# Patient Record
Sex: Female | Born: 1975 | Race: White | Hispanic: No | Marital: Married | Smoking: Current some day smoker
Health system: Southern US, Community
[De-identification: ages and names within clinical notes are randomized; demographics above are authoritative.]

## PROBLEM LIST (undated history)

## (undated) DIAGNOSIS — E119 Type 2 diabetes mellitus without complications: Secondary | ICD-10-CM

## (undated) DIAGNOSIS — Z87898 Personal history of other specified conditions: Secondary | ICD-10-CM

## (undated) DIAGNOSIS — I10 Essential (primary) hypertension: Secondary | ICD-10-CM

## (undated) DIAGNOSIS — B20 Human immunodeficiency virus [HIV] disease: Secondary | ICD-10-CM

## (undated) DIAGNOSIS — R7689 Other specified abnormal immunological findings in serum: Secondary | ICD-10-CM

## (undated) DIAGNOSIS — R519 Headache, unspecified: Secondary | ICD-10-CM

## (undated) DIAGNOSIS — Z21 Asymptomatic human immunodeficiency virus [HIV] infection status: Secondary | ICD-10-CM

## (undated) DIAGNOSIS — F32A Depression, unspecified: Secondary | ICD-10-CM

## (undated) DIAGNOSIS — R768 Other specified abnormal immunological findings in serum: Secondary | ICD-10-CM

## (undated) DIAGNOSIS — F329 Major depressive disorder, single episode, unspecified: Secondary | ICD-10-CM

## (undated) DIAGNOSIS — J3081 Allergic rhinitis due to animal (cat) (dog) hair and dander: Secondary | ICD-10-CM

## (undated) DIAGNOSIS — F419 Anxiety disorder, unspecified: Secondary | ICD-10-CM

## (undated) DIAGNOSIS — Z8632 Personal history of gestational diabetes: Secondary | ICD-10-CM

## (undated) DIAGNOSIS — T8859XA Other complications of anesthesia, initial encounter: Secondary | ICD-10-CM

## (undated) DIAGNOSIS — K589 Irritable bowel syndrome without diarrhea: Secondary | ICD-10-CM

## (undated) DIAGNOSIS — Z87442 Personal history of urinary calculi: Secondary | ICD-10-CM

## (undated) DIAGNOSIS — T7840XA Allergy, unspecified, initial encounter: Secondary | ICD-10-CM

## (undated) DIAGNOSIS — R51 Headache: Secondary | ICD-10-CM

## (undated) DIAGNOSIS — Z720 Tobacco use: Secondary | ICD-10-CM

## (undated) DIAGNOSIS — K056 Periodontal disease, unspecified: Secondary | ICD-10-CM

## (undated) DIAGNOSIS — K219 Gastro-esophageal reflux disease without esophagitis: Secondary | ICD-10-CM

## (undated) DIAGNOSIS — N200 Calculus of kidney: Secondary | ICD-10-CM

## (undated) DIAGNOSIS — K5900 Constipation, unspecified: Secondary | ICD-10-CM

## (undated) DIAGNOSIS — M1990 Unspecified osteoarthritis, unspecified site: Secondary | ICD-10-CM

## (undated) DIAGNOSIS — K8020 Calculus of gallbladder without cholecystitis without obstruction: Secondary | ICD-10-CM

## (undated) HISTORY — DX: Asymptomatic human immunodeficiency virus (hiv) infection status: Z21

## (undated) HISTORY — DX: Calculus of kidney: N20.0

## (undated) HISTORY — DX: Headache: R51

## (undated) HISTORY — DX: Other specified abnormal immunological findings in serum: R76.89

## (undated) HISTORY — DX: Allergic rhinitis due to animal (cat) (dog) hair and dander: J30.81

## (undated) HISTORY — PX: WISDOM TOOTH EXTRACTION: SHX21

## (undated) HISTORY — DX: Type 2 diabetes mellitus without complications: E11.9

## (undated) HISTORY — PX: TUBAL LIGATION: SHX77

## (undated) HISTORY — DX: Human immunodeficiency virus (HIV) disease: B20

## (undated) HISTORY — DX: Allergy, unspecified, initial encounter: T78.40XA

## (undated) HISTORY — DX: Headache, unspecified: R51.9

## (undated) HISTORY — DX: Personal history of other specified conditions: Z87.898

## (undated) HISTORY — DX: Personal history of gestational diabetes: Z86.32

## (undated) HISTORY — DX: Morbid (severe) obesity due to excess calories: E66.01

## (undated) HISTORY — DX: Constipation, unspecified: K59.00

## (undated) HISTORY — PX: INTRAUTERINE DEVICE INSERTION: SHX323

## (undated) HISTORY — DX: Other specified abnormal immunological findings in serum: R76.8

## (undated) HISTORY — PX: ABDOMINAL HYSTERECTOMY: SHX81

## (undated) HISTORY — DX: Tobacco use: Z72.0

## (undated) HISTORY — DX: Periodontal disease, unspecified: K05.6

## (undated) HISTORY — DX: Calculus of gallbladder without cholecystitis without obstruction: K80.20

---

## 2009-04-23 ENCOUNTER — Emergency Department (HOSPITAL_COMMUNITY): Admission: EM | Admit: 2009-04-23 | Discharge: 2009-04-23 | Payer: Self-pay | Admitting: Family Medicine

## 2011-07-22 ENCOUNTER — Encounter: Payer: Self-pay | Admitting: Obstetrics & Gynecology

## 2011-07-22 ENCOUNTER — Ambulatory Visit (INDEPENDENT_AMBULATORY_CARE_PROVIDER_SITE_OTHER): Payer: 59 | Admitting: Obstetrics & Gynecology

## 2011-07-22 VITALS — BP 117/93 | HR 96 | Ht 68.0 in | Wt 309.0 lb

## 2011-07-22 DIAGNOSIS — Z01419 Encounter for gynecological examination (general) (routine) without abnormal findings: Secondary | ICD-10-CM

## 2011-07-22 DIAGNOSIS — E118 Type 2 diabetes mellitus with unspecified complications: Secondary | ICD-10-CM | POA: Insufficient documentation

## 2011-07-22 DIAGNOSIS — Z124 Encounter for screening for malignant neoplasm of cervix: Secondary | ICD-10-CM

## 2011-07-22 DIAGNOSIS — Z113 Encounter for screening for infections with a predominantly sexual mode of transmission: Secondary | ICD-10-CM

## 2011-07-22 DIAGNOSIS — E1169 Type 2 diabetes mellitus with other specified complication: Secondary | ICD-10-CM | POA: Insufficient documentation

## 2011-07-22 DIAGNOSIS — E1165 Type 2 diabetes mellitus with hyperglycemia: Secondary | ICD-10-CM | POA: Insufficient documentation

## 2011-07-22 DIAGNOSIS — Z0000 Encounter for general adult medical examination without abnormal findings: Secondary | ICD-10-CM

## 2011-07-22 NOTE — Progress Notes (Signed)
Subjective:    Cassandra [name] is a 36 y.o. female who presents for an annual exam. The patient has no complaints today.  A recent sexual partner (while she was separated) has told her he has herpes. She would like tested for all STIs. The patient is sexually active. GYN screening history: last pap: was normal. The patient wears seatbelts: yes. The patient participates in regular exercise: no. Has the patient ever been transfused or tattooed?: not asked. The patient reports that there is not domestic violence in her life.   Menstrual History: OB History    Grav Para Term Preterm Abortions TAB SAB Ect Mult Living   2 2        2       Menarche age: 14 Patient's last menstrual period was 06/25/2011.    The following portions of the patient's history were reviewed and updated as appropriate: allergies, current medications, past family history, past medical history, past social history, past surgical history and problem list.  Review of Systems A comprehensive review of systems was negative. She works at [employer]. She reports monthly periods.   Objective:    BP 117/93  Pulse 96  Ht 5\' 8"  (1.727 m)  Wt 309 lb (140.161 kg)  BMI 46.98 kg/m2  LMP 06/25/2011  General Appearance:    Alert, cooperative, no distress, appears stated age  Head:    Normocephalic, without obvious abnormality, atraumatic  Eyes:    PERRL, conjunctiva/corneas clear, EOM's intact, fundi    benign, both eyes  Ears:    Normal TM's and external ear canals, both ears  Nose:   Nares normal, septum midline, mucosa normal, no drainage    or sinus tenderness  Throat:   Lips, mucosa, and tongue normal; teeth and gums normal  Neck:   Supple, symmetrical, trachea midline, no adenopathy;    thyroid:  no enlargement/tenderness/nodules; no carotid   bruit or JVD  Back:     Symmetric, no curvature, ROM normal, no CVA tenderness  Lungs:     Clear to auscultation bilaterally, respirations unlabored  Chest Wall:    No tenderness or  deformity   Heart:    Regular rate and rhythm, S1 and S2 normal, no murmur, rub   or gallop  Breast Exam:    No tenderness, masses, or nipple abnormality  Abdomen:     Soft, non-tender, bowel sounds active all four quadrants,    no masses, no organomegaly  Genitalia:    Normal female without lesion, discharge or tenderness, NSSA, NT, no adnexal masses     Extremities:   Extremities normal, atraumatic, no cyanosis or edema  Pulses:   2+ and symmetric all extremities  Skin:   Skin color, texture, turgor normal, no rashes or lesions  Lymph nodes:   Cervical, supraclavicular, and axillary nodes normal  Neurologic:   CNII-XII intact, normal strength, sensation and reflexes    throughout  .    Assessment:    Healthy female exam.    Plan:     Pap smear.  She has agreed to lose 10 # by next annual visit and cut her cigarettes down to 3 cig per day by then. I will get fasting labs tomorrow plus her STI labs.

## 2011-07-23 ENCOUNTER — Other Ambulatory Visit (INDEPENDENT_AMBULATORY_CARE_PROVIDER_SITE_OTHER): Payer: 59 | Admitting: *Deleted

## 2011-07-23 DIAGNOSIS — Z13220 Encounter for screening for lipoid disorders: Secondary | ICD-10-CM

## 2011-07-23 DIAGNOSIS — Z113 Encounter for screening for infections with a predominantly sexual mode of transmission: Secondary | ICD-10-CM

## 2011-07-23 DIAGNOSIS — Z01419 Encounter for gynecological examination (general) (routine) without abnormal findings: Secondary | ICD-10-CM

## 2011-07-23 DIAGNOSIS — E669 Obesity, unspecified: Secondary | ICD-10-CM

## 2011-07-24 LAB — HEPATITIS C ANTIBODY: HCV Ab: NEGATIVE

## 2011-07-24 LAB — CBC WITH DIFFERENTIAL/PLATELET
Basophils Absolute: 0 10*3/uL (ref 0.0–0.1)
Basophils Relative: 0 % (ref 0–1)
Eosinophils Absolute: 0.2 10*3/uL (ref 0.0–0.7)
Eosinophils Relative: 2 % (ref 0–5)
HCT: 40.3 % (ref 36.0–46.0)
Hemoglobin: 12.7 g/dL (ref 12.0–15.0)
Lymphocytes Relative: 33 % (ref 12–46)
Lymphs Abs: 2.2 10*3/uL (ref 0.7–4.0)
MCH: 30 pg (ref 26.0–34.0)
MCHC: 31.5 g/dL (ref 30.0–36.0)
MCV: 95 fL (ref 78.0–100.0)
Monocytes Absolute: 0.5 10*3/uL (ref 0.1–1.0)
Monocytes Relative: 8 % (ref 3–12)
Neutro Abs: 3.7 10*3/uL (ref 1.7–7.7)
Neutrophils Relative %: 56 % (ref 43–77)
Platelets: 310 10*3/uL (ref 150–400)
RBC: 4.24 MIL/uL (ref 3.87–5.11)
RDW: 12.4 % (ref 11.5–15.5)
WBC: 6.6 10*3/uL (ref 4.0–10.5)

## 2011-07-24 LAB — HEPATITIS B SURFACE ANTIGEN: Hepatitis B Surface Ag: NEGATIVE

## 2011-07-24 LAB — LIPID PANEL
Cholesterol: 168 mg/dL (ref 0–200)
HDL: 43 mg/dL (ref >39)
LDL Cholesterol: 103 mg/dL — ABNORMAL HIGH (ref 0–99)
Total CHOL/HDL Ratio: 3.9 Ratio
Triglycerides: 109 mg/dL (ref <150)
VLDL: 22 mg/dL (ref 0–40)

## 2011-07-24 LAB — COMPREHENSIVE METABOLIC PANEL
ALT: 14 U/L (ref 0–35)
AST: 19 U/L (ref 0–37)
Albumin: 4 g/dL (ref 3.5–5.2)
Alkaline Phosphatase: 62 U/L (ref 39–117)
BUN: 13 mg/dL (ref 6–23)
CO2: 26 mEq/L (ref 19–32)
Calcium: 8.9 mg/dL (ref 8.4–10.5)
Chloride: 105 mEq/L (ref 96–112)
Creat: 0.79 mg/dL (ref 0.50–1.10)
Glucose, Bld: 107 mg/dL — ABNORMAL HIGH (ref 70–99)
Potassium: 3.9 mEq/L (ref 3.5–5.3)
Sodium: 140 mEq/L (ref 135–145)
Total Bilirubin: 0.8 mg/dL (ref 0.3–1.2)
Total Protein: 6.7 g/dL (ref 6.0–8.3)

## 2011-07-24 LAB — HIV ANTIBODY (ROUTINE TESTING W REFLEX): HIV: NONREACTIVE

## 2011-07-24 LAB — RPR

## 2011-07-24 LAB — TSH: TSH: 1.449 u[IU]/mL (ref 0.350–4.500)

## 2011-07-24 LAB — HSV 2 ANTIBODY, IGG: HSV 2 Glycoprotein G Ab, IgG: 2.77 IV — ABNORMAL HIGH

## 2011-07-27 ENCOUNTER — Telehealth: Payer: Self-pay | Admitting: *Deleted

## 2011-07-27 NOTE — Telephone Encounter (Signed)
Patient is notified of her results.

## 2011-08-04 ENCOUNTER — Telehealth: Payer: Self-pay | Admitting: *Deleted

## 2011-08-04 MED ORDER — METRONIDAZOLE 500 MG PO TABS: 500.0000 mg | ORAL_TABLET | Freq: Three times a day (TID) | ORAL | Status: AC

## 2011-08-04 NOTE — Telephone Encounter (Signed)
Message copied by Barbara Cower on Tue Aug 04, 2011  2:41 PM ------      Message from: Nicholaus Bloom C      Created: Wed Jul 29, 2011  2:57 PM       She and partner need treatment for trich with flagyl 500 mg po TID for a week.

## 2011-08-07 DIAGNOSIS — K056 Periodontal disease, unspecified: Secondary | ICD-10-CM | POA: Insufficient documentation

## 2011-08-07 HISTORY — DX: Periodontal disease, unspecified: K05.6

## 2011-09-02 ENCOUNTER — Ambulatory Visit (INDEPENDENT_AMBULATORY_CARE_PROVIDER_SITE_OTHER): Payer: 59 | Admitting: Family Medicine

## 2011-09-02 ENCOUNTER — Encounter: Payer: Self-pay | Admitting: Family Medicine

## 2011-09-02 VITALS — BP 144/98 | HR 88 | Temp 98.6°F | Ht 69.0 in | Wt 314.5 lb

## 2011-09-02 DIAGNOSIS — F39 Unspecified mood [affective] disorder: Secondary | ICD-10-CM

## 2011-09-02 DIAGNOSIS — F331 Major depressive disorder, recurrent, moderate: Secondary | ICD-10-CM | POA: Insufficient documentation

## 2011-09-02 DIAGNOSIS — K219 Gastro-esophageal reflux disease without esophagitis: Secondary | ICD-10-CM

## 2011-09-02 DIAGNOSIS — Z87891 Personal history of nicotine dependence: Secondary | ICD-10-CM | POA: Insufficient documentation

## 2011-09-02 DIAGNOSIS — R7309 Other abnormal glucose: Secondary | ICD-10-CM

## 2011-09-02 DIAGNOSIS — J3081 Allergic rhinitis due to animal (cat) (dog) hair and dander: Secondary | ICD-10-CM

## 2011-09-02 DIAGNOSIS — F17200 Nicotine dependence, unspecified, uncomplicated: Secondary | ICD-10-CM

## 2011-09-02 DIAGNOSIS — J3089 Other allergic rhinitis: Secondary | ICD-10-CM

## 2011-09-02 DIAGNOSIS — R739 Hyperglycemia, unspecified: Secondary | ICD-10-CM

## 2011-09-02 DIAGNOSIS — Z6841 Body Mass Index (BMI) 40.0 and over, adult: Secondary | ICD-10-CM | POA: Insufficient documentation

## 2011-09-02 DIAGNOSIS — K069 Disorder of gingiva and edentulous alveolar ridge, unspecified: Secondary | ICD-10-CM

## 2011-09-02 DIAGNOSIS — B360 Pityriasis versicolor: Secondary | ICD-10-CM | POA: Insufficient documentation

## 2011-09-02 DIAGNOSIS — Z720 Tobacco use: Secondary | ICD-10-CM

## 2011-09-02 DIAGNOSIS — K056 Periodontal disease, unspecified: Secondary | ICD-10-CM

## 2011-09-02 DIAGNOSIS — R21 Rash and other nonspecific skin eruption: Secondary | ICD-10-CM

## 2011-09-02 LAB — HEMOGLOBIN A1C: Hgb A1c MFr Bld: 5.5 % (ref 4.6–6.5)

## 2011-09-02 MED ORDER — ALBUTEROL SULFATE HFA 108 (90 BASE) MCG/ACT IN AERS: 2.0000 | INHALATION_SPRAY | Freq: Four times a day (QID) | RESPIRATORY_TRACT | Status: DC | PRN

## 2011-09-02 MED ORDER — FLUCONAZOLE 150 MG PO TABS: 300.0000 mg | ORAL_TABLET | ORAL | Status: AC

## 2011-09-02 NOTE — Patient Instructions (Addendum)
For heartburn, try zantac or pepcid over the counter.  If not better, return to see me. Pass by Marion's office to schedule appointment with Dr. Laymond Purser (our psychologist). A1c today. I sent albuterol to your pharmacy - use as needed. For skin rash - take dfilucan 300mg  weekly for 2 weeks.  If not better after a few months let us know. Return as needed. Good to see you today, call us with questions.

## 2011-09-02 NOTE — Assessment & Plan Note (Signed)
Continue to encourage cessation.   Down to 5cig/day. Contemplative.

## 2011-09-02 NOTE — Assessment & Plan Note (Signed)
rec try H2 blockers.  If not improved, return for further evaluation.  Consider H pylori check and trial of PPI.

## 2011-09-02 NOTE — Assessment & Plan Note (Addendum)
No anhedonia.  major depression vs dysthymia. Hold on pharmacotherapy currently. PHQ9 = 18/27, very difficult to function in day to day activities. rec referral for CBT to work on healthy coping strategies for stress as well as to further discuss stressors.  Pt agrees. Will refer to Dr. Laymond Purser for this.

## 2011-09-02 NOTE — Assessment & Plan Note (Signed)
Body mass index is 46.44 kg/(m^2). Encouraged continued activity to achieve weight loss.

## 2011-09-02 NOTE — Progress Notes (Signed)
Subjective:    Patient ID: Cassandra [name], female    DOB: Apr 03, 1976, 36 y.o.   MRN: [id number]  HPI CC: new pt establish  Rash - skin rash over last year - not itchy or painful.  Worse in summer (more pronounced).  On AV fossa bilateral arms, chest, below breasts.  Notes that when uses lotion seems to improve temporarily.  Heartburn - takes tums for this.  Occasional vomiting with this, sometimes feels bloated in abd.  Tried prilosec in past which helped some.  Mood issues - has been going on for years.  has never seeked counseling.  Doesn't have healthy ways of coping with stress.  Some sleep disturbances.  Denies anhedonia.  Energy level down.  Concentration ok.  Some guilt.  Appetite up and down.  Denies SI/HI.  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  Poor eating habits.  Tries to stay active at work some.  Smoking - 5 cig/day.  Preventative: Well woman with Dr. Marice Potter - 07/2011.  Normal pap smear.  Treated for trich. Flu - did not receive Tetanus - unsure - thinks ~2005.  Medications and allergies reviewed and updated in chart.  Past histories reviewed and updated if relevant as below. Patient Active Problem List  Diagnoses  . Diabetes in pregnancy  . Mood disorder  . Skin rash   Past Medical History  Diagnosis Date  . Generalized headaches     frequent  . Gestational diabetes   . History of abnormal Pap smear     remote  . Morbid obesity   . Herpes simplex type 2 infection   . Trichomonas     treated  . Cat allergies     and dogs  . Tobacco use   . Periodontal disease 08/2011    currently getting dental work   Past Surgical History  Procedure Date  . Cesarean section [id number]    x2  . Wisdom tooth extraction     x 4   History  Substance Use Topics  . Smoking status: Current Everyday Smoker -- 0.3 packs/day    Types: Cigarettes  . Smokeless tobacco: Never Used   Comment: ~ 5 cigarettes daily  . Alcohol Use: Yes     rare   Family History  Problem  Relation Age of Onset  . Hypertension Mother   . Hypertension Father   . Diabetes Maternal Grandmother     s/p amputation  . Hyperlipidemia Maternal Grandmother   . Hypertension Maternal Grandmother   . Stroke Maternal Grandmother   . Cancer Maternal Grandfather     skin cancer  . Hyperlipidemia Maternal Grandfather   . Hypertension Maternal Grandfather   . Seizures Paternal Grandmother 34    deceased  . Coronary artery disease Paternal Grandfather 35   No Known Allergies Current Outpatient Prescriptions on File Prior to Visit  Medication Sig Dispense Refill  . Fexofenadine HCl (ALLEGRA PO) Take by mouth.      . Loratadine (CLARITIN PO) Take by mouth.         Review of Systems  Constitutional: Negative for fever, chills, activity change, appetite change, fatigue and unexpected weight change.  HENT: Negative for hearing loss and neck pain.   Eyes: Negative for visual disturbance.  Respiratory: Negative for cough, chest tightness, shortness of breath and wheezing.   Cardiovascular: Negative for chest pain, palpitations and leg swelling.  Gastrointestinal: Positive for constipation. Negative for nausea, vomiting, abdominal pain, diarrhea and blood in stool.  Genitourinary: Negative for hematuria  and difficulty urinating.  Musculoskeletal: Negative for myalgias and arthralgias.  Skin: Negative for rash.  Neurological: Positive for headaches. Negative for dizziness, seizures and syncope.  Hematological: Does not bruise/bleed easily.  Psychiatric/Behavioral: Negative for dysphoric mood. The patient is not nervous/anxious.        Objective:   Physical Exam  Nursing note and vitals reviewed. Constitutional: She is oriented to person, place, and time. She appears well-developed and well-nourished. No distress.       obese  HENT:  Head: Normocephalic and atraumatic.  Right Ear: External ear normal.  Left Ear: External ear normal.  Nose: Nose normal.  Mouth/Throat: Oropharynx is  clear and moist.  Eyes: Conjunctivae and EOM are normal. Pupils are equal, round, and reactive to light.  Neck: Normal range of motion. Neck supple. No thyromegaly present.  Cardiovascular: Normal rate, regular rhythm, normal heart sounds and intact distal pulses.   No murmur heard. Pulses:      Radial pulses are 2+ on the right side, and 2+ on the left side.  Pulmonary/Chest: Effort normal and breath sounds normal. No respiratory distress. She has no wheezes. She has no rales.  Abdominal: Soft. Bowel sounds are normal. She exhibits no distension and no mass. There is no tenderness. There is no rebound and no guarding.  Musculoskeletal: Normal range of motion.  Lymphadenopathy:    She has no cervical adenopathy.  Neurological: She is alert and oriented to person, place, and time.       CN grossly intact, station and gait intact  Skin: Skin is warm and dry. Rash noted.          Hyperpigmented macules on AC fossa and below breasts bilaterally Erythematous pigmented macules anterior chest  Psychiatric: Her behavior is normal. Judgment and thought content normal.       Easily tearful with discussion of stressors.       Assessment & Plan:

## 2011-09-02 NOTE — Assessment & Plan Note (Addendum)
Anticipate 2 separate issues: 1. Tinea versicolor - treat with diflucan 300mg  weekly x2 wks. 2. Acanthosis nigricans - check A1c today.  hyperglycemia last fasting check 07/2011.

## 2011-09-23 ENCOUNTER — Ambulatory Visit (INDEPENDENT_AMBULATORY_CARE_PROVIDER_SITE_OTHER): Payer: PRIVATE HEALTH INSURANCE | Admitting: Psychology

## 2011-09-23 DIAGNOSIS — F331 Major depressive disorder, recurrent, moderate: Secondary | ICD-10-CM

## 2011-10-14 ENCOUNTER — Ambulatory Visit (INDEPENDENT_AMBULATORY_CARE_PROVIDER_SITE_OTHER): Payer: PRIVATE HEALTH INSURANCE | Admitting: Psychology

## 2011-10-14 DIAGNOSIS — F331 Major depressive disorder, recurrent, moderate: Secondary | ICD-10-CM

## 2011-10-20 ENCOUNTER — Ambulatory Visit: Payer: PRIVATE HEALTH INSURANCE | Admitting: Psychology

## 2011-10-21 ENCOUNTER — Ambulatory Visit (INDEPENDENT_AMBULATORY_CARE_PROVIDER_SITE_OTHER): Payer: PRIVATE HEALTH INSURANCE | Admitting: Psychology

## 2011-10-21 DIAGNOSIS — F331 Major depressive disorder, recurrent, moderate: Secondary | ICD-10-CM

## 2011-11-11 ENCOUNTER — Ambulatory Visit (INDEPENDENT_AMBULATORY_CARE_PROVIDER_SITE_OTHER): Payer: PRIVATE HEALTH INSURANCE | Admitting: Psychology

## 2011-11-11 DIAGNOSIS — F331 Major depressive disorder, recurrent, moderate: Secondary | ICD-10-CM

## 2011-12-02 ENCOUNTER — Ambulatory Visit (INDEPENDENT_AMBULATORY_CARE_PROVIDER_SITE_OTHER): Payer: PRIVATE HEALTH INSURANCE | Admitting: Psychology

## 2011-12-02 DIAGNOSIS — F331 Major depressive disorder, recurrent, moderate: Secondary | ICD-10-CM

## 2011-12-16 ENCOUNTER — Ambulatory Visit (INDEPENDENT_AMBULATORY_CARE_PROVIDER_SITE_OTHER): Payer: PRIVATE HEALTH INSURANCE | Admitting: Psychology

## 2011-12-16 DIAGNOSIS — F331 Major depressive disorder, recurrent, moderate: Secondary | ICD-10-CM

## 2011-12-30 ENCOUNTER — Ambulatory Visit (INDEPENDENT_AMBULATORY_CARE_PROVIDER_SITE_OTHER): Payer: PRIVATE HEALTH INSURANCE | Admitting: Psychology

## 2011-12-30 DIAGNOSIS — F331 Major depressive disorder, recurrent, moderate: Secondary | ICD-10-CM

## 2012-01-01 ENCOUNTER — Ambulatory Visit (INDEPENDENT_AMBULATORY_CARE_PROVIDER_SITE_OTHER): Payer: PRIVATE HEALTH INSURANCE | Admitting: Family Medicine

## 2012-01-01 ENCOUNTER — Encounter: Payer: Self-pay | Admitting: Family Medicine

## 2012-01-01 VITALS — BP 120/80 | HR 87 | Temp 98.0°F | Wt 318.0 lb

## 2012-01-01 DIAGNOSIS — Z20828 Contact with and (suspected) exposure to other viral communicable diseases: Secondary | ICD-10-CM

## 2012-01-01 DIAGNOSIS — Z206 Contact with and (suspected) exposure to human immunodeficiency virus [HIV]: Secondary | ICD-10-CM

## 2012-01-01 DIAGNOSIS — I872 Venous insufficiency (chronic) (peripheral): Secondary | ICD-10-CM | POA: Insufficient documentation

## 2012-01-01 DIAGNOSIS — I8390 Asymptomatic varicose veins of unspecified lower extremity: Secondary | ICD-10-CM

## 2012-01-01 NOTE — Progress Notes (Signed)
Subjective:    Patient ID: Cassandra [name], female    DOB: 01-Sep-1975, 36 y.o.   MRN: [id number]  HPI CC: HIV testing  Smoking - still about 5 cig/day.  Varicose veins - would like compression stockings.  Present since last child.  Last seen 08/2011.  After seen here in March, husband tested positive for HIV.  He is seeing Dr. Leavy Cella.  Taking atripla, nondetectable level of viral load.  CD4 from 100s to 300s.  Currently not sexually active with husband.  Last unprotected sex end of February.  Pt tested for STDs in February at [facility], negative HIV test.  HSV2 positive.  Married for 14 yrs.  Husband in bad wreck 1995, lots of transfusions.  Thinks got it here.  Denies fevers/chills, new rashes, vag discharge or bleeding, urethral discharge.    Medications and allergies reviewed and updated in chart.  Past histories reviewed and updated if relevant as below. Patient Active Problem List  Diagnosis  . Diabetes in pregnancy  . Mood disorder  . Skin rash  . Morbid obesity  . Tobacco use  . Periodontal disease  . Cat allergies  . Reflux   Past Medical History  Diagnosis Date  . Generalized headaches     frequent  . History of gestational diabetes     first 2 pregnancies  . History of abnormal Pap smear     remote  . Morbid obesity   . HSV-2 seropositive   . Cat allergies     and dogs  . Tobacco use   . Periodontal disease 08/2011    currently getting dental work   Past Surgical History  Procedure Date  . Cesarean section [phone]    x2  . Wisdom tooth extraction     x 4   History  Substance Use Topics  . Smoking status: Current Everyday Smoker -- 0.3 packs/day    Types: Cigarettes  . Smokeless tobacco: Never Used   Comment: ~ 5 cigarettes daily  . Alcohol Use: Yes     rare   Family History  Problem Relation Age of Onset  . Hypertension Mother   . Hypertension Father   . Diabetes Maternal Grandmother     s/p amputation  . Hyperlipidemia Maternal  Grandmother   . Hypertension Maternal Grandmother   . Stroke Maternal Grandmother   . Cancer Maternal Grandfather     skin cancer  . Hyperlipidemia Maternal Grandfather   . Hypertension Maternal Grandfather   . Seizures Paternal Grandmother 34    deceased  . Coronary artery disease Paternal Grandfather 35   No Known Allergies Current Outpatient Prescriptions on File Prior to Visit  Medication Sig Dispense Refill  . albuterol (VENTOLIN HFA) 108 (90 BASE) MCG/ACT inhaler Inhale 2 puffs into the lungs every 6 (six) hours as needed for wheezing.  1 Inhaler  3  . Fexofenadine HCl (ALLEGRA PO) Take by mouth.      . Loratadine (CLARITIN PO) Take by mouth.      . Multiple Vitamins-Minerals (MULTIVITAMIN PO) Take 1 tablet by mouth daily.      Marland Kitchen omeprazole (PRILOSEC OTC) 20 MG tablet Take 20 mg by mouth 2 (two) times daily.         Review of Systems Per HPI    Objective:   Physical Exam  Nursing note and vitals reviewed. Constitutional: She appears well-developed and well-nourished. No distress.       obese  Skin: Skin is warm and dry. No rash noted.  Varicose veins present with small spider veins surrounding bilateral upper legs      Assessment & Plan:

## 2012-01-01 NOTE — Assessment & Plan Note (Signed)
husband dx this year with HIV.  Pt wants testing.  Discussed usually best time to test is 6 mo after last known exposure. Consider retesting at [facility]. Ok to leave message on her phone (in pt instructions.)

## 2012-01-01 NOTE — Patient Instructions (Signed)
We will call you at [phone] and may leave voicemail with results. Try compression stockings for varicose veins.  Let us know if not helping, may refer to vein doctor.  Varicose Veins Varicose veins are veins that have become enlarged and twisted. CAUSES This condition is the result of valves in the veins not working properly. Valves in the veins help return blood from the leg to the heart. If these valves are damaged, blood flows backwards and backs up into the veins in the leg near the skin. This causes the veins to become larger. People who are on their feet a lot, who are pregnant, or who are overweight are more likely to develop varicose veins. SYMPTOMS   Bulging, twisted-appearing, bluish veins, most commonly found on the legs.   Leg pain or a feeling of heaviness. These symptoms may be worse at the end of the day.   Leg swelling.   Skin color changes.  DIAGNOSIS  Varicose veins can usually be diagnosed with an exam of your legs by your caregiver. He or she may recommend an ultrasound of your leg veins. TREATMENT  Most varicose veins can be treated at home.However, other treatments are available for people who have persistent symptoms or who want to treat the cosmetic appearance of the varicose veins. These include:  Laser treatment of very small varicose veins.   Medicine that is shot (injected) into the vein. This medicine hardens the walls of the vein and closes off the vein. This treatment is called sclerotherapy. Afterwards, you may need to wear clothing or bandages that apply pressure.   Surgery.  HOME CARE INSTRUCTIONS   Do not stand or sit in one position for long periods of time. Do not sit with your legs crossed. Rest with your legs raised during the day.   Wear elastic stockings or support hose. Do not wear other tight, encircling garments around the legs, pelvis, or waist.   Walk as much as possible to increase blood flow.   Raise the foot of your bed at night  with 2-inch blocks.   If you get a cut in the skin over the vein and the vein bleeds, lie down with your leg raised and press on it with a clean cloth until the bleeding stops. Then place a bandage (dressing) on the cut. See your caregiver if it continues to bleed or needs stitches.  SEEK MEDICAL CARE IF:   The skin around your ankle starts to break down.   You have pain, redness, tenderness, or hard swelling developing in your leg over a vein.   You are uncomfortable due to leg pain.  Document Released: 03/04/2005 Document Revised: 05/14/2011 Document Reviewed: 07/21/2010 [facility] Patient Information 2012 [place], [place].

## 2012-01-01 NOTE — Assessment & Plan Note (Signed)
Provided with script for 15-62mmHg compression stockings. To update if worsening, consider [facility] referral.

## 2012-01-02 LAB — HIV ANTIBODY (ROUTINE TESTING W REFLEX): HIV: REACTIVE

## 2012-01-04 ENCOUNTER — Other Ambulatory Visit: Payer: Self-pay | Admitting: Family Medicine

## 2012-01-04 DIAGNOSIS — B20 Human immunodeficiency virus [HIV] disease: Secondary | ICD-10-CM | POA: Insufficient documentation

## 2012-01-05 ENCOUNTER — Telehealth: Payer: Self-pay | Admitting: *Deleted

## 2012-01-05 ENCOUNTER — Telehealth: Payer: Self-pay

## 2012-01-05 ENCOUNTER — Other Ambulatory Visit: Payer: Self-pay | Admitting: Licensed Clinical Social Worker

## 2012-01-05 NOTE — Telephone Encounter (Signed)
Pt informed primary care physician she would like to see Dr Blocker for her HIV care.    Laurell Josephs, RN

## 2012-01-05 NOTE — Telephone Encounter (Signed)
Spoke with Tammy from ID. She said the labs that you ordered are not necessary prior to being seen by ID. She said some of them are what they will draw, but some of them are not and that it would be better to cancel them and let them handle the lab orders once they get her established there. They are also waiting on the [place] blot before scheduling appt with Shirlee Limerick and patient. Do you want me to go ahead and cancel her lab appt tomorrow or keep her on the schedule?

## 2012-01-05 NOTE — Telephone Encounter (Signed)
May cancel appointment.

## 2012-01-05 NOTE — Telephone Encounter (Signed)
I spoke with Dr. Nicanor Alcon nurse. We must await Western Blot prior to making referral to ID official.  Dr Sharen Hones has entered future labs for patient, I have asked for those labs to be cancelled since we will draw our specialty labs at the intake visit. This will avoid duplication of services and additional cost to patient.   Once the Western Blot returns as positive, I will call the patient and set up new 042 intake and office visit with ID physician.    Shirlee Limerick has entered the referral in the work queue which I will accept upon scheduling the patient for visit.    Gadiel John K Johnston Maddocks,RN

## 2012-01-05 NOTE — Telephone Encounter (Signed)
Appt cancelled. Patient requested [facility] appt with Dr. Leavy Cella in [place] for convenience purposes. Her husband goes there and it is closer to home. Marion notified and will cancel [facility] appt.

## 2012-01-06 ENCOUNTER — Other Ambulatory Visit: Payer: PRIVATE HEALTH INSURANCE

## 2012-01-07 LAB — HIV 1/2 CONFIRMATION
HIV-1 antibody: POSITIVE — AB
HIV-2 Ab: NEGATIVE

## 2012-01-13 ENCOUNTER — Ambulatory Visit (INDEPENDENT_AMBULATORY_CARE_PROVIDER_SITE_OTHER): Payer: PRIVATE HEALTH INSURANCE | Admitting: Psychology

## 2012-01-13 ENCOUNTER — Telehealth: Payer: Self-pay | Admitting: *Deleted

## 2012-01-13 DIAGNOSIS — F331 Major depressive disorder, recurrent, moderate: Secondary | ICD-10-CM

## 2012-01-13 NOTE — Telephone Encounter (Signed)
Noted. Thanks.  I did review Dr. Sharrell Ku note as well.

## 2012-01-13 NOTE — Telephone Encounter (Signed)
Patient called to let you know she saw Dr. Leavy Cella 2 days ago and was started on Atripla.

## 2012-01-27 ENCOUNTER — Ambulatory Visit (INDEPENDENT_AMBULATORY_CARE_PROVIDER_SITE_OTHER): Payer: PRIVATE HEALTH INSURANCE | Admitting: Psychology

## 2012-01-27 DIAGNOSIS — F331 Major depressive disorder, recurrent, moderate: Secondary | ICD-10-CM

## 2012-02-10 ENCOUNTER — Ambulatory Visit (INDEPENDENT_AMBULATORY_CARE_PROVIDER_SITE_OTHER): Payer: PRIVATE HEALTH INSURANCE | Admitting: Psychology

## 2012-02-10 DIAGNOSIS — F331 Major depressive disorder, recurrent, moderate: Secondary | ICD-10-CM

## 2012-03-02 ENCOUNTER — Ambulatory Visit: Payer: Self-pay | Admitting: Psychology

## 2012-04-06 ENCOUNTER — Encounter: Payer: Self-pay | Admitting: Family Medicine

## 2012-04-06 ENCOUNTER — Ambulatory Visit (INDEPENDENT_AMBULATORY_CARE_PROVIDER_SITE_OTHER): Payer: 59 | Admitting: Family Medicine

## 2012-04-06 ENCOUNTER — Ambulatory Visit (INDEPENDENT_AMBULATORY_CARE_PROVIDER_SITE_OTHER): Payer: PRIVATE HEALTH INSURANCE | Admitting: Psychology

## 2012-04-06 VITALS — BP 142/98 | HR 84 | Temp 97.9°F | Wt 313.8 lb

## 2012-04-06 DIAGNOSIS — F17200 Nicotine dependence, unspecified, uncomplicated: Secondary | ICD-10-CM

## 2012-04-06 DIAGNOSIS — J019 Acute sinusitis, unspecified: Secondary | ICD-10-CM

## 2012-04-06 DIAGNOSIS — Z720 Tobacco use: Secondary | ICD-10-CM

## 2012-04-06 DIAGNOSIS — B360 Pityriasis versicolor: Secondary | ICD-10-CM

## 2012-04-06 DIAGNOSIS — F331 Major depressive disorder, recurrent, moderate: Secondary | ICD-10-CM

## 2012-04-06 DIAGNOSIS — B20 Human immunodeficiency virus [HIV] disease: Secondary | ICD-10-CM

## 2012-04-06 DIAGNOSIS — Z21 Asymptomatic human immunodeficiency virus [HIV] infection status: Secondary | ICD-10-CM

## 2012-04-06 MED ORDER — AZITHROMYCIN 250 MG PO TABS: ORAL_TABLET | ORAL | Status: DC

## 2012-04-06 MED ORDER — FLUCONAZOLE 150 MG PO TABS: 300.0000 mg | ORAL_TABLET | ORAL | Status: DC

## 2012-04-06 MED ORDER — ALBUTEROL SULFATE HFA 108 (90 BASE) MCG/ACT IN AERS: 2.0000 | INHALATION_SPRAY | Freq: Four times a day (QID) | RESPIRATORY_TRACT | Status: DC | PRN

## 2012-04-06 NOTE — Assessment & Plan Note (Signed)
Treat with diflucan 300mg  weekly for 2 weeks.  Discussed use of antifungal shampoo as body wash once every few weeks to prevent recurrence.

## 2012-04-06 NOTE — Assessment & Plan Note (Signed)
Great numbers as of last check this month.

## 2012-04-06 NOTE — Patient Instructions (Addendum)
Let's keep an eye on blood pressure - keep track of it at local store.  If blood pressure staying >140/90, please let me know For tinea versicolor - treat with diflucan.  Then start antifungal shampoo as body wash once every 2-4 weeks to help prevent recurrence For upper respiratory infection - this may still be viral or possibly a developing sinusitis.  Treat with plenty of fluid, rest, continue tussin.  If not improving as expected or persistent past end of week, or worsening productive cough, fill antibiotic. Let us know if not improving as expected.  Tinea Versicolor Tinea versicolor is a common yeast infection of the skin. This condition becomes known when the yeast on our skin starts to overgrow (yeast is a normal inhabitant on our skin). This condition is noticed as white or light brown patches on brown skin, and is more evident in the summer on tanned skin. These areas are slightly scaly if scratched. The light patches from the yeast become evident when the yeast creates "holes in your suntan". This is most often noticed in the summer. The patches are usually located on the chest, back, pubis, neck and body folds. However, it may occur on any area of body. Mild itching and inflammation (redness or soreness) may be present. DIAGNOSIS  The diagnosisof this is made clinically (by looking). Cultures from samples are usually not needed. Examination under the microscope may help. However, yeast is normally found on skin. The diagnosis still remains clinical. Examination under Wood's Ultraviolet Light can determine the extent of the infection. TREATMENT  This common infection is usually only of cosmetic (only a concern to your appearance). It is easily treated with dandruff shampoo used during showers or bathing. Vigorous scrubbing will eliminate the yeast over several days time. The light areas in your skin may remain for weeks or months after the infection is cured unless your skin is exposed to  sunlight. The lighter or darker spots caused by the fungus that remain after complete treatment are not a sign of treatment failure; it will take a long time to resolve. Your caregiver may recommend a number of commercial preparations or medication by mouth if home care is not working. Recurrence is common and preventative medication may be necessary. This skin condition is not highly contagious. Special care is not needed to protect close friends and family members. Normal hygiene is usually enough. Follow up is required only if you develop complications (such as a secondary infection from scratching), if recommended by your caregiver, or if no relief is obtained from the preparations used. Document Released: 05/22/2000 Document Revised: 08/17/2011 Document Reviewed: 07/04/2008 [facility] Patient Information 2013 [place], [place].

## 2012-04-06 NOTE — Progress Notes (Signed)
  Subjective:    Patient ID: Cassandra [name], female    DOB: Aug 27, 1975, 36 y.o.   MRN: [id number]  HPI CC: congestion  HIV + pt with 1 wk h/o sinus congestion, coughing productive of green phlegm, HA today.  Started with ST.  More congested in head than chest.  Feels cough is coming from significant PNDrainage.  No fevers/chills, night sweats, ear or tooth pain, abd pain, n/v.  Tried allergy medicine which didn't really help.  Taking OTC tussin as well.  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 Smoking 5 cig/day. H/o mild seasonal allergic rhinitis.  No formal dx asthma but occasionally uses albuterol inhaler.  Skin rash returning for last 2 wks - h/o tinea versicolor treated successfully in past with diflucan.  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  Sees Dr. Leavy Cella.  Latest CD4 count 1004.  Viral load undetectable.  Taking atripla.  Last blood work 03/2012.  Past Medical History  Diagnosis Date  . Generalized headaches     frequent  . History of gestational diabetes     first 2 pregnancies  . History of abnormal Pap smear     remote  . Morbid obesity   . HSV-2 seropositive   . Cat allergies     and dogs  . Tobacco use   . Periodontal disease 08/2011    currently getting dental work   Review of Systems Per HPI    Objective:   Physical Exam  Nursing note and vitals reviewed. Constitutional: She appears well-developed and well-nourished. No distress.  HENT:  Head: Normocephalic and atraumatic.  Right Ear: Hearing, tympanic membrane, external ear and ear canal normal.  Left Ear: Hearing, tympanic membrane, external ear and ear canal normal.  Nose: No mucosal edema or rhinorrhea. Right sinus exhibits frontal sinus tenderness. Right sinus exhibits no maxillary sinus tenderness. Left sinus exhibits frontal sinus tenderness. Left sinus exhibits no maxillary sinus tenderness.  Mouth/Throat: Uvula is midline, oropharynx is clear and  moist and mucous membranes are normal. No oropharyngeal exudate, posterior oropharyngeal edema, posterior oropharyngeal erythema or tonsillar abscesses.       Pale turbinates  Eyes: Conjunctivae normal and EOM are normal. Pupils are equal, round, and reactive to light. No scleral icterus.  Neck: Normal range of motion. Neck supple.  Cardiovascular: Normal rate, regular rhythm, normal heart sounds and intact distal pulses.   No murmur heard. Pulmonary/Chest: Effort normal and breath sounds normal. No respiratory distress. She has no wheezes. She has no rales.       Lungs clear  Lymphadenopathy:    She has no cervical adenopathy.  Skin: Skin is warm and dry. No rash noted.          Hyperpigmented macular, some erythematous, rash upper trunk and arms, some scaly.       Assessment & Plan:

## 2012-04-06 NOTE — Assessment & Plan Note (Signed)
Continue to encourage cessation.  Down to 3-5 cig/day.  Contemplative.

## 2012-04-06 NOTE — Assessment & Plan Note (Signed)
Anticipate acute viral sinusitis given short duration. Provided with [facility] script for zpack to cover sinusitis/bronchitis in this HIV pt and advised her to fill of any worsening or if not improving each day starting today (day 7 of illness). Continue supportive care as per instructions.

## 2012-04-27 ENCOUNTER — Ambulatory Visit: Payer: Self-pay | Admitting: Psychology

## 2012-08-24 ENCOUNTER — Ambulatory Visit: Payer: Self-pay | Admitting: Obstetrics and Gynecology

## 2012-09-07 ENCOUNTER — Other Ambulatory Visit: Payer: Self-pay | Admitting: Family Medicine

## 2012-09-07 ENCOUNTER — Ambulatory Visit (INDEPENDENT_AMBULATORY_CARE_PROVIDER_SITE_OTHER): Payer: 59 | Admitting: Obstetrics and Gynecology

## 2012-09-07 ENCOUNTER — Encounter: Payer: Self-pay | Admitting: Obstetrics and Gynecology

## 2012-09-07 VITALS — BP 147/105 | HR 69 | Resp 18 | Ht 69.0 in | Wt 297.0 lb

## 2012-09-07 DIAGNOSIS — Z124 Encounter for screening for malignant neoplasm of cervix: Secondary | ICD-10-CM

## 2012-09-07 DIAGNOSIS — Z01419 Encounter for gynecological examination (general) (routine) without abnormal findings: Secondary | ICD-10-CM

## 2012-09-07 DIAGNOSIS — Z1151 Encounter for screening for human papillomavirus (HPV): Secondary | ICD-10-CM

## 2012-09-07 DIAGNOSIS — B20 Human immunodeficiency virus [HIV] disease: Secondary | ICD-10-CM

## 2012-09-07 NOTE — Patient Instructions (Signed)
Preventive Care for Adults, Female A healthy lifestyle and preventive care can promote health and wellness. Preventive health guidelines for women include the following key practices.  A routine yearly physical is a good way to check with your caregiver about your health and preventive screening. It is a chance to share any concerns and updates on your health, and to receive a thorough exam.  Visit your dentist for a routine exam and preventive care every 6 months. Brush your teeth twice a day and floss once a day. Good oral hygiene prevents tooth decay and gum disease.  The frequency of eye exams is based on your age, health, family medical history, use of contact lenses, and other factors. Follow your caregiver's recommendations for frequency of eye exams.  Eat a healthy diet. Foods like vegetables, fruits, whole grains, low-fat dairy products, and lean protein foods contain the nutrients you need without too many calories. Decrease your intake of foods high in solid fats, added sugars, and salt. Eat the right amount of calories for you.Get information about a proper diet from your caregiver, if necessary.  Regular physical exercise is one of the most important things you can do for your health. Most adults should get at least 150 minutes of moderate-intensity exercise (any activity that increases your heart rate and causes you to sweat) each week. In addition, most adults need muscle-strengthening exercises on 2 or more days a week.  Maintain a healthy weight. The body mass index (BMI) is a screening tool to identify possible weight problems. It provides an estimate of body fat based on height and weight. Your caregiver can help determine your BMI, and can help you achieve or maintain a healthy weight.For adults 20 years and older:  A BMI below 18.5 is considered underweight.  A BMI of 18.5 to 24.9 is normal.  A BMI of 25 to 29.9 is considered overweight.  A BMI of 30 and above is  considered obese.  Maintain normal blood lipids and cholesterol levels by exercising and minimizing your intake of saturated fat. Eat a balanced diet with plenty of fruit and vegetables. Blood tests for lipids and cholesterol should begin at age 20 and be repeated every 5 years. If your lipid or cholesterol levels are high, you are over 50, or you are at high risk for heart disease, you may need your cholesterol levels checked more frequently.Ongoing high lipid and cholesterol levels should be treated with medicines if diet and exercise are not effective.  If you smoke, find out from your caregiver how to quit. If you do not use tobacco, do not start.  If you are pregnant, do not drink alcohol. If you are breastfeeding, be very cautious about drinking alcohol. If you are not pregnant and choose to drink alcohol, do not exceed 1 drink per day. One drink is considered to be 12 ounces (355 mL) of beer, 5 ounces (148 mL) of wine, or 1.5 ounces (44 mL) of liquor.  Avoid use of street drugs. Do not share needles with anyone. Ask for help if you need support or instructions about stopping the use of drugs.  High blood pressure causes heart disease and increases the risk of stroke. Your blood pressure should be checked at least every 1 to 2 years. Ongoing high blood pressure should be treated with medicines if weight loss and exercise are not effective.  If you are 55 to 35 years old, ask your caregiver if you should take aspirin to prevent strokes.  Diabetes   screening involves taking a blood sample to check your fasting blood sugar level. This should be done once every 3 years, after age 45, if you are within normal weight and without risk factors for diabetes. Testing should be considered at a younger age or be carried out more frequently if you are overweight and have at least 1 risk factor for diabetes.  Breast cancer screening is essential preventive care for women. You should practice "breast  self-awareness." This means understanding the normal appearance and feel of your breasts and may include breast self-examination. Any changes detected, no matter how small, should be reported to a caregiver. Women in their 20s and 30s should have a clinical breast exam (CBE) by a caregiver as part of a regular health exam every 1 to 3 years. After age 40, women should have a CBE every year. Starting at age 40, women should consider having a mammography (breast X-ray test) every year. Women who have a family history of breast cancer should talk to their caregiver about genetic screening. Women at a high risk of breast cancer should talk to their caregivers about having magnetic resonance imaging (MRI) and a mammography every year.  The Pap test is a screening test for cervical cancer. A Pap test can show cell changes on the cervix that might become cervical cancer if left untreated. A Pap test is a procedure in which cells are obtained and examined from the lower end of the uterus (cervix).  Women should have a Pap test starting at age 21.  Between ages 21 and 29, Pap tests should be repeated every 2 years.  Beginning at age 30, you should have a Pap test every 3 years as long as the past 3 Pap tests have been normal.  Some women have medical problems that increase the chance of getting cervical cancer. Talk to your caregiver about these problems. It is especially important to talk to your caregiver if a new problem develops soon after your last Pap test. In these cases, your caregiver may recommend more frequent screening and Pap tests.  The above recommendations are the same for women who have or have not gotten the vaccine for human papillomavirus (HPV).  If you had a hysterectomy for a problem that was not cancer or a condition that could lead to cancer, then you no longer need Pap tests. Even if you no longer need a Pap test, a regular exam is a good idea to make sure no other problems are  starting.  If you are between ages 65 and 70, and you have had normal Pap tests going back 10 years, you no longer need Pap tests. Even if you no longer need a Pap test, a regular exam is a good idea to make sure no other problems are starting.  If you have had past treatment for cervical cancer or a condition that could lead to cancer, you need Pap tests and screening for cancer for at least 20 years after your treatment.  If Pap tests have been discontinued, risk factors (such as a new sexual partner) need to be reassessed to determine if screening should be resumed.  The HPV test is an additional test that may be used for cervical cancer screening. The HPV test looks for the virus that can cause the cell changes on the cervix. The cells collected during the Pap test can be tested for HPV. The HPV test could be used to screen women aged 30 years and older, and should   be used in women of any age who have unclear Pap test results. After the age of 30, women should have HPV testing at the same frequency as a Pap test.  Colorectal cancer can be detected and often prevented. Most routine colorectal cancer screening begins at the age of 50 and continues through age 75. However, your caregiver may recommend screening at an earlier age if you have risk factors for colon cancer. On a yearly basis, your caregiver may provide home test kits to check for hidden blood in the stool. Use of a small camera at the end of a tube, to directly examine the colon (sigmoidoscopy or colonoscopy), can detect the earliest forms of colorectal cancer. Talk to your caregiver about this at age 50, when routine screening begins. Direct examination of the colon should be repeated every 5 to 10 years through age 75, unless early forms of pre-cancerous polyps or small growths are found.  Hepatitis C blood testing is recommended for all people born from 1945 through 1965 and any individual with known risks for hepatitis C.  Practice  safe sex. Use condoms and avoid high-risk sexual practices to reduce the spread of sexually transmitted infections (STIs). STIs include gonorrhea, chlamydia, syphilis, trichomonas, herpes, HPV, and human immunodeficiency virus (HIV). Herpes, HIV, and HPV are viral illnesses that have no cure. They can result in disability, cancer, and death. Sexually active women aged 25 and younger should be checked for chlamydia. Older women with new or multiple partners should also be tested for chlamydia. Testing for other STIs is recommended if you are sexually active and at increased risk.  Osteoporosis is a disease in which the bones lose minerals and strength with aging. This can result in serious bone fractures. The risk of osteoporosis can be identified using a bone density scan. Women ages 65 and over and women at risk for fractures or osteoporosis should discuss screening with their caregivers. Ask your caregiver whether you should take a calcium supplement or vitamin D to reduce the rate of osteoporosis.  Menopause can be associated with physical symptoms and risks. Hormone replacement therapy is available to decrease symptoms and risks. You should talk to your caregiver about whether hormone replacement therapy is right for you.  Use sunscreen with sun protection factor (SPF) of 30 or more. Apply sunscreen liberally and repeatedly throughout the day. You should seek shade when your shadow is shorter than you. Protect yourself by wearing long sleeves, pants, a wide-brimmed hat, and sunglasses year round, whenever you are outdoors.  Once a month, do a whole body skin exam, using a mirror to look at the skin on your back. Notify your caregiver of new moles, moles that have irregular borders, moles that are larger than a pencil eraser, or moles that have changed in shape or color.  Stay current with required immunizations.  Influenza. You need a dose every fall (or winter). The composition of the flu vaccine  changes each year, so being vaccinated once is not enough.  Pneumococcal polysaccharide. You need 1 to 2 doses if you smoke cigarettes or if you have certain chronic medical conditions. You need 1 dose at age 65 (or older) if you have never been vaccinated.  Tetanus, diphtheria, pertussis (Tdap, Td). Get 1 dose of Tdap vaccine if you are younger than age 65, are over 65 and have contact with an infant, are a healthcare worker, are pregnant, or simply want to be protected from whooping cough. After that, you need a Td   booster dose every 10 years. Consult your caregiver if you have not had at least 3 tetanus and diphtheria-containing shots sometime in your life or have a deep or dirty wound.  HPV. You need this vaccine if you are a woman age 26 or younger. The vaccine is given in 3 doses over 6 months.  Measles, mumps, rubella (MMR). You need at least 1 dose of MMR if you were born in 1957 or later. You may also need a second dose.  Meningococcal. If you are age 19 to 21 and a first-year college student living in a residence hall, or have one of several medical conditions, you need to get vaccinated against meningococcal disease. You may also need additional booster doses.  Zoster (shingles). If you are age 60 or older, you should get this vaccine.  Varicella (chickenpox). If you have never had chickenpox or you were vaccinated but received only 1 dose, talk to your caregiver to find out if you need this vaccine.  Hepatitis A. You need this vaccine if you have a specific risk factor for hepatitis A virus infection or you simply wish to be protected from this disease. The vaccine is usually given as 2 doses, 6 to 18 months apart.  Hepatitis B. You need this vaccine if you have a specific risk factor for hepatitis B virus infection or you simply wish to be protected from this disease. The vaccine is given in 3 doses, usually over 6 months. Preventive Services / Frequency Ages 19 to 39  Blood  pressure check.** / Every 1 to 2 years.  Lipid and cholesterol check.** / Every 5 years beginning at age 20.  Clinical breast exam.** / Every 3 years for women in their 20s and 30s.  Pap test.** / Every 2 years from ages 21 through 29. Every 3 years starting at age 30 through age 65 or 70 with a history of 3 consecutive normal Pap tests.  HPV screening.** / Every 3 years from ages 30 through ages 65 to 70 with a history of 3 consecutive normal Pap tests.  Hepatitis C blood test.** / For any individual with known risks for hepatitis C.  Skin self-exam. / Monthly.  Influenza immunization.** / Every year.  Pneumococcal polysaccharide immunization.** / 1 to 2 doses if you smoke cigarettes or if you have certain chronic medical conditions.  Tetanus, diphtheria, pertussis (Tdap, Td) immunization. / A one-time dose of Tdap vaccine. After that, you need a Td booster dose every 10 years.  HPV immunization. / 3 doses over 6 months, if you are 26 and younger.  Measles, mumps, rubella (MMR) immunization. / You need at least 1 dose of MMR if you were born in 1957 or later. You may also need a second dose.  Meningococcal immunization. / 1 dose if you are age 19 to 21 and a first-year college student living in a residence hall, or have one of several medical conditions, you need to get vaccinated against meningococcal disease. You may also need additional booster doses.  Varicella immunization.** / Consult your caregiver.  Hepatitis A immunization.** / Consult your caregiver. 2 doses, 6 to 18 months apart.  Hepatitis B immunization.** / Consult your caregiver. 3 doses usually over 6 months. ** Family history and personal history of risk and conditions may change your caregiver's recommendations. Document Released: 07/21/2001 Document Revised: 08/17/2011 Document Reviewed: 10/20/2010 [employer] Patient Information 2013 [employer].  

## 2012-09-07 NOTE — Progress Notes (Signed)
  Subjective:     Shaela Boer is a 37 y.o. female  G2P2 with LMP 08/24/2012 and BMI 46 who is here for a comprehensive physical exam. The patient reports some dysmenorrhea every other months. Patient states that the first 2 days of her cycles are the worst but tylenol seems to help a little. Patient is not currently sexually active and has had a tubal ligation. 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 She is being followed by ID. She is coping fairly well with this new diagnosis and has good days and some bad days. Her husband is also positive and is under the care of ID as well.  History   Social History  . Marital Status: Married    Spouse Name: N/A    Number of Children: N/A  . Years of Education: N/A   Occupational History  . Not on file.   Social History Main Topics  . Smoking status: Current Every Day Smoker -- 0.30 packs/day    Types: Cigarettes  . Smokeless tobacco: Never Used     Comment: ~ 5 cigarettes daily  . Alcohol Use: No     Comment: rare  . Drug Use: No  . Sexually Active: Yes -- Female partner(s)    Birth Control/ Protection: Surgical     Comment: tubalization   Other Topics Concern  . Not on file   Social History Narrative   Lives with husband and 2 children, no pets   Occupation: call center rep   Edu: some college   Activity: no regular exercise.  Tries to walk around building.   Health Maintenance  Topic Date Due  . Tetanus/tdap  07/14/1996  . Influenza Vaccine  02/06/2013  . Pap Smear  07/21/2014       Review of Systems A comprehensive review of systems was negative.   Objective:      GENERAL: Well-developed, well-nourished female in no acute distress.  HEENT: Normocephalic, atraumatic. Sclerae anicteric.  NECK: Supple. Normal thyroid.  LUNGS: Clear to auscultation bilaterally.  HEART: Regular rate and rhythm. BREASTS: Symmetric in size. No palpable masses or lymphadenopathy, skin changes, or nipple drainage. ABDOMEN: Soft,  nontender, nondistended. No organomegaly. Obese PELVIC: Normal external female genitalia. Vagina is pink and rugated.  Normal discharge. Normal appearing cervix. Bimanual exam limited secondary to body habitus.No adnexal mass or tenderness. EXTREMITIES: No cyanosis, clubbing, or edema, 2+ distal pulses.    Assessment:    Healthy female exam.      Plan:    Pap smear performed Patient with elevated BP today. Discussed following up with Dr. Sharen Hones for further evaluation/management Discussed weight loss to improve BP and overall health Emotional support provided as patient became tearful while discussing her HIV status Patient will be contacted with any abnormal results See After Visit Summary for Counseling Recommendations

## 2012-09-08 ENCOUNTER — Other Ambulatory Visit (INDEPENDENT_AMBULATORY_CARE_PROVIDER_SITE_OTHER): Payer: 59

## 2012-09-08 DIAGNOSIS — Z21 Asymptomatic human immunodeficiency virus [HIV] infection status: Secondary | ICD-10-CM

## 2012-09-08 DIAGNOSIS — B20 Human immunodeficiency virus [HIV] disease: Secondary | ICD-10-CM

## 2012-09-08 LAB — CBC WITH DIFFERENTIAL/PLATELET
Basophils Absolute: 0 10*3/uL (ref 0.0–0.1)
Basophils Relative: 0.5 % (ref 0.0–3.0)
Eosinophils Absolute: 0.1 10*3/uL (ref 0.0–0.7)
Eosinophils Relative: 1.7 % (ref 0.0–5.0)
HCT: 37.4 % (ref 36.0–46.0)
Hemoglobin: 12.8 g/dL (ref 12.0–15.0)
Lymphocytes Relative: 35.8 % (ref 12.0–46.0)
Lymphs Abs: 2 10*3/uL (ref 0.7–4.0)
MCHC: 34.1 g/dL (ref 30.0–36.0)
MCV: 96.5 fl (ref 78.0–100.0)
Monocytes Absolute: 0.5 10*3/uL (ref 0.1–1.0)
Monocytes Relative: 8.2 % (ref 3.0–12.0)
Neutro Abs: 3 10*3/uL (ref 1.4–7.7)
Neutrophils Relative %: 53.8 % (ref 43.0–77.0)
Platelets: 254 10*3/uL (ref 150.0–400.0)
RBC: 3.88 Mil/uL (ref 3.87–5.11)
RDW: 12.2 % (ref 11.5–14.6)
WBC: 5.5 10*3/uL (ref 4.5–10.5)

## 2012-09-08 LAB — COMPREHENSIVE METABOLIC PANEL
ALT: 33 U/L (ref 0–35)
AST: 30 U/L (ref 0–37)
Albumin: 3.7 g/dL (ref 3.5–5.2)
Alkaline Phosphatase: 79 U/L (ref 39–117)
BUN: 14 mg/dL (ref 6–23)
CO2: 28 mEq/L (ref 19–32)
Calcium: 8.5 mg/dL (ref 8.4–10.5)
Chloride: 102 mEq/L (ref 96–112)
Creatinine, Ser: 0.8 mg/dL (ref 0.4–1.2)
GFR: 86.68 mL/min (ref >60.00)
Glucose, Bld: 111 mg/dL — ABNORMAL HIGH (ref 70–99)
Potassium: 4.4 mEq/L (ref 3.5–5.1)
Sodium: 134 mEq/L — ABNORMAL LOW (ref 135–145)
Total Bilirubin: 0.3 mg/dL (ref 0.3–1.2)
Total Protein: 7.3 g/dL (ref 6.0–8.3)

## 2012-09-08 LAB — LIPID PANEL
Cholesterol: 174 mg/dL (ref 0–200)
HDL: 54.3 mg/dL (ref >39.00)
LDL Cholesterol: 103 mg/dL — ABNORMAL HIGH (ref 0–99)
Total CHOL/HDL Ratio: 3
Triglycerides: 85 mg/dL (ref 0.0–149.0)
VLDL: 17 mg/dL (ref 0.0–40.0)

## 2012-09-14 ENCOUNTER — Ambulatory Visit (INDEPENDENT_AMBULATORY_CARE_PROVIDER_SITE_OTHER): Payer: 59 | Admitting: Family Medicine

## 2012-09-14 ENCOUNTER — Encounter: Payer: Self-pay | Admitting: Family Medicine

## 2012-09-14 VITALS — BP 150/108 | HR 88 | Temp 97.7°F | Ht 69.0 in | Wt 299.5 lb

## 2012-09-14 DIAGNOSIS — Z21 Asymptomatic human immunodeficiency virus [HIV] infection status: Secondary | ICD-10-CM

## 2012-09-14 DIAGNOSIS — Z0000 Encounter for general adult medical examination without abnormal findings: Secondary | ICD-10-CM

## 2012-09-14 DIAGNOSIS — Z720 Tobacco use: Secondary | ICD-10-CM

## 2012-09-14 DIAGNOSIS — F17200 Nicotine dependence, unspecified, uncomplicated: Secondary | ICD-10-CM

## 2012-09-14 DIAGNOSIS — R7309 Other abnormal glucose: Secondary | ICD-10-CM

## 2012-09-14 DIAGNOSIS — I10 Essential (primary) hypertension: Secondary | ICD-10-CM

## 2012-09-14 DIAGNOSIS — B20 Human immunodeficiency virus [HIV] disease: Secondary | ICD-10-CM

## 2012-09-14 DIAGNOSIS — R739 Hyperglycemia, unspecified: Secondary | ICD-10-CM

## 2012-09-14 DIAGNOSIS — M722 Plantar fascial fibromatosis: Secondary | ICD-10-CM | POA: Insufficient documentation

## 2012-09-14 MED ORDER — AMLODIPINE BESYLATE 5 MG PO TABS: 5.0000 mg | ORAL_TABLET | Freq: Every day | ORAL | Status: DC

## 2012-09-14 NOTE — Assessment & Plan Note (Signed)
Preventative protocols reviewed and updated unless pt declined. Discussed healthy diet and lifestyle.  

## 2012-09-14 NOTE — Assessment & Plan Note (Signed)
Continue to encourage smoking cessation. Action phase. Discussed different cessation assistance methoids including NRT, chantix and wellbutrin. Pt interested in trial of nicotine gum as well as nicotine inhaler - sample of nicotrol provided.

## 2012-09-14 NOTE — Assessment & Plan Note (Signed)
Discussed avoiding added sugars and importance of weight loss.

## 2012-09-14 NOTE — Assessment & Plan Note (Signed)
Right sided. Discussed dx - see pt instructions for plan. Provided with stretching exercises from [facility] pt advisor.

## 2012-09-14 NOTE — Patient Instructions (Addendum)
Try nicorette gum to help you quit smoking (over the counter).  Park and chew method. May also try inhaler sample. Start amlodipine for blood pressure control.  Return in 3 months for follow up For heel - sounds like plantar fasciitis - treat with anti inflammatory over the counter like aleve or advil and stretching exercises provided as well as ice water bath or frozen water bottle rub.  Buy heel cushion for right foot if bothersome with shoes. Congratulations with weight - try to start walking with family 3-4 times daily for 20 min at a time to increase activity. Good to see you today, call us with questions.
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
## 2012-09-14 NOTE — Assessment & Plan Note (Signed)
Stable.  atripla seems to be working well.

## 2012-09-14 NOTE — Assessment & Plan Note (Signed)
Discussed increased regular activity. Provided with portion sizes handout from family doctor. Congratulated on weight loss.

## 2012-09-14 NOTE — Assessment & Plan Note (Signed)
Consistently elevated over last several months as evidenced by recent office visits here, OBGYN and ID. Discussed reasons to treat HTN. Start amlodipine - monitor for ankle edema. rtc 3 mo for f/u. Consider EKG next visit.

## 2012-10-12 ENCOUNTER — Telehealth: Payer: Self-pay | Admitting: *Deleted

## 2012-10-12 NOTE — Telephone Encounter (Signed)
Pt called adv has been tracking cycles over the last few months and they have been coming closer and closer together. Last cycle was only 19 days - I adv pt to come in for office visit to see if there are any options to help regulate cycle - made appt for pt next Wednesday

## 2012-10-19 ENCOUNTER — Encounter: Payer: Self-pay | Admitting: Family Medicine

## 2012-10-19 ENCOUNTER — Other Ambulatory Visit: Payer: Self-pay | Admitting: Family Medicine

## 2012-10-19 ENCOUNTER — Ambulatory Visit (INDEPENDENT_AMBULATORY_CARE_PROVIDER_SITE_OTHER): Payer: 59 | Admitting: Family Medicine

## 2012-10-19 VITALS — BP 121/86 | HR 76 | Ht 69.0 in | Wt 293.0 lb

## 2012-10-19 DIAGNOSIS — N920 Excessive and frequent menstruation with regular cycle: Secondary | ICD-10-CM

## 2012-10-19 LAB — TSH: TSH: 1.164 u[IU]/mL (ref 0.350–4.500)

## 2012-10-19 NOTE — Assessment & Plan Note (Signed)
Heavier cycles and enlarged uterus with pain.  Will check u/s, emb and TSH, FSH.  Will discuss further treatment options once results are back.

## 2012-10-19 NOTE — Progress Notes (Signed)
  Subjective:    Patient ID: Cassandra [name], female    DOB: 11-08-1975, 37 y.o.   MRN: [id number]  Vaginal Bleeding The patient's primary symptoms include pelvic pain. The patient's pertinent negatives include no vaginal discharge. This is a chronic problem. Episode onset: 5-6 months ago. The problem has been gradually worsening. Pertinent negatives include no constipation, diarrhea, dysuria, nausea or vomiting. Nothing aggravates the symptoms. She has tried heating pads and NSAIDs for the symptoms. She is sexually active. She uses tubal ligation for contraception. Her menstrual history has been irregular (Coming closer together, and lasts longer 7 days now and heavier.).   Mom went through menopause at age 47.   Review of Systems  Respiratory: Negative for chest tightness.   Cardiovascular: Negative for chest pain.  Gastrointestinal: Negative for nausea, vomiting, diarrhea and constipation.  Genitourinary: Positive for vaginal bleeding and pelvic pain. Negative for dysuria, vaginal discharge and vaginal pain.       Objective:   Physical Exam  Vitals reviewed. Constitutional: She appears well-developed and well-nourished. No distress.  HENT:  Head: Normocephalic and atraumatic.  Neck: Neck supple.  Cardiovascular: Normal rate and regular rhythm.   Pulmonary/Chest: Effort normal.  Abdominal: Soft.  Genitourinary:  NEFG, BUS is WNL, vagina is pink and ruggated, cervix is nulliparous in appearance and without lesion.  Uterus is 14-16 wk size, firm.   Procedure: Patient given informed consent, signed copy in the chart, time out was performed. Appropriate time out taken. . The patient was placed in the lithotomy position and the cervix brought into view with sterile speculum.  Portio of cervix cleansed x 2 with betadine swabs.  A tenaculum was placed in the anterior lip of the cervix.  The uterus was sounded for depth of 14 cm. A pipelle was introduced to into the uterus, suction  created,  and an endometrial sample was obtained. All equipment was removed and accounted for.  The patient tolerated the procedure well.    Patient given post procedure instructions.         Assessment & Plan:

## 2012-10-19 NOTE — Patient Instructions (Signed)
Pelvic Pain Pelvic pain is pain below the belly button and located between your hips. Acute pain may last a few hours or days. Chronic pelvic pain may last weeks and months. The cause may be different for different types of pain. The pain may be dull or sharp, mild or severe and can interfere with your daily activities. Write down and tell your caregiver:   Exactly where the pain is located.  If it comes and goes or is there all the time.  When it happens (with sex, urination, bowel movement, etc.)  If the pain is related to your menstrual period or stress. Your caregiver will take a full history and do a complete physical exam and Pap test. CAUSES   Painful menstrual periods (dysmenorrhea).  Normal ovulation (Mittelschmertz) that occurs in the middle of the menstrual cycle every month.  The pelvic organs get engorged with blood just before the menstrual period (pelvic congestive syndrome).  Scar tissue from an infection or past surgery (pelvic adhesions).  Cancer of the female pelvic organs. When there is pain with cancer, it has been there for a long time.  The lining of the uterus (endometrium) abnormally grows in places like the pelvis and on the pelvic organs (endometriosis).  A form of endometriosis with the lining of the uterus present inside of the muscle tissue of the uterus (adenomyosis).  Fibroid tumor (noncancerous) in the uterus.  Bladder problems such as infection, bladder spasms of the muscle tissue of the bladder.  Intestinal problems (irritable bowel syndrome, colitis, an ulcer or gastrointestinal infection).  Polyps of the cervix or uterus.  Pregnancy in the tube (ectopic pregnancy).  The opening of the cervix is too small for the menstrual blood to flow through it (cervical stenosis).  Physical or sexual abuse (past or present).  Musculo-skeletal problems from poor posture, problems with the vertebrae of the lower back or the uterine pelvic muscles falling  (prolapse).  Psychological problems such as depression or stress.  IUD (intrauterine device) in the uterus. DIAGNOSIS  Tests to make a diagnosis depends on the type, location, severity and what causes the pain to occur. Tests that may be needed include:  Blood tests.  Urine tests  Ultrasound.  X-rays.  CT Scan.  MRI.  Laparoscopy.  Major surgery. TREATMENT  Treatment will depend on the cause of the pain, which includes:  Prescription or over-the-counter pain medication.  Antibiotics.  Birth control pills.  Hormone treatment.  Nerve blocking injections.  Physical therapy.  Antidepressants.  Counseling with a psychiatrist or psychologist.  Minor or major surgery. HOME CARE INSTRUCTIONS   Only take over-the-counter or prescription medicines for pain, discomfort or fever as directed by your caregiver.  Follow your caregiver's advice to treat your pain.  Rest.  Avoid sexual intercourse if it causes the pain.  Apply warm or cold compresses (which ever works best) to the pain area.  Do relaxation exercises such as yoga or meditation.  Try acupuncture.  Avoid stressful situations.  Try group therapy.  If the pain is because of a stomach/intestinal upset, drink clear liquids, eat a bland light food diet until the symptoms go away. SEEK MEDICAL CARE IF:   You need stronger prescription pain medication.  You develop pain with sexual intercourse.  You have pain with urination.  You develop a temperature of 102 F (38.9 C) with the pain.  You are still in pain after 4 hours of taking prescription medication for the pain.  You need depression medication.    Your IUD is causing pain and you want it removed. SEEK IMMEDIATE MEDICAL CARE IF:  You develop very severe pain or tenderness.  You faint, have chills, severe weakness or dehydration.  You develop heavy vaginal bleeding or passing solid tissue.  You develop a temperature of 102 F (38.9 C)  with the pain.  You have blood in the urine.  You are being physically or sexually abused.  You have uncontrolled vomiting and diarrhea.  You are depressed and afraid of harming yourself or someone else. Document Released: 07/02/2004 Document Revised: 08/17/2011 Document Reviewed: 03/29/2008 [facility] Patient Information 2013 [place], [place]. Menorrhagia Dysfunctional uterine bleeding is different from a normal menstrual period. When periods are heavy or there is more bleeding than is usual for you, it is called menorrhagia. It may be caused by hormonal imbalance, or physical, metabolic, or other problems. Examination is necessary in order that your caregiver may treat treatable causes. If this is a continuing problem, a D&C may be needed. That means that the cervix (the opening of the uterus or womb) is dilated (stretched larger) and the lining of the uterus is scraped out. The tissue scraped out is then examined under a microscope by a specialist (pathologist) to make sure there is nothing of concern that needs further or more extensive treatment. HOME CARE INSTRUCTIONS   If medications were prescribed, take exactly as directed. Do not change or switch medications without consulting your caregiver.  Long term heavy bleeding may result in iron deficiency. Your caregiver may have prescribed iron pills. They help replace the iron your body lost from heavy bleeding. Take exactly as directed. Iron may cause constipation. If this becomes a problem, increase the bran, fruits, and roughage in your diet.  Do not take aspirin or medicines that contain aspirin one week before or during your menstrual period. Aspirin may make the bleeding worse.  If you need to change your sanitary pad or tampon more than once every 2 hours, stay in bed and rest as much as possible until the bleeding stops.  Eat well-balanced meals. Eat foods high in iron. Examples are leafy green vegetables, meat, liver, eggs, and whole  grain breads and cereals. Do not try to lose weight until the abnormal bleeding has stopped and your blood iron level is back to normal. SEEK MEDICAL CARE IF:   You need to change your sanitary pad or tampon more than once an hour.  You develop nausea (feeling sick to your stomach) and vomiting, dizziness, or diarrhea while you are taking your medicine.  You have any problems that may be related to the medicine you are taking. SEEK IMMEDIATE MEDICAL CARE IF:   You have a fever.  You develop chills.  You develop severe bleeding or start to pass blood clots.  You feel dizzy or faint. MAKE SURE YOU:   Understand these instructions.  Will watch your condition.  Will get help right away if you are not doing well or get worse. Document Released: 05/25/2005 Document Revised: 08/17/2011 Document Reviewed: 01/13/2008 [facility] Patient Information 2013 [place], [place].

## 2012-10-20 LAB — FOLLICLE STIMULATING HORMONE: FSH: 8.3 m[IU]/mL

## 2012-10-21 NOTE — Addendum Note (Signed)
Addended by: Barbara Cower on: 10/21/2012 10:10 AM   Modules accepted: Orders

## 2012-11-02 ENCOUNTER — Ambulatory Visit (HOSPITAL_COMMUNITY)
Admission: RE | Admit: 2012-11-02 | Discharge: 2012-11-02 | Disposition: A | Discharge status: Home / Self Care | Payer: 59 | Source: Ambulatory Visit | Attending: Family Medicine | Admitting: Family Medicine

## 2012-11-02 ENCOUNTER — Inpatient Hospital Stay (HOSPITAL_COMMUNITY): Admission: AD | Admit: 2012-11-02 | Payer: Self-pay | Source: Ambulatory Visit | Admitting: Family Medicine

## 2012-11-02 DIAGNOSIS — N926 Irregular menstruation, unspecified: Secondary | ICD-10-CM | POA: Insufficient documentation

## 2012-11-02 DIAGNOSIS — N949 Unspecified condition associated with female genital organs and menstrual cycle: Secondary | ICD-10-CM | POA: Insufficient documentation

## 2012-11-02 DIAGNOSIS — E669 Obesity, unspecified: Secondary | ICD-10-CM | POA: Insufficient documentation

## 2012-11-02 DIAGNOSIS — N920 Excessive and frequent menstruation with regular cycle: Secondary | ICD-10-CM | POA: Insufficient documentation

## 2012-11-09 ENCOUNTER — Ambulatory Visit (INDEPENDENT_AMBULATORY_CARE_PROVIDER_SITE_OTHER): Payer: 59 | Admitting: Family Medicine

## 2012-11-09 ENCOUNTER — Encounter: Payer: Self-pay | Admitting: Family Medicine

## 2012-11-09 VITALS — BP 118/93 | HR 86 | Resp 16 | Ht 69.0 in | Wt 293.0 lb

## 2012-11-09 DIAGNOSIS — N920 Excessive and frequent menstruation with regular cycle: Secondary | ICD-10-CM

## 2012-11-09 MED ORDER — MEDROXYPROGESTERONE ACETATE 5 MG PO TABS: 5.0000 mg | ORAL_TABLET | Freq: Every day | ORAL | Status: DC

## 2012-11-09 NOTE — Assessment & Plan Note (Signed)
Options discussed at length with pt.  She is not a candidate for OC's given age, smoking status, HTN, other co-morbidities.  At present she is not interested in surgical options.  We discussed endometrial ablation, touched on hysterectomy, discussed IUD. For now she is most interested in pursuing oral progesterone to control cycles.  Will begin and have her return in 3 months to see if this is a viable option for symptom control.

## 2012-11-09 NOTE — Patient Instructions (Signed)

## 2012-11-09 NOTE — Progress Notes (Signed)
  Subjective:    Patient ID: Cassandra [name], female    DOB: 1975-09-02, 37 y.o.   MRN: [id number]  HPI  Here for f/u.  Previously seen for menorrhagia and work up included TSH, FSH, EMB pelvic sono.  All were WNL.  Back for discussion of treatment options.  Review of Systems  Respiratory: Negative for shortness of breath.   Cardiovascular: Negative for chest pain.  Gastrointestinal: Negative for abdominal pain.  Genitourinary: Positive for menstrual problem and pelvic pain.       Objective:   Physical Exam  Vitals reviewed. Constitutional: She appears well-developed and well-nourished. No distress.  Eyes: No scleral icterus.  Neck: Neck supple.  Cardiovascular: Normal rate.   Pulmonary/Chest: Effort normal.  Abdominal: Soft.          Assessment & Plan:

## 2012-12-14 ENCOUNTER — Ambulatory Visit (INDEPENDENT_AMBULATORY_CARE_PROVIDER_SITE_OTHER): Payer: 59 | Admitting: Family Medicine

## 2012-12-14 ENCOUNTER — Encounter: Payer: Self-pay | Admitting: Family Medicine

## 2012-12-14 VITALS — BP 118/74 | HR 76 | Temp 97.7°F | Wt 289.2 lb

## 2012-12-14 DIAGNOSIS — I10 Essential (primary) hypertension: Secondary | ICD-10-CM

## 2012-12-14 DIAGNOSIS — Z23 Encounter for immunization: Secondary | ICD-10-CM

## 2012-12-14 DIAGNOSIS — Z21 Asymptomatic human immunodeficiency virus [HIV] infection status: Secondary | ICD-10-CM

## 2012-12-14 DIAGNOSIS — B20 Human immunodeficiency virus [HIV] disease: Secondary | ICD-10-CM

## 2012-12-14 MED ORDER — AMLODIPINE BESYLATE 2.5 MG PO TABS: 2.5000 mg | ORAL_TABLET | Freq: Every day | ORAL | Status: DC

## 2012-12-14 NOTE — Assessment & Plan Note (Signed)
Continued weight loss noted. Congratulated and continued to encourage weight loss through healthy diet/lifestyle.

## 2012-12-14 NOTE — Assessment & Plan Note (Signed)
Stable on atripla. Has appt with ID today.

## 2012-12-14 NOTE — Addendum Note (Signed)
Addended by: Sydell Axon C on: 12/14/2012 11:31 AM   Modules accepted: Orders

## 2012-12-14 NOTE — Patient Instructions (Addendum)
Let's decrease amlodipine to 1/2 pill daily.  Keep track of blood pressures at home. Congratulation on weight - I think this is significantly helping with hypertension. Return in 6 months for follow up. Tdap today.  Pneumovax probably next visit.

## 2012-12-14 NOTE — Assessment & Plan Note (Signed)
Actually significantly improved likely attributable to weight loss and amlodipine 5mg  daily. Will decrease to 2.5mg  daily and monitor blood pressures at home. Return in 6 months for follow up.

## 2012-12-14 NOTE — Progress Notes (Signed)
  Subjective:    Patient ID: Cassandra [name], female    DOB: 1975/08/14, 37 y.o.   MRN: [id number]  HPI CC: 3 mo f/u HTN  Recently started on oral depo provera for menorrhagia by Dr. Shawnie Pons.    HTN - consistently elevated blood pressure readings last few visits so last office visit we discussed starting amlodipine.  Pt taking 5mg  daily compliant with med and tolerating well.  No HA, vision changes, CP/tightness, SOB, leg swelling.  No recent EKG.  No ankle swelling.  Occasional headaches around period.  H/o HIV - followed by Dr. Leavy Cella.  On atripla. Weight continues to drop. Talking more - eating healthy.  Tdap today.  ?pneumovax.  Wt Readings from Last 3 Encounters:  12/14/12 289 lb 4 oz (131.203 kg)  11/09/12 293 lb (132.904 kg)  10/19/12 293 lb (132.904 kg)   Past Medical History  Diagnosis Date  . Generalized headaches     frequent  . History of gestational diabetes     first 2 pregnancies  . History of abnormal Pap smear     remote  . Morbid obesity   . HSV-2 seropositive   . Cat allergies     and dogs  . Tobacco use   . Periodontal disease 08/2011    currently getting dental work  . HIV infection     Review of Systems Per HPI    Objective:   Physical Exam  Nursing note and vitals reviewed. Constitutional: She appears well-developed and well-nourished. No distress.  obese  HENT:  Head: Normocephalic.  Mouth/Throat: Oropharynx is clear and moist. No oropharyngeal exudate.  Eyes: Conjunctivae and EOM are normal. Pupils are equal, round, and reactive to light. No scleral icterus.  Neck: Normal range of motion. Neck supple.  Cardiovascular: Normal rate, regular rhythm, normal heart sounds and intact distal pulses.   No murmur heard. Pulmonary/Chest: Effort normal and breath sounds normal. No respiratory distress. She has no wheezes. She has no rales.  Musculoskeletal: She exhibits no edema.  Lymphadenopathy:    She has no cervical adenopathy.        Assessment & Plan:

## 2013-03-20 ENCOUNTER — Other Ambulatory Visit: Payer: Self-pay | Admitting: *Deleted

## 2013-03-20 DIAGNOSIS — N920 Excessive and frequent menstruation with regular cycle: Secondary | ICD-10-CM

## 2013-03-20 MED ORDER — AMLODIPINE BESYLATE 2.5 MG PO TABS: 2.5000 mg | ORAL_TABLET | Freq: Every day | ORAL | Status: DC

## 2013-03-20 MED ORDER — MEDROXYPROGESTERONE ACETATE 5 MG PO TABS: 5.0000 mg | ORAL_TABLET | Freq: Every day | ORAL | Status: DC

## 2013-03-20 NOTE — Telephone Encounter (Signed)
Patient made appointment for next week so I gave her one refill of Provera to last until her appointment so she doesn't run out of her medication.  She states that it is working well.

## 2013-03-28 ENCOUNTER — Encounter: Payer: Self-pay | Admitting: Family Medicine

## 2013-03-28 ENCOUNTER — Ambulatory Visit (INDEPENDENT_AMBULATORY_CARE_PROVIDER_SITE_OTHER): Payer: 59 | Admitting: Nurse Practitioner

## 2013-03-28 VITALS — BP 131/105 | HR 84 | Ht 69.0 in | Wt 292.0 lb

## 2013-03-28 DIAGNOSIS — N920 Excessive and frequent menstruation with regular cycle: Secondary | ICD-10-CM

## 2013-03-28 DIAGNOSIS — Z01812 Encounter for preprocedural laboratory examination: Secondary | ICD-10-CM

## 2013-03-28 DIAGNOSIS — Z30430 Encounter for insertion of intrauterine contraceptive device: Secondary | ICD-10-CM

## 2013-03-28 LAB — POCT URINE PREGNANCY: Preg Test, Ur: NEGATIVE

## 2013-03-28 NOTE — Progress Notes (Signed)
IUD Procedure Note Patient identified, informed consent performed, signed copy in chart, time out was performed.  Urine pregnancy test negative.  Speculum placed in the vagina.  Cervix visualized.  Cleaned with Betadine x 2.  Grasped anteriorly with a single tooth tenaculum.  Uterus sounded to 7 cm.  Mirena IUD placed per manufacturer's recommendations by Dr Shawnie Pons due to difficulty with sounding. Strings trimmed to 4 cm. Tenaculum was removed, good hemostasis noted.  Patient tolerated procedure well.   Patient given post procedure instructions  Patient is asked to check IUD strings periodically and follow up in 4-6 weeks for IUD check. Advised to take Motrin as needed for cramping/ bleeding

## 2013-03-28 NOTE — Patient Instructions (Signed)
Levonorgestrel intrauterine device (IUD) What is this medicine? LEVONORGESTREL IUD (LEE voe nor jes trel) is a contraceptive (birth control) device. The device is placed inside the uterus by a healthcare professional. It is used to prevent pregnancy and can also be used to treat heavy bleeding that occurs during your period. Depending on the device, it can be used for 3 to 5 years. This medicine may be used for other purposes; ask your health care provider or pharmacist if you have questions. What should I tell my health care provider before I take this medicine? They need to know if you have any of these conditions: -abnormal Pap smear -cancer of the breast, uterus, or cervix -diabetes -endometritis -genital or pelvic infection now or in the past -have more than one sexual partner or your partner has more than one partner -heart disease -history of an ectopic or tubal pregnancy -immune system problems -IUD in place -liver disease or tumor -problems with blood clots or take blood-thinners -use intravenous drugs -uterus of unusual shape -vaginal bleeding that has not been explained -an unusual or allergic reaction to levonorgestrel, other hormones, silicone, or polyethylene, medicines, foods, dyes, or preservatives -pregnant or trying to get pregnant -breast-feeding How should I use this medicine? This device is placed inside the uterus by a health care professional. Talk to your pediatrician regarding the use of this medicine in children. Special care may be needed. Overdosage: If you think you have taken too much of this medicine contact a poison control center or emergency room at once. NOTE: This medicine is only for you. Do not share this medicine with others. What if I miss a dose? This does not apply. What may interact with this medicine? Do not take this medicine with any of the following medications: -amprenavir -bosentan -fosamprenavir This medicine may also interact with  the following medications: -aprepitant -barbiturate medicines for inducing sleep or treating seizures -bexarotene -griseofulvin -medicines to treat seizures like carbamazepine, ethotoin, felbamate, oxcarbazepine, phenytoin, topiramate -modafinil -pioglitazone -rifabutin -rifampin -rifapentine -some medicines to treat HIV infection like atazanavir, indinavir, lopinavir, nelfinavir, tipranavir, ritonavir -St. John's wort -warfarin This list may not describe all possible interactions. Give your health care provider a list of all the medicines, herbs, non-prescription drugs, or dietary supplements you use. Also tell them if you smoke, drink alcohol, or use illegal drugs. Some items may interact with your medicine. What should I watch for while using this medicine? Visit your doctor or health care professional for regular check ups. See your doctor if you or your partner has sexual contact with others, becomes HIV positive, or gets a sexual transmitted disease. This product does not protect you against 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AIDS) or other sexually transmitted diseases. You can check the placement of the IUD yourself by reaching up to the top of your vagina with clean fingers to feel the threads. Do not pull on the threads. It is a good habit to check placement after each menstrual period. Call your doctor right away if you feel more of the IUD than just the threads or if you cannot feel the threads at all. The IUD may come out by itself. You may become pregnant if the device comes out. If you notice that the IUD has come out use a backup birth control method like condoms and call your health care provider. Using tampons will not change the position of the IUD and are okay to use during your period. What side effects may I notice from receiving this medicine?   Side effects that you should report to your doctor or health care professional as soon as possible: -allergic reactions like skin rash, itching or  hives, swelling of the face, lips, or tongue -fever, flu-like symptoms -genital sores -high blood pressure -no menstrual period for 6 weeks during use -pain, swelling, warmth in the leg -pelvic pain or tenderness -severe or sudden headache -signs of pregnancy -stomach cramping -sudden shortness of breath -trouble with balance, talking, or walking -unusual vaginal bleeding, discharge -yellowing of the eyes or skin Side effects that usually do not require medical attention (report to your doctor or health care professional if they continue or are bothersome): -acne -breast pain -change in sex drive or performance -changes in weight -cramping, dizziness, or faintness while the device is being inserted -headache -irregular menstrual bleeding within first 3 to 6 months of use -nausea This list may not describe all possible side effects. Call your doctor for medical advice about side effects. You may report side effects to FDA at 1-800-FDA-1088. Where should I keep my medicine? This does not apply. NOTE: This sheet is a summary. It may not cover all possible information. If you have questions about this medicine, talk to your doctor, pharmacist, or health care provider.  2013, [employer]. (06/25/2011 1:54:04 PM)  

## 2013-04-13 ENCOUNTER — Other Ambulatory Visit: Payer: Self-pay

## 2013-04-21 ENCOUNTER — Encounter: Payer: Self-pay | Admitting: Obstetrics & Gynecology

## 2013-04-21 ENCOUNTER — Ambulatory Visit (INDEPENDENT_AMBULATORY_CARE_PROVIDER_SITE_OTHER): Payer: 59 | Admitting: Obstetrics & Gynecology

## 2013-04-21 VITALS — BP 125/99 | HR 67 | Ht 69.0 in | Wt 289.0 lb

## 2013-04-21 DIAGNOSIS — Z30431 Encounter for routine checking of intrauterine contraceptive device: Secondary | ICD-10-CM

## 2013-04-21 NOTE — Progress Notes (Signed)
  Subjective:    Patient ID: Cassandra [name], female    DOB: [birth year], 37 y.o.   MRN: [id number]  HPI  Ms. Augusto Garbe is a 37 yo P2 who is here for her IUD string check. She has had one period in the last 3 weeks since insertion. She denies any problems and has no questions. She quit taking the po provera when the Mirena was placed.  Review of Systems She has had a flu vaccine this season.    Objective:   Physical Exam  IUD strings seen      Assessment & Plan:  Contraception- Mirena RTC 1 year for annual

## 2013-06-21 ENCOUNTER — Ambulatory Visit: Payer: Self-pay | Admitting: Family Medicine

## 2013-06-28 ENCOUNTER — Ambulatory Visit (INDEPENDENT_AMBULATORY_CARE_PROVIDER_SITE_OTHER): Payer: 59 | Admitting: Family Medicine

## 2013-06-28 ENCOUNTER — Encounter: Payer: Self-pay | Admitting: Family Medicine

## 2013-06-28 ENCOUNTER — Other Ambulatory Visit: Payer: Self-pay | Admitting: *Deleted

## 2013-06-28 VITALS — BP 138/88 | HR 80 | Temp 97.9°F | Wt 291.0 lb

## 2013-06-28 DIAGNOSIS — F17200 Nicotine dependence, unspecified, uncomplicated: Secondary | ICD-10-CM

## 2013-06-28 DIAGNOSIS — I10 Essential (primary) hypertension: Secondary | ICD-10-CM

## 2013-06-28 DIAGNOSIS — G5620 Lesion of ulnar nerve, unspecified upper limb: Secondary | ICD-10-CM

## 2013-06-28 DIAGNOSIS — Z720 Tobacco use: Secondary | ICD-10-CM

## 2013-06-28 MED ORDER — AMLODIPINE BESYLATE 5 MG PO TABS: 5.0000 mg | ORAL_TABLET | Freq: Every day | ORAL | Status: DC

## 2013-06-28 NOTE — Assessment & Plan Note (Signed)
Down to some day smoker.

## 2013-06-28 NOTE — Patient Instructions (Addendum)
Let's increase amlodipine to 5mg  daily.  I think this will help keep your blood pressures well controlled. I think you may have right sided ulnar neuropathy - possibly from compression while you were sleeping.  See below for some examples of exercises that can help. Let me know if not improving with below treatment, or if any new numbness at other areas of body.   Ulnar Nerve Contusion with Rehab The ulnar nerve lies near the surface of the skin as it passes by the elbow. This location causes it to be susceptible to injury. An ulnar nerve contusion is a bruise of the nerve. It is the result of direct trauma to the elbow. Ulnar nerve contusions are characterized by pain, weakness, and loss of feeling in the hand. SYMPTOMS   Signs of nerve damage include: tingling, numbness, weakness, and/or loss of feeling in the hand, specifically the little finger and ring finger.  Sharp pains that may shoot from the elbow to the wrist and hand.  Decreased hand function.  Tenderness and/ or inflammation in the elbow.  Muscle wasting (atrophy) in the hand. CAUSES  Ulnar nerve contusions are caused by direct trauma to the elbow that results in bleeding which enters the nerve. RISK INCREASES WITH:  Contact sports (football, soccer, or rugby).  Bleeding disorders.  Taking blood thinning medicine (warfarin [Coumadin], aspirin, or nonsteroidal anti-inflammatory medications).  Diabetes mellitus.  Underactive thyroid gland (hypothyroidism). PREVENTION  Wear properly fitted and padded protective equipment.  Only take blood thinning medication when necessary. PROGNOSIS  Ulnar nerve contusions usually heal within 6 weeks. Healing often occurs spontaneously, but treatment helps reduce symptoms.  RELATED COMPLICATIONS   Permanent nerve damage, including pain, numbness, tingling, or weakness in the hand (rare).  Weak grip.  Prolonged healing time, if improperly treated or re-injured. TREATMENT    Treatment initially involves resting from any activities that aggravate the symptoms, and the use of ice and medications to help reduce pain and inflammation. The use of strengthening and stretching exercises may help reduce pain with activity. These exercises may be performed at home or with referral to a therapist. Your caregiver may recommend that you splint the elbow at night to help healing of the nerve. If symptoms persist despite conservative (non-surgical) treatment, then surgery may be recommended to free the nerve. MEDICATION   If pain medication is necessary, then nonsteroidal anti-inflammatory medications, such as aspirin and ibuprofen, or other minor pain relievers, such as acetaminophen, are often recommended.  Do not take pain medication within 7 days before surgery.  Prescription pain relievers may be given if deemed necessary by your caregiver. Use only as directed and only as much as you need. COLD THERAPY  Cold treatment (icing) relieves pain and reduces inflammation. Cold treatment should be applied for 10 to 15 minutes every 2 to 3 hours for inflammation and pain and immediately after any activity that aggravates your symptoms. Use ice packs or massage the area with a piece of ice (ice massage). SEEK MEDICAL CARE IF:   Treatment seems to offer no benefit, or the condition worsens.  Any medications produce adverse side effects. EXERCISES RANGE OF MOTION (ROM) AND STRETCHING EXERCISES - Ulnar Nerve Contusion These exercises may help you when beginning to rehabilitate your injury. Do not begin these exercises until your physician, physical therapist or athletic trainer advises you to do so. Discontinue any exercise that worsens your symptoms. Contact your physician with instructions on how to continue. Your symptoms may resolve with or without  further involvement from your physician, physical therapist or athletic trainer. While completing these exercises, remember:  Restoring  tissue flexibility helps normal motion to return to the joints. This allows healthier, less painful movement and activity.  An effective stretch should be held for at least 30 seconds.  A stretch should never be painful. You should only feel a gentle lengthening or release in the stretched tissue. RANGE OF MOTION  Extension  Hold your right / left arm at your side and straighten your elbow as far as you can using your right / left arm muscles.  Straighten the right / left elbow farther by gently pushing down on your forearm until you feel a gentle stretch on the inside of your elbow. Hold this position for _______ seconds.  Slowly return to the starting position. Repeat __________ times. Complete this exercise __________ times per day.  RANGE OF MOTION  Flexion  Hold your right / left arm at your side and bend your elbow as far as you can using your right / left arm muscles.  Bend the right / left elbow farther by gently pushing up on your forearm until you feel a gentle stretch on the outside of your elbow. Hold this position for __________ seconds.  Slowly return to the starting position. Repeat __________ times. Complete this exercise __________ times per day.  RANGE OF MOTION  Wrist Flexion, Active-Assisted  Extend your right / left elbow with your fingers pointing down.*  Gently pull the back of your hand towards you until you feel a gentle stretch on the top of your forearm.  Hold this position for __________ seconds. Repeat __________ times. Complete this exercise __________ times per day.  *If directed by your physician, physical therapist or athletic trainer, complete this stretch with your elbow bent rather than extended. RANGE OF MOTION  Wrist Extension, Active-Assisted  Extend your right / left elbow and turn your palm upwards.*  Gently pull your palm/fingertips back so your wrist extends and your fingers point more toward the ground.  You should feel a gentle stretch  on the inside of your forearm.  Hold this position for __________ seconds. Repeat __________ times. Complete this exercise __________ times per day. *If directed by your physician, physical therapist or athletic trainer, complete this stretch with your elbow bent, rather than extended. RANGE OF MOTION  Supination, Active  Stand or sit with your elbows at your side. Bend your right / left elbow to 90 degrees.  Turn your palm upward until you feel a gentle stretch on the inside of your forearm.  Hold this position for __________ seconds. Slowly release and return to the starting position. Repeat __________ times. Complete this stretch __________ times per day.  RANGE OF MOTION  Pronation, Active  Stand or sit with your elbows at your side. Bend your right / left elbow to 90 degrees.  Turn your palm downward until you feel a gentle stretch on the top of your forearm.  Hold this position for __________ seconds. Slowly release and return to the starting position. Repeat __________ times. Complete this stretch __________ times per day.  STRETCH - Wrist Flexion   Place the back of your right / left hand on a tabletop leaving your elbow slightly bent. Your fingers should point away from your body.  Gently press the back of your hand down onto the table by straightening your elbow. You should feel a stretch on the top of your forearm.  Hold this position for __________ seconds.  Repeat __________ times. Complete this stretch __________ times per day.  STRETCH  Wrist Extension   Place your right / left fingertips on a tabletop leaving your elbow slightly bent. Your fingers should point backwards.  Gently press your fingers and palm down onto the table by straightening your elbow. You should feel a stretch on the inside of your forearm.  Hold this position for __________ seconds. Repeat __________ times. Complete this stretch __________ times per day.  STRENGTHENING EXERCISES - Ulnar Nerve  Contusion These exercises may help you when beginning to rehabilitate your injury. Do not begin these exercises until your physician, physical therapist or athletic trainer advises you to do so. Discontinue any exercise that worsens your symptoms. Contact your physician for instructions on how to continue. They may resolve your symptoms with or without further involvement from your physician, physical therapist or athletic trainer. While completing these exercises, remember:   Muscles can gain both the endurance and the strength needed for everyday activities through controlled exercises.  Complete these exercises as instructed by your physician, physical therapist or athletic trainer. Progress with the resistance and repetition exercises only as your caregiver advises. STRENGTH Wrist Flexors  Sit with your right / left forearm palm-up and fully supported. Your elbow should be resting below the height of your shoulder. Allow your wrist to extend over the edge of the surface.  Loosely holding a __________ weight or a piece of rubber exercise band/tubing, slowly curl your hand up toward your forearm.  Hold this position for __________ seconds. Slowly lower the wrist back to the starting position in a controlled manner. Repeat __________ times. Complete this exercise __________ times per day.  STRENGTH  Wrist Extensors  Sit with your right / left forearm palm-down and fully supported. Your elbow should be resting below the height of your shoulder. Allow your wrist to extend over the edge of the surface.  Loosely holding a __________ weight or a piece of rubber exercise band/tubing, slowly curl your hand up toward your forearm.  Hold this position for __________ seconds. Slowly lower the wrist back to the starting position in a controlled manner. Repeat __________ times. Complete this exercise __________ times per day.  STRENGTH - Ulnar Deviators  Stand with a ____________________ weight in your  right / left hand or sit holding on to the rubber exercise band/tubing with your opposite arm supported.  Move your wrist so that your pinkie travels toward your forearm and your thumb moves away from your forearm.  Hold this position for __________ seconds and then slowly lower the wrist back to the starting position. Repeat __________ times. Complete this exercise __________ times per day STRENGTH - Radial Deviators  Stand with a ____________________ weight in your right / left hand or sit holding on to the rubber exercise band/tubing with your arm supported.  Raise your hand upward in front of you or pull up on the rubber tubing.  Hold this position for __________ seconds and then slowly lower the wrist back to the starting position. Repeat __________ times. Complete this exercise __________ times per day. STRENGTH - Grip  Grasp a tennis ball, a dense sponge, or a large, rolled sock in your hand.  Squeeze as hard as you can without increasing any pain.  Hold this position for __________ seconds. Release your grip slowly. Repeat __________ times. Complete this exercise __________ times per day.  Document Released: 05/25/2005 Document Revised: 08/17/2011 Document Reviewed: 09/06/2008 [facility] Patient Information 2014 [place], [place].

## 2013-06-28 NOTE — Assessment & Plan Note (Addendum)
Anticipate isolated compressive R ulnar neuropathy Discussed pathophysiology. Recommended exercises as below and use of cushions if prolonged pressure on medial elbow. Update if sxs persist or new paresthesias develop - would consider further eval in h/o HIV. Pt agrees with plan.

## 2013-06-28 NOTE — Progress Notes (Signed)
Pre-visit discussion using our clinic review tool. No additional management support is needed unless otherwise documented below in the visit note.  

## 2013-06-28 NOTE — Progress Notes (Signed)
   Subjective:    Patient ID: Cassandra [name], female    DOB: 08-15-1975, 38 y.o.   MRN: [id number]  HPI CC: 6 mo f/u HTN  HTN - Compliant with current antihypertensive regimen of amlodipine 2.5mg  daily.  Does not regularly check blood pressures at home.  Denies low blood pressure readings or symptoms of dizziness/syncope.  Denies vision changes, CP/tightness, SOB, leg swelling.  BP Readings from Last 3 Encounters:  06/28/13 138/88  04/21/13 125/99  03/28/13 131/105   Wt Readings from Last 3 Encounters:  06/28/13 291 lb (131.997 kg)  04/21/13 289 lb (131.09 kg)  03/28/13 292 lb (132.45 kg)   Finger numbness - over last few days noticing some R handed numbness/paresthesias at right 5th and medial 4th fingers.  No swelling noted or pain.  Noticed some difficulty with typing and with holding cup.  Denies any history of ulnar nerve compression.  Denies injury/trauma, neck pain or radicular pain.  Smoking - continues cutting down.  Some day smoker.  Contraception - vaginal ring placed in 03/2013  Past Medical History  Diagnosis Date  . Generalized headaches     frequent  . History of gestational diabetes     first 2 pregnancies  . History of abnormal Pap smear     remote  . Morbid obesity   . HSV-2 seropositive   . Cat allergies     and dogs  . Tobacco use   . Periodontal disease 08/2011    currently getting dental work  . HIV infection     Review of Systems Per HPI    Objective:   Physical Exam  Nursing note and vitals reviewed. Constitutional: She appears well-developed and well-nourished. No distress.  obese  HENT:  Mouth/Throat: Oropharynx is clear and moist. No oropharyngeal exudate.  Cardiovascular: Normal rate, regular rhythm, normal heart sounds and intact distal pulses.   No murmur heard. Pulmonary/Chest: Effort normal and breath sounds normal. No respiratory distress. She has no wheezes. She has no rales.  Musculoskeletal: She exhibits no edema.    Neurological:  temperature sensation and light touch mildly decreased at R 5th digit. Strength intact  Skin: Skin is warm and dry. No rash noted.  Psychiatric: She has a normal mood and affect.       Assessment & Plan:

## 2013-06-28 NOTE — Assessment & Plan Note (Signed)
Deteriorated over last several checks Will increase amlodipine to 5mg  daily.

## 2013-06-29 ENCOUNTER — Telehealth: Payer: Self-pay | Admitting: Family Medicine

## 2013-06-29 ENCOUNTER — Other Ambulatory Visit: Payer: Self-pay | Admitting: *Deleted

## 2013-06-29 MED ORDER — AMLODIPINE BESYLATE 5 MG PO TABS: 5.0000 mg | ORAL_TABLET | Freq: Every day | ORAL | Status: DC

## 2013-06-29 NOTE — Telephone Encounter (Signed)
Relevant patient education assigned to patient using Emmi. ° °

## 2013-06-30 ENCOUNTER — Ambulatory Visit: Payer: Self-pay | Admitting: Family Medicine

## 2013-07-11 ENCOUNTER — Telehealth: Payer: Self-pay | Admitting: Family Medicine

## 2013-07-11 NOTE — Telephone Encounter (Signed)
Relevant patient education assigned to patient using Emmi. ° °

## 2013-07-17 ENCOUNTER — Other Ambulatory Visit: Payer: Self-pay | Admitting: Family Medicine

## 2013-08-19 ENCOUNTER — Other Ambulatory Visit: Payer: Self-pay | Admitting: Family Medicine

## 2013-09-11 MED ORDER — AMLODIPINE BESYLATE 5 MG PO TABS: ORAL_TABLET | ORAL | Status: DC

## 2013-09-11 NOTE — Telephone Encounter (Signed)
Received fax saying pt requires a #90 day supply

## 2013-09-11 NOTE — Addendum Note (Signed)
Addended by: Tammi Sou on: 09/11/2013 02:42 PM   Modules accepted: Orders

## 2013-11-10 ENCOUNTER — Ambulatory Visit (INDEPENDENT_AMBULATORY_CARE_PROVIDER_SITE_OTHER): Payer: 59 | Admitting: Family Medicine

## 2013-11-10 ENCOUNTER — Encounter: Payer: Self-pay | Admitting: Family Medicine

## 2013-11-10 VITALS — BP 135/86 | HR 76 | Ht 69.0 in | Wt 297.0 lb

## 2013-11-10 DIAGNOSIS — N946 Dysmenorrhea, unspecified: Secondary | ICD-10-CM

## 2013-11-10 DIAGNOSIS — N920 Excessive and frequent menstruation with regular cycle: Secondary | ICD-10-CM

## 2013-11-10 DIAGNOSIS — Z3009 Encounter for other general counseling and advice on contraception: Secondary | ICD-10-CM

## 2013-11-10 MED ORDER — DICLOFENAC SODIUM 75 MG PO TBEC: 75.0000 mg | DELAYED_RELEASE_TABLET | Freq: Two times a day (BID) | ORAL | Status: DC

## 2013-11-10 MED ORDER — MEGESTROL ACETATE 40 MG PO TABS: 40.0000 mg | ORAL_TABLET | Freq: Two times a day (BID) | ORAL | Status: DC

## 2013-11-10 NOTE — Patient Instructions (Signed)
Dysmenorrhea Menstrual cramps (dysmenorrhea) are caused by the muscles of the uterus tightening (contracting) during a menstrual period. For some women, this discomfort is merely bothersome. For others, dysmenorrhea can be severe enough to interfere with everyday activities for a few days each month. Primary dysmenorrhea is menstrual cramps that last a couple of days when you start having menstrual periods or soon after. This often begins after a teenager starts having her period. As a woman gets older or has a baby, the cramps will usually lessen or disappear. Secondary dysmenorrhea begins later in life, lasts longer, and the pain may be stronger than primary dysmenorrhea. The pain may start before the period and last a few days after the period.  CAUSES  Dysmenorrhea is usually caused by an underlying problem, such as:  The tissue lining the uterus grows outside of the uterus in other areas of the body (endometriosis).  The endometrial tissue, which normally lines the uterus, is found in or grows into the muscular walls of the uterus (adenomyosis).  The pelvic blood vessels are engorged with blood just before the menstrual period (pelvic congestive syndrome).  Overgrowth of cells (polyps) in the lining of the uterus or cervix.  Falling down of the uterus (prolapse) because of loose or stretched ligaments.  Depression.  Bladder problems, infection, or inflammation.  Problems with the intestine, a tumor, or irritable bowel syndrome.  Cancer of the female organs or bladder.  A severely tipped uterus.  A very tight opening or closed cervix.  Noncancerous tumors of the uterus (fibroids).  Pelvic inflammatory disease (PID).  Pelvic scarring (adhesions) from a previous surgery.  Ovarian cyst.  An intrauterine device (IUD) used for birth control. RISK FACTORS You may be at greater risk of dysmenorrhea if:  You are younger than age 62.  You started puberty early.  You have  irregular or heavy bleeding.  You have never given birth.  You have a family history of this problem.  You are a smoker. SIGNS AND SYMPTOMS   Cramping or throbbing pain in your lower abdomen.  Headaches.  Lower back pain.  Nausea or vomiting.  Diarrhea.  Sweating or dizziness.  Loose stools. DIAGNOSIS  A diagnosis is based on your history, symptoms, physical exam, diagnostic tests, or procedures. Diagnostic tests or procedures may include:  Blood tests.  Ultrasonography.  An examination of the lining of the uterus (dilation and curettage, D&C).  An examination inside your abdomen or pelvis with a scope (laparoscopy).  X-rays.  CT scan.  MRI.  An examination inside the bladder with a scope (cystoscopy).  An examination inside the intestine or stomach with a scope (colonoscopy, gastroscopy). TREATMENT  Treatment depends on the cause of the dysmenorrhea. Treatment may include:  Pain medicine prescribed by your health care provider.  Birth control pills or an IUD with progesterone hormone in it.  Hormone replacement therapy.  Nonsteroidal anti-inflammatory drugs (NSAIDs). These may help stop the production of prostaglandins.  Surgery to remove adhesions, endometriosis, ovarian cyst, or fibroids.  Removal of the uterus (hysterectomy).  Progesterone shots to stop the menstrual period.  Cutting the nerves on the sacrum that go to the female organs (presacral neurectomy).  Electric current to the sacral nerves (sacral nerve stimulation).  Antidepressant medicine.  Psychiatric therapy, counseling, or group therapy.  Exercise and physical therapy.  Meditation and yoga therapy.  Acupuncture. HOME CARE INSTRUCTIONS   Only take over-the-counter or prescription medicines as directed by your health care provider.  Place a heating pad  or hot water bottle on your lower back or abdomen. Do not sleep with the heating pad.  Use aerobic exercises, walking,  swimming, biking, and other exercises to help lessen the cramping.  Massage to the lower back or abdomen may help.  Stop smoking.  Avoid alcohol and caffeine. SEEK MEDICAL CARE IF:   Your pain does not get better with medicine.  You have pain with sexual intercourse.  Your pain increases and is not controlled with medicines.  You have abnormal vaginal bleeding with your period.  You develop nausea or vomiting with your period that is not controlled with medicine. SEEK IMMEDIATE MEDICAL CARE IF:  You pass out.  Document Released: 05/25/2005 Document Revised: 01/25/2013 Document Reviewed: 11/10/2012 [facility] Patient Information 2014 [place]. Menorrhagia Menorrhagia is the medical term for when your menstrual periods are heavy or last longer than usual. With menorrhagia, every period you have may cause enough blood loss and cramping that you are unable to maintain your usual activities. CAUSES  In some cases, the cause of heavy periods is unknown, but a number of conditions may cause menorrhagia. Common causes include:  A problem with the hormone-producing thyroid gland (hypothyroid).  Noncancerous growths in the uterus (polyps or fibroids).  An imbalance of the estrogen and progesterone hormones.  One of your ovaries not releasing an egg during one or more months.  Side effects of having an intrauterine device (IUD).  Side effects of some medicines, such as anti-inflammatory medicines or blood thinners.  A bleeding disorder that stops your blood from clotting normally. SIGNS AND SYMPTOMS  During a normal period, bleeding lasts between 4 and 8 days. Signs that your periods are too heavy include:  You routinely have to change your pad or tampon every 1 or 2 hours because it is completely soaked.  You pass blood clots larger than 1 inch (2.5 cm) in size.  You have bleeding for more than 7 days.  You need to use pads and tampons at the same time because of heavy  bleeding.  You need to wake up to change your pads or tampons during the night.  You have symptoms of anemia, such as tiredness, fatigue, or shortness of breath. DIAGNOSIS  Your health care provider will perform a physical exam and ask you questions about your symptoms and menstrual history. Other tests may be ordered based on what the health care provider finds during the exam. These tests can include:  Blood tests To check if you are pregnant or have hormonal changes, a bleeding or thyroid disorder, low iron levels (anemia), or other problems.  Endometrial biopsy Your health care provider takes a sample of tissue from the inside of your uterus to be examined under a microscope.  Pelvic ultrasound This test uses sound waves to make a picture of your uterus, ovaries, and vagina. The pictures can show if you have fibroids or other growths.  Hysteroscopy For this test, your health care provider will use a small telescope to look inside your uterus. Based on the results of your initial tests, your health care provider may recommend further testing. TREATMENT  Treatment may not be needed. If it is needed, your health care provider may recommend treatment with one or more medicines first. If these do not reduce bleeding enough, a surgical treatment might be an option. The best treatment for you will depend on:   Whether you need to prevent pregnancy.  Your desire to have children in the future.  The cause and  severity of your bleeding.  Your opinion and personal preference.  Medicines for menorrhagia may include:  Birth control methods that use hormones These include the pill, skin patch, vaginal ring, shots that you get every 3 months, hormonal IUD, and implant. These treatments reduce bleeding during your menstrual period.  Medicines that thicken blood and slow bleeding.  Medicines that reduce swelling, such as ibuprofen.  Medicines that contain a synthetic hormone called  progestin.   Medicines that make the ovaries stop working for a short time.  You may need surgical treatment for menorrhagia if the medicines are unsuccessful. Treatment options include:  Dilation and curettage (D&C) In this procedure, your health care provider opens (dilates) your cervix and then scrapes or suctions tissue from the lining of your uterus to reduce menstrual bleeding.  Operative hysteroscopy This procedure uses a tiny tube with a light (hysteroscope) to view your uterine cavity and can help in the surgical removal of a polyp that may be causing heavy periods.  Endometrial ablation Through various techniques, your health care provider permanently destroys the entire lining of your uterus (endometrium). After endometrial ablation, most women have little or no menstrual flow. Endometrial ablation reduces your ability to become pregnant.  Endometrial resection This surgical procedure uses an electrosurgical wire loop to remove the lining of the uterus. This procedure also reduces your ability to become pregnant.  Hysterectomy Surgical removal of the uterus and cervix is a permanent procedure that stops menstrual periods. Pregnancy is not possible after a hysterectomy. This procedure requires anesthesia and hospitalization. HOME CARE INSTRUCTIONS   Only take over-the-counter or prescription medicines as directed by your health care provider. Take prescribed medicines exactly as directed. Do not change or switch medicines without consulting your health care provider.  Take any prescribed iron pills exactly as directed by your health care provider. Long-term heavy bleeding may result in low iron levels. Iron pills help replace the iron your body lost from heavy bleeding. Iron may cause constipation. If this becomes a problem, increase the bran, fruits, and roughage in your diet.  Do not take aspirin or medicines that contain aspirin 1 week before or during your menstrual period.  Aspirin may make the bleeding worse.  If you need to change your sanitary pad or tampon more than once every 2 hours, stay in bed and rest as much as possible until the bleeding stops.  Eat well-balanced meals. Eat foods high in iron. Examples are leafy green vegetables, meat, liver, eggs, and whole grain breads and cereals. Do not try to lose weight until the abnormal bleeding has stopped and your blood iron level is back to normal. SEEK MEDICAL CARE IF:   You soak through a pad or tampon every 1 or 2 hours, and this happens every time you have a period.  You need to use pads and tampons at the same time because you are bleeding so much.  You need to change your pad or tampon during the night.  You have a period that lasts for more than 8 days.  You pass clots bigger than 1 inch wide.  You have irregular periods that happen more or less often than once a month.  You feel dizzy or faint.  You feel very weak or tired.  You feel short of breath or feel your heart is beating too fast when you exercise.  You have nausea and vomiting or diarrhea while you are taking your medicine.  You have any problems that may be related  to the medicine you are taking. SEEK IMMEDIATE MEDICAL CARE IF:   You soak through 4 or more pads or tampons in 2 hours.  You have any bleeding while you are pregnant. MAKE SURE YOU:   Understand these instructions.  Will watch your condition.  Will get help right away if you are not doing well or get worse. Document Released: 05/25/2005 Document Revised: 03/15/2013 Document Reviewed: 11/13/2012 [facility] Patient Information 2014 [place].

## 2013-11-10 NOTE — Progress Notes (Signed)
To discuss contraception options.  She currently has Mirena and her periods have now started coming once a month like they are supposed to be but they are still very heavy and painful.

## 2013-11-10 NOTE — Progress Notes (Signed)
    Subjective:    Patient ID: Cassandra [name] is a 38 y.o. female presenting with Contraception  on 11/10/2013  HPI: Reports with Mirena she is having regular cycles, q 28 days. Still having heavy bleeding and lots of cramping. Bleeds for 5 days. Reports passage of clots and pain in right hip with radiation into legs. Pt. With Nml EMB and pelvic sono.  Nml TSH. Has tried and failed Megace.  Also having headaches and mood swings. Pt's age and smoking status make her not n ideal candidate for OC's.  Also, to consider, BP and weight, if she quits smoking.  She is very much uninterested in hysterectomy.    Review of Systems  Constitutional: Negative for fever and chills.  Respiratory: Negative for shortness of breath.   Cardiovascular: Negative for chest pain.  Gastrointestinal: Negative for nausea, vomiting and abdominal pain.  Genitourinary: Negative for dysuria.  Skin: Negative for rash.      Objective:    BP 135/86  Pulse 76  Ht 5\' 9"  (1.753 m)  Wt 297 lb (134.718 kg)  BMI 43.84 kg/m2  LMP 11/02/2013 Physical Exam  Constitutional: She appears well-developed and well-nourished.  obese  HENT:  Head: Normocephalic and atraumatic.  Eyes: No scleral icterus.  Neck: Neck supple.  Cardiovascular: Normal rate.   Pulmonary/Chest: Effort normal.  Abdominal: Soft.        Assessment & Plan:  Heavy menstrual bleeding Offered second IUD or endometrial ablation. Pt. Is s/p BTL.  Advised of increased risk of failure and needing further treatment, given young age and inability to collect specimens following procedure.  Pt. Desires continued use of Mirena, + addition of Diclofenac.     Return for a follow-up.

## 2013-11-13 NOTE — Assessment & Plan Note (Signed)
Offered second IUD or endometrial ablation. Pt. Is s/p BTL.  Advised of increased risk of failure and needing further treatment, given young age and inability to collect specimens following procedure.  Pt. Desires continued use of Mirena, + addition of Diclofenac.

## 2013-11-27 ENCOUNTER — Ambulatory Visit (INDEPENDENT_AMBULATORY_CARE_PROVIDER_SITE_OTHER): Payer: 59 | Admitting: Family Medicine

## 2013-11-27 ENCOUNTER — Encounter: Payer: Self-pay | Admitting: Family Medicine

## 2013-11-27 VITALS — BP 130/80 | HR 84 | Temp 98.1°F | Wt 293.5 lb

## 2013-11-27 DIAGNOSIS — R51 Headache: Secondary | ICD-10-CM

## 2013-11-27 DIAGNOSIS — I10 Essential (primary) hypertension: Secondary | ICD-10-CM

## 2013-11-27 DIAGNOSIS — R519 Headache, unspecified: Secondary | ICD-10-CM | POA: Insufficient documentation

## 2013-11-27 MED ORDER — CYCLOBENZAPRINE HCL 10 MG PO TABS: 5.0000 mg | ORAL_TABLET | Freq: Two times a day (BID) | ORAL | Status: DC | PRN

## 2013-11-27 NOTE — Assessment & Plan Note (Signed)
Chronic, stable. Continue amlodipine 5mg daily. 

## 2013-11-27 NOTE — Progress Notes (Signed)
Pre visit review using our clinic review tool, if applicable. No additional management support is needed unless otherwise documented below in the visit note. 

## 2013-11-27 NOTE — Assessment & Plan Note (Signed)
Sound like migraines. Discussed different treatment options (abortive and ppx). Will try and taper off NSAID analgesic, use flexeril abortively. Discussed sedation precautions with flexeril. Update if not improving with this. Pt agrees with plan.

## 2013-11-27 NOTE — Progress Notes (Signed)
BP 130/80  Pulse 84  Temp(Src) 98.1 F (36.7 C) (Oral)  Wt 293 lb 8 oz (133.131 kg)  LMP 11/02/2013   CC: 5 mo f/u HTN  Subjective:    Patient ID: Cassandra [name], female    DOB: Dec 24, 1975, 38 y.o.   MRN: [id number]  HPI: Cassandra [name] is a 38 y.o. female presenting on 11/27/2013 for Follow-up   Persistent headaches - has had intermittently for years. Throbbing in nature. ++photophobia. Mild phonophobia, no nausea. Lays down in calm quiet dark place to help. At times activity limiting. Improves with ibuprofen. H/o migraines in the past, never seeked treatment. HA progresses as day progresses - worse at 3-4 pm. Describes as pressure with throbbing behind L eye. Pretty constant over last 2 weeks. Prior to this, was getting HA every other month. At times takes 800mg  ibuprofen several times a day to help control headaches. No fevers/chills, double vision.  HTN - Compliant with current antihypertensive regimen of amlodipine 2.5mg  daily. Does not regularly check blood pressures at home. Denies low blood pressure readings or symptoms of dizziness/syncope. Denies vision changes, CP/tightness, SOB, leg swelling.  BP Readings from Last 3 Encounters:  11/27/13 130/80  11/10/13 135/86  06/28/13 138/88    Wt Readings from Last 3 Encounters:  11/27/13 293 lb 8 oz (133.131 kg)  11/10/13 297 lb (134.718 kg)  06/28/13 291 lb (131.997 kg)    Dysmenorrhea - takes voltaren around cycle - heavy bleeding with cramping. LMP 11/02/2013  Relevant past medical, surgical, family and social history reviewed and updated as indicated.  Allergies and medications reviewed and updated. Current Outpatient Prescriptions on File Prior to Visit  Medication Sig  . amLODipine (NORVASC) 5 MG tablet TAKE 1 TABLET (5 MG TOTAL) BY MOUTH DAILY.  Marland Kitchen efavirenz-emtricitabine-tenofovir (ATRIPLA) 600-200-300 MG per tablet Take 1 tablet by mouth at bedtime.  Marland Kitchen Fexofenadine HCl (ALLEGRA PO) Take by mouth daily as needed.    Marland Kitchen levonorgestrel (MIRENA) 20 MCG/24HR IUD 1 each by Intrauterine route once.  . Loratadine (CLARITIN PO) Take by mouth daily as needed.   . Multiple Vitamins-Minerals (MULTIVITAMIN PO) Take 1 tablet by mouth daily.  Marland Kitchen omeprazole (PRILOSEC OTC) 20 MG tablet Take 20 mg by mouth 2 (two) times daily.  Marland Kitchen albuterol (VENTOLIN HFA) 108 (90 BASE) MCG/ACT inhaler Inhale 2 puffs into the lungs every 6 (six) hours as needed for wheezing.  . diclofenac (VOLTAREN) 75 MG EC tablet Take 1 tablet (75 mg total) by mouth 2 (two) times daily with a meal.   No current facility-administered medications on file prior to visit.    Review of Systems Per HPI unless specifically indicated above    Objective:    BP 130/80  Pulse 84  Temp(Src) 98.1 F (36.7 C) (Oral)  Wt 293 lb 8 oz (133.131 kg)  LMP 11/02/2013  Physical Exam  Nursing note and vitals reviewed. Constitutional: She is oriented to person, place, and time. She appears well-developed and well-nourished. No distress.  HENT:  Head: Normocephalic and atraumatic.  Mouth/Throat: Oropharynx is clear and moist. No oropharyngeal exudate.  Cardiovascular: Normal rate, regular rhythm, normal heart sounds and intact distal pulses.   No murmur heard. Pulmonary/Chest: Effort normal and breath sounds normal. No respiratory distress. She has no wheezes. She has no rales.  Musculoskeletal: She exhibits no edema.  Neurological: She is alert and oriented to person, place, and time. She has normal strength. No sensory deficit. She displays a negative Romberg sign.  CN 2-12  intact FTN intact  Skin: Skin is warm and dry. No rash noted.       Assessment & Plan:   Problem List Items Addressed This Visit   Hypertension - Primary     Chronic, stable. Continue amlodipine 5mg  daily.    Headache(784.0)     Sound like migraines. Discussed different treatment options (abortive and ppx). Will try and taper off NSAID analgesic, use flexeril abortively. Discussed  sedation precautions with flexeril. Update if not improving with this. Pt agrees with plan.    Relevant Medications      cyclobenzaprine (FLEXERIL) tablet       Follow up plan: Return in about 6 months (around 05/29/2014), or as needed, for annual exam, prior fasting for blood work.

## 2013-11-27 NOTE — Patient Instructions (Addendum)
Headaches sound like migraines. Let's try and back off ibuprofen or tylenol, may use flexeril muscle relaxant instead to help stop headache - caution this can make you sleepy (try 1/2 tab first). Let me know if headaches not improving or any other concerns with med. Good to see you today, call us with quesitons. Return in 6 months for fasting blood work and afterwards for physical.  Migraine Headache A migraine headache is an intense, throbbing pain on one or both sides of your head. A migraine can last for 30 minutes to several hours. CAUSES  The exact cause of a migraine headache is not always known. However, a migraine may be caused when nerves in the brain become irritated and release chemicals that cause inflammation. This causes pain. Certain things may also trigger migraines, such as:  Alcohol.  Smoking.  Stress.  Menstruation.  Aged cheeses.  Foods or drinks that contain nitrates, glutamate, aspartame, or tyramine.  Lack of sleep.  Chocolate.  Caffeine.  Hunger.  Physical exertion.  Fatigue.  Medicines used to treat chest pain (nitroglycerine), birth control pills, estrogen, and some blood pressure medicines. SIGNS AND SYMPTOMS  Pain on one or both sides of your head.  Pulsating or throbbing pain.  Severe pain that prevents daily activities.  Pain that is aggravated by any physical activity.  Nausea, vomiting, or both.  Dizziness.  Pain with exposure to bright lights, loud noises, or activity.  General sensitivity to bright lights, loud noises, or smells. Before you get a migraine, you may get warning signs that a migraine is coming (aura). An aura may include:  Seeing flashing lights.  Seeing bright spots, halos, or zig-zag lines.  Having tunnel vision or blurred vision.  Having feelings of numbness or tingling.  Having trouble talking.  Having muscle weakness. DIAGNOSIS  A migraine headache is often diagnosed based  on:  Symptoms.  Physical exam.  A CT scan or MRI of your head. These imaging tests cannot diagnose migraines, but they can help rule out other causes of headaches. TREATMENT Medicines may be given for pain and nausea. Medicines can also be given to help prevent recurrent migraines.  HOME CARE INSTRUCTIONS  Only take over-the-counter or prescription medicines for pain or discomfort as directed by your health care provider. The use of long-term narcotics is not recommended.  Lie down in a dark, quiet room when you have a migraine.  Keep a journal to find out what may trigger your migraine headaches. For example, write down:  What you eat and drink.  How much sleep you get.  Any change to your diet or medicines.  Limit alcohol consumption.  Quit smoking if you smoke.  Get 7-9 hours of sleep, or as recommended by your health care provider.  Limit stress.  Keep lights dim if bright lights bother you and make your migraines worse. SEEK IMMEDIATE MEDICAL CARE IF:   Your migraine becomes severe.  You have a fever.  You have a stiff neck.  You have vision loss.  You have muscular weakness or loss of muscle control.  You start losing your balance or have trouble walking.  You feel faint or pass out.  You have severe symptoms that are different from your first symptoms. MAKE SURE YOU:   Understand these instructions.  Will watch your condition.  Will get help right away if you are not doing well or get worse. Document Released: 05/25/2005 Document Revised: 03/15/2013 Document Reviewed: 01/30/2013 [facility] Patient Information 2015 [place], [place]. This  information is not intended to replace advice given to you by your health care provider. Make sure you discuss any questions you have with your health care provider.  

## 2014-01-21 ENCOUNTER — Other Ambulatory Visit: Payer: Self-pay | Admitting: Family Medicine

## 2014-01-22 NOTE — Telephone Encounter (Signed)
Ok to refill 

## 2014-02-27 ENCOUNTER — Ambulatory Visit (INDEPENDENT_AMBULATORY_CARE_PROVIDER_SITE_OTHER): Payer: 59 | Admitting: Family Medicine

## 2014-02-27 ENCOUNTER — Encounter: Payer: Self-pay | Admitting: Family Medicine

## 2014-02-27 VITALS — BP 117/106 | HR 101 | Temp 97.2°F | Ht 69.0 in | Wt 287.0 lb

## 2014-02-27 DIAGNOSIS — Z23 Encounter for immunization: Secondary | ICD-10-CM

## 2014-02-27 DIAGNOSIS — N949 Unspecified condition associated with female genital organs and menstrual cycle: Secondary | ICD-10-CM

## 2014-02-27 DIAGNOSIS — O24919 Unspecified diabetes mellitus in pregnancy, unspecified trimester: Secondary | ICD-10-CM | POA: Insufficient documentation

## 2014-02-27 DIAGNOSIS — N925 Other specified irregular menstruation: Secondary | ICD-10-CM

## 2014-02-27 NOTE — Progress Notes (Signed)
    Subjective:    Patient ID: Cassandra [name] is a 38 y.o. female presenting with Follow-up  on 02/27/2014  HPI: Is taking Megace for irregular bleeding.  Started it 6/22-mid July with light bleeding the whole time. Taking Megace 2x/day for 7 days. Last 2 cycles were more tolerable and were regular.  Review of Systems  Constitutional: Negative for fever and chills.  HENT: Positive for rhinorrhea and sore throat.   Respiratory: Positive for cough. Negative for shortness of breath.   Cardiovascular: Negative for chest pain.  Gastrointestinal: Negative for nausea, vomiting and abdominal pain.  Genitourinary: Negative for dysuria.  Skin: Negative for rash.      Objective:    BP 117/106  Pulse 101  Temp(Src) 97.2 F (36.2 C)  Ht 5\' 9"  (1.753 m)  Wt 287 lb (130.182 kg)  BMI 42.36 kg/m2  LMP 02/17/2014 Physical Exam  Constitutional: She is oriented to person, place, and time. She appears well-developed and well-nourished. No distress.  HENT:  Head: Normocephalic and atraumatic.  Eyes: No scleral icterus.  Neck: Neck supple.  Cardiovascular: Normal rate.   Pulmonary/Chest: Effort normal.  Abdominal: Soft.  Neurological: She is alert and oriented to person, place, and time.  Skin: Skin is warm and dry.  Psychiatric: She has a normal mood and affect.        Assessment & Plan:     Heavy menstrual bleeding Improved for now--will try for a few more months and see.   Return in about 6 months (around 08/28/2014).

## 2014-02-27 NOTE — Progress Notes (Signed)
Patient here for follow-up for irregular bleeding.  September is the first month that has been a regular cycle.  Has productive cough and sinus issues.

## 2014-02-27 NOTE — Assessment & Plan Note (Signed)
Improved for now--will try for a few more months and see.

## 2014-02-27 NOTE — Patient Instructions (Signed)

## 2014-04-09 ENCOUNTER — Encounter: Payer: Self-pay | Admitting: Family Medicine

## 2014-05-13 ENCOUNTER — Other Ambulatory Visit: Payer: Self-pay | Admitting: Family Medicine

## 2014-05-13 DIAGNOSIS — I10 Essential (primary) hypertension: Secondary | ICD-10-CM

## 2014-05-13 DIAGNOSIS — B20 Human immunodeficiency virus [HIV] disease: Secondary | ICD-10-CM

## 2014-05-17 ENCOUNTER — Other Ambulatory Visit (INDEPENDENT_AMBULATORY_CARE_PROVIDER_SITE_OTHER): Payer: 59

## 2014-05-17 DIAGNOSIS — B20 Human immunodeficiency virus [HIV] disease: Secondary | ICD-10-CM

## 2014-05-17 DIAGNOSIS — Z21 Asymptomatic human immunodeficiency virus [HIV] infection status: Secondary | ICD-10-CM

## 2014-05-17 DIAGNOSIS — I10 Essential (primary) hypertension: Secondary | ICD-10-CM

## 2014-05-17 LAB — COMPREHENSIVE METABOLIC PANEL
ALT: 34 U/L (ref 0–35)
AST: 31 U/L (ref 0–37)
Albumin: 3.8 g/dL (ref 3.5–5.2)
Alkaline Phosphatase: 75 U/L (ref 39–117)
BUN: 8 mg/dL (ref 6–23)
CO2: 27 mEq/L (ref 19–32)
Calcium: 8.7 mg/dL (ref 8.4–10.5)
Chloride: 105 mEq/L (ref 96–112)
Creatinine, Ser: 0.8 mg/dL (ref 0.4–1.2)
GFR: 89.73 mL/min (ref >60.00)
Glucose, Bld: 117 mg/dL — ABNORMAL HIGH (ref 70–99)
Potassium: 4 mEq/L (ref 3.5–5.1)
Sodium: 138 mEq/L (ref 135–145)
Total Bilirubin: 0.5 mg/dL (ref 0.2–1.2)
Total Protein: 7 g/dL (ref 6.0–8.3)

## 2014-05-17 LAB — CBC WITH DIFFERENTIAL/PLATELET
Basophils Absolute: 0 10*3/uL (ref 0.0–0.1)
Basophils Relative: 0.4 % (ref 0.0–3.0)
Eosinophils Absolute: 0.2 10*3/uL (ref 0.0–0.7)
Eosinophils Relative: 2.7 % (ref 0.0–5.0)
HCT: 39.4 % (ref 36.0–46.0)
Hemoglobin: 13 g/dL (ref 12.0–15.0)
Lymphocytes Relative: 30.3 % (ref 12.0–46.0)
Lymphs Abs: 2 10*3/uL (ref 0.7–4.0)
MCHC: 33 g/dL (ref 30.0–36.0)
MCV: 98.9 fl (ref 78.0–100.0)
Monocytes Absolute: 0.4 10*3/uL (ref 0.1–1.0)
Monocytes Relative: 6.8 % (ref 3.0–12.0)
Neutro Abs: 4 10*3/uL (ref 1.4–7.7)
Neutrophils Relative %: 59.8 % (ref 43.0–77.0)
Platelets: 266 10*3/uL (ref 150.0–400.0)
RBC: 3.98 Mil/uL (ref 3.87–5.11)
RDW: 12.1 % (ref 11.5–15.5)
WBC: 6.6 10*3/uL (ref 4.0–10.5)

## 2014-05-17 LAB — LIPID PANEL
Cholesterol: 178 mg/dL (ref 0–200)
HDL: 57.8 mg/dL (ref >39.00)
LDL Cholesterol: 106 mg/dL — ABNORMAL HIGH (ref 0–99)
NonHDL: 120.2
Total CHOL/HDL Ratio: 3
Triglycerides: 71 mg/dL (ref 0.0–149.0)
VLDL: 14.2 mg/dL (ref 0.0–40.0)

## 2014-05-18 ENCOUNTER — Other Ambulatory Visit: Payer: Self-pay

## 2014-05-25 ENCOUNTER — Ambulatory Visit (INDEPENDENT_AMBULATORY_CARE_PROVIDER_SITE_OTHER): Payer: 59 | Admitting: Family Medicine

## 2014-05-25 ENCOUNTER — Encounter: Payer: Self-pay | Admitting: Family Medicine

## 2014-05-25 VITALS — BP 122/88 | HR 82 | Temp 97.7°F | Ht 68.0 in | Wt 297.0 lb

## 2014-05-25 DIAGNOSIS — R739 Hyperglycemia, unspecified: Secondary | ICD-10-CM

## 2014-05-25 DIAGNOSIS — Z0001 Encounter for general adult medical examination with abnormal findings: Secondary | ICD-10-CM | POA: Insufficient documentation

## 2014-05-25 DIAGNOSIS — Z0000 Encounter for general adult medical examination without abnormal findings: Secondary | ICD-10-CM | POA: Insufficient documentation

## 2014-05-25 DIAGNOSIS — F39 Unspecified mood [affective] disorder: Secondary | ICD-10-CM

## 2014-05-25 DIAGNOSIS — I10 Essential (primary) hypertension: Secondary | ICD-10-CM

## 2014-05-25 DIAGNOSIS — B20 Human immunodeficiency virus [HIV] disease: Secondary | ICD-10-CM

## 2014-05-25 NOTE — Progress Notes (Signed)
BP 122/88 mmHg  Pulse 82  Temp(Src) 97.7 F (36.5 C) (Tympanic)  Ht 5\' 8"  (1.727 m)  Wt 297 lb (134.718 kg)  BMI 45.17 kg/m2  SpO2 98%   CC: CPE  Subjective:    Patient ID: Cassandra [name], female    DOB: 1975/10/15, 38 y.o.   MRN: [id number]  HPI: Cassandra [name] is a 38 y.o. female presenting on 05/25/2014 for Annual Exam   On megace for irregular bleeding per OBGYN Dr Kennon Rounds. Started 11/2013.   GERD - omeprazole 20mg  bid prn.  Morbid obesity - 10lb weight gain since September. Works 12-9pm, lunch 4-5pm. No regular exercise. Drinks mt dew.   Mood disorder - intermittent depression. Prior saw Pervis Hocking. Trouble with finding motivation to get things done. Declines medication for this. May call Dr Rexene Edison to re establish. Tearful with discussion of stressors. would like to restart swimming.  Preventative: Well woman with Dr. Elly Modena - 09/07/2012. Normal pap smear, high risk HPV not detected. All normal in past. Next likely due 09/2015 Flu 02/2014 Tdap 12/2012 LMP 05/11/2014 Seat belt use discussed  Sunscreen use discussed, no changing moles on skin.   Lives with husband and 2 children, no pets Occupation: call center rep Edu: some college Activity: no regular exercise. Tries to walk around building. Diet: drinking mt dew in am, fruits/vegetables daily   Relevant past medical, surgical, family and social history reviewed and updated as indicated. Interim medical history since our last visit reviewed. Allergies and medications reviewed and updated. Current Outpatient Prescriptions on File Prior to Visit  Medication Sig  . amLODipine (NORVASC) 5 MG tablet TAKE 1 TABLET (5 MG TOTAL) BY MOUTH DAILY.  . cyclobenzaprine (FLEXERIL) 10 MG tablet TAKE 0.5-1 TABLETS (5-10 MG TOTAL) BY MOUTH 2 (TWO) TIMES DAILY AS NEEDED (HEADACHE).  Marland Kitchen diclofenac (VOLTAREN) 75 MG EC tablet Take 1 tablet (75 mg total) by mouth 2 (two) times daily with a meal.  . efavirenz-emtricitabine-tenofovir  (ATRIPLA) 600-200-300 MG per tablet Take 1 tablet by mouth at bedtime.  Marland Kitchen Fexofenadine HCl (ALLEGRA PO) Take by mouth daily as needed.   . Loratadine (CLARITIN PO) Take by mouth daily as needed.   . megestrol (MEGACE) 40 MG tablet   . Multiple Vitamins-Minerals (MULTIVITAMIN PO) Take 1 tablet by mouth daily.  Marland Kitchen omeprazole (PRILOSEC OTC) 20 MG tablet Take 20 mg by mouth daily as needed.    No current facility-administered medications on file prior to visit.    Review of Systems  Constitutional: Negative for fever, chills, activity change, appetite change, fatigue and unexpected weight change (weight gain noted).  HENT: Negative for hearing loss.   Eyes: Negative for visual disturbance.  Respiratory: Negative for cough, chest tightness, shortness of breath and wheezing.   Cardiovascular: Positive for leg swelling (ankle swelling noted). Negative for chest pain and palpitations.  Gastrointestinal: Positive for constipation. Negative for nausea, vomiting, abdominal pain, diarrhea, blood in stool and abdominal distention.  Genitourinary: Negative for hematuria and difficulty urinating.  Musculoskeletal: Negative for myalgias, arthralgias and neck pain.  Skin: Negative for rash.  Neurological: Positive for headaches. Negative for dizziness, seizures and syncope.  Hematological: Negative for adenopathy. Does not bruise/bleed easily.  Psychiatric/Behavioral: Negative for dysphoric mood. The patient is not nervous/anxious.    Per HPI unless specifically indicated above     Objective:    BP 122/88 mmHg  Pulse 82  Temp(Src) 97.7 F (36.5 C) (Tympanic)  Ht 5\' 8"  (1.727 m)  Wt 297 lb (134.718  kg)  BMI 45.17 kg/m2  SpO2 98%  Wt Readings from Last 3 Encounters:  05/25/14 297 lb (134.718 kg)  02/27/14 287 lb (130.182 kg)  11/27/13 293 lb 8 oz (133.131 kg)    Physical Exam  Constitutional: She is oriented to person, place, and time. She appears well-developed and well-nourished. No  distress.  Morbidly obese  HENT:  Head: Normocephalic and atraumatic.  Right Ear: Hearing, tympanic membrane, external ear and ear canal normal.  Left Ear: Hearing, tympanic membrane, external ear and ear canal normal.  Nose: Nose normal.  Mouth/Throat: Uvula is midline, oropharynx is clear and moist and mucous membranes are normal. No oropharyngeal exudate, posterior oropharyngeal edema or posterior oropharyngeal erythema.  Eyes: Conjunctivae and EOM are normal. Pupils are equal, round, and reactive to light. No scleral icterus.  Neck: Normal range of motion. Neck supple. No thyromegaly present.  Cardiovascular: Normal rate, regular rhythm, normal heart sounds and intact distal pulses.   No murmur heard. Pulses:      Radial pulses are 2+ on the right side, and 2+ on the left side.  Pulmonary/Chest: Effort normal and breath sounds normal. No respiratory distress. She has no wheezes. She has no rales.  Abdominal: Soft. Bowel sounds are normal. She exhibits no distension and no mass. There is no tenderness. There is no rebound and no guarding.  Musculoskeletal: Normal range of motion. She exhibits no edema.  Lymphadenopathy:    She has no cervical adenopathy.  Neurological: She is alert and oriented to person, place, and time.  CN grossly intact, station and gait intact  Skin: Skin is warm and dry. No rash noted.  Psychiatric: She has a normal mood and affect. Her behavior is normal. Judgment and thought content normal.  Tearful with stressors  Nursing note and vitals reviewed.  Results for orders placed or performed in visit on 05/17/14  Lipid panel  Result Value Ref Range   Cholesterol 178 0 - 200 mg/dL   Triglycerides 71.0 0.0 - 149.0 mg/dL   HDL 57.80 >39.00 mg/dL   VLDL 14.2 0.0 - 40.0 mg/dL   LDL Cholesterol 106 (H) 0 - 99 mg/dL   Total CHOL/HDL Ratio 3    NonHDL 120.20   Comprehensive metabolic panel  Result Value Ref Range   Sodium 138 135 - 145 mEq/L   Potassium 4.0 3.5  - 5.1 mEq/L   Chloride 105 96 - 112 mEq/L   CO2 27 19 - 32 mEq/L   Glucose, Bld 117 (H) 70 - 99 mg/dL   BUN 8 6 - 23 mg/dL   Creatinine, Ser 0.8 0.4 - 1.2 mg/dL   Total Bilirubin 0.5 0.2 - 1.2 mg/dL   Alkaline Phosphatase 75 39 - 117 U/L   AST 31 0 - 37 U/L   ALT 34 0 - 35 U/L   Total Protein 7.0 6.0 - 8.3 g/dL   Albumin 3.8 3.5 - 5.2 g/dL   Calcium 8.7 8.4 - 10.5 mg/dL   GFR 89.73 >60.00 mL/min  CBC with Differential  Result Value Ref Range   WBC 6.6 4.0 - 10.5 K/uL   RBC 3.98 3.87 - 5.11 Mil/uL   Hemoglobin 13.0 12.0 - 15.0 g/dL   HCT 39.4 36.0 - 46.0 %   MCV 98.9 78.0 - 100.0 fl   MCHC 33.0 30.0 - 36.0 g/dL   RDW 12.1 11.5 - 15.5 %   Platelets 266.0 150.0 - 400.0 K/uL   Neutrophils Relative % 59.8 43.0 - 77.0 %  Lymphocytes Relative 30.3 12.0 - 46.0 %   Monocytes Relative 6.8 3.0 - 12.0 %   Eosinophils Relative 2.7 0.0 - 5.0 %   Basophils Relative 0.4 0.0 - 3.0 %   Neutro Abs 4.0 1.4 - 7.7 K/uL   Lymphs Abs 2.0 0.7 - 4.0 K/uL   Monocytes Absolute 0.4 0.1 - 1.0 K/uL   Eosinophils Absolute 0.2 0.0 - 0.7 K/uL   Basophils Absolute 0.0 0.0 - 0.1 K/uL      Assessment & Plan:   Problem List Items Addressed This Visit    Morbid obesity    Reviewed diet and lifestyle changes to help with weight loss.    Mood disorder    Discussed with patient - suggested she re establish with Dr Rexene Edison as counseling helped previously. Also suggested she find time for swimming which has been therapeutic for her in past, ideal as healthy form of stress relief, and activity.    Hypertension    Chronic, stable. Continue amloidpine 5mg  daily.    Hyperglycemia    again reviewed #s. Discussed importance of glycemic control and decreasing added sugars in diet.    HIV (human immunodeficiency virus infection)    Stable on atripla. Continue     Health maintenance examination - Primary    Preventative protocols reviewed and updated unless pt declined. Discussed healthy diet and lifestyle.           Follow up plan: Return in about 1 year (around 05/26/2015), or as needed, for follow up visit.

## 2014-05-25 NOTE — Assessment & Plan Note (Signed)
Stable on atripla. Continue

## 2014-05-25 NOTE — Progress Notes (Signed)
Pre visit review using our clinic review tool, if applicable. No additional management support is needed unless otherwise documented below in the visit note. 

## 2014-05-25 NOTE — Assessment & Plan Note (Signed)
Preventative protocols reviewed and updated unless pt declined. Discussed healthy diet and lifestyle.  

## 2014-05-25 NOTE — Patient Instructions (Addendum)
Think about incorporating swimming into routine. Think about checking in with Dr Rexene Edison. Keep working towards decreased added sugars in your diet as your sugar level was too high for a fasting reading. Good to see you today, call us with questions. Return as needed or in 1 year for next physical.

## 2014-05-25 NOTE — Assessment & Plan Note (Signed)
Chronic, stable. Continue amloidpine 5mg  daily.

## 2014-05-25 NOTE — Assessment & Plan Note (Signed)
again reviewed #s. Discussed importance of glycemic control and decreasing added sugars in diet.

## 2014-05-25 NOTE — Assessment & Plan Note (Signed)
Reviewed diet and lifestyle changes to help with weight loss.

## 2014-05-25 NOTE — Assessment & Plan Note (Signed)
Discussed with patient - suggested she re establish with Dr Rexene Edison as counseling helped previously. Also suggested she find time for swimming which has been therapeutic for her in past, ideal as healthy form of stress relief, and activity.

## 2014-06-12 ENCOUNTER — Other Ambulatory Visit: Payer: Self-pay | Admitting: Family Medicine

## 2014-09-17 ENCOUNTER — Emergency Department: Payer: Self-pay | Admitting: Emergency Medicine

## 2014-11-26 ENCOUNTER — Other Ambulatory Visit: Payer: Self-pay

## 2014-11-26 DIAGNOSIS — R739 Hyperglycemia, unspecified: Secondary | ICD-10-CM

## 2014-11-29 ENCOUNTER — Other Ambulatory Visit (INDEPENDENT_AMBULATORY_CARE_PROVIDER_SITE_OTHER): Payer: PRIVATE HEALTH INSURANCE

## 2014-11-29 DIAGNOSIS — R739 Hyperglycemia, unspecified: Secondary | ICD-10-CM

## 2014-11-29 LAB — HEMOGLOBIN A1C: Hgb A1c MFr Bld: 5.3 % (ref 4.6–6.5)

## 2014-11-30 ENCOUNTER — Other Ambulatory Visit: Payer: Self-pay | Admitting: Family Medicine

## 2015-04-11 ENCOUNTER — Telehealth: Payer: Self-pay | Admitting: *Deleted

## 2015-04-11 DIAGNOSIS — N946 Dysmenorrhea, unspecified: Secondary | ICD-10-CM

## 2015-04-11 MED ORDER — DICLOFENAC SODIUM 75 MG PO TBEC: 75.0000 mg | DELAYED_RELEASE_TABLET | Freq: Two times a day (BID) | ORAL | Status: DC

## 2015-04-11 MED ORDER — MEGESTROL ACETATE 40 MG PO TABS
40.0000 mg | ORAL_TABLET | Freq: Two times a day (BID) | ORAL | Status: DC
Start: 2015-04-11 — End: 2015-04-28

## 2015-04-11 NOTE — Telephone Encounter (Signed)
-----   Message from Francia Greaves sent at 04/11/2015  9:15 AM EDT ----- Regarding: Rx Refill Contact: [phone] Patient called and needs a refill on her diclofenac and megestrol uses [facility] in [place]

## 2015-04-11 NOTE — Telephone Encounter (Signed)
I have sent in a refill on Diclofenac and Megace.  Pt scheduled appt for 11/15 with Kennon Rounds.

## 2015-04-23 ENCOUNTER — Encounter: Payer: Self-pay | Admitting: Family Medicine

## 2015-04-23 ENCOUNTER — Ambulatory Visit (INDEPENDENT_AMBULATORY_CARE_PROVIDER_SITE_OTHER): Payer: 59 | Admitting: Family Medicine

## 2015-04-23 VITALS — BP 149/96 | HR 73 | Ht 69.0 in | Wt 304.0 lb

## 2015-04-23 DIAGNOSIS — N920 Excessive and frequent menstruation with regular cycle: Secondary | ICD-10-CM | POA: Diagnosis not present

## 2015-04-23 DIAGNOSIS — Z720 Tobacco use: Secondary | ICD-10-CM

## 2015-04-23 DIAGNOSIS — Z30431 Encounter for routine checking of intrauterine contraceptive device: Secondary | ICD-10-CM

## 2015-04-23 DIAGNOSIS — Z1151 Encounter for screening for human papillomavirus (HPV): Secondary | ICD-10-CM | POA: Diagnosis not present

## 2015-04-23 DIAGNOSIS — Z01419 Encounter for gynecological examination (general) (routine) without abnormal findings: Secondary | ICD-10-CM | POA: Diagnosis not present

## 2015-04-23 DIAGNOSIS — Z23 Encounter for immunization: Secondary | ICD-10-CM | POA: Diagnosis not present

## 2015-04-23 DIAGNOSIS — I10 Essential (primary) hypertension: Secondary | ICD-10-CM

## 2015-04-23 DIAGNOSIS — Z124 Encounter for screening for malignant neoplasm of cervix: Secondary | ICD-10-CM

## 2015-04-23 NOTE — Assessment & Plan Note (Signed)
Still with IUD and using megace with first 2 days of cycle to control duration and amount of bleeding.  This is working well for her and will continue.

## 2015-04-23 NOTE — Patient Instructions (Signed)
Smoking Cessation, Tips for Success If you are ready to quit smoking, congratulations! You have chosen to help yourself be healthier. Cigarettes bring nicotine, tar, carbon monoxide, and other irritants into your body. Your lungs, heart, and blood vessels will be able to work better without these poisons. There are many different ways to quit smoking. Nicotine gum, nicotine patches, a nicotine inhaler, or nicotine nasal spray can help with physical craving. Hypnosis, support groups, and medicines help break the habit of smoking. WHAT THINGS CAN I DO TO MAKE QUITTING EASIER?  Here are some tips to help you quit for good:  Pick a date when you will quit smoking completely. Tell all of your friends and family about your plan to quit on that date.  Do not try to slowly cut down on the number of cigarettes you are smoking. Pick a quit date and quit smoking completely starting on that day.  Throw away all cigarettes.   Clean and remove all ashtrays from your home, work, and car.  On a card, write down your reasons for quitting. Carry the card with you and read it when you get the urge to smoke.  Cleanse your body of nicotine. Drink enough water and fluids to keep your urine clear or pale yellow. Do this after quitting to flush the nicotine from your body.  Learn to predict your moods. Do not let a bad situation be your excuse to have a cigarette. Some situations in your life might tempt you into wanting a cigarette.  Never have "just one" cigarette. It leads to wanting another and another. Remind yourself of your decision to quit.  Change habits associated with smoking. If you smoked while driving or when feeling stressed, try other activities to replace smoking. Stand up when drinking your coffee. Brush your teeth after eating. Sit in a different chair when you read the paper. Avoid alcohol while trying to quit, and try to drink fewer caffeinated beverages. Alcohol and caffeine may urge you to  smoke.  Avoid foods and drinks that can trigger a desire to smoke, such as sugary or spicy foods and alcohol.  Ask people who smoke not to smoke around you.  Have something planned to do right after eating or having a cup of coffee. For example, plan to take a walk or exercise.  Try a relaxation exercise to calm you down and decrease your stress. Remember, you may be tense and nervous for the first 2 weeks after you quit, but this will pass.  Find new activities to keep your hands busy. Play with a pen, coin, or rubber band. Doodle or draw things on paper.  Brush your teeth right after eating. This will help cut down on the craving for the taste of tobacco after meals. You can also try mouthwash.   Use oral substitutes in place of cigarettes. Try using lemon drops, carrots, cinnamon sticks, or chewing gum. Keep them handy so they are available when you have the urge to smoke.  When you have the urge to smoke, try deep breathing.  Designate your home as a nonsmoking area.  If you are a heavy smoker, ask your health care provider about a prescription for nicotine chewing gum. It can ease your withdrawal from nicotine.  Reward yourself. Set aside the cigarette money you save and buy yourself something nice.  Look for support from others. Join a support group or smoking cessation program. Ask someone at home or at work to help you with your plan   to quit smoking.  Always ask yourself, "Do I need this cigarette or is this just a reflex?" Tell yourself, "Today, I choose not to smoke," or "I do not want to smoke." You are reminding yourself of your decision to quit.  Do not replace cigarette smoking with electronic cigarettes (commonly called e-cigarettes). The safety of e-cigarettes is unknown, and some may contain harmful chemicals.  If you relapse, do not give up! Plan ahead and think about what you will do the next time you get the urge to smoke. HOW WILL I FEEL WHEN I QUIT SMOKING? You  may have symptoms of withdrawal because your body is used to nicotine (the addictive substance in cigarettes). You may crave cigarettes, be irritable, feel very hungry, cough often, get headaches, or have difficulty concentrating. The withdrawal symptoms are only temporary. They are strongest when you first quit but will go away within 10-14 days. When withdrawal symptoms occur, stay in control. Think about your reasons for quitting. Remind yourself that these are signs that your body is healing and getting used to being without cigarettes. Remember that withdrawal symptoms are easier to treat than the major diseases that smoking can cause.  Even after the withdrawal is over, expect periodic urges to smoke. However, these cravings are generally short lived and will go away whether you smoke or not. Do not smoke! WHAT RESOURCES ARE AVAILABLE TO HELP ME QUIT SMOKING? Your health care provider can direct you to community resources or hospitals for support, which may include:  Group support.  Education.  Hypnosis.  Therapy.   This information is not intended to replace advice given to you by your health care provider. Make sure you discuss any questions you have with your health care provider.   Document Released: 02/21/2004 Document Revised: 06/15/2014 Document Reviewed: 11/10/2012 Elsevier Interactive Patient Education 2016 [employer] for Adults, Female A healthy lifestyle and preventive care can promote health and wellness. Preventive health guidelines for women include the following key practices.  A routine yearly physical is a good way to check with your health care provider about your health and preventive screening. It is a chance to share any concerns and updates on your health and to receive a thorough exam.  Visit your dentist for a routine exam and preventive care every 6 months. Brush your teeth twice a day and floss once a day. Good oral hygiene prevents tooth  decay and gum disease.  The frequency of eye exams is based on your age, health, family medical history, use of contact lenses, and other factors. Follow your health care provider's recommendations for frequency of eye exams.  Eat a healthy diet. Foods like vegetables, fruits, whole grains, low-fat dairy products, and lean protein foods contain the nutrients you need without too many calories. Decrease your intake of foods high in solid fats, added sugars, and salt. Eat the right amount of calories for you.Get information about a proper diet from your health care provider, if necessary.  Regular physical exercise is one of the most important things you can do for your health. Most adults should get at least 150 minutes of moderate-intensity exercise (any activity that increases your heart rate and causes you to sweat) each week. In addition, most adults need muscle-strengthening exercises on 2 or more days a week.  Maintain a healthy weight. The body mass index (BMI) is a screening tool to identify possible weight problems. It provides an estimate of body fat based on height and weight.  Your health care provider can find your BMI and can help you achieve or maintain a healthy weight.For adults 20 years and older:  A BMI below 18.5 is considered underweight.  A BMI of 18.5 to 24.9 is normal.  A BMI of 25 to 29.9 is considered overweight.  A BMI of 30 and above is considered obese.  Maintain normal blood lipids and cholesterol levels by exercising and minimizing your intake of saturated fat. Eat a balanced diet with plenty of fruit and vegetables. Blood tests for lipids and cholesterol should begin at age 34 and be repeated every 5 years. If your lipid or cholesterol levels are high, you are over 50, or you are at high risk for heart disease, you may need your cholesterol levels checked more frequently.Ongoing high lipid and cholesterol levels should be treated with medicines if diet and exercise  are not working.  If you smoke, find out from your health care provider how to quit. If you do not use tobacco, do not start.  Lung cancer screening is recommended for adults aged 54-80 years who are at high risk for developing lung cancer because of a history of smoking. A yearly low-dose CT scan of the lungs is recommended for people who have at least a 30-pack-year history of smoking and are a current smoker or have quit within the past 15 years. A pack year of smoking is smoking an average of 1 pack of cigarettes a day for 1 year (for example: 1 pack a day for 30 years or 2 packs a day for 15 years). Yearly screening should continue until the smoker has stopped smoking for at least 15 years. Yearly screening should be stopped for people who develop a health problem that would prevent them from having lung cancer treatment.  If you are pregnant, do not drink alcohol. If you are breastfeeding, be very cautious about drinking alcohol. If you are not pregnant and choose to drink alcohol, do not have more than 1 drink per day. One drink is considered to be 12 ounces (355 mL) of beer, 5 ounces (148 mL) of wine, or 1.5 ounces (44 mL) of liquor.  Avoid use of street drugs. Do not share needles with anyone. Ask for help if you need support or instructions about stopping the use of drugs.  High blood pressure causes heart disease and increases the risk of stroke. Your blood pressure should be checked at least every 1 to 2 years. Ongoing high blood pressure should be treated with medicines if weight loss and exercise do not work.  If you are 9-21 years old, ask your health care provider if you should take aspirin to prevent strokes.  Diabetes screening is done by taking a blood sample to check your blood glucose level after you have not eaten for a certain period of time (fasting). If you are not overweight and you do not have risk factors for diabetes, you should be screened once every 3 years starting at  age 84. If you are overweight or obese and you are 76-75 years of age, you should be screened for diabetes every year as part of your cardiovascular risk assessment.  Breast cancer screening is essential preventive care for women. You should practice "breast self-awareness." This means understanding the normal appearance and feel of your breasts and may include breast self-examination. Any changes detected, no matter how small, should be reported to a health care provider. Women in their 63s and 30s should have a clinical breast  exam (CBE) by a health care provider as part of a regular health exam every 1 to 3 years. After age 74, women should have a CBE every year. Starting at age 3, women should consider having a mammogram (breast X-ray test) every year. Women who have a family history of breast cancer should talk to their health care provider about genetic screening. Women at a high risk of breast cancer should talk to their health care providers about having an MRI and a mammogram every year.  Breast cancer gene (BRCA)-related cancer risk assessment is recommended for women who have family members with BRCA-related cancers. BRCA-related cancers include breast, ovarian, tubal, and peritoneal cancers. Having family members with these cancers may be associated with an increased risk for harmful changes (mutations) in the breast cancer genes BRCA1 and BRCA2. Results of the assessment will determine the need for genetic counseling and BRCA1 and BRCA2 testing.  Your health care provider may recommend that you be screened regularly for cancer of the pelvic organs (ovaries, uterus, and vagina). This screening involves a pelvic examination, including checking for microscopic changes to the surface of your cervix (Pap test). You may be encouraged to have this screening done every 3 years, beginning at age 21.  For women ages 3-65, health care providers may recommend pelvic exams and Pap testing every 3 years, or  they may recommend the Pap and pelvic exam, combined with testing for human papilloma virus (HPV), every 5 years. Some types of HPV increase your risk of cervical cancer. Testing for HPV may also be done on women of any age with unclear Pap test results.  Other health care providers may not recommend any screening for nonpregnant women who are considered low risk for pelvic cancer and who do not have symptoms. Ask your health care provider if a screening pelvic exam is right for you.  If you have had past treatment for cervical cancer or a condition that could lead to cancer, you need Pap tests and screening for cancer for at least 20 years after your treatment. If Pap tests have been discontinued, your risk factors (such as having a new sexual partner) need to be reassessed to determine if screening should resume. Some women have medical problems that increase the chance of getting cervical cancer. In these cases, your health care provider may recommend more frequent screening and Pap tests.  Colorectal cancer can be detected and often prevented. Most routine colorectal cancer screening begins at the age of 84 years and continues through age 67 years. However, your health care provider may recommend screening at an earlier age if you have risk factors for colon cancer. On a yearly basis, your health care provider may provide home test kits to check for hidden blood in the stool. Use of a small camera at the end of a tube, to directly examine the colon (sigmoidoscopy or colonoscopy), can detect the earliest forms of colorectal cancer. Talk to your health care provider about this at age 59, when routine screening begins. Direct exam of the colon should be repeated every 5-10 years through age 37 years, unless early forms of precancerous polyps or small growths are found.  People who are at an increased risk for hepatitis B should be screened for this virus. You are considered at high risk for hepatitis B  if:  You were born in a country where hepatitis B occurs often. Talk with your health care provider about which countries are considered high risk.  Your parents  were born in a high-risk country and you have not received a shot to protect against hepatitis B (hepatitis B vaccine).  You have HIV or AIDS.  You use needles to inject street drugs.  You live with, or have sex with, someone who has hepatitis B.  You get hemodialysis treatment.  You take certain medicines for conditions like cancer, organ transplantation, and autoimmune conditions.  Hepatitis C blood testing is recommended for all people born from [birth year] through 1963 and any individual with known risks for hepatitis C.  Practice safe sex. Use condoms and avoid high-risk sexual practices to reduce the spread of sexually transmitted infections (STIs). STIs include gonorrhea, chlamydia, syphilis, trichomonas, herpes, HPV, and human immunodeficiency virus (HIV). Herpes, HIV, and HPV are viral illnesses that have no cure. They can result in disability, cancer, and death.  You should be screened for sexually transmitted illnesses (STIs) including gonorrhea and chlamydia if:  You are sexually active and are younger than 24 years.  You are older than 24 years and your health care provider tells you that you are at risk for this type of infection.  Your sexual activity has changed since you were last screened and you are at an increased risk for chlamydia or gonorrhea. Ask your health care provider if you are at risk.  If you are at risk of being infected with HIV, it is recommended that you take a prescription medicine daily to prevent HIV infection. This is called preexposure prophylaxis (PrEP). You are considered at risk if:  You are sexually active and do not regularly use condoms or know the HIV status of your partner(s).  You take drugs by injection.  You are sexually active with a partner who has HIV.  Talk with your health  care provider about whether you are at high risk of being infected with HIV. If you choose to begin PrEP, you should first be tested for HIV. You should then be tested every 3 months for as long as you are taking PrEP.  Osteoporosis is a disease in which the bones lose minerals and strength with aging. This can result in serious bone fractures or breaks. The risk of osteoporosis can be identified using a bone density scan. Women ages 71 years and over and women at risk for fractures or osteoporosis should discuss screening with their health care providers. Ask your health care provider whether you should take a calcium supplement or vitamin D to reduce the rate of osteoporosis.  Menopause can be associated with physical symptoms and risks. Hormone replacement therapy is available to decrease symptoms and risks. You should talk to your health care provider about whether hormone replacement therapy is right for you.  Use sunscreen. Apply sunscreen liberally and repeatedly throughout the day. You should seek shade when your shadow is shorter than you. Protect yourself by wearing long sleeves, pants, a wide-brimmed hat, and sunglasses year round, whenever you are outdoors.  Once a month, do a whole body skin exam, using a mirror to look at the skin on your back. Tell your health care provider of new moles, moles that have irregular borders, moles that are larger than a pencil eraser, or moles that have changed in shape or color.  Stay current with required vaccines (immunizations).  Influenza vaccine. All adults should be immunized every year.  Tetanus, diphtheria, and acellular pertussis (Td, Tdap) vaccine. Pregnant women should receive 1 dose of Tdap vaccine during each pregnancy. The dose should be obtained regardless of the  length of time since the last dose. Immunization is preferred during the 27th-36th week of gestation. An adult who has not previously received Tdap or who does not know her vaccine  status should receive 1 dose of Tdap. This initial dose should be followed by tetanus and diphtheria toxoids (Td) booster doses every 10 years. Adults with an unknown or incomplete history of completing a 3-dose immunization series with Td-containing vaccines should begin or complete a primary immunization series including a Tdap dose. Adults should receive a Td booster every 10 years.  Varicella vaccine. An adult without evidence of immunity to varicella should receive 2 doses or a second dose if she has previously received 1 dose. Pregnant females who do not have evidence of immunity should receive the first dose after pregnancy. This first dose should be obtained before leaving the health care facility. The second dose should be obtained 4-8 weeks after the first dose.  Human papillomavirus (HPV) vaccine. Females aged 13-26 years who have not received the vaccine previously should obtain the 3-dose series. The vaccine is not recommended for use in pregnant females. However, pregnancy testing is not needed before receiving a dose. If a female is found to be pregnant after receiving a dose, no treatment is needed. In that case, the remaining doses should be delayed until after the pregnancy. Immunization is recommended for any person with an immunocompromised condition through the age of 31 years if she did not get any or all doses earlier. During the 3-dose series, the second dose should be obtained 4-8 weeks after the first dose. The third dose should be obtained 24 weeks after the first dose and 16 weeks after the second dose.  Zoster vaccine. One dose is recommended for adults aged 50 years or older unless certain conditions are present.  Measles, mumps, and rubella (MMR) vaccine. Adults born before [birth year] generally are considered immune to measles and mumps. Adults born in [birth year] or later should have 1 or more doses of MMR vaccine unless there is a contraindication to the vaccine or there is laboratory  evidence of immunity to each of the three diseases. A routine second dose of MMR vaccine should be obtained at least 28 days after the first dose for students attending postsecondary schools, health care workers, or international travelers. People who received inactivated measles vaccine or an unknown type of measles vaccine during 1963-1967 should receive 2 doses of MMR vaccine. People who received inactivated mumps vaccine or an unknown type of mumps vaccine before 1979 and are at high risk for mumps infection should consider immunization with 2 doses of MMR vaccine. For females of childbearing age, rubella immunity should be determined. If there is no evidence of immunity, females who are not pregnant should be vaccinated. If there is no evidence of immunity, females who are pregnant should delay immunization until after pregnancy. Unvaccinated health care workers born before [birth year] who lack laboratory evidence of measles, mumps, or rubella immunity or laboratory confirmation of disease should consider measles and mumps immunization with 2 doses of MMR vaccine or rubella immunization with 1 dose of MMR vaccine.  Pneumococcal 13-valent conjugate (PCV13) vaccine. When indicated, a person who is uncertain of his immunization history and has no record of immunization should receive the PCV13 vaccine. All adults 21 years of age and older should receive this vaccine. An adult aged 23 years or older who has certain medical conditions and has not been previously immunized should receive 1 dose of PCV13 vaccine. This PCV13  should be followed with a dose of pneumococcal polysaccharide (PPSV23) vaccine. Adults who are at high risk for pneumococcal disease should obtain the PPSV23 vaccine at least 8 weeks after the dose of PCV13 vaccine. Adults older than 37 years of age who have normal immune system function should obtain the PPSV23 vaccine dose at least 1 year after the dose of PCV13 vaccine.  Pneumococcal polysaccharide  (PPSV23) vaccine. When PCV13 is also indicated, PCV13 should be obtained first. All adults aged 40 years and older should be immunized. An adult younger than age 65 years who has certain medical conditions should be immunized. Any person who resides in a nursing home or long-term care facility should be immunized. An adult smoker should be immunized. People with an immunocompromised condition and certain other conditions should receive both PCV13 and PPSV23 vaccines. People with human immunodeficiency virus (HIV) infection should be immunized as soon as possible after diagnosis. Immunization during chemotherapy or radiation therapy should be avoided. Routine use of PPSV23 vaccine is not recommended for American Indians, [place] Natives, or people younger than 65 years unless there are medical conditions that require PPSV23 vaccine. When indicated, people who have unknown immunization and have no record of immunization should receive PPSV23 vaccine. One-time revaccination 5 years after the first dose of PPSV23 is recommended for people aged 19-64 years who have chronic kidney failure, nephrotic syndrome, asplenia, or immunocompromised conditions. People who received 1-2 doses of PPSV23 before age 24 years should receive another dose of PPSV23 vaccine at age 8 years or later if at least 5 years have passed since the previous dose. Doses of PPSV23 are not needed for people immunized with PPSV23 at or after age 43 years.  Meningococcal vaccine. Adults with asplenia or persistent complement component deficiencies should receive 2 doses of quadrivalent meningococcal conjugate (MenACWY-D) vaccine. The doses should be obtained at least 2 months apart. Microbiologists working with certain meningococcal bacteria, [employer] recruits, people at risk during an outbreak, and people who travel to or live in countries with a high rate of meningitis should be immunized. A first-year college student up through age 71 years who is  living in a residence hall should receive a dose if she did not receive a dose on or after her 16th birthday. Adults who have certain high-risk conditions should receive one or more doses of vaccine.  Hepatitis A vaccine. Adults who wish to be protected from this disease, have certain high-risk conditions, work with hepatitis A-infected animals, work in hepatitis A research labs, or travel to or work in countries with a high rate of hepatitis A should be immunized. Adults who were previously unvaccinated and who anticipate close contact with an international adoptee during the first 60 days after arrival in the [place] from a country with a high rate of hepatitis A should be immunized.  Hepatitis B vaccine. Adults who wish to be protected from this disease, have certain high-risk conditions, may be exposed to blood or other infectious body fluids, are household contacts or sex partners of hepatitis B positive people, are clients or workers in certain care facilities, or travel to or work in countries with a high rate of hepatitis B should be immunized.  Haemophilus influenzae type b (Hib) vaccine. A previously unvaccinated person with asplenia or sickle cell disease or having a scheduled splenectomy should receive 1 dose of Hib vaccine. Regardless of previous immunization, a recipient of a hematopoietic stem cell transplant should receive a 3-dose series 6-12 months after her successful  transplant. Hib vaccine is not recommended for adults with HIV infection. Preventive Services / Frequency Ages 55 to 40 years  Blood pressure check.** / Every 3-5 years.  Lipid and cholesterol check.** / Every 5 years beginning at age 69.  Clinical breast exam.** / Every 3 years for women in their 84s and 53s.  BRCA-related cancer risk assessment.** / For women who have family members with a BRCA-related cancer (breast, ovarian, tubal, or peritoneal cancers).  Pap test.** / Every 2 years from ages 67 through  27. Every 3 years starting at age 35 through age 81 or 40 with a history of 3 consecutive normal Pap tests.  HPV screening.** / Every 3 years from ages 7 through ages 72 to 71 with a history of 3 consecutive normal Pap tests.  Hepatitis C blood test.** / For any individual with known risks for hepatitis C.  Skin self-exam. / Monthly.  Influenza vaccine. / Every year.  Tetanus, diphtheria, and acellular pertussis (Tdap, Td) vaccine.** / Consult your health care provider. Pregnant women should receive 1 dose of Tdap vaccine during each pregnancy. 1 dose of Td every 10 years.  Varicella vaccine.** / Consult your health care provider. Pregnant females who do not have evidence of immunity should receive the first dose after pregnancy.  HPV vaccine. / 3 doses over 6 months, if 27 and younger. The vaccine is not recommended for use in pregnant females. However, pregnancy testing is not needed before receiving a dose.  Measles, mumps, rubella (MMR) vaccine.** / You need at least 1 dose of MMR if you were born in 1957 or later. You may also need a 2nd dose. For females of childbearing age, rubella immunity should be determined. If there is no evidence of immunity, females who are not pregnant should be vaccinated. If there is no evidence of immunity, females who are pregnant should delay immunization until after pregnancy.  Pneumococcal 13-valent conjugate (PCV13) vaccine.** / Consult your health care provider.  Pneumococcal polysaccharide (PPSV23) vaccine.** / 1 to 2 doses if you smoke cigarettes or if you have certain conditions.  Meningococcal vaccine.** / 1 dose if you are age 14 to 31 years and a Market researcher living in a residence hall, or have one of several medical conditions, you need to get vaccinated against meningococcal disease. You may also need additional booster doses.  Hepatitis A vaccine.** / Consult your health care provider.  Hepatitis B vaccine.** / Consult your  health care provider.  Haemophilus influenzae type b (Hib) vaccine.** / Consult your health care provider. Ages 56 to 41 years  Blood pressure check.** / Every year.  Lipid and cholesterol check.** / Every 5 years beginning at age 57 years.  Lung cancer screening. / Every year if you are aged 50-80 years and have a 30-pack-year history of smoking and currently smoke or have quit within the past 15 years. Yearly screening is stopped once you have quit smoking for at least 15 years or develop a health problem that would prevent you from having lung cancer treatment.  Clinical breast exam.** / Every year after age 69 years.  BRCA-related cancer risk assessment.** / For women who have family members with a BRCA-related cancer (breast, ovarian, tubal, or peritoneal cancers).  Mammogram.** / Every year beginning at age 61 years and continuing for as long as you are in good health. Consult with your health care provider.  Pap test.** / Every 3 years starting at age 34 years through age 36 or 61  years with a history of 3 consecutive normal Pap tests.  HPV screening.** / Every 3 years from ages 48 years through ages 16 to 70 years with a history of 3 consecutive normal Pap tests.  Fecal occult blood test (FOBT) of stool. / Every year beginning at age 5 years and continuing until age 63 years. You may not need to do this test if you get a colonoscopy every 10 years.  Flexible sigmoidoscopy or colonoscopy.** / Every 5 years for a flexible sigmoidoscopy or every 10 years for a colonoscopy beginning at age 8 years and continuing until age 31 years.  Hepatitis C blood test.** / For all people born from [birth year] through 1963 and any individual with known risks for hepatitis C.  Skin self-exam. / Monthly.  Influenza vaccine. / Every year.  Tetanus, diphtheria, and acellular pertussis (Tdap/Td) vaccine.** / Consult your health care provider. Pregnant women should receive 1 dose of Tdap vaccine during  each pregnancy. 1 dose of Td every 10 years.  Varicella vaccine.** / Consult your health care provider. Pregnant females who do not have evidence of immunity should receive the first dose after pregnancy.  Zoster vaccine.** / 1 dose for adults aged 10 years or older.  Measles, mumps, rubella (MMR) vaccine.** / You need at least 1 dose of MMR if you were born in 1957 or later. You may also need a second dose. For females of childbearing age, rubella immunity should be determined. If there is no evidence of immunity, females who are not pregnant should be vaccinated. If there is no evidence of immunity, females who are pregnant should delay immunization until after pregnancy.  Pneumococcal 13-valent conjugate (PCV13) vaccine.** / Consult your health care provider.  Pneumococcal polysaccharide (PPSV23) vaccine.** / 1 to 2 doses if you smoke cigarettes or if you have certain conditions.  Meningococcal vaccine.** / Consult your health care provider.  Hepatitis A vaccine.** / Consult your health care provider.  Hepatitis B vaccine.** / Consult your health care provider.  Haemophilus influenzae type b (Hib) vaccine.** / Consult your health care provider. Ages 52 years and over  Blood pressure check.** / Every year.  Lipid and cholesterol check.** / Every 5 years beginning at age 63 years.  Lung cancer screening. / Every year if you are aged 30-80 years and have a 30-pack-year history of smoking and currently smoke or have quit within the past 15 years. Yearly screening is stopped once you have quit smoking for at least 15 years or develop a health problem that would prevent you from having lung cancer treatment.  Clinical breast exam.** / Every year after age 50 years.  BRCA-related cancer risk assessment.** / For women who have family members with a BRCA-related cancer (breast, ovarian, tubal, or peritoneal cancers).  Mammogram.** / Every year beginning at age 13 years and continuing for as  long as you are in good health. Consult with your health care provider.  Pap test.** / Every 3 years starting at age 64 years through age 73 or 79 years with 3 consecutive normal Pap tests. Testing can be stopped between 65 and 70 years with 3 consecutive normal Pap tests and no abnormal Pap or HPV tests in the past 10 years.  HPV screening.** / Every 3 years from ages 17 years through ages 62 or 20 years with a history of 3 consecutive normal Pap tests. Testing can be stopped between 65 and 70 years with 3 consecutive normal Pap tests and no abnormal Pap or  HPV tests in the past 10 years.  Fecal occult blood test (FOBT) of stool. / Every year beginning at age 36 years and continuing until age 63 years. You may not need to do this test if you get a colonoscopy every 10 years.  Flexible sigmoidoscopy or colonoscopy.** / Every 5 years for a flexible sigmoidoscopy or every 10 years for a colonoscopy beginning at age 46 years and continuing until age 68 years.  Hepatitis C blood test.** / For all people born from [birth year] through 1963 and any individual with known risks for hepatitis C.  Osteoporosis screening.** / A one-time screening for women ages 40 years and over and women at risk for fractures or osteoporosis.  Skin self-exam. / Monthly.  Influenza vaccine. / Every year.  Tetanus, diphtheria, and acellular pertussis (Tdap/Td) vaccine.** / 1 dose of Td every 10 years.  Varicella vaccine.** / Consult your health care provider.  Zoster vaccine.** / 1 dose for adults aged 56 years or older.  Pneumococcal 13-valent conjugate (PCV13) vaccine.** / Consult your health care provider.  Pneumococcal polysaccharide (PPSV23) vaccine.** / 1 dose for all adults aged 66 years and older.  Meningococcal vaccine.** / Consult your health care provider.  Hepatitis A vaccine.** / Consult your health care provider.  Hepatitis B vaccine.** / Consult your health care provider.  Haemophilus influenzae type b  (Hib) vaccine.** / Consult your health care provider. ** Family history and personal history of risk and conditions may change your health care provider's recommendations.   This information is not intended to replace advice given to you by your health care provider. Make sure you discuss any questions you have with your health care provider.   Document Released: 07/21/2001 Document Revised: 06/15/2014 Document Reviewed: 10/20/2010 Elsevier Interactive Patient Education [employer].

## 2015-04-23 NOTE — Progress Notes (Signed)
Subjective:     Cassandra [name] is a 39 y.o. female and is here for a comprehensive physical exam. The patient reports no problems. Working on quitting smoking through work. She is using Nicorette lozenges.  No cigarettes x 8 days. Weight loss previously down to 288, now back up to 304 with smoking cessation.  Social History   Social History  . Marital Status: Married    Spouse Name: N/A  . Number of Children: N/A  . Years of Education: N/A   Occupational History  . Not on file.   Social History Main Topics  . Smoking status: Current Every Day Smoker    Types: Cigarettes  . Smokeless tobacco: Never Used     Comment: ~  2-3  cigarettes daily  . Alcohol Use: Yes     Comment: rare  . Drug Use: No  . Sexual Activity:    Partners: Male    Birth Control/ Protection: Surgical     Comment: tubalization   Other Topics Concern  . Not on file   Social History Narrative   Lives with husband and 2 children, no pets   Occupation: call center rep   Edu: some college   Activity: no regular exercise.  Tries to walk around building.   Health Maintenance  Topic Date Due  . PNEUMOCOCCAL POLYSACCHARIDE VACCINE (1) 07/15/1979  . FOOT EXAM  07/15/1987  . OPHTHALMOLOGY EXAM  07/15/1987  . URINE MICROALBUMIN  07/15/1987  . INFLUENZA VACCINE  01/07/2015  . HEMOGLOBIN A1C  05/31/2015  . PAP SMEAR  09/08/2015  . DTaP/Tdap/Td (2 - Td) 12/15/2022  . TETANUS/TDAP  12/15/2022  . HIV Screening  Completed    The following portions of the patient's history were reviewed and updated as appropriate: allergies, current medications, past family history, past medical history, past social history, past surgical history and problem list.  Review of Systems Pertinent items noted in HPI and remainder of comprehensive ROS otherwise negative.   Objective:    BP 149/96 mmHg  Pulse 73  Ht 5\' 9"  (1.753 m)  Wt 304 lb (137.893 kg)  BMI 44.87 kg/m2  LMP 04/12/2015 General appearance: alert,  cooperative, appears stated age and morbidly obese Head: Normocephalic, without obvious abnormality, atraumatic Neck: no adenopathy, supple, symmetrical, trachea midline and thyroid not enlarged, symmetric, no tenderness/mass/nodules Lungs: clear to auscultation bilaterally Breasts: normal appearance, no masses or tenderness Heart: regular rate and rhythm, S1, S2 normal, no murmur, click, rub or gallop Abdomen: soft, non-tender; bowel sounds normal; no masses,  no organomegaly Pelvic: cervix normal in appearance, external genitalia normal, no adnexal masses or tenderness, no cervical motion tenderness, uterus normal size, shape, and consistency, vagina normal without discharge and IUD strings seen Extremities: extremities normal, atraumatic, no cyanosis or edema Pulses: 2+ and symmetric Skin: Skin color, texture, turgor normal. No rashes or lesions Lymph nodes: Cervical, supraclavicular, and axillary nodes normal. Neurologic: Grossly normal    Assessment:    GYN female exam.      Plan:   Problem List Items Addressed This Visit      Unprioritized   Tobacco use    Working on cessation with nicotine replacement--encouraged to keep up the good work      Hypertension    BP up today--see PCP for further titration of meds if needed.      Heavy menstrual bleeding    Still with IUD and using megace with first 2 days of cycle to control duration and amount of bleeding.  This is working well for her and will continue.       Other Visit Diagnoses    Screening for malignant neoplasm of cervix    -  Primary    Encounter for routine gynecological examination        Relevant Orders    Cytology - PAP    Flu Vaccine QUAD 36+ mos IM (Fluarix, Quad PF) (Completed)         See After Visit Summary for Counseling Recommendations

## 2015-04-23 NOTE — Assessment & Plan Note (Signed)
Working on cessation with nicotine replacement--encouraged to keep up the good work

## 2015-04-23 NOTE — Assessment & Plan Note (Signed)
BP up today--see PCP for further titration of meds if needed.

## 2015-04-25 LAB — CYTOLOGY - PAP

## 2015-04-28 ENCOUNTER — Other Ambulatory Visit: Payer: Self-pay | Admitting: Family Medicine

## 2015-05-13 ENCOUNTER — Other Ambulatory Visit: Payer: Self-pay | Admitting: Family Medicine

## 2015-05-31 ENCOUNTER — Other Ambulatory Visit: Payer: Self-pay | Admitting: Family Medicine

## 2015-05-31 NOTE — Telephone Encounter (Signed)
Ok to refill? Patient was last seen 05/25/14.

## 2015-08-29 ENCOUNTER — Other Ambulatory Visit: Payer: Self-pay | Admitting: Family Medicine

## 2015-09-01 ENCOUNTER — Other Ambulatory Visit: Payer: Self-pay | Admitting: Family Medicine

## 2015-09-02 NOTE — Telephone Encounter (Signed)
Ok to refill 

## 2015-09-03 MED ORDER — CYCLOBENZAPRINE HCL 10 MG PO TABS: 10.0000 mg | ORAL_TABLET | Freq: Two times a day (BID) | ORAL | Status: DC | PRN

## 2015-09-24 ENCOUNTER — Other Ambulatory Visit: Payer: Self-pay | Admitting: Family Medicine

## 2015-09-24 NOTE — Telephone Encounter (Signed)
Ok to refill 

## 2015-09-28 ENCOUNTER — Other Ambulatory Visit: Payer: Self-pay | Admitting: Family Medicine

## 2015-10-30 ENCOUNTER — Encounter: Payer: Self-pay | Admitting: Family Medicine

## 2015-11-01 ENCOUNTER — Other Ambulatory Visit: Payer: Self-pay | Admitting: Family Medicine

## 2015-11-01 ENCOUNTER — Other Ambulatory Visit (INDEPENDENT_AMBULATORY_CARE_PROVIDER_SITE_OTHER): Payer: 59

## 2015-11-01 DIAGNOSIS — R739 Hyperglycemia, unspecified: Secondary | ICD-10-CM | POA: Diagnosis not present

## 2015-11-01 DIAGNOSIS — I10 Essential (primary) hypertension: Secondary | ICD-10-CM | POA: Diagnosis not present

## 2015-11-01 LAB — COMPREHENSIVE METABOLIC PANEL
ALT: 22 U/L (ref 0–35)
AST: 23 U/L (ref 0–37)
Albumin: 3.8 g/dL (ref 3.5–5.2)
Alkaline Phosphatase: 74 U/L (ref 39–117)
BUN: 9 mg/dL (ref 6–23)
CO2: 29 mEq/L (ref 19–32)
Calcium: 8.4 mg/dL (ref 8.4–10.5)
Chloride: 107 mEq/L (ref 96–112)
Creatinine, Ser: 0.77 mg/dL (ref 0.40–1.20)
GFR: 89.03 mL/min (ref >60.00)
Glucose, Bld: 104 mg/dL — ABNORMAL HIGH (ref 70–99)
Potassium: 3.9 mEq/L (ref 3.5–5.1)
Sodium: 141 mEq/L (ref 135–145)
Total Bilirubin: 0.3 mg/dL (ref 0.2–1.2)
Total Protein: 6.6 g/dL (ref 6.0–8.3)

## 2015-11-01 LAB — LIPID PANEL
Cholesterol: 182 mg/dL (ref 0–200)
HDL: 43.6 mg/dL (ref >39.00)
LDL Cholesterol: 117 mg/dL — ABNORMAL HIGH (ref 0–99)
NonHDL: 138.51
Total CHOL/HDL Ratio: 4
Triglycerides: 106 mg/dL (ref 0.0–149.0)
VLDL: 21.2 mg/dL (ref 0.0–40.0)

## 2015-11-01 LAB — HEMOGLOBIN A1C: Hgb A1c MFr Bld: 5.7 % (ref 4.6–6.5)

## 2015-11-08 ENCOUNTER — Ambulatory Visit (INDEPENDENT_AMBULATORY_CARE_PROVIDER_SITE_OTHER): Payer: 59 | Admitting: Family Medicine

## 2015-11-08 ENCOUNTER — Encounter: Payer: Self-pay | Admitting: Family Medicine

## 2015-11-08 VITALS — BP 144/100 | HR 64 | Temp 98.3°F | Ht 68.0 in | Wt 318.5 lb

## 2015-11-08 DIAGNOSIS — I10 Essential (primary) hypertension: Secondary | ICD-10-CM

## 2015-11-08 DIAGNOSIS — F39 Unspecified mood [affective] disorder: Secondary | ICD-10-CM | POA: Diagnosis not present

## 2015-11-08 DIAGNOSIS — Z87891 Personal history of nicotine dependence: Secondary | ICD-10-CM | POA: Diagnosis not present

## 2015-11-08 DIAGNOSIS — Z21 Asymptomatic human immunodeficiency virus [HIV] infection status: Secondary | ICD-10-CM

## 2015-11-08 DIAGNOSIS — B20 Human immunodeficiency virus [HIV] disease: Secondary | ICD-10-CM

## 2015-11-08 DIAGNOSIS — Z0000 Encounter for general adult medical examination without abnormal findings: Secondary | ICD-10-CM | POA: Diagnosis not present

## 2015-11-08 DIAGNOSIS — Z6841 Body Mass Index (BMI) 40.0 and over, adult: Secondary | ICD-10-CM

## 2015-11-08 MED ORDER — HYDROCHLOROTHIAZIDE 25 MG PO TABS: 25.0000 mg | ORAL_TABLET | Freq: Every day | ORAL | Status: DC

## 2015-11-08 MED ORDER — AMLODIPINE BESYLATE 5 MG PO TABS: 5.0000 mg | ORAL_TABLET | Freq: Every day | ORAL | Status: DC

## 2015-11-08 NOTE — Assessment & Plan Note (Signed)
Congratulated on quitting smoking.   

## 2015-11-08 NOTE — Assessment & Plan Note (Addendum)
Anxiety + depression - stable on zoloft 50mg  daily by Dr Ola Spurr and doing well.

## 2015-11-08 NOTE — Progress Notes (Signed)
Pre visit review using our clinic review tool, if applicable. No additional management support is needed unless otherwise documented below in the visit note. 

## 2015-11-08 NOTE — Patient Instructions (Addendum)
Blood pressure staying elevated. Look at handout below. Continue amlodipine 5mg  daily. Add hydrochlorothiazide 25mg  daily. Make sure to eat potassium rich foods (fruits/vegetables).  Return 1 month f/u HTN.   DASH Eating Plan DASH stands for "Dietary Approaches to Stop Hypertension." The DASH eating plan is a healthy eating plan that has been shown to reduce high blood pressure (hypertension). Additional health benefits may include reducing the risk of type 2 diabetes mellitus, heart disease, and stroke. The DASH eating plan may also help with weight loss. WHAT DO I NEED TO KNOW ABOUT THE DASH EATING PLAN? For the DASH eating plan, you will follow these general guidelines:  Choose foods with a percent daily value for sodium of less than 5% (as listed on the food label).  Use salt-free seasonings or herbs instead of table salt or sea salt.  Check with your health care provider or pharmacist before using salt substitutes.  Eat lower-sodium products, often labeled as "lower sodium" or "no salt added."  Eat fresh foods.  Eat more vegetables, fruits, and low-fat dairy products.  Choose whole grains. Look for the word "whole" as the first word in the ingredient list.  Choose fish and skinless chicken or [place] more often than red meat. Limit fish, poultry, and meat to 6 oz (170 g) each day.  Limit sweets, desserts, sugars, and sugary drinks.  Choose heart-healthy fats.  Limit cheese to 1 oz (28 g) per day.  Eat more home-cooked food and less restaurant, buffet, and fast food.  Limit fried foods.  Cook foods using methods other than frying.  Limit canned vegetables. If you do use them, rinse them well to decrease the sodium.  When eating at a restaurant, ask that your food be prepared with less salt, or no salt if possible. WHAT FOODS CAN I EAT? Seek help from a dietitian for individual calorie needs. Grains Whole grain or whole wheat bread. Brown rice. Whole grain or whole wheat  pasta. Quinoa, bulgur, and whole grain cereals. Low-sodium cereals. Corn or whole wheat flour tortillas. Whole grain cornbread. Whole grain crackers. Low-sodium crackers. Vegetables Fresh or frozen vegetables (raw, steamed, roasted, or grilled). Low-sodium or reduced-sodium tomato and vegetable juices. Low-sodium or reduced-sodium tomato sauce and paste. Low-sodium or reduced-sodium canned vegetables.  Fruits All fresh, canned (in natural juice), or frozen fruits. Meat and Other Protein Products Ground beef (85% or leaner), grass-fed beef, or beef trimmed of fat. Skinless chicken or [place]. Ground chicken or [place]. Pork trimmed of fat. All fish and seafood. Eggs. Dried beans, peas, or lentils. Unsalted nuts and seeds. Unsalted canned beans. Dairy Low-fat dairy products, such as skim or 1% milk, 2% or reduced-fat cheeses, low-fat ricotta or cottage cheese, or plain low-fat yogurt. Low-sodium or reduced-sodium cheeses. Fats and Oils Tub margarines without trans fats. Light or reduced-fat mayonnaise and salad dressings (reduced sodium). Avocado. Safflower, olive, or canola oils. Natural peanut or almond butter. Other Unsalted popcorn and pretzels. The items listed above may not be a complete list of recommended foods or beverages. Contact your dietitian for more options. WHAT FOODS ARE NOT RECOMMENDED? Grains White bread. White pasta. White rice. Refined cornbread. Bagels and croissants. Crackers that contain trans fat. Vegetables Creamed or fried vegetables. Vegetables in a cheese sauce. Regular canned vegetables. Regular canned tomato sauce and paste. Regular tomato and vegetable juices. Fruits Dried fruits. Canned fruit in light or heavy syrup. Fruit juice. Meat and Other Protein Products Fatty cuts of meat. Ribs, chicken wings, bacon, sausage, bologna, salami,  chitterlings, fatback, hot dogs, bratwurst, and packaged luncheon meats. Salted nuts and seeds. Canned beans with salt. Dairy Whole  or 2% milk, cream, half-and-half, and cream cheese. Whole-fat or sweetened yogurt. Full-fat cheeses or blue cheese. Nondairy creamers and whipped toppings. Processed cheese, cheese spreads, or cheese curds. Condiments Onion and garlic salt, seasoned salt, table salt, and sea salt. Canned and packaged gravies. Worcestershire sauce. Tartar sauce. Barbecue sauce. Teriyaki sauce. Soy sauce, including reduced sodium. Steak sauce. Fish sauce. Oyster sauce. Cocktail sauce. Horseradish. Ketchup and mustard. Meat flavorings and tenderizers. Bouillon cubes. Hot sauce. Tabasco sauce. Marinades. Taco seasonings. Relishes. Fats and Oils Butter, stick margarine, lard, shortening, ghee, and bacon fat. Coconut, palm kernel, or palm oils. Regular salad dressings. Other Pickles and olives. Salted popcorn and pretzels. The items listed above may not be a complete list of foods and beverages to avoid. Contact your dietitian for more information. WHERE CAN I FIND MORE INFORMATION? National Heart, Lung, and Blood Institute: travelstabloid.com   This information is not intended to replace advice given to you by your health care provider. Make sure you discuss any questions you have with your health care provider.   Document Released: 05/14/2011 Document Revised: 06/15/2014 Document Reviewed: 03/29/2013 Elsevier Interactive Patient Education [employer].

## 2015-11-08 NOTE — Progress Notes (Signed)
BP 144/100 mmHg  Pulse 64  Temp(Src) 98.3 F (36.8 C) (Oral)  Ht 5\' 8"  (1.727 m)  Wt 318 lb 8 oz (144.471 kg)  BMI 48.44 kg/m2  LMP 10/28/2015   CC: CPE  Subjective:    Patient ID: Cassandra [name], female    DOB: [birth year], 40 y.o.   MRN: [id number]  HPI: Cassandra [name] is a 40 y.o. female presenting on 11/08/2015 for Annual Exam   Bought house in May. Excited to get back in pool.   Quit smoking 04/2015!  HTN - bp at home 125-130/80s. Complaint with amlodipine 5mg  nightly. No vision changes, CP/tightness, SOB, leg swelling. + headaches.   Depression - zoloft working well. Prescribed by Dr Ola Spurr.  IUD + megace first 2 days of cycle for heavy bleeding per GYN.   Preventative: Well woman with Dr. Kennon Rounds - 04/2015. Normal pap smear, rpt 3 yrs Flu yearly  Tdap 12/2012 Seat belt use discussed  Sunscreen use discussed, no changing moles on skin.   Lives with husband and 2 children, no pets Occupation: call center rep Edu: some college Activity: no regular exercise. Tries to walk around building.  Diet: 1 mt dew in am, rest water, fruits/vegetables daily, bringing food from home   Relevant past medical, surgical, family and social history reviewed and updated as indicated. Interim medical history since our last visit reviewed. Allergies and medications reviewed and updated. Current Outpatient Prescriptions on File Prior to Visit  Medication Sig  . cyclobenzaprine (FLEXERIL) 10 MG tablet TAKE 1 TABLET (10 MG TOTAL) BY MOUTH 2 (TWO) TIMES DAILY AS NEEDED FOR MUSCLE SPASMS.  Marland Kitchen diclofenac (VOLTAREN) 75 MG EC tablet TAKE 1 TABLET (75 MG TOTAL) BY MOUTH 2 (TWO) TIMES DAILY WITH A MEAL.  Marland Kitchen efavirenz-emtricitabine-tenofovir (ATRIPLA) 600-200-300 MG per tablet Take 1 tablet by mouth at bedtime.  Marland Kitchen Fexofenadine HCl (ALLEGRA PO) Take by mouth daily as needed.   . Loratadine (CLARITIN PO) Take by mouth daily as needed.   . megestrol (MEGACE) 40 MG tablet TAKE 1 TABLET BY MOUTH  TWICE A DAY. CAN TAKE 2 TABLETS TWICE A DAY FOR HEAVY BLEEDING  . Multiple Vitamins-Minerals (MULTIVITAMIN PO) Take 1 tablet by mouth daily.  Marland Kitchen omeprazole (PRILOSEC OTC) 20 MG tablet Take 20 mg by mouth daily as needed.    No current facility-administered medications on file prior to visit.    Review of Systems  Constitutional: Negative for fever, chills, activity change, appetite change, fatigue and unexpected weight change.  HENT: Negative for hearing loss.   Eyes: Positive for visual disturbance (using progressive lenses).  Respiratory: Negative for cough, chest tightness, shortness of breath and wheezing.   Cardiovascular: Negative for chest pain, palpitations and leg swelling.  Gastrointestinal: Positive for diarrhea. Negative for nausea, vomiting, abdominal pain, constipation, blood in stool and abdominal distention.  Genitourinary: Negative for hematuria and difficulty urinating.  Musculoskeletal: Negative for myalgias, arthralgias and neck pain.  Skin: Negative for rash.  Neurological: Positive for headaches. Negative for dizziness, seizures and syncope.  Hematological: Negative for adenopathy. Does not bruise/bleed easily.  Psychiatric/Behavioral: Negative for dysphoric mood. The patient is not nervous/anxious.    Per HPI unless specifically indicated in ROS section     Objective:    BP 144/100 mmHg  Pulse 64  Temp(Src) 98.3 F (36.8 C) (Oral)  Ht 5\' 8"  (1.727 m)  Wt 318 lb 8 oz (144.471 kg)  BMI 48.44 kg/m2  LMP 10/28/2015  Wt Readings from Last 3 Encounters:  11/08/15 318 lb 8 oz (144.471 kg)  04/23/15 304 lb (137.893 kg)  05/25/14 297 lb (134.718 kg)    Physical Exam  Constitutional: She is oriented to person, place, and time. She appears well-developed and well-nourished. No distress.  HENT:  Head: Normocephalic and atraumatic.  Right Ear: Hearing, tympanic membrane, external ear and ear canal normal.  Left Ear: Hearing, tympanic membrane, external ear and ear  canal normal.  Nose: Nose normal.  Mouth/Throat: Uvula is midline, oropharynx is clear and moist and mucous membranes are normal. No oropharyngeal exudate, posterior oropharyngeal edema or posterior oropharyngeal erythema.  Eyes: Conjunctivae and EOM are normal. Pupils are equal, round, and reactive to light. No scleral icterus.  Neck: Normal range of motion. Neck supple. No thyromegaly present.  Cardiovascular: Normal rate, regular rhythm, normal heart sounds and intact distal pulses.   No murmur heard. Pulses:      Radial pulses are 2+ on the right side, and 2+ on the left side.  Pulmonary/Chest: Effort normal and breath sounds normal. No respiratory distress. She has no wheezes. She has no rales.  Abdominal: Soft. Bowel sounds are normal. She exhibits no distension and no mass. There is no tenderness. There is no rebound and no guarding.  Musculoskeletal: Normal range of motion. She exhibits no edema.  Lymphadenopathy:    She has no cervical adenopathy.  Neurological: She is alert and oriented to person, place, and time.  CN grossly intact, station and gait intact  Skin: Skin is warm and dry. No rash noted.  Psychiatric: She has a normal mood and affect. Her behavior is normal. Judgment and thought content normal.  Nursing note and vitals reviewed.  Results for orders placed or performed in visit on 11/01/15  Comprehensive metabolic panel  Result Value Ref Range   Sodium 141 135 - 145 mEq/L   Potassium 3.9 3.5 - 5.1 mEq/L   Chloride 107 96 - 112 mEq/L   CO2 29 19 - 32 mEq/L   Glucose, Bld 104 (H) 70 - 99 mg/dL   BUN 9 6 - 23 mg/dL   Creatinine, Ser 0.77 0.40 - 1.20 mg/dL   Total Bilirubin 0.3 0.2 - 1.2 mg/dL   Alkaline Phosphatase 74 39 - 117 U/L   AST 23 0 - 37 U/L   ALT 22 0 - 35 U/L   Total Protein 6.6 6.0 - 8.3 g/dL   Albumin 3.8 3.5 - 5.2 g/dL   Calcium 8.4 8.4 - 10.5 mg/dL   GFR 89.03 >60.00 mL/min  Lipid panel  Result Value Ref Range   Cholesterol 182 0 - 200 mg/dL     Triglycerides 106.0 0.0 - 149.0 mg/dL   HDL 43.60 >39.00 mg/dL   VLDL 21.2 0.0 - 40.0 mg/dL   LDL Cholesterol 117 (H) 0 - 99 mg/dL   Total CHOL/HDL Ratio 4    NonHDL 138.51   Hemoglobin A1c  Result Value Ref Range   Hgb A1c MFr Bld 5.7 4.6 - 6.5 %      Assessment & Plan:   Problem List Items Addressed This Visit    Mood disorder ([place])    Anxiety + depression - stable on zoloft 50mg  daily by Dr Ola Spurr and doing well.       Morbid obesity with BMI of 45.0-49.9, adult ([place])    Tearful with discussion of weight gain.  Discussed healthy diet and lifestyle changes to affect sustainable weight loss.  Pt motivated to increase activity for goal weight loss.  Ex-smoker    Congratulated on quitting smoking.      HIV (human immunodeficiency virus infection) ([place])    Appreciate ID care. Continue atripla. Very stable readings recenlty.      Hypertension    Chronic ,deteriorated anticipate due to weight gain noted. DASH diet handout provided Continue amlodipine 5mg , add hctz 25mg  daily. Return 1 mo f/u visit and recheck labs Cr/K.       Relevant Medications   hydrochlorothiazide (HYDRODIURIL) 25 MG tablet   amLODipine (NORVASC) 5 MG tablet   Health maintenance examination - Primary    Preventative protocols reviewed and updated unless pt declined. Discussed healthy diet and lifestyle.           Follow up plan: Return in about 1 month (around 12/08/2015), or as needed, for follow up visit.  Ria Bush, MD

## 2015-11-08 NOTE — Assessment & Plan Note (Addendum)
Tearful with discussion of weight gain.  Discussed healthy diet and lifestyle changes to affect sustainable weight loss.  Pt motivated to increase activity for goal weight loss.

## 2015-11-08 NOTE — Assessment & Plan Note (Signed)
Chronic ,deteriorated anticipate due to weight gain noted. DASH diet handout provided Continue amlodipine 5mg , add hctz 25mg  daily. Return 1 mo f/u visit and recheck labs Cr/K.

## 2015-11-08 NOTE — Assessment & Plan Note (Signed)
Preventative protocols reviewed and updated unless pt declined. Discussed healthy diet and lifestyle.  

## 2015-11-08 NOTE — Assessment & Plan Note (Signed)
Appreciate ID care. Continue atripla. Very stable readings recenlty.

## 2015-12-20 ENCOUNTER — Encounter: Payer: Self-pay | Admitting: Family Medicine

## 2015-12-20 ENCOUNTER — Ambulatory Visit (INDEPENDENT_AMBULATORY_CARE_PROVIDER_SITE_OTHER): Payer: 59 | Admitting: Family Medicine

## 2015-12-20 VITALS — BP 110/80 | HR 83 | Ht 68.0 in | Wt 314.8 lb

## 2015-12-20 DIAGNOSIS — I10 Essential (primary) hypertension: Secondary | ICD-10-CM | POA: Diagnosis not present

## 2015-12-20 DIAGNOSIS — M79604 Pain in right leg: Secondary | ICD-10-CM | POA: Insufficient documentation

## 2015-12-20 DIAGNOSIS — M545 Low back pain: Secondary | ICD-10-CM | POA: Insufficient documentation

## 2015-12-20 DIAGNOSIS — M5440 Lumbago with sciatica, unspecified side: Secondary | ICD-10-CM | POA: Diagnosis not present

## 2015-12-20 LAB — BASIC METABOLIC PANEL
BUN: 10 mg/dL (ref 6–23)
CO2: 31 mEq/L (ref 19–32)
Calcium: 8.8 mg/dL (ref 8.4–10.5)
Chloride: 102 mEq/L (ref 96–112)
Creatinine, Ser: 0.81 mg/dL (ref 0.40–1.20)
GFR: 83.91 mL/min (ref >60.00)
Glucose, Bld: 124 mg/dL — ABNORMAL HIGH (ref 70–99)
Potassium: 3.5 mEq/L (ref 3.5–5.1)
Sodium: 141 mEq/L (ref 135–145)

## 2015-12-20 MED ORDER — HYDROCHLOROTHIAZIDE 25 MG PO TABS: 25.0000 mg | ORAL_TABLET | Freq: Every day | ORAL | Status: DC

## 2015-12-20 NOTE — Progress Notes (Signed)
BP 110/80 mmHg  Pulse 83  Ht 5\' 8"  (1.727 m)  Wt 314 lb 12.8 oz (142.792 kg)  BMI 47.88 kg/m2  SpO2 93%  LMP 12/20/2015   CC: 1 mo f/u HTN  Subjective:    Patient ID: Cassandra [name], female    DOB: February 19, 1976, 40 y.o.   MRN: [id number]  HPI: Cassandra TIEGS is a 40 y.o. female presenting on 12/20/2015 for Follow-up   See prior note for details. Briefly, seen here last month with elevated BP readings despite normal readings at home. Last visit we added hctz 25mg  to her daily amlodipine 5mg  regimen. bp much better controlled today. No HA, vision changes, CP/tightness, SOB, leg swelling.   Endorses several day history of lower back pain with some radiation down legs. She has been using flexeril which helps relax. Has not been using heating pad or NSAID. No fever.   Relevant past medical, surgical, family and social history reviewed and updated as indicated. Interim medical history since our last visit reviewed. Allergies and medications reviewed and updated. Current Outpatient Prescriptions on File Prior to Visit  Medication Sig  . amLODipine (NORVASC) 5 MG tablet Take 1 tablet (5 mg total) by mouth daily.  . cyclobenzaprine (FLEXERIL) 10 MG tablet TAKE 1 TABLET (10 MG TOTAL) BY MOUTH 2 (TWO) TIMES DAILY AS NEEDED FOR MUSCLE SPASMS.  Marland Kitchen diclofenac (VOLTAREN) 75 MG EC tablet TAKE 1 TABLET (75 MG TOTAL) BY MOUTH 2 (TWO) TIMES DAILY WITH A MEAL.  Marland Kitchen efavirenz-emtricitabine-tenofovir (ATRIPLA) 600-200-300 MG per tablet Take 1 tablet by mouth at bedtime.  Marland Kitchen Fexofenadine HCl (ALLEGRA PO) Take by mouth daily as needed.   . Loratadine (CLARITIN PO) Take by mouth daily as needed.   . megestrol (MEGACE) 40 MG tablet TAKE 1 TABLET BY MOUTH TWICE A DAY. CAN TAKE 2 TABLETS TWICE A DAY FOR HEAVY BLEEDING  . Multiple Vitamins-Minerals (MULTIVITAMIN PO) Take 1 tablet by mouth daily.  Marland Kitchen omeprazole (PRILOSEC OTC) 20 MG tablet Take 20 mg by mouth daily as needed.   . sertraline (ZOLOFT) 50 MG tablet  Take 50 mg by mouth daily.   No current facility-administered medications on file prior to visit.    Review of Systems Per HPI unless specifically indicated in ROS section     Objective:    BP 110/80 mmHg  Pulse 83  Ht 5\' 8"  (1.727 m)  Wt 314 lb 12.8 oz (142.792 kg)  BMI 47.88 kg/m2  SpO2 93%  LMP 12/20/2015  Wt Readings from Last 3 Encounters:  12/20/15 314 lb 12.8 oz (142.792 kg)  11/08/15 318 lb 8 oz (144.471 kg)  04/23/15 304 lb (137.893 kg)    Physical Exam  Constitutional: She is oriented to person, place, and time. She appears well-developed and well-nourished. No distress.  HENT:  Mouth/Throat: Oropharynx is clear and moist. No oropharyngeal exudate.  Cardiovascular: Normal rate, regular rhythm, normal heart sounds and intact distal pulses.   No murmur heard. Pulmonary/Chest: Effort normal and breath sounds normal. No respiratory distress. She has no wheezes. She has no rales.  Musculoskeletal: She exhibits no edema.  Tender to palpation midline lumbar spine No significant paraspinous mm tenderness Neg seated SLR bilaterally.  Neurological: She is alert and oriented to person, place, and time.  Skin: Skin is warm and dry. No rash noted.  Psychiatric: She has a normal mood and affect.  Nursing note and vitals reviewed.  Results for orders placed or performed in visit on 11/01/15  Comprehensive metabolic  panel  Result Value Ref Range   Sodium 141 135 - 145 mEq/L   Potassium 3.9 3.5 - 5.1 mEq/L   Chloride 107 96 - 112 mEq/L   CO2 29 19 - 32 mEq/L   Glucose, Bld 104 (H) 70 - 99 mg/dL   BUN 9 6 - 23 mg/dL   Creatinine, Ser 0.77 0.40 - 1.20 mg/dL   Total Bilirubin 0.3 0.2 - 1.2 mg/dL   Alkaline Phosphatase 74 39 - 117 U/L   AST 23 0 - 37 U/L   ALT 22 0 - 35 U/L   Total Protein 6.6 6.0 - 8.3 g/dL   Albumin 3.8 3.5 - 5.2 g/dL   Calcium 8.4 8.4 - 10.5 mg/dL   GFR 89.03 >60.00 mL/min  Lipid panel  Result Value Ref Range   Cholesterol 182 0 - 200 mg/dL    Triglycerides 106.0 0.0 - 149.0 mg/dL   HDL 43.60 >39.00 mg/dL   VLDL 21.2 0.0 - 40.0 mg/dL   LDL Cholesterol 117 (H) 0 - 99 mg/dL   Total CHOL/HDL Ratio 4    NonHDL 138.51   Hemoglobin A1c  Result Value Ref Range   Hgb A1c MFr Bld 5.7 4.6 - 6.5 %      Assessment & Plan:   Problem List Items Addressed This Visit    Hypertension - Primary    Chronic, stable. Continue current regimen.  Check BMP today.       Relevant Medications   hydrochlorothiazide (HYDRODIURIL) 25 MG tablet   Other Relevant Orders   Basic metabolic panel   Low back pain    Anticipate benign lumbar strain. Discussed supportive care, pt has flexeril and voltaren tablets at home. Suggested add heating pad and provided with lower back exercises.  Update if not improving with treatment.          Follow up plan: Return if symptoms worsen or fail to improve.  Ria Bush, MD

## 2015-12-20 NOTE — Progress Notes (Signed)
Pre visit review using our clinic review tool, if applicable. No additional management support is needed unless otherwise documented below in the visit note. 

## 2015-12-20 NOTE — Assessment & Plan Note (Signed)
Anticipate benign lumbar strain. Discussed supportive care, pt has flexeril and voltaren tablets at home. Suggested add heating pad and provided with lower back exercises.  Update if not improving with treatment.

## 2015-12-20 NOTE — Assessment & Plan Note (Addendum)
Chronic, stable. Continue current regimen.  Check BMP today.

## 2015-12-20 NOTE — Patient Instructions (Signed)
Blood pressure is looking better. continue current regimen. Labs today.  For lower back - treat with flexeril, voltaren tablets, heat to back. Try exercises today.  Should improve with time. If not better over next few weeks, let us know.

## 2016-01-16 ENCOUNTER — Other Ambulatory Visit: Payer: Self-pay | Admitting: *Deleted

## 2016-01-16 MED ORDER — HYDROCHLOROTHIAZIDE 25 MG PO TABS: 25.0000 mg | ORAL_TABLET | Freq: Every day | ORAL | 3 refills | Status: DC

## 2016-03-02 ENCOUNTER — Other Ambulatory Visit: Payer: Self-pay | Admitting: Family Medicine

## 2016-03-29 ENCOUNTER — Other Ambulatory Visit: Payer: Self-pay | Admitting: Family Medicine

## 2016-04-23 ENCOUNTER — Ambulatory Visit: Payer: Self-pay | Admitting: Family Medicine

## 2016-04-25 ENCOUNTER — Other Ambulatory Visit: Payer: Self-pay | Admitting: Family Medicine

## 2016-06-08 DIAGNOSIS — K8020 Calculus of gallbladder without cholecystitis without obstruction: Secondary | ICD-10-CM

## 2016-06-08 HISTORY — DX: Calculus of gallbladder without cholecystitis without obstruction: K80.20

## 2016-06-08 HISTORY — PX: CHOLECYSTECTOMY: SHX55

## 2016-06-09 ENCOUNTER — Ambulatory Visit (INDEPENDENT_AMBULATORY_CARE_PROVIDER_SITE_OTHER): Payer: 59 | Admitting: Family Medicine

## 2016-06-09 ENCOUNTER — Encounter: Payer: Self-pay | Admitting: Family Medicine

## 2016-06-09 VITALS — BP 126/84 | HR 93 | Ht 69.0 in | Wt 315.0 lb

## 2016-06-09 DIAGNOSIS — Z0000 Encounter for general adult medical examination without abnormal findings: Secondary | ICD-10-CM | POA: Diagnosis not present

## 2016-06-09 DIAGNOSIS — Z124 Encounter for screening for malignant neoplasm of cervix: Secondary | ICD-10-CM | POA: Diagnosis not present

## 2016-06-09 DIAGNOSIS — N920 Excessive and frequent menstruation with regular cycle: Secondary | ICD-10-CM | POA: Diagnosis not present

## 2016-06-09 DIAGNOSIS — Z23 Encounter for immunization: Secondary | ICD-10-CM

## 2016-06-09 DIAGNOSIS — Z1151 Encounter for screening for human papillomavirus (HPV): Secondary | ICD-10-CM

## 2016-06-09 DIAGNOSIS — Z01419 Encounter for gynecological examination (general) (routine) without abnormal findings: Secondary | ICD-10-CM | POA: Diagnosis not present

## 2016-06-09 DIAGNOSIS — N946 Dysmenorrhea, unspecified: Secondary | ICD-10-CM | POA: Diagnosis not present

## 2016-06-09 MED ORDER — MEGESTROL ACETATE 40 MG PO TABS: 40.0000 mg | ORAL_TABLET | Freq: Two times a day (BID) | ORAL | 2 refills | Status: DC | PRN

## 2016-06-09 MED ORDER — DICLOFENAC SODIUM 75 MG PO TBEC: 75.0000 mg | DELAYED_RELEASE_TABLET | Freq: Two times a day (BID) | ORAL | 2 refills | Status: DC | PRN

## 2016-06-09 NOTE — Progress Notes (Signed)
Pt c/o urinary incontinence when coughing.  Normal pap 04/2015

## 2016-06-09 NOTE — Patient Instructions (Signed)
Preventive Care 18-39 Years, Female Preventive care refers to lifestyle choices and visits with your health care provider that can promote health and wellness. What does preventive care include?  A yearly physical exam. This is also called an annual well check.  Dental exams once or twice a year.  Routine eye exams. Ask your health care provider how often you should have your eyes checked.  Personal lifestyle choices, including:  Daily care of your teeth and gums.  Regular physical activity.  Eating a healthy diet.  Avoiding tobacco and drug use.  Limiting alcohol use.  Practicing safe sex.  Taking vitamin and mineral supplements as recommended by your health care provider. What happens during an annual well check? The services and screenings done by your health care provider during your annual well check will depend on your age, overall health, lifestyle risk factors, and family history of disease. Counseling  Your health care provider may ask you questions about your:  Alcohol use.  Tobacco use.  Drug use.  Emotional well-being.  Home and relationship well-being.  Sexual activity.  Eating habits.  Work and work Statistician.  Method of birth control.  Menstrual cycle.  Pregnancy history. Screening  You may have the following tests or measurements:  Height, weight, and BMI.  Diabetes screening. This is done by checking your blood sugar (glucose) after you have not eaten for a while (fasting).  Blood pressure.  Lipid and cholesterol levels. These may be checked every 5 years starting at age 67.  Skin check.  Hepatitis C blood test.  Hepatitis B blood test.  Sexually transmitted disease (STD) testing.  BRCA-related cancer screening. This may be done if you have a family history of breast, ovarian, tubal, or peritoneal cancers.  Pelvic exam and Pap test. This may be done every 3 years starting at age 26. Starting at age 36, this may be done every 5  years if you have a Pap test in combination with an HPV test. Discuss your test results, treatment options, and if necessary, the need for more tests with your health care provider. Vaccines  Your health care provider may recommend certain vaccines, such as:  Influenza vaccine. This is recommended every year.  Tetanus, diphtheria, and acellular pertussis (Tdap, Td) vaccine. You may need a Td booster every 10 years.  Varicella vaccine. You may need this if you have not been vaccinated.  HPV vaccine. If you are 59 or younger, you may need three doses over 6 months.  Measles, mumps, and rubella (MMR) vaccine. You may need at least one dose of MMR. You may also need a second dose.  Pneumococcal 13-valent conjugate (PCV13) vaccine. You may need this if you have certain conditions and were not previously vaccinated.  Pneumococcal polysaccharide (PPSV23) vaccine. You may need one or two doses if you smoke cigarettes or if you have certain conditions.  Meningococcal vaccine. One dose is recommended if you are age 44-21 years and a first-year college student living in a residence hall, or if you have one of several medical conditions. You may also need additional booster doses.  Hepatitis A vaccine. You may need this if you have certain conditions or if you travel or work in places where you may be exposed to hepatitis A.  Hepatitis B vaccine. You may need this if you have certain conditions or if you travel or work in places where you may be exposed to hepatitis B.  Haemophilus influenzae type b (Hib) vaccine. You may need this  if you have certain risk factors. Talk to your health care provider about which screenings and vaccines you need and how often you need them. This information is not intended to replace advice given to you by your health care provider. Make sure you discuss any questions you have with your health care provider. Document Released: 07/21/2001 Document Revised: 02/12/2016  Document Reviewed: 03/26/2015 Elsevier Interactive Patient Education  2017 [employer].

## 2016-06-09 NOTE — Progress Notes (Signed)
Subjective:     Cassandra [name] is a 41 y.o. female and is here for a comprehensive physical exam. The patient reports no problems. Working on ancestry and connecting with her family. No new health concerns. She is still using her IUD and Megace (first 2 days) to control her cycle and diclofenac for pain.  Social History   Social History  . Marital status: Married    Spouse name: N/A  . Number of children: N/A  . Years of education: N/A   Occupational History  . Not on file.   Social History Main Topics  . Smoking status: Former Smoker    Types: Cigarettes    Quit date: 04/09/2015  . Smokeless tobacco: Never Used     Comment: ~2-3  cigarettes daily  . Alcohol use 0.0 oz/week     Comment: rare  . Drug use: No  . Sexual activity: Yes    Partners: Male    Birth control/ protection: Surgical     Comment: tubalization   Other Topics Concern  . Not on file   Social History Narrative   Lives with husband and 2 children, no pets   Occupation: call center rep   Edu: some college   Activity: no regular exercise.  Tries to walk around building.   Diet: 1 mt dew in am, rest water, fruits/vegetables daily    Health Maintenance  Topic Date Due  . PNEUMOCOCCAL POLYSACCHARIDE VACCINE (1) 07/15/1979  . FOOT EXAM  07/15/1987  . OPHTHALMOLOGY EXAM  07/15/1987  . URINE MICROALBUMIN  07/15/1987  . INFLUENZA VACCINE  01/07/2016  . HEMOGLOBIN A1C  05/03/2016  . PAP SMEAR  04/22/2018  . DTaP/Tdap/Td (2 - Td) 12/15/2022  . TETANUS/TDAP  12/15/2022  . HIV Screening  Completed    The following portions of the patient's history were reviewed and updated as appropriate: allergies, current medications, past family history, past medical history, past social history, past surgical history and problem list.  Review of Systems Pertinent items noted in HPI and remainder of comprehensive ROS otherwise negative.   Objective:    BP 126/84   Pulse 93   Ht 5\' 9"  (1.753 m)   Wt (!) 315 lb  (142.9 kg)   LMP 05/17/2016   BMI 46.52 kg/m  General appearance: alert, cooperative, appears stated age and morbidly obese Head: Normocephalic, without obvious abnormality, atraumatic Neck: no adenopathy, supple, symmetrical, trachea midline and thyroid not enlarged, symmetric, no tenderness/mass/nodules Lungs: clear to auscultation bilaterally Breasts: normal appearance, no masses or tenderness Heart: regular rate and rhythm, S1, S2 normal, no murmur, click, rub or gallop Abdomen: soft, non-tender; bowel sounds normal; no masses,  no organomegaly Pelvic: cervix normal in appearance, external genitalia normal, no adnexal masses or tenderness, no cervical motion tenderness, uterus normal size, shape, and consistency, vagina normal without discharge and IUD strings noted Extremities: extremities normal, atraumatic, no cyanosis or edema Pulses: 2+ and symmetric Skin: Skin color, texture, turgor normal. No rashes or lesions Lymph nodes: Cervical, supraclavicular, and axillary nodes normal. Neurologic: Grossly normal    Assessment:    Healthy female exam.      Plan:      Problem List Items Addressed This Visit      Unprioritized   Heavy menstrual bleeding   Relevant Medications   megestrol (MEGACE) 40 MG tablet   Dysmenorrhea   Relevant Medications   diclofenac (VOLTAREN) 75 MG EC tablet    Other Visit Diagnoses    Routine medical exam    -  Primary   Relevant Medications   Potassium 99 MG TABS   Other Relevant Orders   Flu Vaccine QUAD 36+ mos IM (Fluarix, Quad PF) (Completed)   Encounter for gynecological examination without abnormal finding       Screening for cervical cancer       Relevant Orders   Cytology - PAP      See After Visit Summary for Counseling Recommendations

## 2016-06-11 LAB — CYTOLOGY - PAP
Diagnosis: NEGATIVE
HPV: NOT DETECTED

## 2016-07-03 ENCOUNTER — Ambulatory Visit
Admission: RE | Admit: 2016-07-03 | Discharge: 2016-07-03 | Disposition: A | Discharge status: Home / Self Care | Payer: 59 | Source: Ambulatory Visit | Attending: Family Medicine | Admitting: Family Medicine

## 2016-07-03 ENCOUNTER — Ambulatory Visit (INDEPENDENT_AMBULATORY_CARE_PROVIDER_SITE_OTHER): Payer: 59 | Admitting: Family Medicine

## 2016-07-03 ENCOUNTER — Encounter: Payer: Self-pay | Admitting: Family Medicine

## 2016-07-03 VITALS — BP 128/80 | HR 84 | Temp 97.7°F | Wt 314.2 lb

## 2016-07-03 DIAGNOSIS — M545 Low back pain: Secondary | ICD-10-CM | POA: Diagnosis not present

## 2016-07-03 DIAGNOSIS — M79605 Pain in left leg: Secondary | ICD-10-CM

## 2016-07-03 DIAGNOSIS — Z6841 Body Mass Index (BMI) 40.0 and over, adult: Secondary | ICD-10-CM | POA: Diagnosis not present

## 2016-07-03 MED ORDER — PREDNISONE 20 MG PO TABS: ORAL_TABLET | ORAL | 0 refills | Status: DC

## 2016-07-03 NOTE — Progress Notes (Signed)
BP 128/80   Pulse 84   Temp 97.7 F (36.5 C) (Oral)   Wt (!) 314 lb 4 oz (142.5 kg)   BMI 46.41 kg/m    CC: back pain Subjective:    Patient ID: Cassandra [name], female    DOB: Apr 03, 1976, 41 y.o.   MRN: [id number]  HPI: Cassandra [name] is a 41 y.o. female presenting on 07/03/2016 for Back Pain (LBP radiating into buttocks and into legs)   Several month h/o worsening lower back pain now with radiation down bilateral lateral legs to knees. Worse with prolonged sitting or standing. Acutely worse this past week with some numbness of R pinky toe - has restarted flexeril, voltaren, heating pad. Endorses stiffness of lower back. + muscle spasms down legs. Denies inciting trauma/injury, saddle anesthesia, fevers, bowel/bladder incontinence.   H/o LBP when last seen 12/2015, thought lumbar strain. Treated with flexeril, voltaren tablets, heating pad and lower back exercises. This did improve.  Relevant past medical, surgical, family and social history reviewed and updated as indicated. Interim medical history since our last visit reviewed. Allergies and medications reviewed and updated. Current Outpatient Prescriptions on File Prior to Visit  Medication Sig  . amLODipine (NORVASC) 5 MG tablet TAKE 1 TABLET BY MOUTH EVERY DAY  . cyclobenzaprine (FLEXERIL) 10 MG tablet TAKE 1 TABLET (10 MG TOTAL) BY MOUTH 2 (TWO) TIMES DAILY AS NEEDED FOR MUSCLE SPASMS.  Marland Kitchen diclofenac (VOLTAREN) 75 MG EC tablet Take 1 tablet (75 mg total) by mouth 2 (two) times daily as needed.  Marland Kitchen efavirenz-emtricitabine-tenofovir (ATRIPLA) 600-200-300 MG per tablet Take 1 tablet by mouth at bedtime.  Marland Kitchen Fexofenadine HCl (ALLEGRA PO) Take by mouth daily as needed.   . hydrochlorothiazide (HYDRODIURIL) 25 MG tablet Take 1 tablet (25 mg total) by mouth daily.  . Loratadine (CLARITIN PO) Take by mouth daily as needed.   . megestrol (MEGACE) 40 MG tablet Take 1 tablet (40 mg total) by mouth 2 (two) times daily as needed.  .  Multiple Vitamins-Minerals (MULTIVITAMIN PO) Take 1 tablet by mouth daily.  Marland Kitchen omeprazole (PRILOSEC OTC) 20 MG tablet Take 20 mg by mouth daily as needed.   . Potassium 99 MG TABS Take by mouth.  . sertraline (ZOLOFT) 50 MG tablet Take 50 mg by mouth daily.   No current facility-administered medications on file prior to visit.     Review of Systems Per HPI unless specifically indicated in ROS section     Objective:    BP 128/80   Pulse 84   Temp 97.7 F (36.5 C) (Oral)   Wt (!) 314 lb 4 oz (142.5 kg)   BMI 46.41 kg/m   Wt Readings from Last 3 Encounters:  07/03/16 (!) 314 lb 4 oz (142.5 kg)  06/09/16 (!) 315 lb (142.9 kg)  12/20/15 (!) 314 lb 12.8 oz (142.8 kg)    Physical Exam  Constitutional: She is oriented to person, place, and time. She appears well-developed and well-nourished. No distress.  Musculoskeletal: Normal range of motion. She exhibits no edema.  ++ pain midline lumbar spine ++ lumbar paraspinous mm tenderness Neg SLR bilaterally. No pain with int/ext rotation at hip.  Neurological: She is alert and oriented to person, place, and time.  Reflex Scores:      Patellar reflexes are 2+ on the right side and 2+ on the left side. Sensation intact 5/5 strength BLE  Skin: Skin is warm and dry. No rash noted.  Nursing note and vitals reviewed.  Assessment & Plan:   Problem List Items Addressed This Visit    Lumbar pain with radiation down both legs - Primary    No red flags. Concern for bulging disc with radiculopathy. Treat with continued flexeril, start prednisone course, discussed importance of weight loss for control of lumbar back pain.  Given chronicity, check xray at [facility] today.      Relevant Medications   predniSONE (DELTASONE) 20 MG tablet   Other Relevant Orders   DG Lumbar Spine Complete   Morbid obesity with BMI of 45.0-49.9, adult ([place])       Follow up plan: Return if symptoms worsen or fail to improve.  Ria Bush, MD

## 2016-07-03 NOTE — Patient Instructions (Signed)
I am suspicious for bulging disc causing nerve root pinching - xrays ordered. Treat with prednisone course, continue flexeril and heating pad, and home exercises.  Update me with effect. If any loss of function of leg - let me know.

## 2016-07-03 NOTE — Assessment & Plan Note (Addendum)
No red flags. Concern for bulging disc with radiculopathy. Treat with continued flexeril, start prednisone course, discussed importance of weight loss for control of lumbar back pain.  Given chronicity, check xray at [facility] today.

## 2016-07-03 NOTE — Progress Notes (Signed)
Pre visit review using our clinic review tool, if applicable. No additional management support is needed unless otherwise documented below in the visit note. 

## 2016-07-05 ENCOUNTER — Encounter: Payer: Self-pay | Admitting: Family Medicine

## 2016-07-05 ENCOUNTER — Other Ambulatory Visit: Payer: Self-pay | Admitting: Family Medicine

## 2016-07-05 DIAGNOSIS — S32010A Wedge compression fracture of first lumbar vertebra, initial encounter for closed fracture: Secondary | ICD-10-CM | POA: Insufficient documentation

## 2016-07-05 MED ORDER — CALCIUM-VITAMIN D 600-400 MG-UNIT PO TABS: 1.0000 | ORAL_TABLET | Freq: Every day | ORAL | Status: DC

## 2016-07-15 ENCOUNTER — Encounter: Payer: Self-pay | Admitting: Family Medicine

## 2016-07-28 ENCOUNTER — Other Ambulatory Visit: Payer: Self-pay | Admitting: Family Medicine

## 2016-09-10 ENCOUNTER — Encounter: Payer: Self-pay | Admitting: Family Medicine

## 2016-09-10 ENCOUNTER — Other Ambulatory Visit: Payer: Self-pay | Admitting: Family Medicine

## 2016-09-10 ENCOUNTER — Ambulatory Visit
Admission: RE | Admit: 2016-09-10 | Discharge: 2016-09-10 | Disposition: A | Discharge status: Home / Self Care | Payer: 59 | Source: Ambulatory Visit | Attending: Family Medicine | Admitting: Family Medicine

## 2016-09-10 ENCOUNTER — Ambulatory Visit (INDEPENDENT_AMBULATORY_CARE_PROVIDER_SITE_OTHER): Payer: 59 | Admitting: Family Medicine

## 2016-09-10 VITALS — BP 146/100 | HR 113 | Temp 98.2°F | Wt 313.2 lb

## 2016-09-10 DIAGNOSIS — R112 Nausea with vomiting, unspecified: Secondary | ICD-10-CM | POA: Diagnosis not present

## 2016-09-10 DIAGNOSIS — R1011 Right upper quadrant pain: Secondary | ICD-10-CM | POA: Diagnosis present

## 2016-09-10 DIAGNOSIS — K8020 Calculus of gallbladder without cholecystitis without obstruction: Secondary | ICD-10-CM

## 2016-09-10 LAB — LIPASE: Lipase: 15 U/L (ref 11.0–59.0)

## 2016-09-10 LAB — COMPREHENSIVE METABOLIC PANEL
ALT: 67 U/L — ABNORMAL HIGH (ref 0–35)
AST: 60 U/L — ABNORMAL HIGH (ref 0–37)
Albumin: 4.2 g/dL (ref 3.5–5.2)
Alkaline Phosphatase: 115 U/L (ref 39–117)
BUN: 9 mg/dL (ref 6–23)
CO2: 31 mEq/L (ref 19–32)
Calcium: 9.5 mg/dL (ref 8.4–10.5)
Chloride: 102 mEq/L (ref 96–112)
Creatinine, Ser: 0.84 mg/dL (ref 0.40–1.20)
GFR: 80.16 mL/min (ref >60.00)
Glucose, Bld: 115 mg/dL — ABNORMAL HIGH (ref 70–99)
Potassium: 3.4 mEq/L — ABNORMAL LOW (ref 3.5–5.1)
Sodium: 138 mEq/L (ref 135–145)
Total Bilirubin: 0.5 mg/dL (ref 0.2–1.2)
Total Protein: 7.7 g/dL (ref 6.0–8.3)

## 2016-09-10 LAB — CBC
HCT: 40.4 % (ref 36.0–46.0)
Hemoglobin: 13.8 g/dL (ref 12.0–15.0)
MCHC: 34.1 g/dL (ref 30.0–36.0)
MCV: 97 fl (ref 78.0–100.0)
Platelets: 337 10*3/uL (ref 150.0–400.0)
RBC: 4.17 Mil/uL (ref 3.87–5.11)
RDW: 12.2 % (ref 11.5–15.5)
WBC: 8 10*3/uL (ref 4.0–10.5)

## 2016-09-10 MED ORDER — ONDANSETRON 4 MG PO TBDP: 4.0000 mg | ORAL_TABLET | Freq: Three times a day (TID) | ORAL | 0 refills | Status: DC | PRN

## 2016-09-10 MED ORDER — OMEPRAZOLE 40 MG PO CPDR: 40.0000 mg | DELAYED_RELEASE_CAPSULE | Freq: Every day | ORAL | 3 refills | Status: DC

## 2016-09-10 NOTE — Progress Notes (Signed)
Pre visit review using our clinic review tool, if applicable. No additional management support is needed unless otherwise documented below in the visit note. 

## 2016-09-10 NOTE — Progress Notes (Signed)
Subjective:  Patient ID: Cassandra [name], female    DOB: [birth year]  Age: 41 y.o. MRN: [id number]  CC: Nausea, vomiting, Abdominal pain  HPI:  41 year old female with HIV, hypertension, morbid obesity, GERD presents with the above complaints.  Patient states that last night she developed sudden onset nausea and vomiting. She reports that she's had some heartburn.  She's also had epigastric or right upper quadrant pain. Pain is severe. She alludes to the fact that she's had a history of gallstones although I cannot find any documentation of this. No reported fever. She does report "sweats" and chills. She states that she is quite uncomfortable and can't seem to get comfortable in regards to her abdominal pain. She states that all this started after a meal. No known relieving factors. No known exacerbating factors. No other associative symptoms. No complaints at this time.  Social Hx   Social History   Social History  . Marital status: Married    Spouse name: N/A  . Number of children: N/A  . Years of education: N/A   Social History Main Topics  . Smoking status: Former Smoker    Types: Cigarettes    Quit date: 04/09/2015  . Smokeless tobacco: Never Used     Comment: ~2-3  cigarettes daily  . Alcohol use 0.0 oz/week     Comment: rare  . Drug use: No  . Sexual activity: Yes    Partners: Male    Birth control/ protection: Surgical     Comment: tubalization   Other Topics Concern  . None   Social History Narrative   Lives with husband and 2 children, no pets   Occupation: call center rep   Edu: some college   Activity: no regular exercise.  Tries to walk around building.   Diet: 1 mt dew in am, rest water, fruits/vegetables daily     Review of Systems  Constitutional: Positive for chills.  Gastrointestinal: Positive for abdominal pain, nausea and vomiting.   Objective:  BP (!) 146/100   Pulse (!) 113   Temp 98.2 F (36.8 C) (Oral)   Wt (!) 313 lb 4 oz (142.1 kg)    SpO2 97%   BMI 46.26 kg/m   BP/Weight 09/10/2016 09/14/8117 06/12/7827  Systolic BP 562 130 865  Diastolic BP 784 80 84  Wt. (Lbs) 313.25 314.25 315  BMI 46.26 46.41 46.52   Physical Exam  Constitutional: She is oriented to person, place, and time. She appears well-developed. No distress.  Cardiovascular: Regular rhythm.   Tachycardic.  Pulmonary/Chest: Effort normal and breath sounds normal.  Abdominal:  Soft. Very tender to palpation in the epigastric region as well as the right upper quadrant. Positive Murphy sign.  Neurological: She is alert and oriented to person, place, and time.  Psychiatric: She has a normal mood and affect.  Vitals reviewed.   Lab Results  Component Value Date   WBC 6.6 05/17/2014   HGB 13.0 05/17/2014   HCT 39.4 05/17/2014   PLT 266.0 05/17/2014   GLUCOSE 124 (H) 12/20/2015   CHOL 182 11/01/2015   TRIG 106.0 11/01/2015   HDL 43.60 11/01/2015   LDLCALC 117 (H) 11/01/2015   ALT 22 11/01/2015   AST 23 11/01/2015   NA 141 12/20/2015   K 3.5 12/20/2015   CL 102 12/20/2015   CREATININE 0.81 12/20/2015   BUN 10 12/20/2015   CO2 31 12/20/2015   TSH 1.164 10/19/2012   HGBA1C 5.7 11/01/2015    Assessment & Plan:  Problem List Items Addressed This Visit    RUQ pain - Primary    New problem. Patient with epigastric and right upper quadrant pain. Concern for gallbladder pathology. Arranging ultrasound. Advised omeprazole 40 mg daily as well (given potential gastritis, GERD). Labs today.      Relevant Orders   CBC   Comp Met (CMET)   Lipase   US Abdomen Limited RUQ   Nausea and vomiting    Treating with Zofran.      Relevant Orders   CBC   Comp Met (CMET)   Lipase   US Abdomen Limited RUQ      Meds ordered this encounter  Medications  . ondansetron (ZOFRAN ODT) 4 MG disintegrating tablet    Sig: Take 1 tablet (4 mg total) by mouth every 8 (eight) hours as needed for nausea or vomiting.    Dispense:  20 tablet    Refill:  0  .  omeprazole (PRILOSEC) 40 MG capsule    Sig: Take 1 capsule (40 mg total) by mouth daily.    Dispense:  30 capsule    Refill:  3     Follow-up: PRN  [place]

## 2016-09-10 NOTE — Assessment & Plan Note (Signed)
Treating with Zofran.

## 2016-09-10 NOTE — Assessment & Plan Note (Signed)
New problem. Patient with epigastric and right upper quadrant pain. Concern for gallbladder pathology. Arranging ultrasound. Advised omeprazole 40 mg daily as well (given potential gastritis, GERD). Labs today.

## 2016-09-10 NOTE — Patient Instructions (Signed)
We will call with your lab results and [place] results.  Zofran for nausea  Dr. Lacinda Axon

## 2016-09-11 ENCOUNTER — Other Ambulatory Visit: Payer: Self-pay | Admitting: Family Medicine

## 2016-09-11 DIAGNOSIS — R7989 Other specified abnormal findings of blood chemistry: Secondary | ICD-10-CM

## 2016-09-11 DIAGNOSIS — R945 Abnormal results of liver function studies: Principal | ICD-10-CM

## 2016-10-12 ENCOUNTER — Other Ambulatory Visit: Payer: Self-pay

## 2016-10-14 ENCOUNTER — Other Ambulatory Visit (INDEPENDENT_AMBULATORY_CARE_PROVIDER_SITE_OTHER): Payer: 59

## 2016-10-14 DIAGNOSIS — R7989 Other specified abnormal findings of blood chemistry: Secondary | ICD-10-CM | POA: Diagnosis not present

## 2016-10-14 DIAGNOSIS — R945 Abnormal results of liver function studies: Principal | ICD-10-CM

## 2016-10-14 LAB — HEPATIC FUNCTION PANEL
ALT: 20 U/L (ref 0–35)
AST: 21 U/L (ref 0–37)
Albumin: 3.8 g/dL (ref 3.5–5.2)
Alkaline Phosphatase: 101 U/L (ref 39–117)
Bilirubin, Direct: 0.1 mg/dL (ref 0.0–0.3)
Total Bilirubin: 0.3 mg/dL (ref 0.2–1.2)
Total Protein: 6.9 g/dL (ref 6.0–8.3)

## 2016-10-30 ENCOUNTER — Other Ambulatory Visit: Payer: Self-pay

## 2016-10-30 NOTE — Telephone Encounter (Signed)
Lov 09/10/16 You rxd 40 mg omeprazole  No office scheduled  Ok to override med

## 2016-11-03 ENCOUNTER — Other Ambulatory Visit: Payer: Self-pay | Admitting: General Surgery

## 2016-11-03 MED ORDER — OMEPRAZOLE 40 MG PO CPDR: 40.0000 mg | DELAYED_RELEASE_CAPSULE | Freq: Every day | ORAL | 0 refills | Status: DC

## 2016-12-29 ENCOUNTER — Other Ambulatory Visit: Payer: Self-pay | Admitting: Family Medicine

## 2017-01-29 ENCOUNTER — Other Ambulatory Visit: Payer: Self-pay | Admitting: Family Medicine

## 2017-03-11 ENCOUNTER — Other Ambulatory Visit: Payer: Self-pay | Admitting: Family Medicine

## 2017-03-29 ENCOUNTER — Other Ambulatory Visit: Payer: Self-pay | Admitting: Family Medicine

## 2017-04-23 ENCOUNTER — Other Ambulatory Visit: Payer: Self-pay

## 2017-04-23 ENCOUNTER — Encounter: Payer: Self-pay | Admitting: Emergency Medicine

## 2017-04-23 ENCOUNTER — Emergency Department
Admission: EM | Admit: 2017-04-23 | Discharge: 2017-04-23 | Disposition: A | Discharge status: Home / Self Care | Payer: 59 | Attending: Emergency Medicine | Admitting: Emergency Medicine

## 2017-04-23 ENCOUNTER — Emergency Department: Payer: 59

## 2017-04-23 DIAGNOSIS — Y999 Unspecified external cause status: Secondary | ICD-10-CM | POA: Diagnosis not present

## 2017-04-23 DIAGNOSIS — Z87891 Personal history of nicotine dependence: Secondary | ICD-10-CM | POA: Diagnosis not present

## 2017-04-23 DIAGNOSIS — I10 Essential (primary) hypertension: Secondary | ICD-10-CM | POA: Insufficient documentation

## 2017-04-23 DIAGNOSIS — Y9301 Activity, walking, marching and hiking: Secondary | ICD-10-CM | POA: Insufficient documentation

## 2017-04-23 DIAGNOSIS — S99911A Unspecified injury of right ankle, initial encounter: Secondary | ICD-10-CM | POA: Diagnosis present

## 2017-04-23 DIAGNOSIS — S93491A Sprain of other ligament of right ankle, initial encounter: Secondary | ICD-10-CM | POA: Insufficient documentation

## 2017-04-23 DIAGNOSIS — Y929 Unspecified place or not applicable: Secondary | ICD-10-CM | POA: Diagnosis not present

## 2017-04-23 DIAGNOSIS — Z79899 Other long term (current) drug therapy: Secondary | ICD-10-CM | POA: Insufficient documentation

## 2017-04-23 DIAGNOSIS — W1789XA Other fall from one level to another, initial encounter: Secondary | ICD-10-CM | POA: Insufficient documentation

## 2017-04-23 MED ORDER — MELOXICAM 15 MG PO TABS: 15.0000 mg | ORAL_TABLET | Freq: Every day | ORAL | 0 refills | Status: AC

## 2017-04-23 NOTE — ED Triage Notes (Signed)
Stepped off the curb and injured right ankle about 830am.

## 2017-04-23 NOTE — ED Provider Notes (Signed)
[facility] Emergency Department Provider Note  ____________________________________________  Time seen: Approximately 7:37 PM  I have reviewed the triage vital signs and the nursing notes.   HISTORY  Chief Complaint Ankle Pain    HPI Cassandra [name] is a 41 y.o. female presents to the emergency department with 10 out of 10 right ankle pain after patient sustained an inversion type ankle injury while descending from a curb.  Patient denies radiculopathy, weakness or changes in sensation of the right lower extremity.  No skin compromise.  Patient has sustained ankle sprains in the past.  No alleviating measures have been attempted.   Past Medical History:  Diagnosis Date  . 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 Diabetes in pregnancy 02/27/2014  . Gallstones 06/2016   by xray  . Generalized headaches    frequent  . History of abnormal Pap smear    remote  . History of gestational diabetes    first 2 pregnancies  . 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[place])   . HSV-2 seropositive   . Morbid obesity ([place])   . Periodontal disease 08/2011   currently getting dental work  . Tobacco use     Patient Active Problem List   Diagnosis Date Noted  . RUQ pain 09/10/2016  . Nausea and vomiting 09/10/2016  . Closed compression fracture of L1 lumbar vertebra ([place]) 07/05/2016  . Lumbar pain with radiation down both legs 12/20/2015  . Health maintenance examination 05/25/2014  . Ulnar neuropathy 06/28/2013  . Heavy menstrual bleeding 10/19/2012  . Hypertension 09/14/2012  . HIV (human immunodeficiency virus infection) ([place]) 01/04/2012  . Varicose vein 01/01/2012  . Mood disorder ([place]) 09/02/2011  . Reflux 09/02/2011  . Morbid obesity with BMI of 45.0-49.9, adult ([place])   . Ex-smoker     Past Surgical History:  Procedure Laterality Date  . CESAREAN SECTION  [phone]   x2  . CHOLECYSTECTOMY    . INTRAUTERINE DEVICE INSERTION     Mirena  . WISDOM TOOTH EXTRACTION      x 4    Prior to Admission medications   Medication Sig Start Date End Date Taking? Authorizing Provider  amLODipine (NORVASC) 5 MG tablet TAKE 1 TABLET BY MOUTH EVERY DAY 07/28/16   Ria Bush, MD  amLODipine (NORVASC) 5 MG tablet TAKE 1 TABLET DAILY 03/29/17   Ria Bush, MD  Calcium Carb-Cholecalciferol (CALCIUM-VITAMIN D) 600-400 MG-UNIT TABS Take 1 tablet by mouth daily. 07/05/16   Ria Bush, MD  cyclobenzaprine (FLEXERIL) 10 MG tablet TAKE 1 TABLET (10 MG TOTAL) BY MOUTH 2 (TWO) TIMES DAILY AS NEEDED FOR MUSCLE SPASMS. 09/24/15   Ria Bush, MD  diclofenac (VOLTAREN) 75 MG EC tablet Take 1 tablet (75 mg total) by mouth 2 (two) times daily as needed. 06/09/16   [name], MD  efavirenz-emtricitabine-tenofovir (ATRIPLA) 600-200-300 MG per tablet Take 1 tablet by mouth at bedtime.    [provider]  Fexofenadine HCl (ALLEGRA PO) Take by mouth daily as needed.     [provider]  hydrochlorothiazide (HYDRODIURIL) 25 MG tablet TAKE 1 TABLET DAILY 03/11/17   Ria Bush, MD  Loratadine (CLARITIN PO) Take by mouth daily as needed.     [provider]  megestrol (MEGACE) 40 MG tablet Take 1 tablet (40 mg total) by mouth 2 (two) times daily as needed. 06/09/16   [name], MD  meloxicam (MOBIC) 15 MG tablet Take 1 tablet (15 mg total) daily for 7 days  by mouth. 04/23/17 04/30/17  Lannie Fields, PA-C  Multiple Vitamins-Minerals (MULTIVITAMIN PO) Take 1 tablet by mouth daily.    [provider]  omeprazole (PRILOSEC OTC) 20 MG tablet Take 20 mg by mouth daily as needed.     [provider]  omeprazole (PRILOSEC) 40 MG capsule TAKE 1 CAPSULE BY MOUTH EVERY DAY 01/29/17   Thersa Salt G, DO  ondansetron (ZOFRAN ODT) 4 MG disintegrating tablet Take 1 tablet (4 mg total) by mouth every 8 (eight) hours as needed for nausea or vomiting. 09/10/16   Coral Spikes, DO  Potassium 99 MG TABS Take by mouth.    [provider]  sertraline (ZOLOFT) 50 MG tablet Take 50 mg by mouth daily.    [provider]    Allergies Patient has no known allergies.  Family History  Problem Relation Age of Onset  . Hypertension Mother   . Hypertension Father   . Diabetes Maternal Grandmother        s/p amputation  . Hyperlipidemia Maternal Grandmother   . Hypertension Maternal Grandmother   . Stroke Maternal Grandmother   . Cancer Maternal Grandfather        skin cancer  . Hyperlipidemia Maternal Grandfather   . Hypertension Maternal Grandfather   . Seizures Paternal Grandmother 25       deceased  . Coronary artery disease Paternal Grandfather 66       MI    Social History Social History   Tobacco Use  . Smoking status: Former Smoker    Types: Cigarettes    Last attempt to quit: 04/09/2015    Years since quitting: 2.0  . Smokeless tobacco: Never Used  . Tobacco comment: ~2-3  cigarettes daily  Substance Use Topics  . Alcohol use: Yes    Alcohol/week: 0.0 oz    Comment: rare  . Drug use: No     Review of Systems  Constitutional: No fever/chills Eyes: No visual changes. No discharge ENT: No upper respiratory complaints. Cardiovascular: no chest pain. Respiratory: no cough. No SOB. Musculoskeletal: Patient has right ankle pain.  Skin: Negative for rash, abrasions, lacerations, ecchymosis. Neurological: Negative for headaches, focal weakness or numbness.   ____________________________________________   PHYSICAL EXAM:  VITAL SIGNS: ED Triage Vitals  Enc Vitals Group     BP 04/23/17 1824 (!) 151/98     Pulse Rate 04/23/17 1821 (!) 103     Resp 04/23/17 1821 16     Temp 04/23/17 1821 97.7 F (36.5 C)     Temp Source 04/23/17 1821 Oral     SpO2 04/23/17 1821 98 %     Weight 04/23/17 1822 (!) 315 lb (142.9 kg)     Height 04/23/17 1822 5\' 9"  (1.753 m)     Head Circumference --      Peak Flow --      Pain Score 04/23/17 1821 6     Pain Loc --      Pain Edu? --      Excl. in [place]?  --      Constitutional: Alert and oriented. Well appearing and in no acute distress. Eyes: Conjunctivae are normal. PERRL. EOMI. Head: Atraumatic. Cardiovascular: Normal rate, regular rhythm. Normal S1 and S2.  Good peripheral circulation. Respiratory: Normal respiratory effort without tachypnea or retractions. Lungs CTAB. Good air entry to the bases with no decreased or absent breath sounds. Musculoskeletal: Patient is able to perform limited range of motion at the right ankle, likely secondary to  pain.  Patient has pain with palpation of the anterior talofibular ligament.  Palpable dorsalis pedis pulse, right.  No pain was elicited with palpation of the fibular head. Neurologic:  Normal speech and language. No gross focal neurologic deficits are appreciated.  Skin:  Skin is warm, dry and intact. No rash noted. Psychiatric: Mood and affect are normal. Speech and behavior are normal. Patient exhibits appropriate insight and judgement.   ____________________________________________   LABS (all labs ordered are listed, but only abnormal results are displayed)  Labs Reviewed - No data to display ____________________________________________  EKG   ____________________________________________  RADIOLOGY Unk Pinto, personally viewed and evaluated these images (plain radiographs) as part of my medical decision making, as well as reviewing the written report by the radiologist.  Dg Ankle Complete Right  Result Date: 04/23/2017 CLINICAL DATA:  Right ankle pain after falling on her back porch. EXAM: RIGHT ANKLE - COMPLETE 3+ VIEW COMPARISON:  None. FINDINGS: Mild diffuse soft tissue swelling. No fracture, dislocation or effusion. Moderate-sized inferior calcaneal spur. IMPRESSION: No fracture. Electronically Signed   By: Claudie Revering M.D.   On: 04/23/2017 19:11    ____________________________________________    PROCEDURES  Procedure(s) performed:     Procedures    Medications - No data to display   ____________________________________________   INITIAL IMPRESSION / ASSESSMENT AND PLAN / ED COURSE  Pertinent labs & imaging results that were available during my care of the patient were reviewed by me and considered in my medical decision making (see chart for details).  Review of the [place] CSRS was performed in accordance of the [place] prior to dispensing any controlled drugs.  Clinical Course as of Apr 23 1936  Ludwig Clarks Apr 23, 2017  1927 DG Ankle Complete Right [JW]  1928 DG Ankle Complete Right [JW]    Clinical Course User Index [JW] Vallarie Mare M, PA-C    Assessment and plan Right ankle pain Patient presents to the emergency department with right ankle pain after an inversion type injury.  X-ray examination reveals no acute fractures or bony abnormalities.  An Ace wrap was applied in the emergency department.  Differential diagnosis originally included fracture versus ankle sprain.  History and physical exam findings are more consistent with ankle sprain.  Rest, ice, compression and elevation were recommended.  A referral was made to podiatry.  All patient questions were answered.     ____________________________________________  FINAL CLINICAL IMPRESSION(S) / ED DIAGNOSES  Final diagnoses:  Sprain of anterior talofibular ligament of right ankle, initial encounter      NEW MEDICATIONS STARTED DURING THIS VISIT:  ED Discharge Orders        Ordered    meloxicam (MOBIC) 15 MG tablet  Daily     04/23/17 1934          This chart was dictated using voice recognition software/Dragon. Despite best efforts to proofread, errors can occur which can change the meaning. Any change was purely unintentional.    Lannie Fields, PA-C 04/23/17 1942    Nena Polio, MD 04/23/17 2259

## 2017-04-23 NOTE — ED Notes (Addendum)
Patient discharged, and medications covered.  Patient taken to lobby by Nicki Reaper, EDT

## 2017-05-06 ENCOUNTER — Encounter: Payer: Self-pay | Admitting: Radiology

## 2017-06-16 ENCOUNTER — Other Ambulatory Visit: Payer: Self-pay | Admitting: Family Medicine

## 2017-08-01 ENCOUNTER — Other Ambulatory Visit: Payer: Self-pay | Admitting: Family Medicine

## 2017-08-02 NOTE — Telephone Encounter (Signed)
Dr. G patient.  

## 2017-08-03 NOTE — Telephone Encounter (Signed)
Please see office note and imaging report when pt saw Dr Marsa Aris 09/10/16;? Gallstones;is it OK to refill omeprazole.

## 2017-09-25 ENCOUNTER — Other Ambulatory Visit: Payer: Self-pay | Admitting: Family Medicine

## 2017-10-05 ENCOUNTER — Encounter: Payer: Self-pay | Admitting: Emergency Medicine

## 2017-10-05 ENCOUNTER — Emergency Department: Payer: No Typology Code available for payment source

## 2017-10-05 ENCOUNTER — Emergency Department
Admission: EM | Admit: 2017-10-05 | Discharge: 2017-10-05 | Disposition: A | Discharge status: Home / Self Care | Payer: No Typology Code available for payment source | Attending: Emergency Medicine | Admitting: Emergency Medicine

## 2017-10-05 ENCOUNTER — Other Ambulatory Visit: Payer: Self-pay

## 2017-10-05 DIAGNOSIS — S1980XA Other specified injuries of unspecified part of neck, initial encounter: Secondary | ICD-10-CM | POA: Diagnosis present

## 2017-10-05 DIAGNOSIS — Y92410 Unspecified street and highway as the place of occurrence of the external cause: Secondary | ICD-10-CM | POA: Diagnosis not present

## 2017-10-05 DIAGNOSIS — B20 Human immunodeficiency virus [HIV] disease: Secondary | ICD-10-CM | POA: Diagnosis not present

## 2017-10-05 DIAGNOSIS — Y9389 Activity, other specified: Secondary | ICD-10-CM | POA: Diagnosis not present

## 2017-10-05 DIAGNOSIS — Z79899 Other long term (current) drug therapy: Secondary | ICD-10-CM | POA: Diagnosis not present

## 2017-10-05 DIAGNOSIS — S29012A Strain of muscle and tendon of back wall of thorax, initial encounter: Secondary | ICD-10-CM | POA: Insufficient documentation

## 2017-10-05 DIAGNOSIS — I10 Essential (primary) hypertension: Secondary | ICD-10-CM | POA: Diagnosis not present

## 2017-10-05 DIAGNOSIS — S161XXA Strain of muscle, fascia and tendon at neck level, initial encounter: Secondary | ICD-10-CM | POA: Diagnosis not present

## 2017-10-05 DIAGNOSIS — Y999 Unspecified external cause status: Secondary | ICD-10-CM | POA: Diagnosis not present

## 2017-10-05 DIAGNOSIS — Z87891 Personal history of nicotine dependence: Secondary | ICD-10-CM | POA: Diagnosis not present

## 2017-10-05 MED ORDER — METHOCARBAMOL 500 MG PO TABS: ORAL_TABLET | ORAL | 0 refills | Status: DC

## 2017-10-05 MED ORDER — HYDROCODONE-ACETAMINOPHEN 5-325 MG PO TABS: 1.0000 | ORAL_TABLET | Freq: Four times a day (QID) | ORAL | 0 refills | Status: DC | PRN

## 2017-10-05 MED ORDER — HYDROCODONE-ACETAMINOPHEN 5-325 MG PO TABS
1.0000 | ORAL_TABLET | Freq: Once | ORAL | Status: AC
  Administered 2017-10-05: 1 via ORAL
  Filled 2017-10-05: qty 1

## 2017-10-05 MED ORDER — METHOCARBAMOL 500 MG PO TABS
1000.0000 mg | ORAL_TABLET | Freq: Once | ORAL | Status: AC
Start: 2017-10-05 — End: 2017-10-05
  Administered 2017-10-05: 1000 mg via ORAL
  Filled 2017-10-05: qty 2
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
## 2017-10-05 NOTE — Discharge Instructions (Addendum)
Begin using warm moist compresses to your neck or ice as needed for discomfort.  Follow-up with your primary care provider if any continued problems.  You were given Robaxin and pain medication while in the emergency department.  This can be continued every 6 hours as needed.  Be aware that both medications could cause drowsiness and increased risk for injury.  You should not drive while taking this medication.  You are also given a note to remain out of work for the next 2 days.  If extended amount of time is needed you will need to contact your primary care provider.

## 2017-10-08 ENCOUNTER — Encounter: Payer: Self-pay | Admitting: Internal Medicine

## 2017-10-08 ENCOUNTER — Ambulatory Visit (INDEPENDENT_AMBULATORY_CARE_PROVIDER_SITE_OTHER): Payer: 59 | Admitting: Internal Medicine

## 2017-10-08 VITALS — BP 130/88 | HR 74 | Temp 98.3°F | Ht 69.0 in | Wt 343.0 lb

## 2017-10-08 DIAGNOSIS — S139XXA Sprain of joints and ligaments of unspecified parts of neck, initial encounter: Secondary | ICD-10-CM | POA: Insufficient documentation

## 2017-10-08 NOTE — Patient Instructions (Signed)
Try heat on your neck and ibuprofen 600mg  (3 of the over the counter) tabs three times a day after eating for the next week or so. Save the hydrocodone and muscle relaxer to help you sleep.
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
## 2017-10-13 ENCOUNTER — Telehealth: Payer: Self-pay
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
## 2017-10-14 NOTE — Telephone Encounter (Signed)
Left message letting pt know paperwork is ready for pick up and she will need to turn in Copy for pt Copy for scan

## 2017-10-14 NOTE — Telephone Encounter (Signed)
Form done No charge 

## 2017-11-08 ENCOUNTER — Encounter: Payer: Self-pay | Admitting: Family Medicine

## 2017-11-09 ENCOUNTER — Telehealth: Payer: Self-pay | Admitting: Family Medicine

## 2017-11-09 DIAGNOSIS — Z0279 Encounter for issue of other medical certificate: Secondary | ICD-10-CM

## 2017-11-09 NOTE — Telephone Encounter (Signed)
Forms done $20 charge

## 2017-11-09 NOTE — Telephone Encounter (Signed)
[employer] faxed [place] [employer] medical evaluation form needing more information In dr Alla German in box For review and signature

## 2017-11-10 NOTE — Telephone Encounter (Signed)
Left message letting pt know paperwork has been faxed ° °Copy for pt °Copy for scan °Copy for billing °

## 2017-11-11 NOTE — Progress Notes (Signed)
LAST PAP 06/2016- NORMAL  Needs MAMMOGRAM LAST TDAP 2014  C/O menorrhagia - bleeding 2.5 wks every 2-3 months. She has ran out of Megace. She desires hysterectomy

## 2017-11-12 ENCOUNTER — Encounter: Payer: Self-pay | Admitting: Family Medicine

## 2017-11-12 ENCOUNTER — Encounter (HOSPITAL_COMMUNITY): Payer: Self-pay

## 2017-11-12 ENCOUNTER — Ambulatory Visit (INDEPENDENT_AMBULATORY_CARE_PROVIDER_SITE_OTHER): Payer: 59 | Admitting: Family Medicine

## 2017-11-12 VITALS — BP 154/77 | HR 94 | Ht 69.0 in | Wt 349.0 lb

## 2017-11-12 DIAGNOSIS — Z01411 Encounter for gynecological examination (general) (routine) with abnormal findings: Secondary | ICD-10-CM

## 2017-11-12 DIAGNOSIS — Z124 Encounter for screening for malignant neoplasm of cervix: Secondary | ICD-10-CM | POA: Diagnosis not present

## 2017-11-12 DIAGNOSIS — N920 Excessive and frequent menstruation with regular cycle: Secondary | ICD-10-CM

## 2017-11-12 DIAGNOSIS — Z01419 Encounter for gynecological examination (general) (routine) without abnormal findings: Secondary | ICD-10-CM

## 2017-11-12 DIAGNOSIS — Z1151 Encounter for screening for human papillomavirus (HPV): Secondary | ICD-10-CM

## 2017-11-12 DIAGNOSIS — Z029 Encounter for administrative examinations, unspecified: Secondary | ICD-10-CM

## 2017-11-12 NOTE — Patient Instructions (Addendum)
Preventive Care 18-39 Years, Female Preventive care refers to lifestyle choices and visits with your health care provider that can promote health and wellness. What does preventive care include?  A yearly physical exam. This is also called an annual well check.  Dental exams once or twice a year.  Routine eye exams. Ask your health care provider how often you should have your eyes checked.  Personal lifestyle choices, including: ? Daily care of your teeth and gums. ? Regular physical activity. ? Eating a healthy diet. ? Avoiding tobacco and drug use. ? Limiting alcohol use. ? Practicing safe sex. ? Taking vitamin and mineral supplements as recommended by your health care provider. What happens during an annual well check? The services and screenings done by your health care provider during your annual well check will depend on your age, overall health, lifestyle risk factors, and family history of disease. Counseling Your health care provider may ask you questions about your:  Alcohol use.  Tobacco use.  Drug use.  Emotional well-being.  Home and relationship well-being.  Sexual activity.  Eating habits.  Work and work Statistician.  Method of birth control.  Menstrual cycle.  Pregnancy history.  Screening You may have the following tests or measurements:  Height, weight, and BMI.  Diabetes screening. This is done by checking your blood sugar (glucose) after you have not eaten for a while (fasting).  Blood pressure.  Lipid and cholesterol levels. These may be checked every 5 years starting at age 38.  Skin check.  Hepatitis C blood test.  Hepatitis B blood test.  Sexually transmitted disease (STD) testing.  BRCA-related cancer screening. This may be done if you have a family history of breast, ovarian, tubal, or peritoneal cancers.  Pelvic exam and Pap test. This may be done every 3 years starting at age 38. Starting at age 30, this may be done  every 5 years if you have a Pap test in combination with an HPV test.  Discuss your test results, treatment options, and if necessary, the need for more tests with your health care provider. Vaccines Your health care provider may recommend certain vaccines, such as:  Influenza vaccine. This is recommended every year.  Tetanus, diphtheria, and acellular pertussis (Tdap, Td) vaccine. You may need a Td booster every 10 years.  Varicella vaccine. You may need this if you have not been vaccinated.  HPV vaccine. If you are 39 or younger, you may need three doses over 6 months.  Measles, mumps, and rubella (MMR) vaccine. You may need at least one dose of MMR. You may also need a second dose.  Pneumococcal 13-valent conjugate (PCV13) vaccine. You may need this if you have certain conditions and were not previously vaccinated.  Pneumococcal polysaccharide (PPSV23) vaccine. You may need one or two doses if you smoke cigarettes or if you have certain conditions.  Meningococcal vaccine. One dose is recommended if you are age 68-21 years and a first-year college student living in a residence hall, or if you have one of several medical conditions. You may also need additional booster doses.  Hepatitis A vaccine. You may need this if you have certain conditions or if you travel or work in places where you may be exposed to hepatitis A.  Hepatitis B vaccine. You may need this if you have certain conditions or if you travel or work in places where you may be exposed to hepatitis B.  Haemophilus influenzae type b (Hib) vaccine. You may need this  if you have certain risk factors.  Talk to your health care provider about which screenings and vaccines you need and how often you need them. This information is not intended to replace advice given to you by your health care provider. Make sure you discuss any questions you have with your health care provider. Document Released: 07/21/2001 Document Revised:  02/12/2016 Document Reviewed: 03/26/2015 Elsevier Interactive Patient Education  2018 [employer].  Endometrial Ablation Endometrial ablation is a procedure that destroys the thin inner layer of the lining of the uterus (endometrium). This procedure may be done:  To stop heavy periods.  To stop bleeding that is causing anemia.  To control irregular bleeding.  To treat bleeding caused by small tumors (fibroids) in the endometrium.  This procedure is often an alternative to major surgery, such as removal of the uterus and cervix (hysterectomy). As a result of this procedure:  You may not be able to have children. However, if you are premenopausal (you have not gone through menopause): ? You may still have a small chance of getting pregnant. ? You will need to use a reliable method of birth control after the procedure to prevent pregnancy.  You may stop having a menstrual period, or you may have only a small amount of bleeding during your period. Menstruation may return several years after the procedure.  Tell a health care provider about:  Any allergies you have.  All medicines you are taking, including vitamins, herbs, eye drops, creams, and over-the-counter medicines.  Any problems you or family members have had with the use of anesthetic medicines.  Any blood disorders you have.  Any surgeries you have had.  Any medical conditions you have. What are the risks? Generally, this is a safe procedure. However, problems may occur, including:  A hole (perforation) in the uterus or bowel.  Infection of the uterus, bladder, or vagina.  Bleeding.  Damage to other structures or organs.  An air bubble in the lung (air embolus).  Problems with pregnancy after the procedure.  Failure of the procedure.  Decreased ability to diagnose cancer in the endometrium.  What happens before the procedure?  You will have tests of your endometrium to make sure there are no  pre-cancerous cells or cancer cells present.  You may have an ultrasound of the uterus.  You may be given medicines to thin the endometrium.  Ask your health care provider about: ? Changing or stopping your regular medicines. This is especially important if you take diabetes medicines or blood thinners. ? Taking medicines such as aspirin and ibuprofen. These medicines can thin your blood. Do not take these medicines before your procedure if your doctor tells you not to.  Plan to have someone take you home from the hospital or clinic. What happens during the procedure?  You will lie on an exam table with your feet and legs supported as in a pelvic exam.  To lower your risk of infection: ? Your health care team will wash or sanitize their hands and put on germ-free (sterile) gloves. ? Your genital area will be washed with soap.  An IV tube will be inserted into one of your veins.  You will be given a medicine to help you relax (sedative).  A surgical instrument with a light and camera (resectoscope) will be inserted into your vagina and moved into your uterus. This allows your surgeon to see inside your uterus.  Endometrial tissue will be removed using one of the following methods: ?  Radiofrequency. This method uses a radiofrequency-alternating electric current to remove the endometrium. ? Cryotherapy. This method uses extreme cold to freeze the endometrium. ? Heated-free liquid. This method uses a heated saltwater (saline) solution to remove the endometrium. ? Microwave. This method uses high-energy microwaves to heat up the endometrium and remove it. ? Thermal balloon. This method involves inserting a catheter with a balloon tip into the uterus. The balloon tip is filled with heated fluid to remove the endometrium. The procedure may vary among health care providers and hospitals. What happens after the procedure?  Your blood pressure, heart rate, breathing rate, and blood oxygen  level will be monitored until the medicines you were given have worn off.  As tissue healing occurs, you may notice vaginal bleeding for 4-6 weeks after the procedure. You may also experience: ? Cramps. ? Thin, watery vaginal discharge that is light pink or brown in color. ? A need to urinate more frequently than usual. ? Nausea.  Do not drive for 24 hours if you were given a sedative.  Do not have sex or insert anything into your vagina until your health care provider approves. Summary  Endometrial ablation is done to treat the many causes of heavy menstrual bleeding.  The procedure may be done only after medications have been tried to control the bleeding.  Plan to have someone take you home from the hospital or clinic. This information is not intended to replace advice given to you by your health care provider. Make sure you discuss any questions you have with your health care provider. Document Released: 04/03/2004 Document Revised: 06/11/2016 Document Reviewed: 06/11/2016 Elsevier Interactive Patient Education  2017 [employer].

## 2017-11-12 NOTE — Progress Notes (Signed)
Subjective:     Cassandra [name] is a 42 y.o. female and is here for a comprehensive physical exam. The patient reports problems - bleeding. Bleeding and pain x 2.5 weeks. IUD in for 2-3 years. Using Megace. Has h/o IUD use for abnl bleeding, but requires Megace. Now would like something more definitive for her bleeding.  Social History   Socioeconomic History  . Marital status: Married    Spouse name: Not on file  . Number of children: Not on file  . Years of education: Not on file  . Highest education level: Not on file  Occupational History  . Not on file  Social Needs  . Financial resource strain: Not on file  . Food insecurity:    Worry: Not on file    Inability: Not on file  . Transportation needs:    Medical: Not on file    Non-medical: Not on file  Tobacco Use  . Smoking status: Former Smoker    Types: Cigarettes    Last attempt to quit: 04/09/2015    Years since quitting: 2.5  . Smokeless tobacco: Never Used  . Tobacco comment: ~2-3  cigarettes daily  Substance and Sexual Activity  . Alcohol use: Yes    Alcohol/week: 0.0 oz    Comment: rare  . Drug use: No  . Sexual activity: Yes    Partners: Male    Birth control/protection: Surgical    Comment: tubalization  Lifestyle  . Physical activity:    Days per week: Not on file    Minutes per session: Not on file  . Stress: Not on file  Relationships  . Social connections:    Talks on phone: Not on file    Gets together: Not on file    Attends religious service: Not on file    Active member of club or organization: Not on file    Attends meetings of clubs or organizations: Not on file    Relationship status: Not on file  . Intimate partner violence:    Fear of current or ex partner: Not on file    Emotionally abused: Not on file    Physically abused: Not on file    Forced sexual activity: Not on file  Other Topics Concern  . Not on file  Social History Narrative   Lives with husband and 2 children, no  pets   Occupation: call center rep   Edu: some college   Activity: no regular exercise.  Tries to walk around building.   Diet: 1 mt dew in am, rest water, fruits/vegetables daily    Health Maintenance  Topic Date Due  . FOOT EXAM  07/15/1987  . OPHTHALMOLOGY EXAM  07/15/1987  . URINE MICROALBUMIN  07/15/1987  . HEMOGLOBIN A1C  05/03/2016  . INFLUENZA VACCINE  01/06/2018  . PAP SMEAR  06/10/2019  . PNEUMOCOCCAL POLYSACCHARIDE VACCINE (2) 01/04/2020  . DTaP/Tdap/Td (2 - Td) 12/15/2022  . TETANUS/TDAP  12/15/2022  . HIV Screening  Completed    The following portions of the patient's history were reviewed and updated as appropriate: allergies, current medications, past family history, past medical history, past social history, past surgical history and problem list.  Review of Systems Pertinent items noted in HPI and remainder of comprehensive ROS otherwise negative.   Objective:    BP (!) 154/77   Pulse 94   Ht 5\' 9"  (1.753 m)   Wt (!) 349 lb (158.3 kg)   LMP 10/29/2017   BMI 51.54 kg/m  General appearance: alert, cooperative, appears stated age and moderately obese Head: Normocephalic, without obvious abnormality, atraumatic Neck: no adenopathy, supple, symmetrical, trachea midline and thyroid not enlarged, symmetric, no tenderness/mass/nodules Lungs: clear to auscultation bilaterally Breasts: normal appearance, no masses or tenderness Heart: regular rate and rhythm, S1, S2 normal, no murmur, click, rub or gallop Abdomen: soft, non-tender; bowel sounds normal; no masses,  no organomegaly Pelvic: cervix normal in appearance, external genitalia normal, no adnexal masses or tenderness, no cervical motion tenderness, uterus normal size, shape, and consistency, vagina normal without discharge and IUD strings noted Extremities: extremities normal, atraumatic, no cyanosis or edema Pulses: 2+ and symmetric Skin: Skin color, texture, turgor normal. No rashes or lesions Lymph nodes:  Cervical, supraclavicular, and axillary nodes normal. Neurologic: Grossly normal    Assessment:    Healthy female exam.      Plan:      Problem List Items Addressed This Visit      Unprioritized   Heavy menstrual bleeding    Will book for [place] with Hysteroscopy. Discussed this option vs. Hysterectomy. Given prior c-sections, would not recommend hysterectomy as first line procedure. Risks include but are not limited to bleeding, infection, injury to surrounding structures, including bowel, bladder and ureters, blood clots, and death.  Likelihood of success is high.       Relevant Medications   megestrol (MEGACE) 40 MG tablet    Other Visit Diagnoses    Well woman exam with routine gynecological exam    -  Primary   Relevant Orders   Cytology - PAP   MM 3D SCREEN BREAST BILATERAL   Encounter for gynecological examination with abnormal finding         Return in 1 year (on 11/13/2018).  See After Visit Summary for Counseling Recommendations

## 2017-11-15 MED ORDER — MEGESTROL ACETATE 40 MG PO TABS: 40.0000 mg | ORAL_TABLET | Freq: Two times a day (BID) | ORAL | 2 refills | Status: DC | PRN

## 2017-11-15 NOTE — Assessment & Plan Note (Signed)
Will book for [facility] with Hysteroscopy. Discussed this option vs. Hysterectomy. Given prior c-sections, would not recommend hysterectomy as first line procedure. Risks include but are not limited to bleeding, infection, injury to surrounding structures, including bowel, bladder and ureters, blood clots, and death.  Likelihood of success is high.

## 2017-11-16 ENCOUNTER — Encounter: Payer: Self-pay | Admitting: Family Medicine

## 2017-11-16 LAB — CYTOLOGY - PAP
Adequacy: ABSENT
Diagnosis: NEGATIVE
HPV 16/18/45 genotyping: NEGATIVE
HPV: DETECTED — AB

## 2017-11-17 ENCOUNTER — Encounter: Payer: Self-pay | Admitting: Family Medicine

## 2017-11-17 DIAGNOSIS — R87810 Cervical high risk human papillomavirus (HPV) DNA test positive: Secondary | ICD-10-CM | POA: Insufficient documentation

## 2017-11-25 ENCOUNTER — Other Ambulatory Visit: Payer: Self-pay | Admitting: Family Medicine

## 2017-11-25 DIAGNOSIS — Z6841 Body Mass Index (BMI) 40.0 and over, adult: Principal | ICD-10-CM

## 2017-11-25 DIAGNOSIS — I10 Essential (primary) hypertension: Secondary | ICD-10-CM

## 2017-11-25 DIAGNOSIS — R739 Hyperglycemia, unspecified: Secondary | ICD-10-CM

## 2017-11-25 NOTE — Addendum Note (Signed)
Addended by: Ria Bush on: 11/25/2017 10:01 PM   Modules accepted: Orders

## 2017-11-26 ENCOUNTER — Other Ambulatory Visit (INDEPENDENT_AMBULATORY_CARE_PROVIDER_SITE_OTHER): Payer: 59

## 2017-11-26 ENCOUNTER — Ambulatory Visit
Admission: RE | Admit: 2017-11-26 | Discharge: 2017-11-26 | Disposition: A | Discharge status: Home / Self Care | Payer: 59 | Source: Ambulatory Visit | Attending: Family Medicine | Admitting: Family Medicine

## 2017-11-26 DIAGNOSIS — Z01419 Encounter for gynecological examination (general) (routine) without abnormal findings: Secondary | ICD-10-CM | POA: Insufficient documentation

## 2017-11-26 DIAGNOSIS — R739 Hyperglycemia, unspecified: Secondary | ICD-10-CM | POA: Diagnosis not present

## 2017-11-26 DIAGNOSIS — I10 Essential (primary) hypertension: Secondary | ICD-10-CM

## 2017-11-26 DIAGNOSIS — Z6841 Body Mass Index (BMI) 40.0 and over, adult: Secondary | ICD-10-CM

## 2017-11-26 LAB — BASIC METABOLIC PANEL
BUN: 9 mg/dL (ref 6–23)
CO2: 28 mEq/L (ref 19–32)
Calcium: 9 mg/dL (ref 8.4–10.5)
Chloride: 107 mEq/L (ref 96–112)
Creatinine, Ser: 1.02 mg/dL (ref 0.40–1.20)
GFR: 63.67 mL/min (ref >60.00)
Glucose, Bld: 136 mg/dL — ABNORMAL HIGH (ref 70–99)
Potassium: 4.4 mEq/L (ref 3.5–5.1)
Sodium: 141 mEq/L (ref 135–145)

## 2017-11-26 LAB — LIPID PANEL
Cholesterol: 170 mg/dL (ref 0–200)
HDL: 41.6 mg/dL (ref >39.00)
LDL Cholesterol: 108 mg/dL — ABNORMAL HIGH (ref 0–99)
NonHDL: 128.21
Total CHOL/HDL Ratio: 4
Triglycerides: 99 mg/dL (ref 0.0–149.0)
VLDL: 19.8 mg/dL (ref 0.0–40.0)

## 2017-11-26 LAB — MICROALBUMIN / CREATININE URINE RATIO
Creatinine,U: 113.3 mg/dL
Microalb Creat Ratio: 0.8 mg/g (ref 0.0–30.0)
Microalb, Ur: 0.9 mg/dL (ref 0.0–1.9)

## 2017-11-26 LAB — HEMOGLOBIN A1C: Hgb A1c MFr Bld: 6.2 % (ref 4.6–6.5)

## 2017-12-03 ENCOUNTER — Ambulatory Visit (INDEPENDENT_AMBULATORY_CARE_PROVIDER_SITE_OTHER): Payer: 59 | Admitting: Family Medicine

## 2017-12-03 ENCOUNTER — Encounter: Payer: Self-pay | Admitting: Family Medicine

## 2017-12-03 VITALS — BP 136/82 | HR 110 | Temp 98.6°F | Ht 67.5 in | Wt 342.8 lb

## 2017-12-03 DIAGNOSIS — Z0000 Encounter for general adult medical examination without abnormal findings: Secondary | ICD-10-CM

## 2017-12-03 DIAGNOSIS — F331 Major depressive disorder, recurrent, moderate: Secondary | ICD-10-CM | POA: Diagnosis not present

## 2017-12-03 DIAGNOSIS — I10 Essential (primary) hypertension: Secondary | ICD-10-CM | POA: Diagnosis not present

## 2017-12-03 DIAGNOSIS — Z6841 Body Mass Index (BMI) 40.0 and over, adult: Secondary | ICD-10-CM

## 2017-12-03 DIAGNOSIS — Z21 Asymptomatic human immunodeficiency virus [HIV] infection status: Secondary | ICD-10-CM | POA: Diagnosis not present

## 2017-12-03 MED ORDER — HYDROCHLOROTHIAZIDE 25 MG PO TABS: 25.0000 mg | ORAL_TABLET | Freq: Every day | ORAL | 3 refills | Status: DC

## 2017-12-03 MED ORDER — SERTRALINE HCL 100 MG PO TABS
100.0000 mg | ORAL_TABLET | Freq: Every day | ORAL | 6 refills | Status: DC
Start: 2017-12-03 — End: 2018-01-01

## 2017-12-03 MED ORDER — AMLODIPINE BESYLATE 5 MG PO TABS: 5.0000 mg | ORAL_TABLET | Freq: Every day | ORAL | 3 refills | Status: DC

## 2017-12-03 NOTE — Assessment & Plan Note (Addendum)
zoloft longterm prescription through ID. Ongoing trouble with mood. Will trial higher dose 100mg  daily.  Interested in seeing counselor - referral placed.

## 2017-12-03 NOTE — Progress Notes (Signed)
BP 136/82 (BP Location: Left Arm, Patient Position: Sitting, Cuff Size: Normal) Comment (Cuff Size): Used on lower LE  Pulse (!) 110   Temp 98.6 F (37 C) (Oral)   Ht 5' 7.5" (1.715 m)   Wt (!) 342 lb 12 oz (155.5 kg)   LMP 10/30/2017   SpO2 95%   BMI 52.89 kg/m    CC: CPE Subjective:    Patient ID: Cassandra [name], female    DOB: 1976-04-07, 42 y.o.   MRN: [id number]  HPI: Cassandra [name] is a 42 y.o. female presenting on 12/03/2017 for Annual Exam (Pt requests new rx for Flexeril.)   Weight gain since last visit seen here - however last lost 8 lbs in last month. Pt has been more active with son - he joined boy scouts. She has also decreased Mt Dew and increased water intake.   MDD - ongoing trouble with depressed mood - requests higher sertraline dose. Family grounds her.   Cholecystectomy last year after gallstones.  HIV - sees Dr Ola Spurr yearly, on biktarvy. Wonderful control.   Preventative: Well woman with Dr. Kennon Rounds - 11/2017. Normal pap smears. Pending uterine ablation for prolonged bleed. Currently on megace.  Mammogram 11/2017 Birads1 Flu yearly  Tdap 12/2012 Pneumovax 2016 Seat belt use discussed  Sunscreen use discussed, no changing moles on skin.  Ex smoker - 2017 Alcohol - rare Dentist >1 yr since seen Eye exam - yearly  Lives with husband and 2 children, no pets Occupation: call center rep Edu: some college Activity: Tries to walk around building.  Diet: 1 mt dew in am, rest water, fruits/vegetables daily, bringing food from home   Relevant past medical, surgical, family and social history reviewed and updated as indicated. Interim medical history since our last visit reviewed. Allergies and medications reviewed and updated. Outpatient Medications Prior to Visit  Medication Sig Dispense Refill  . BIKTARVY 50-200-25 MG TABS tablet 1 tablet at bedtime.    . Calcium 200 MG TABS Take by mouth.    . Calcium Carb-Cholecalciferol (CALCIUM-VITAMIN D)  600-400 MG-UNIT TABS Take 1 tablet by mouth daily. 60 tablet   . Fexofenadine HCl (ALLEGRA PO) Take by mouth daily as needed.     . Loratadine (CLARITIN PO) Take by mouth daily as needed.     . megestrol (MEGACE) 40 MG tablet Take 1 tablet (40 mg total) by mouth 2 (two) times daily as needed. 60 tablet 2  . omeprazole (PRILOSEC) 40 MG capsule TAKE 1 CAPSULE BY MOUTH EVERY DAY 30 capsule 3  . amLODipine (NORVASC) 5 MG tablet TAKE 1 TABLET DAILY 90 tablet 3  . hydrochlorothiazide (HYDRODIURIL) 25 MG tablet TAKE 1 TABLET DAILY 90 tablet 0  . sertraline (ZOLOFT) 50 MG tablet Take 50 mg by mouth daily.    . diclofenac (VOLTAREN) 75 MG EC tablet diclofenac sodium 75 mg tablet,delayed release  TAKE 1 TABLET (75 MG TOTAL) BY MOUTH 2 (TWO) TIMES DAILY WITH A MEAL.    . Multiple Vitamins-Minerals (MULTIVITAMIN PO) Take 1 tablet by mouth daily.     No facility-administered medications prior to visit.      Per HPI unless specifically indicated in ROS section below Review of Systems  Constitutional: Negative for activity change, appetite change, chills, fatigue, fever and unexpected weight change.  HENT: Negative for hearing loss.   Eyes: Negative for visual disturbance.  Respiratory: Negative for cough, chest tightness, shortness of breath and wheezing.   Cardiovascular: Positive for leg swelling (at ankles).  Negative for chest pain and palpitations.  Gastrointestinal: Positive for abdominal pain (cramping) and constipation. Negative for abdominal distention, blood in stool, diarrhea, nausea and vomiting.  Genitourinary: Negative for difficulty urinating and hematuria.  Musculoskeletal: Negative for arthralgias, myalgias and neck pain.  Skin: Negative for rash.  Neurological: Positive for headaches. Negative for dizziness, seizures and syncope.  Hematological: Negative for adenopathy. Does not bruise/bleed easily.  Psychiatric/Behavioral: Positive for dysphoric mood (zoloft helps). The patient is not  nervous/anxious.        Objective:    BP 136/82 (BP Location: Left Arm, Patient Position: Sitting, Cuff Size: Normal) Comment (Cuff Size): Used on lower LE  Pulse (!) 110   Temp 98.6 F (37 C) (Oral)   Ht 5' 7.5" (1.715 m)   Wt (!) 342 lb 12 oz (155.5 kg)   LMP 10/30/2017   SpO2 95%   BMI 52.89 kg/m   Wt Readings from Last 3 Encounters:  12/03/17 (!) 342 lb 12 oz (155.5 kg)  11/12/17 (!) 349 lb (158.3 kg)  10/08/17 (!) 343 lb (155.6 kg)    Physical Exam  Constitutional: She is oriented to person, place, and time. She appears well-developed and well-nourished. No distress.  HENT:  Head: Normocephalic and atraumatic.  Right Ear: Hearing, tympanic membrane, external ear and ear canal normal.  Left Ear: Hearing, tympanic membrane, external ear and ear canal normal.  Nose: Nose normal.  Mouth/Throat: Uvula is midline, oropharynx is clear and moist and mucous membranes are normal. No oropharyngeal exudate, posterior oropharyngeal edema or posterior oropharyngeal erythema.  Eyes: Pupils are equal, round, and reactive to light. Conjunctivae and EOM are normal. No scleral icterus.  Neck: Normal range of motion. Neck supple. No thyromegaly present.  Cardiovascular: Normal rate, regular rhythm, normal heart sounds and intact distal pulses.  No murmur heard. Pulses:      Radial pulses are 2+ on the right side, and 2+ on the left side.  Pulmonary/Chest: Effort normal and breath sounds normal. No respiratory distress. She has no wheezes. She has no rales.  Abdominal: Soft. Bowel sounds are normal. She exhibits no distension and no mass. There is no tenderness. There is no rebound and no guarding.  Musculoskeletal: Normal range of motion. She exhibits no edema.  Lymphadenopathy:    She has no cervical adenopathy.  Neurological: She is alert and oriented to person, place, and time.  CN grossly intact, station and gait intact  Skin: Skin is warm and dry. No rash noted.  Psychiatric: She has  a normal mood and affect. Her behavior is normal. Judgment and thought content normal.  Nursing note and vitals reviewed.  Results for orders placed or performed in visit on 11/26/17  Microalbumin / creatinine urine ratio  Result Value Ref Range   Microalb, Ur 0.9 0.0 - 1.9 mg/dL   Creatinine,U 113.3 mg/dL   Microalb Creat Ratio 0.8 0.0 - 30.0 mg/g  Hemoglobin A1c  Result Value Ref Range   Hgb A1c MFr Bld 6.2 4.6 - 6.5 %  Basic metabolic panel  Result Value Ref Range   Sodium 141 135 - 145 mEq/L   Potassium 4.4 3.5 - 5.1 mEq/L   Chloride 107 96 - 112 mEq/L   CO2 28 19 - 32 mEq/L   Glucose, Bld 136 (H) 70 - 99 mg/dL   BUN 9 6 - 23 mg/dL   Creatinine, Ser 1.02 0.40 - 1.20 mg/dL   Calcium 9.0 8.4 - 10.5 mg/dL   GFR 63.67 >60.00 mL/min  Lipid panel  Result Value Ref Range   Cholesterol 170 0 - 200 mg/dL   Triglycerides 99.0 0.0 - 149.0 mg/dL   HDL 41.60 >39.00 mg/dL   VLDL 19.8 0.0 - 40.0 mg/dL   LDL Cholesterol 108 (H) 0 - 99 mg/dL   Total CHOL/HDL Ratio 4    NonHDL 128.21    Depression screen [facility] 2/9 12/03/2017 12/03/2017 12/03/2017  Decreased Interest 1 1 0  Down, Depressed, Hopeless 1 1 -  PHQ - 2 Score 2 2 0  Altered sleeping 3 3 -  Tired, decreased energy 3 3 -  Change in appetite 3 3 -  Feeling bad or failure about yourself  2 2 -  Trouble concentrating 2 2 -  Moving slowly or fidgety/restless 1 1 -  Suicidal thoughts 0 0 -  PHQ-9 Score 16 16 -    GAD 7 : Generalized Anxiety Score 12/03/2017  Nervous, Anxious, on Edge 2  Control/stop worrying 1  Worry too much - different things 2  Trouble relaxing 3  Restless 1  Easily annoyed or irritable 2  Afraid - awful might happen 2  Total GAD 7 Score 13        Assessment & Plan:   Problem List Items Addressed This Visit    Morbid obesity with BMI of 50.0-59.9, adult ([place])    Encouraged healthy diet and lifestyle changes to affect sustainable weight loss. She may consider regular walking routing at work trail, at  [place].      MDD (major depressive disorder), recurrent episode, moderate ([place])    zoloft longterm prescription through ID. Ongoing trouble with mood. Will trial higher dose 100mg  daily.  Interested in seeing counselor - referral placed.       Relevant Medications   sertraline (ZOLOFT) 100 MG tablet   Other Relevant Orders   Ambulatory referral to Psychology   Hypertension    Chronic, stable. Continue current regimen. Refilled meds.       Relevant Medications   hydrochlorothiazide (HYDRODIURIL) 25 MG tablet   amLODipine (NORVASC) 5 MG tablet   HIV (human immunodeficiency virus infection) ([place])    Appreciate ID care. Now on biktarvy and tolerating very well.       Health maintenance examination - Primary    Preventative protocols reviewed and updated unless pt declined. Discussed healthy diet and lifestyle.           Meds ordered this encounter  Medications  . sertraline (ZOLOFT) 100 MG tablet    Sig: Take 1 tablet (100 mg total) by mouth daily.    Dispense:  30 tablet    Refill:  6    Note new sig  . hydrochlorothiazide (HYDRODIURIL) 25 MG tablet    Sig: Take 1 tablet (25 mg total) by mouth daily.    Dispense:  90 tablet    Refill:  3  . amLODipine (NORVASC) 5 MG tablet    Sig: Take 1 tablet (5 mg total) by mouth daily.    Dispense:  90 tablet    Refill:  3   Orders Placed This Encounter  Procedures  . Ambulatory referral to Psychology    Referral Priority:   Routine    Referral Type:   Psychiatric    Referral Reason:   Specialty Services Required    Requested Specialty:   Psychology    Number of Visits Requested:   1    Follow up plan: Return in about 1 year (around 12/04/2018) for annual exam,  prior fasting for blood work.  Ria Bush, MD

## 2017-12-03 NOTE — Assessment & Plan Note (Signed)
Chronic, stable. Continue current regimen. Refilled meds.

## 2017-12-03 NOTE — Patient Instructions (Addendum)
Increase sertraline to 193m daily - sent to local pharmacy.  We will refer you to one of our counselors.  Work on low carb low sugar diet to control sugar levels.  Work on regular exercise routine. Health Maintenance, Female Adopting a healthy lifestyle and getting preventive care can go a long way to promote health and wellness. Talk with your health care provider about what schedule of regular examinations is right for you. This is a good chance for you to check in with your provider about disease prevention and staying healthy. In between checkups, there are plenty of things you can do on your own. Experts have done a lot of research about which lifestyle changes and preventive measures are most likely to keep you healthy. Ask your health care provider for more information. Weight and diet Eat a healthy diet  Be sure to include plenty of vegetables, fruits, low-fat dairy products, and lean protein.  Do not eat a lot of foods high in solid fats, added sugars, or salt.  Get regular exercise. This is one of the most important things you can do for your health. ? Most adults should exercise for at least 150 minutes each week. The exercise should increase your heart rate and make you sweat (moderate-intensity exercise). ? Most adults should also do strengthening exercises at least twice a week. This is in addition to the moderate-intensity exercise.  Maintain a healthy weight  Body mass index (BMI) is a measurement that can be used to identify possible weight problems. It estimates body fat based on height and weight. Your health care provider can help determine your BMI and help you achieve or maintain a healthy weight.  For females 233years of age and older: ? A BMI below 18.5 is considered underweight. ? A BMI of 18.5 to 24.9 is normal. ? A BMI of 25 to 29.9 is considered overweight. ? A BMI of 30 and above is considered obese.  Watch levels of cholesterol and blood lipids  You should  start having your blood tested for lipids and cholesterol at 40years of age, then have this test every 5 years.  You may need to have your cholesterol levels checked more often if: ? Your lipid or cholesterol levels are high. ? You are older than 40years of age. ? You are at high risk for heart disease.  Cancer screening Lung Cancer  Lung cancer screening is recommended for adults 562861years old who are at high risk for lung cancer because of a history of smoking.  A yearly low-dose CT scan of the lungs is recommended for people who: ? Currently smoke. ? Have quit within the past 15 years. ? Have at least a 30-pack-year history of smoking. A pack year is smoking an average of one pack of cigarettes a day for 1 year.  Yearly screening should continue until it has been 15 years since you quit.  Yearly screening should stop if you develop a health problem that would prevent you from having lung cancer treatment.  Breast Cancer  Practice breast self-awareness. This means understanding how your breasts normally appear and feel.  It also means doing regular breast self-exams. Let your health care provider know about any changes, no matter how small.  If you are in your 20s or 30s, you should have a clinical breast exam (CBE) by a health care provider every 1-3 years as part of a regular health exam.  If you are 41or older, have a  CBE every year. Also consider having a breast X-ray (mammogram) every year.  If you have a family history of breast cancer, talk to your health care provider about genetic screening.  If you are at high risk for breast cancer, talk to your health care provider about having an MRI and a mammogram every year.  Breast cancer gene (BRCA) assessment is recommended for women who have family members with BRCA-related cancers. BRCA-related cancers include: ? Breast. ? Ovarian. ? Tubal. ? Peritoneal cancers.  Results of the assessment will determine the need  for genetic counseling and BRCA1 and BRCA2 testing.  Cervical Cancer Your health care provider may recommend that you be screened regularly for cancer of the pelvic organs (ovaries, uterus, and vagina). This screening involves a pelvic examination, including checking for microscopic changes to the surface of your cervix (Pap test). You may be encouraged to have this screening done every 3 years, beginning at age 61.  For women ages 91-65, health care providers may recommend pelvic exams and Pap testing every 3 years, or they may recommend the Pap and pelvic exam, combined with testing for human papilloma virus (HPV), every 5 years. Some types of HPV increase your risk of cervical cancer. Testing for HPV may also be done on women of any age with unclear Pap test results.  Other health care providers may not recommend any screening for nonpregnant women who are considered low risk for pelvic cancer and who do not have symptoms. Ask your health care provider if a screening pelvic exam is right for you.  If you have had past treatment for cervical cancer or a condition that could lead to cancer, you need Pap tests and screening for cancer for at least 20 years after your treatment. If Pap tests have been discontinued, your risk factors (such as having a new sexual partner) need to be reassessed to determine if screening should resume. Some women have medical problems that increase the chance of getting cervical cancer. In these cases, your health care provider may recommend more frequent screening and Pap tests.  Colorectal Cancer  This type of cancer can be detected and often prevented.  Routine colorectal cancer screening usually begins at 40 years of age and continues through 40 years of age.  Your health care provider may recommend screening at an earlier age if you have risk factors for colon cancer.  Your health care provider may also recommend using home test kits to check for hidden blood in  the stool.  A small camera at the end of a tube can be used to examine your colon directly (sigmoidoscopy or colonoscopy). This is done to check for the earliest forms of colorectal cancer.  Routine screening usually begins at age 4.  Direct examination of the colon should be repeated every 5-10 years through 40 years of age. However, you may need to be screened more often if early forms of precancerous polyps or small growths are found.  Skin Cancer  Check your skin from head to toe regularly.  Tell your health care provider about any new moles or changes in moles, especially if there is a change in a mole's shape or color.  Also tell your health care provider if you have a mole that is larger than the size of a pencil eraser.  Always use sunscreen. Apply sunscreen liberally and repeatedly throughout the day.  Protect yourself by wearing long sleeves, pants, a wide-brimmed hat, and sunglasses whenever you are outside.  Heart disease,  diabetes, and high blood pressure  High blood pressure causes heart disease and increases the risk of stroke. High blood pressure is more likely to develop in: ? People who have blood pressure in the high end of the normal range (130-139/85-89 mm Hg). ? People who are overweight or obese. ? People who are African American.  If you are 33-29 years of age, have your blood pressure checked every 3-5 years. If you are 64 years of age or older, have your blood pressure checked every year. You should have your blood pressure measured twice-once when you are at a hospital or clinic, and once when you are not at a hospital or clinic. Record the average of the two measurements. To check your blood pressure when you are not at a hospital or clinic, you can use: ? An automated blood pressure machine at a pharmacy. ? A home blood pressure monitor.  If you are between 69 years and 80 years old, ask your health care provider if you should take aspirin to prevent  strokes.  Have regular diabetes screenings. This involves taking a blood sample to check your fasting blood sugar level. ? If you are at a normal weight and have a low risk for diabetes, have this test once every three years after 40 years of age. ? If you are overweight and have a high risk for diabetes, consider being tested at a younger age or more often. Preventing infection Hepatitis B  If you have a higher risk for hepatitis B, you should be screened for this virus. You are considered at high risk for hepatitis B if: ? You were born in a country where hepatitis B is common. Ask your health care provider which countries are considered high risk. ? Your parents were born in a high-risk country, and you have not been immunized against hepatitis B (hepatitis B vaccine). ? You have HIV or AIDS. ? You use needles to inject street drugs. ? You live with someone who has hepatitis B. ? You have had sex with someone who has hepatitis B. ? You get hemodialysis treatment. ? You take certain medicines for conditions, including cancer, organ transplantation, and autoimmune conditions.  Hepatitis C  Blood testing is recommended for: ? Everyone born from [birth year] through 1963. ? Anyone with known risk factors for hepatitis C.  Sexually transmitted infections (STIs)  You should be screened for sexually transmitted infections (STIs) including gonorrhea and chlamydia if: ? You are sexually active and are younger than 40 years of age. ? You are older than 40 years of age and your health care provider tells you that you are at risk for this type of infection. ? Your sexual activity has changed since you were last screened and you are at an increased risk for chlamydia or gonorrhea. Ask your health care provider if you are at risk.  If you do not have HIV, but are at risk, it may be recommended that you take a prescription medicine daily to prevent HIV infection. This is called pre-exposure prophylaxis  (PrEP). You are considered at risk if: ? You are sexually active and do not regularly use condoms or know the HIV status of your partner(s). ? You take drugs by injection. ? You are sexually active with a partner who has HIV.  Talk with your health care provider about whether you are at high risk of being infected with HIV. If you choose to begin PrEP, you should first be tested for HIV. You should  then be tested every 3 months for as long as you are taking PrEP. Pregnancy  If you are premenopausal and you may become pregnant, ask your health care provider about preconception counseling.  If you may become pregnant, take 400 to 800 micrograms (mcg) of folic acid every day.  If you want to prevent pregnancy, talk to your health care provider about birth control (contraception). Osteoporosis and menopause  Osteoporosis is a disease in which the bones lose minerals and strength with aging. This can result in serious bone fractures. Your risk for osteoporosis can be identified using a bone density scan.  If you are 40 years of age or older, or if you are at risk for osteoporosis and fractures, ask your health care provider if you should be screened.  Ask your health care provider whether you should take a calcium or vitamin D supplement to lower your risk for osteoporosis.  Menopause may have certain physical symptoms and risks.  Hormone replacement therapy may reduce some of these symptoms and risks. Talk to your health care provider about whether hormone replacement therapy is right for you. Follow these instructions at home:  Schedule regular health, dental, and eye exams.  Stay current with your immunizations.  Do not use any tobacco products including cigarettes, chewing tobacco, or electronic cigarettes.  If you are pregnant, do not drink alcohol.  If you are breastfeeding, limit how much and how often you drink alcohol.  Limit alcohol intake to no more than 1 drink per day for  nonpregnant women. One drink equals 12 ounces of beer, 5 ounces of wine, or 1 ounces of hard liquor.  Do not use street drugs.  Do not share needles.  Ask your health care provider for help if you need support or information about quitting drugs.  Tell your health care provider if you often feel depressed.  Tell your health care provider if you have ever been abused or do not feel safe at home. This information is not intended to replace advice given to you by your health care provider. Make sure you discuss any questions you have with your health care provider. Document Released: 12/08/2010 Document Revised: 10/31/2015 Document Reviewed: 02/26/2015 Elsevier Interactive Patient Education  [employer].

## 2017-12-03 NOTE — Assessment & Plan Note (Signed)
Preventative protocols reviewed and updated unless pt declined. Discussed healthy diet and lifestyle.  

## 2017-12-03 NOTE — Assessment & Plan Note (Signed)
Appreciate ID care. Now on biktarvy and tolerating very well.

## 2017-12-03 NOTE — Assessment & Plan Note (Signed)
Encouraged healthy diet and lifestyle changes to affect sustainable weight loss. She may consider regular walking routing at work trail, at [employer].

## 2017-12-15 NOTE — Patient Instructions (Addendum)
Your procedure is scheduled on: Wednesday, July 31  Enter through the [employer] [facility] at: 12:30 pm  Pick up the phone at the desk and dial [phone].  Call this number if you have problems the morning of surgery: [phone].  Remember: Do NOT eat food after midnight Tuesday  Do NOT drink clear liquids (including water) after 8 am Wednesday, day of surgery.  Take these medicines the morning of surgery with a SIP OF WATER:  Claritin if needed  Brush your teeth on the morning of surgery.  Do Not smoke on the day of surgery.  Stop herbal medications, vitamin supplements, Ibuprofen/NSAIDS 1 week prior to surgery.  Do NOT wear jewelry (body piercing), metal hair clips/bobby pins, make-up, or nail polish. Do NOT wear lotions, powders, or perfumes.  You may wear deoderant. Do NOT shave for 48 hours prior to surgery. Do NOT bring valuables to the hospital.  Have a responsible adult drive you home and stay with you for 24 hours after your procedure.  Home with Husband Laveda Abbe cell [phone]

## 2017-12-16 NOTE — H&P (Signed)
  Cassandra [name] is an 42 y.o. [phone] female.   Chief Complaint: abnormal bleeding HPI:  Previous C-section x 2. On IUD for bleeding, but requires Megace to control. Desires more definitive surgery.  Past Medical History:  Diagnosis Date  . 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 Diabetes in pregnancy 02/27/2014  . Gallstones 06/2016   by xray  . Generalized headaches    frequent  . History of abnormal Pap smear    remote  . History of gestational diabetes    first 2 pregnancies  . 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[place])   . HSV-2 seropositive   . Morbid obesity ([place])   . Periodontal disease 08/2011   currently getting dental work  . Tobacco use     Past Surgical History:  Procedure Laterality Date  . CESAREAN SECTION  [phone]   x2  . CHOLECYSTECTOMY  2018  . INTRAUTERINE DEVICE INSERTION     Mirena  . WISDOM TOOTH EXTRACTION     x 4    Family History  Problem Relation Age of Onset  . Hypertension Mother   . Hypertension Father   . Diabetes Maternal Grandmother        s/p amputation  . Hyperlipidemia Maternal Grandmother   . Hypertension Maternal Grandmother   . Stroke Maternal Grandmother   . Cancer Maternal Grandfather        skin cancer  . Hyperlipidemia Maternal Grandfather   . Hypertension Maternal Grandfather   . Seizures Paternal Grandmother 37       deceased  . Coronary artery disease Paternal Grandfather 12       MI  . Breast cancer Neg Hx    Social History:  reports that she quit smoking about 2 years ago. Her smoking use included cigarettes. She has never used smokeless tobacco. She reports that she drinks alcohol. She reports that she does not use drugs.  Allergies: No Known Allergies  No medications prior to admission.    A comprehensive review of systems was negative.  There were no vitals taken for this visit. General appearance: alert, cooperative and appears stated age Head: Normocephalic, without obvious abnormality, atraumatic Neck: supple,  symmetrical, trachea midline Lungs: normal effort Heart: regular rate and rhythm Abdomen: soft, non-tender; bowel sounds normal; no masses,  no organomegaly Extremities: extremities normal, atraumatic, no cyanosis or edema Skin: Skin color, texture, turgor normal. No rashes or lesions Neurologic: Grossly normal   Lab Results  Component Value Date   WBC 8.0 09/10/2016   HGB 13.8 09/10/2016   HCT 40.4 09/10/2016   MCV 97.0 09/10/2016   PLT 337.0 09/10/2016   Lab Results  Component Value Date   PREGTESTUR Negative 03/28/2013     Assessment/Plan Active Problems:   Heavy menstrual bleeding  for D & C with hysteroscopy, IUD removal and endometrial ablation.  Risks include but are not limited to bleeding, infection, injury to surrounding structures, including bowel, bladder and ureters, blood clots, and death.  Likelihood of success is high.    Cassandra [name] 12/16/2017, 9:44 AM

## 2017-12-24 ENCOUNTER — Encounter (HOSPITAL_COMMUNITY): Payer: Self-pay

## 2017-12-24 ENCOUNTER — Encounter (HOSPITAL_COMMUNITY)
Admission: RE | Admit: 2017-12-24 | Discharge: 2017-12-24 | Disposition: A | Discharge status: Home / Self Care | Payer: 59 | Source: Ambulatory Visit | Attending: Family Medicine | Admitting: Family Medicine

## 2017-12-24 ENCOUNTER — Other Ambulatory Visit: Payer: Self-pay

## 2017-12-24 DIAGNOSIS — Z01810 Encounter for preprocedural cardiovascular examination: Secondary | ICD-10-CM | POA: Diagnosis present

## 2017-12-24 DIAGNOSIS — Z01812 Encounter for preprocedural laboratory examination: Secondary | ICD-10-CM | POA: Diagnosis not present

## 2017-12-24 HISTORY — DX: Essential (primary) hypertension: I10

## 2017-12-24 HISTORY — DX: Depression, unspecified: F32.A

## 2017-12-24 HISTORY — DX: Gastro-esophageal reflux disease without esophagitis: K21.9

## 2017-12-24 HISTORY — DX: Major depressive disorder, single episode, unspecified: F32.9

## 2017-12-24 HISTORY — DX: Anxiety disorder, unspecified: F41.9

## 2017-12-24 LAB — BASIC METABOLIC PANEL
Anion gap: 12 (ref 5–15)
BUN: 10 mg/dL (ref 6–20)
CO2: 25 mmol/L (ref 22–32)
Calcium: 9.8 mg/dL (ref 8.9–10.3)
Chloride: 100 mmol/L (ref 98–111)
Creatinine, Ser: 1.12 mg/dL — ABNORMAL HIGH (ref 0.44–1.00)
GFR calc Af Amer: >60 mL/min (ref >60)
GFR calc non Af Amer: >60 mL/min (ref >60)
Glucose, Bld: 140 mg/dL — ABNORMAL HIGH (ref 70–99)
Potassium: 3.4 mmol/L — ABNORMAL LOW (ref 3.5–5.1)
Sodium: 137 mmol/L (ref 135–145)

## 2017-12-24 LAB — CBC
HCT: 42.1 % (ref 36.0–46.0)
Hemoglobin: 14.2 g/dL (ref 12.0–15.0)
MCH: 32.3 pg (ref 26.0–34.0)
MCHC: 33.7 g/dL (ref 30.0–36.0)
MCV: 95.9 fL (ref 78.0–100.0)
Platelets: 299 10*3/uL (ref 150–400)
RBC: 4.39 MIL/uL (ref 3.87–5.11)
RDW: 11.9 % (ref 11.5–15.5)
WBC: 7.5 10*3/uL (ref 4.0–10.5)

## 2017-12-29 ENCOUNTER — Telehealth: Payer: Self-pay | Admitting: Family Medicine

## 2017-12-29 NOTE — Telephone Encounter (Signed)
[employer] faxed paperwork needing additional information Pt aware dr Silvio Pate out of office  Paperwork in dr letvak's in box I have attached in folder original fmla and [employer] accommodation paperwork if you need to refer back to

## 2017-12-31 ENCOUNTER — Ambulatory Visit: Payer: PRIVATE HEALTH INSURANCE | Admitting: Psychology

## 2018-01-01 ENCOUNTER — Other Ambulatory Visit: Payer: Self-pay | Admitting: Family Medicine

## 2018-01-03 NOTE — Telephone Encounter (Signed)
I answered the 2 extra questions. No additional charge

## 2018-01-04 NOTE — Telephone Encounter (Signed)
Paperwork faxed Copy for scann

## 2018-01-05 ENCOUNTER — Ambulatory Visit (HOSPITAL_COMMUNITY): Payer: 59 | Admitting: Anesthesiology

## 2018-01-05 ENCOUNTER — Encounter (HOSPITAL_COMMUNITY): Payer: Self-pay

## 2018-01-05 ENCOUNTER — Ambulatory Visit (HOSPITAL_COMMUNITY)
Admission: AD | Admit: 2018-01-05 | Discharge: 2018-01-05 | Disposition: A | Discharge status: Home / Self Care | Payer: 59 | Source: Ambulatory Visit | Attending: Family Medicine | Admitting: Family Medicine

## 2018-01-05 ENCOUNTER — Encounter (HOSPITAL_COMMUNITY)
Admission: AD | Disposition: A | Discharge status: Home / Self Care | Payer: Self-pay | Source: Ambulatory Visit | Attending: Family Medicine

## 2018-01-05 ENCOUNTER — Other Ambulatory Visit: Payer: Self-pay

## 2018-01-05 DIAGNOSIS — Z87891 Personal history of nicotine dependence: Secondary | ICD-10-CM | POA: Insufficient documentation

## 2018-01-05 DIAGNOSIS — Z79899 Other long term (current) drug therapy: Secondary | ICD-10-CM | POA: Diagnosis not present

## 2018-01-05 DIAGNOSIS — N870 Mild cervical dysplasia: Secondary | ICD-10-CM | POA: Diagnosis present

## 2018-01-05 DIAGNOSIS — Z6841 Body Mass Index (BMI) 40.0 and over, adult: Secondary | ICD-10-CM | POA: Insufficient documentation

## 2018-01-05 DIAGNOSIS — Z21 Asymptomatic human immunodeficiency virus [HIV] infection status: Secondary | ICD-10-CM

## 2018-01-05 DIAGNOSIS — N920 Excessive and frequent menstruation with regular cycle: Secondary | ICD-10-CM | POA: Insufficient documentation

## 2018-01-05 HISTORY — PX: HYSTEROSCOPY WITH D & C: SHX1775

## 2018-01-05 LAB — PREGNANCY, URINE: Preg Test, Ur: NEGATIVE

## 2018-01-05 SURGERY — DILATATION AND CURETTAGE /HYSTEROSCOPY
Anesthesia: General | Site: Vagina

## 2018-01-05 MED ORDER — FENTANYL CITRATE (PF) 100 MCG/2ML IJ SOLN
INTRAMUSCULAR | Status: DC | PRN
  Administered 2018-01-05 (×2): 50 ug via INTRAVENOUS

## 2018-01-05 MED ORDER — SUCCINYLCHOLINE CHLORIDE 200 MG/10ML IV SOSY
PREFILLED_SYRINGE | INTRAVENOUS | Status: AC
  Filled 2018-01-05: qty 10

## 2018-01-05 MED ORDER — FENTANYL CITRATE (PF) 100 MCG/2ML IJ SOLN: 25.0000 ug | INTRAMUSCULAR | Status: DC | PRN

## 2018-01-05 MED ORDER — OXYCODONE HCL 5 MG PO TABS
ORAL_TABLET | ORAL | Status: AC
Start: 2018-01-05 — End: 2018-01-05
  Administered 2018-01-05: 5 mg via ORAL
  Filled 2018-01-05: qty 1

## 2018-01-05 MED ORDER — BUPIVACAINE HCL (PF) 0.25 % IJ SOLN
INTRAMUSCULAR | Status: AC
  Filled 2018-01-05: qty 30

## 2018-01-05 MED ORDER — LIDOCAINE HCL (CARDIAC) PF 100 MG/5ML IV SOSY
PREFILLED_SYRINGE | INTRAVENOUS | Status: AC
  Filled 2018-01-05: qty 5

## 2018-01-05 MED ORDER — DEXAMETHASONE SODIUM PHOSPHATE 10 MG/ML IJ SOLN
INTRAMUSCULAR | Status: DC | PRN
  Administered 2018-01-05: 4 mg via INTRAVENOUS

## 2018-01-05 MED ORDER — KETOROLAC TROMETHAMINE 30 MG/ML IJ SOLN
INTRAMUSCULAR | Status: AC
  Filled 2018-01-05: qty 1

## 2018-01-05 MED ORDER — PROPOFOL 10 MG/ML IV BOLUS
INTRAVENOUS | Status: DC | PRN
  Administered 2018-01-05: 200 mg via INTRAVENOUS

## 2018-01-05 MED ORDER — DEXAMETHASONE SODIUM PHOSPHATE 4 MG/ML IJ SOLN
INTRAMUSCULAR | Status: AC
  Filled 2018-01-05: qty 1

## 2018-01-05 MED ORDER — SUCCINYLCHOLINE CHLORIDE 20 MG/ML IJ SOLN
INTRAMUSCULAR | Status: DC | PRN
  Administered 2018-01-05: 160 mg via INTRAVENOUS

## 2018-01-05 MED ORDER — LACTATED RINGERS IV SOLN
INTRAVENOUS | Status: DC
  Administered 2018-01-05: 125 mL/h via INTRAVENOUS

## 2018-01-05 MED ORDER — MIDAZOLAM HCL 2 MG/2ML IJ SOLN
INTRAMUSCULAR | Status: DC | PRN
  Administered 2018-01-05: 2 mg via INTRAVENOUS

## 2018-01-05 MED ORDER — PROMETHAZINE HCL 25 MG/ML IJ SOLN: 6.2500 mg | INTRAMUSCULAR | Status: DC | PRN

## 2018-01-05 MED ORDER — PROPOFOL 10 MG/ML IV BOLUS
INTRAVENOUS | Status: AC
Start: 2018-01-05 — End: ?
  Filled 2018-01-05: qty 20

## 2018-01-05 MED ORDER — MIDAZOLAM HCL 2 MG/2ML IJ SOLN
INTRAMUSCULAR | Status: AC
  Filled 2018-01-05: qty 2

## 2018-01-05 MED ORDER — OXYCODONE HCL 5 MG PO TABS
5.0000 mg | ORAL_TABLET | Freq: Once | ORAL | Status: AC | PRN
  Administered 2018-01-05: 5 mg via ORAL

## 2018-01-05 MED ORDER — OXYCODONE HCL 5 MG/5ML PO SOLN: 5.0000 mg | Freq: Once | ORAL | Status: AC | PRN

## 2018-01-05 MED ORDER — SODIUM CHLORIDE 0.9 % IR SOLN
Status: DC | PRN
  Administered 2018-01-05: 3000 mL

## 2018-01-05 MED ORDER — FENTANYL CITRATE (PF) 100 MCG/2ML IJ SOLN
INTRAMUSCULAR | Status: AC
  Filled 2018-01-05: qty 2

## 2018-01-05 MED ORDER — KETOROLAC TROMETHAMINE 30 MG/ML IJ SOLN
INTRAMUSCULAR | Status: DC | PRN
  Administered 2018-01-05: 30 mg via INTRAVENOUS

## 2018-01-05 MED ORDER — ONDANSETRON HCL 4 MG/2ML IJ SOLN
INTRAMUSCULAR | Status: DC | PRN
  Administered 2018-01-05: 4 mg via INTRAVENOUS

## 2018-01-05 MED ORDER — ONDANSETRON HCL 4 MG/2ML IJ SOLN
INTRAMUSCULAR | Status: AC
  Filled 2018-01-05: qty 2

## 2018-01-05 MED ORDER — LIDOCAINE HCL (CARDIAC) PF 100 MG/5ML IV SOSY
PREFILLED_SYRINGE | INTRAVENOUS | Status: DC | PRN
  Administered 2018-01-05: 80 mg via INTRAVENOUS

## 2018-01-05 MED ORDER — SCOPOLAMINE 1 MG/3DAYS TD PT72
1.0000 | MEDICATED_PATCH | Freq: Once | TRANSDERMAL | Status: DC
  Administered 2018-01-05: 1.5 mg via TRANSDERMAL

## 2018-01-05 MED ORDER — SCOPOLAMINE 1 MG/3DAYS TD PT72
MEDICATED_PATCH | TRANSDERMAL | Status: AC
  Administered 2018-01-05: 1.5 mg via TRANSDERMAL
  Filled 2018-01-05: qty 1

## 2018-01-05 MED ORDER — OXYCODONE-ACETAMINOPHEN 5-325 MG PO TABS
1.0000 | ORAL_TABLET | Freq: Four times a day (QID) | ORAL | 0 refills | Status: DC | PRN
Start: 2018-01-05 — End: 2018-03-29

## 2018-01-05 SURGICAL SUPPLY — 16 items
BIPOLAR CUTTING LOOP 21FR (ELECTRODE)
CANISTER SUCT 3000ML PPV (MISCELLANEOUS) ×3 IMPLANT
CATH ROBINSON RED A/P 16FR (CATHETERS) ×3 IMPLANT
ELECT REM PT RETURN 9FT ADLT (ELECTROSURGICAL)
ELECTRODE REM PT RTRN 9FT ADLT (ELECTROSURGICAL) IMPLANT
GLOVE BIOGEL PI IND STRL 7.0 (GLOVE) ×2 IMPLANT
GLOVE BIOGEL PI INDICATOR 7.0 (GLOVE) ×4
GLOVE ECLIPSE 7.0 STRL STRAW (GLOVE) ×3 IMPLANT
GOWN STRL REUS W/TWL LRG LVL3 (GOWN DISPOSABLE) ×6 IMPLANT
HANDPIECE ABLA MINERVA ENDO (MISCELLANEOUS) ×3 IMPLANT
LOOP CUTTING BIPOLAR 21FR (ELECTRODE) IMPLANT
PACK VAGINAL MINOR WOMEN LF (CUSTOM PROCEDURE TRAY) ×3 IMPLANT
PAD OB MATERNITY 4.3X12.25 (PERSONAL CARE ITEMS) ×3 IMPLANT
TOWEL OR 17X24 6PK STRL BLUE (TOWEL DISPOSABLE) ×6 IMPLANT
TUBING AQUILEX INFLOW (TUBING) ×3 IMPLANT
TUBING AQUILEX OUTFLOW (TUBING) ×3 IMPLANT

## 2018-01-05 NOTE — Discharge Instructions (Signed)
DISCHARGE INSTRUCTIONS: D&C / D&E °The following instructions have been prepared to help you care for yourself upon your return home. °  °Personal hygiene: °• Use sanitary pads for vaginal drainage, not tampons. °• Shower the day after your procedure. °• NO tub baths, pools or Jacuzzis for 2-3 weeks. °• Wipe front to back after using the bathroom. ° °Activity and limitations: °• Do NOT drive or operate any equipment for 24 hours. The effects of anesthesia are still present and drowsiness may result. °• Do NOT rest in bed all day. °• Walking is encouraged. °• Walk up and down stairs slowly. °• You may resume your normal activity in one to two days or as indicated by your physician. ° °Sexual activity: NO intercourse for at least 2 weeks after the procedure, or as indicated by your physician. ° °Diet: Eat a light meal as desired this evening. You may resume your usual diet tomorrow. ° °Return to work: You may resume your work activities in one to two days or as indicated by your doctor. ° °What to expect after your surgery: Expect to have vaginal bleeding/discharge for 2-3 days and spotting for up to 10 days. It is not unusual to have soreness for up to 1-2 weeks. You may have a slight burning sensation when you urinate for the first day. Mild cramps may continue for a couple of days. You may have a regular period in 2-6 weeks. ° °Call your doctor for any of the following: °• Excessive vaginal bleeding, saturating and changing one pad every hour. °• Inability to urinate 6 hours after discharge from hospital. °• Pain not relieved by pain medication. °• Fever of 100.4° F or greater. °• Unusual vaginal discharge or odor. ° ° Call for an appointment:  ° ° °Patient’s signature: ______________________ ° °Nurse’s signature ________________________ ° °Support person's signature_______________________ ° ° °Hysteroscopy, Care After °Refer to this sheet in the next few weeks. These instructions provide you with information on  caring for yourself after your procedure. Your health care provider may also give you more specific instructions. Your treatment has been planned according to current medical practices, but problems sometimes occur. Call your health care provider if you have any problems or questions after your procedure. °What can I expect after the procedure? °After your procedure, it is typical to have the following: °· You may have some cramping. This normally lasts for a couple days. °· You may have bleeding. This can vary from light spotting for a few days to menstrual-like bleeding for 3-7 days. ° °Follow these instructions at home: °· Rest for the first 1-2 days after the procedure. °· Only take over-the-counter or prescription medicines as directed by your health care provider. Do not take aspirin. It can increase the chances of bleeding. °· Take showers instead of baths for 2 weeks or as directed by your health care provider. °· Do not drive for 24 hours or as directed. °· Do not drink alcohol while taking pain medicine. °· Do not use tampons, douche, or have sexual intercourse for 2 weeks or until your health care provider says it is okay. °· Take your temperature twice a day for 4-5 days. Write it down each time. °· Follow your health care provider's advice about diet, exercise, and lifting. °· If you develop constipation, you may: °? Take a mild laxative if your health care provider approves. °? Add bran foods to your diet. °? Drink enough fluids to keep your urine clear or pale yellow. °·   Try to have someone with you or available to you for the first 24-48 hours, especially if you were given a general anesthetic. °· Follow up with your health care provider as directed. °Contact a health care provider if: °· You feel dizzy or lightheaded. °· You feel sick to your stomach (nauseous). °· You have abnormal vaginal discharge. °· You have a rash. °· You have pain that is not controlled with medicine. °Get help right away  if: °· You have bleeding that is heavier than a normal menstrual period. °· You have a fever. °· You have increasing cramps or pain, not controlled with medicine. °· You have new belly (abdominal) pain. °· You pass out. °· You have pain in the tops of your shoulders (shoulder strap areas). °· You have shortness of breath. °This information is not intended to replace advice given to you by your health care provider. Make sure you discuss any questions you have with your health care provider. °Document Released: 03/15/2013 Document Revised: 10/31/2015 Document Reviewed: 12/22/2012 °Elsevier Interactive Patient Education © 2017 Else[employer] Inc[employer] ° °

## 2018-01-05 NOTE — Anesthesia Procedure Notes (Signed)
Procedure Name: Intubation Date/Time: 01/05/2018 1:56 PM Performed by: Bufford Spikes, CRNA Pre-anesthesia Checklist: Patient identified, Emergency Drugs available, Suction available and Patient being monitored Patient Re-evaluated:Patient Re-evaluated prior to induction Oxygen Delivery Method: Circle system utilized Preoxygenation: Pre-oxygenation with 100% oxygen Induction Type: IV induction Ventilation: Mask ventilation without difficulty Laryngoscope Size: Miller and 2 Grade View: Grade II Tube type: Oral Number of attempts: 1 Airway Equipment and Method: Stylet and Oral airway Placement Confirmation: ETT inserted through vocal cords under direct vision,  positive ETCO2 and breath sounds checked- equal and bilateral Secured at: 22 cm Tube secured with: Tape Dental Injury: Teeth and Oropharynx as per pre-operative assessment

## 2018-01-05 NOTE — Op Note (Signed)
Preoperative diagnosis: Menorrhagia  Postoperative diagnosis: Menorrhagia  Procedure: IUD removal, Minerva endometrial ablation, hysteroscopy, D & C  Surgeon: Standley Dakins. Kennon Rounds, M.D.  Anesthesia: GETT  Findings: Normal appearing cervix and uterine cavity with both ostia seen.  Estimated blood loss: Minimal  Specimens: Endometrial curettings  Disposition of specimen: Pathology  Reason for procedure: Patient is a 42 y.o. y.o. [id number] with Previous c-section x 2 with bleeding despite IUD and Megace who desired permanent treatment.   Procedure: Patient was taken to the OR where she was placed in dorsal lithotomy in [place]. SCDs were in place. An adequate timeout was obtained. The patient was prepped and draped in the usual sterile fashion. A red rubber catheter is used to drain her bladder. A speculum was placed inside the vagina and the cervix visualized. The cervix was grasped anteriorly with a single-tooth tenaculum.The uterus sounded to 13 cm. Cervix measured 6.5 cm. Sequential dilation was done hysteroscope was introduced into the uterine cavity. The cervix and endometrial lining appeared normal both ostia were seen there was no deformity of the cavity. The Minerva device inserted and a seal test was done and was passed. The uterine cavity was ablated. Repeat hysteroscopy performed. Sharp curettage was then performed and sample sent to pathology. All instrument, needle, and lap counts were correct x 2. The patient was awakened taken to recovery room in stable condition.  [name] MD 01/05/2018 2:47 PM

## 2018-01-05 NOTE — Anesthesia Preprocedure Evaluation (Addendum)
Anesthesia Evaluation  Patient identified by MRN, date of birth, ID band Patient awake    Reviewed: Allergy & Precautions, NPO status , Patient's Chart, lab work & pertinent test results  History of Anesthesia Complications Negative for: history of anesthetic complications  Airway Mallampati: II  TM Distance: >3 FB Neck ROM: Full    Dental  (+) Dental Advisory Given, Teeth Intact   Pulmonary Current Smoker,    breath sounds clear to auscultation       Cardiovascular hypertension, Pt. on medications  Rhythm:Regular Rate:Normal     Neuro/Psych  Headaches, Anxiety Depression    GI/Hepatic Neg liver ROS, GERD  Medicated and Controlled,  Endo/Other  Morbid obesity  Renal/GU negative Renal ROS  negative genitourinary   Musculoskeletal negative musculoskeletal ROS (+)   Abdominal (+) + obese,   Peds  Hematology  (+) HIV,   Anesthesia Other Findings HSV  Reproductive/Obstetrics                            Anesthesia Physical Anesthesia Plan  ASA: III  Anesthesia Plan: General   Post-op Pain Management:    Induction: Intravenous  PONV Risk Score and Plan: 3 and Treatment may vary due to age or medical condition, Ondansetron, Dexamethasone and Midazolam  Airway Management Planned: Oral ETT  Additional Equipment: None  Intra-op Plan:   Post-operative Plan: Extubation in OR  Informed Consent: I have reviewed the patients History and Physical, chart, labs and discussed the procedure including the risks, benefits and alternatives for the proposed anesthesia with the patient or authorized representative who has indicated his/her understanding and acceptance.   Dental advisory given  Plan Discussed with: CRNA and Anesthesiologist  Anesthesia Plan Comments:        Anesthesia Quick Evaluation

## 2018-01-05 NOTE — Anesthesia Postprocedure Evaluation (Signed)
Anesthesia Post Note  Patient: Cassandra [name]  Procedure(s) Performed: DILATATION AND CURETTAGE /HYSTEROSCOPY WITH MINERVA ABLATION (N/A Vagina )     Patient location during evaluation: PACU Anesthesia Type: General Level of consciousness: awake and alert Pain management: pain level controlled Vital Signs Assessment: post-procedure vital signs reviewed and stable Respiratory status: spontaneous breathing, nonlabored ventilation and respiratory function stable Cardiovascular status: blood pressure returned to baseline and stable Postop Assessment: no apparent nausea or vomiting Anesthetic complications: no    Last Vitals:  Vitals:   01/05/18 1515 01/05/18 1530  BP: (!) 141/93 (!) 141/103  Pulse: 91 92  Resp: 10 17  Temp:    SpO2: 97% 95%    Last Pain:  Vitals:   01/05/18 1505  TempSrc:   PainSc: 0-No pain   Pain Goal: Patients Stated Pain Goal: 4 (01/05/18 1307)               Audry Pili

## 2018-01-05 NOTE — Interval H&P Note (Signed)
History and Physical Interval Note:  01/05/2018 1:13 PM  Cassandra [name]  has presented today for surgery, with the diagnosis of AUB  The various methods of treatment have been discussed with the patient and family. After consideration of risks, benefits and other options for treatment, the patient has consented to  Procedure(s): DILATATION AND CURETTAGE /HYSTEROSCOPY WITH MINERVA ABLATION (N/A) as a surgical intervention .  The patient's history has been reviewed, patient examined, no change in status, stable for surgery.  I have reviewed the patient's chart and labs.  Questions were answered to the patient's satisfaction.     [name]

## 2018-01-05 NOTE — Transfer of Care (Signed)
Immediate Anesthesia Transfer of Care Note  Patient: Cassandra [name]  Procedure(s) Performed: DILATATION AND CURETTAGE /HYSTEROSCOPY WITH MINERVA ABLATION (N/A Vagina )  Patient Location: PACU  Anesthesia Type:General  Level of Consciousness: awake, alert  and oriented  Airway & Oxygen Therapy: Patient Spontanous Breathing and Patient connected to nasal cannula oxygen  Post-op Assessment: Report given to RN and Post -op Vital signs reviewed and stable  Post vital signs: Reviewed and stable  Last Vitals:  Vitals Value Taken Time  BP 130/88 01/05/2018  3:02 PM  Temp    Pulse 94 01/05/2018  3:05 PM  Resp 19 01/05/2018  3:05 PM  SpO2 99 % 01/05/2018  3:05 PM  Vitals shown include unvalidated device data.  Last Pain:  Vitals:   01/05/18 1307  TempSrc: Oral  PainSc: 0-No pain      Patients Stated Pain Goal: 4 (82/64/15 8309)  Complications: No apparent anesthesia complications

## 2018-01-06 ENCOUNTER — Encounter (HOSPITAL_COMMUNITY): Payer: Self-pay | Admitting: Family Medicine

## 2018-01-07 ENCOUNTER — Ambulatory Visit (INDEPENDENT_AMBULATORY_CARE_PROVIDER_SITE_OTHER): Payer: PRIVATE HEALTH INSURANCE | Admitting: Psychology

## 2018-01-07 DIAGNOSIS — F411 Generalized anxiety disorder: Secondary | ICD-10-CM

## 2018-01-26 ENCOUNTER — Encounter: Payer: Self-pay | Admitting: Family Medicine

## 2018-01-28 ENCOUNTER — Ambulatory Visit (INDEPENDENT_AMBULATORY_CARE_PROVIDER_SITE_OTHER): Payer: 59 | Admitting: Family Medicine

## 2018-01-28 ENCOUNTER — Encounter: Payer: Self-pay | Admitting: Family Medicine

## 2018-01-28 VITALS — BP 144/88 | HR 80 | Wt 346.0 lb

## 2018-01-28 DIAGNOSIS — N920 Excessive and frequent menstruation with regular cycle: Secondary | ICD-10-CM

## 2018-01-28 DIAGNOSIS — Z09 Encounter for follow-up examination after completed treatment for conditions other than malignant neoplasm: Secondary | ICD-10-CM

## 2018-01-28 NOTE — Progress Notes (Signed)
   Subjective:    Patient ID: Cassandra [name] is a 42 y.o. female presenting with Routine Post Op  on 01/28/2018  HPI: She is 3 wks post Minerva ablation. Notes some discharge. Nothing bloody. Not having any bleeding as yet. Not having any pain.  Review of Systems  Constitutional: Negative for chills and fever.  Respiratory: Negative for shortness of breath.   Cardiovascular: Negative for chest pain.  Gastrointestinal: Negative for abdominal pain, nausea and vomiting.  Genitourinary: Negative for dysuria.  Skin: Negative for rash.      Objective:    BP (!) 144/88   Pulse 80   Wt (!) 346 lb (156.9 kg)   BMI 53.39 kg/m  Physical Exam  Constitutional: She appears well-developed and well-nourished. No distress.  HENT:  Head: Normocephalic and atraumatic.  Eyes: No scleral icterus.  Neck: No thyromegaly present.  Cardiovascular: Normal rate and regular rhythm.  Pulmonary/Chest: Effort normal.  Abdominal: Soft. She exhibits no distension. There is no tenderness.  Neurological: She is alert.        Assessment & Plan:   Problem List Items Addressed This Visit      Unprioritized   Heavy menstrual bleeding    Much improved follwwing ablation.        Other Visit Diagnoses    Postop check    -  Primary      Total face-to-face time with patient: 10 minutes. Over 50% of encounter was spent on counseling and coordination of care. Return in about 4 weeks (around 02/25/2018).  [name] 01/28/2018 9:37 AM

## 2018-01-28 NOTE — Assessment & Plan Note (Signed)
Much improved follwwing ablation.

## 2018-02-11 ENCOUNTER — Other Ambulatory Visit: Payer: Self-pay | Admitting: Family Medicine

## 2018-02-11 DIAGNOSIS — N920 Excessive and frequent menstruation with regular cycle: Secondary | ICD-10-CM

## 2018-03-01 ENCOUNTER — Encounter: Payer: Self-pay | Admitting: Family Medicine

## 2018-03-01 ENCOUNTER — Ambulatory Visit (INDEPENDENT_AMBULATORY_CARE_PROVIDER_SITE_OTHER): Payer: 59 | Admitting: Family Medicine

## 2018-03-01 DIAGNOSIS — N920 Excessive and frequent menstruation with regular cycle: Secondary | ICD-10-CM | POA: Diagnosis not present

## 2018-03-01 DIAGNOSIS — Z23 Encounter for immunization: Secondary | ICD-10-CM | POA: Diagnosis not present

## 2018-03-01 NOTE — Assessment & Plan Note (Signed)
Much improved following endometrial ablation.

## 2018-03-01 NOTE — Progress Notes (Signed)
   Subjective:    Patient ID: Cassandra [name] is a 42 y.o. female presenting with No chief complaint on file.  on 03/01/2018  HPI: Bleeding is much improved. Cycles are spotting only.  Review of Systems  Constitutional: Negative for chills and fever.  Respiratory: Negative for shortness of breath.   Cardiovascular: Negative for chest pain.  Gastrointestinal: Negative for abdominal pain, nausea and vomiting.  Genitourinary: Negative for dysuria.  Skin: Negative for rash.      Objective:    BP 136/71 (BP Location: Left Arm, Patient Position: Sitting)   Pulse 100   Ht 5\' 9"  (1.753 m)   Wt (!) 339 lb (153.8 kg)   LMP 02/15/2018   BMI 50.06 kg/m  Physical Exam  Constitutional: She is oriented to person, place, and time. She appears well-developed and well-nourished. No distress.  HENT:  Head: Normocephalic and atraumatic.  Eyes: No scleral icterus.  Neck: Neck supple.  Cardiovascular: Normal rate.  Pulmonary/Chest: Effort normal.  Abdominal: Soft.  Neurological: She is alert and oriented to person, place, and time.  Skin: Skin is warm and dry.  Psychiatric: She has a normal mood and affect.        Assessment & Plan:   Problem List Items Addressed This Visit      Unprioritized   Heavy menstrual bleeding    Much improved following endometrial ablation.       Other Visit Diagnoses    Need for immunization against influenza       Relevant Orders   Flu Vaccine QUAD 36+ mos IM (Completed)      Total face-to-face time with patient: 10 minutes. Over 50% of encounter was spent on counseling and coordination of care. Return in about 6 months (around 08/30/2018) for a CPE.  [name] 03/01/2018 4:13 PM

## 2018-03-01 NOTE — Patient Instructions (Signed)

## 2018-03-29 ENCOUNTER — Encounter: Payer: Self-pay | Admitting: Infectious Diseases

## 2018-03-29 ENCOUNTER — Ambulatory Visit: Payer: 59 | Attending: Infectious Diseases | Admitting: Infectious Diseases

## 2018-03-29 ENCOUNTER — Other Ambulatory Visit
Admission: RE | Admit: 2018-03-29 | Discharge: 2018-03-29 | Disposition: A | Discharge status: Home / Self Care | Payer: 59 | Source: Ambulatory Visit | Attending: Infectious Diseases | Admitting: Infectious Diseases

## 2018-03-29 VITALS — BP 133/87 | HR 89 | Temp 98.0°F | Wt 341.0 lb

## 2018-03-29 DIAGNOSIS — Z79899 Other long term (current) drug therapy: Secondary | ICD-10-CM

## 2018-03-29 DIAGNOSIS — B20 Human immunodeficiency virus [HIV] disease: Secondary | ICD-10-CM | POA: Insufficient documentation

## 2018-03-29 DIAGNOSIS — Z791 Long term (current) use of non-steroidal anti-inflammatories (NSAID): Secondary | ICD-10-CM

## 2018-03-29 DIAGNOSIS — G43909 Migraine, unspecified, not intractable, without status migrainosus: Secondary | ICD-10-CM

## 2018-03-29 DIAGNOSIS — F17200 Nicotine dependence, unspecified, uncomplicated: Secondary | ICD-10-CM

## 2018-03-29 DIAGNOSIS — I10 Essential (primary) hypertension: Secondary | ICD-10-CM | POA: Insufficient documentation

## 2018-03-29 DIAGNOSIS — R197 Diarrhea, unspecified: Secondary | ICD-10-CM | POA: Diagnosis not present

## 2018-03-29 NOTE — Progress Notes (Signed)
NAME: Cassandra [name]  DOB: [birth year]  MRN: [id number]  Date/Time: 03/29/2018 11:36 AM Subjective:  Here to establish care for HIV. Previous provider Dr. Ola Spurr in [place] now                ? Cassandra [name] is a 42 y.o. female with a history of HIV, hypertension is here to establish care She is on [place].  She has been doing well.  Viral load is undetectable and CD4 is more than thousand.  She has had headaches for the past month especially on waking up and she has been taking ibuprofen and to 4 a day.  She also has 2-3 loose stools every day.  She has not seen her PCP for the same.  She states she gets migraine headache.  She has no nausea or vomiting.  No fever or chills.  Right light bothers her eyes.  She has not taken any antibiotic in the recent past.  HIV diagnosed 2013 [place] Cd4 >200 OI :none HAARt history first regimen was a Atripla Now on Biktarvy Acquired thru heterosexual contact.  Her husband is also positive for HIV.  And is on treatment ? 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 CESAREAN SECTION  [phone]   x2  . CHOLECYSTECTOMY  2018  . HYSTEROSCOPY W/ ENDOMETRIAL ABLATION  12/2017   Minerva for heavy bleeding, IUD removed  . HYSTEROSCOPY W/D&C N/A 01/05/2018   Procedure: DILATATION AND CURETTAGE /HYSTEROSCOPY WITH MINERVA ABLATION;  Surgeon: [name], MD;  Location: [facility];  Service: Gynecology;  Laterality: N/A;  . INTRAUTERINE DEVICE INSERTION     Mirena  . TUBAL LIGATION    . WISDOM TOOTH EXTRACTION     x 4  Social history Smoker Lives with her husband  Has 2 children Works in [employer]  Family History  Problem Relation Age of Onset  . Hypertension Mother   . Hypertension Father   . Diabetes Maternal Grandmother        s/p amputation  . Hyperlipidemia Maternal  Grandmother   . Hypertension Maternal Grandmother   . Stroke Maternal Grandmother   . Cancer Maternal Grandfather        skin cancer  . Hyperlipidemia Maternal Grandfather   . Hypertension Maternal Grandfather   . Seizures Paternal Grandmother 69       deceased  . Coronary artery disease Paternal Grandfather 25       MI  . Breast cancer Neg Hx    No Known Allergies ? Current Outpatient Medications  Medication Sig Dispense Refill  . amLODipine (NORVASC) 5 MG tablet Take 1 tablet (5 mg total) by mouth daily. (Patient taking differently: Take 5 mg by mouth at bedtime. ) 90 tablet 3  . BIKTARVY 50-200-25 MG TABS tablet 1 tablet at bedtime.    . Calcium Carb-Cholecalciferol (CALCIUM-VITAMIN D) 600-400 MG-UNIT TABS Take 1 tablet by mouth daily. 60 tablet   . hydrochlorothiazide (HYDRODIURIL) 25 MG tablet Take 1 tablet (25 mg total) by mouth daily. (Patient taking differently: Take 25 mg by mouth at bedtime. ) 90 tablet 3  . loratadine (CLARITIN) 10 MG tablet Take 10 mg by mouth daily as needed (ALLERGIES).     Marland Kitchen omeprazole (PRILOSEC) 40 MG capsule TAKE 1 CAPSULE BY MOUTH EVERY DAY (Patient taking differently: TAKE 1 CAPSULE BY MOUTH DAILY AS NEEDED QHS) 30 capsule 3  .  sertraline (ZOLOFT) 100 MG tablet TAKE 1 TABLET BY MOUTH EVERY DAY 90 tablet 1  . megestrol (MEGACE) 40 MG tablet Take 1 tablet (40 mg total) by mouth at bedtime. (Patient not taking: Reported on 03/01/2018) 90 tablet 3   No current facility-administered medications for this visit.     REVIEW OF SYSTEMS:  Const: negative fever, negative chills, negative weight loss Eyes: negative diplopia or visual changes, negative eye pain ENT: negative coryza, negative sore throat Resp: negative cough, hemoptysis, dyspnea Cards: negative for chest pain, palpitations, lower extremity edema GU: negative for frequency, dysuria and hematuria GI: Has diarrhea, no nausea or vomiting or abdominal pain Skin: negative for rash and  pruritus Heme: negative for easy bruising and gum/nose bleeding MS: negative for myalgias, arthralgias, back pain and muscle weakness Neurolo: Positive headaches, no dizziness, vertigo, memory problems  Psych: negative for feelings of anxiety, depression   Objective:  VITALS:  BP 133/87 (BP Location: Left Arm, Patient Position: Sitting, Cuff Size: Large)   Pulse 89   Temp 98 F (36.7 C) (Oral)   Wt (!) 341 lb (154.7 kg)   BMI 50.36 kg/m  PHYSICAL EXAM:  General: Alert, cooperative, no distress, appears stated age.  Obese Head: Normocephalic, without obvious abnormality, atraumatic. Eyes: Conjunctivae clear, anicteric sclerae. Pupils are equal Nose: Nares normal. No drainage or sinus tenderness. Throat: Lips, mucosa, and tongue normal. No Thrush Neck: Supple, symmetrical, no adenopathy, thyroid: non tender no carotid bruit and no JVD. Back: No CVA tenderness. Lungs: Clear to auscultation bilaterally. No Wheezing or Rhonchi. No rales. Heart: Regular rate and rhythm, no murmur, rub or gallop. Abdomen: Soft, non-tender,not distended. Bowel sounds normal. No masses Extremities: Extremities normal, atraumatic, no cyanosis. No edema. No clubbing Skin: No rashes or lesions. Not Jaundiced Lymph: Cervical, supraclavicular normal. Neurologic: Grossly non-focal Pertinent Labs 11/23/2017 Viral load less than 20 05/25/2017 CD4 is 1141(49%)  Health maintenance Vaccination pneumovac- 23 on 01/04/2015 Prevnar-13  HepB HepA TdaP  Flu Herpes zoster- RPR HEPC ab GC/CHl Anal PAP Lipid CMv TOXO -IGG/IgM HIV VL Cd4 quantiferon Gold   Impression/Recommendation ?42 y.o. female with a history of HIV, hypertension is here to establish care She is on [place].  She has been doing well.  Viral load is undetectable and CD4 is more than thousand.   ? ?HIV under good control.  On Biktarvy.  100% adherent.  Viral load less than 20 and CD4 more than thousand. Continue  Biktarvy  Hypertension on amlodipine" hydrochlorthiazid  Recent headache.  To discuss with her PCP Recent loose stools.  Discussed with PCP.  Health maintenance need to be updated Vaccine status has to be updated ___________________________________________________ discussed the management with the patient Follow-up in 6 months.

## 2018-03-29 NOTE — Patient Instructions (Signed)
You are here to establish HIV care Your last Cd4 is 1154 and Vl <20. You are on Biktarvy. You have been having headache and diarrhea for 4 weeks now- please check with your PCP as ou have been taking too many Ibuprofen . Today will send Hep A/B labs to see whether you need to be vaccinated.

## 2018-03-30 LAB — HEPATITIS C ANTIBODY: HCV Ab: <0.1 s/co ratio (ref 0.0–0.9)

## 2018-03-30 LAB — HEPATITIS B CORE ANTIBODY, IGM: Hep B C IgM: NEGATIVE

## 2018-03-30 LAB — HEPATITIS B SURFACE ANTIGEN: Hepatitis B Surface Ag: NEGATIVE

## 2018-03-30 LAB — HEPATITIS B SURFACE ANTIBODY, QUANTITATIVE: Hepatitis B-Post: <3.1 m[IU]/mL — ABNORMAL LOW (ref >9.9)

## 2018-03-30 LAB — HEPATITIS A ANTIBODY, TOTAL: Hep A Total Ab: NEGATIVE

## 2018-04-04 ENCOUNTER — Encounter: Payer: Self-pay | Admitting: Family Medicine

## 2018-04-04 ENCOUNTER — Ambulatory Visit (INDEPENDENT_AMBULATORY_CARE_PROVIDER_SITE_OTHER): Payer: 59 | Admitting: Family Medicine

## 2018-04-04 VITALS — BP 124/84 | HR 100 | Temp 98.2°F | Ht 69.0 in | Wt 337.2 lb

## 2018-04-04 DIAGNOSIS — R197 Diarrhea, unspecified: Secondary | ICD-10-CM | POA: Diagnosis not present

## 2018-04-04 DIAGNOSIS — K529 Noninfective gastroenteritis and colitis, unspecified: Secondary | ICD-10-CM | POA: Insufficient documentation

## 2018-04-04 NOTE — Progress Notes (Signed)
BP 124/84 (BP Location: Left Arm, Patient Position: Sitting, Cuff Size: Large)   Pulse 100   Temp 98.2 F (36.8 C) (Oral)   Ht 5\' 9"  (1.753 m)   Wt (!) 337 lb 4 oz (153 kg)   SpO2 96%   BMI 49.80 kg/m    CC: diarrhea Subjective:    Patient ID: Cassandra [name], female    DOB: 1976-02-21, 42 y.o.   MRN: [id number]  HPI: Cassandra [name] is a 42 y.o. female presenting on 04/04/2018 for Diarrhea (C/o diarrhea, RLQ pain and nausea. Sxs started 1 mo ago.  )   1 mo h/o loose or watery stools - 1-3 times a day. No blood or mucous in stool, not clay colored. Some RLQ abdominal discomfort intermittently as well as some nausea. BMs do not awaken her at night. No fevers/chills, vomiting.   No sick contacts at home.  S/p cholecystectomy 2018.  No recent travel.  Uses city water.   Saw ID last week, note reviewed. Continues Biktarvy for well controlled HIV. Acute hep panel was negative, but she was not immune to Hep B.   Relevant past medical, surgical, family and social history reviewed and updated as indicated. Interim medical history since our last visit reviewed. Allergies and medications reviewed and updated. Outpatient Medications Prior to Visit  Medication Sig Dispense Refill  . amLODipine (NORVASC) 5 MG tablet Take 1 tablet (5 mg total) by mouth daily. (Patient taking differently: Take 5 mg by mouth at bedtime. ) 90 tablet 3  . BIKTARVY 50-200-25 MG TABS tablet 1 tablet at bedtime.    . Calcium Carb-Cholecalciferol (CALCIUM-VITAMIN D) 600-400 MG-UNIT TABS Take 1 tablet by mouth daily. 60 tablet   . hydrochlorothiazide (HYDRODIURIL) 25 MG tablet Take 1 tablet (25 mg total) by mouth daily. (Patient taking differently: Take 25 mg by mouth at bedtime. ) 90 tablet 3  . loratadine (CLARITIN) 10 MG tablet Take 10 mg by mouth daily as needed (ALLERGIES).     Marland Kitchen omeprazole (PRILOSEC) 40 MG capsule TAKE 1 CAPSULE BY MOUTH EVERY DAY (Patient taking differently: TAKE 1 CAPSULE BY MOUTH DAILY AS  NEEDED QHS) 30 capsule 3  . sertraline (ZOLOFT) 100 MG tablet TAKE 1 TABLET BY MOUTH EVERY DAY 90 tablet 1  . megestrol (MEGACE) 40 MG tablet Take 1 tablet (40 mg total) by mouth at bedtime. (Patient not taking: Reported on 03/01/2018) 90 tablet 3   No facility-administered medications prior to visit.      Per HPI unless specifically indicated in ROS section below Review of Systems     Objective:    BP 124/84 (BP Location: Left Arm, Patient Position: Sitting, Cuff Size: Large)   Pulse 100   Temp 98.2 F (36.8 C) (Oral)   Ht 5\' 9"  (1.753 m)   Wt (!) 337 lb 4 oz (153 kg)   SpO2 96%   BMI 49.80 kg/m   Wt Readings from Last 3 Encounters:  04/04/18 (!) 337 lb 4 oz (153 kg)  03/29/18 (!) 341 lb (154.7 kg)  03/01/18 (!) 339 lb (153.8 kg)    Physical Exam  Constitutional: She appears well-developed and well-nourished. No distress.  HENT:  Mouth/Throat: Oropharynx is clear and moist. No oropharyngeal exudate.  Neck: No thyromegaly present.  Cardiovascular: Normal rate, regular rhythm and normal heart sounds.  No murmur heard. Pulmonary/Chest: Effort normal and breath sounds normal. No respiratory distress. She has no wheezes. She has no rales.  Abdominal: Soft. Bowel sounds are normal.  She exhibits no distension and no mass. There is no hepatosplenomegaly. There is tenderness (moderate) in the right upper quadrant. There is no rebound, no guarding, no CVA tenderness and negative Murphy's sign. No hernia.  Musculoskeletal: She exhibits no edema.  Skin: No erythema.  No jaundice  Psychiatric: She has a normal mood and affect.  Nursing note and vitals reviewed.  Results for orders placed or performed during the hospital encounter of 03/29/18  Hepatitis C Antibody  Result Value Ref Range   HCV Ab <0.1 0.0 - 0.9 s/co ratio  Hepatitis B surface antigen  Result Value Ref Range   Hepatitis B Surface Ag Negative Negative  Hepatitis B core antibody, IgM  Result Value Ref Range   Hep B  C IgM Negative Negative  Hepatitis B surface antibody,quantitative  Result Value Ref Range   Hepatitis B-Post <3.1 (L) Immunity>9.9 mIU/mL  Hepatitis A Ab, Total  Result Value Ref Range   Hep A Total Ab Negative Negative      Assessment & Plan:   Problem List Items Addressed This Visit    Diarrhea - Primary    With RUQ pain and intermittent nausea - check labs today (CBC, CMP, lipase, TSH). She is s/p cholecystectomy.  Not consistent with infectious diarrhea. No identified risk factors for parasitic infection.  If normal labs and ongoing diarrhea, consider stool GI pathogen panel.  If unrevealing evaluation, consider SSRI contribution (newest med, started 12/2017).       Relevant Orders   CBC with Differential/Platelet   TSH   Comprehensive metabolic panel   Lipase       No orders of the defined types were placed in this encounter.  Orders Placed This Encounter  Procedures  . CBC with Differential/Platelet  . TSH  . Comprehensive metabolic panel  . Lipase    Follow up plan: No follow-ups on file.  Ria Bush, MD

## 2018-04-04 NOTE — Assessment & Plan Note (Signed)
With RUQ pain and intermittent nausea - check labs today (CBC, CMP, lipase, TSH). She is s/p cholecystectomy.  Not consistent with infectious diarrhea. No identified risk factors for parasitic infection.  If normal labs and ongoing diarrhea, consider stool GI pathogen panel.  If unrevealing evaluation, consider SSRI contribution (newest med, started 12/2017).

## 2018-04-04 NOTE — Patient Instructions (Addendum)
Labs today. Bland diet, avoid fatty greasy foods.  Let us know if fever, worsening pain, nausea/vomiting or yellowing of the skin.

## 2018-04-05 LAB — CBC WITH DIFFERENTIAL/PLATELET
Basophils Absolute: 0.1 10*3/uL (ref 0.0–0.1)
Basophils Relative: 0.9 % (ref 0.0–3.0)
Eosinophils Absolute: 0.2 10*3/uL (ref 0.0–0.7)
Eosinophils Relative: 1.8 % (ref 0.0–5.0)
HCT: 42.3 % (ref 36.0–46.0)
Hemoglobin: 14.3 g/dL (ref 12.0–15.0)
Lymphocytes Relative: 23.1 % (ref 12.0–46.0)
Lymphs Abs: 2.2 10*3/uL (ref 0.7–4.0)
MCHC: 33.7 g/dL (ref 30.0–36.0)
MCV: 96.8 fl (ref 78.0–100.0)
Monocytes Absolute: 0.5 10*3/uL (ref 0.1–1.0)
Monocytes Relative: 5 % (ref 3.0–12.0)
Neutro Abs: 6.5 10*3/uL (ref 1.4–7.7)
Neutrophils Relative %: 69.2 % (ref 43.0–77.0)
Platelets: 359 10*3/uL (ref 150.0–400.0)
RBC: 4.37 Mil/uL (ref 3.87–5.11)
RDW: 12.7 % (ref 11.5–15.5)
WBC: 9.4 10*3/uL (ref 4.0–10.5)

## 2018-04-05 LAB — COMPREHENSIVE METABOLIC PANEL
ALT: 65 U/L — ABNORMAL HIGH (ref 0–35)
AST: 52 U/L — ABNORMAL HIGH (ref 0–37)
Albumin: 4.3 g/dL (ref 3.5–5.2)
Alkaline Phosphatase: 90 U/L (ref 39–117)
BUN: 10 mg/dL (ref 6–23)
CO2: 34 mEq/L — ABNORMAL HIGH (ref 19–32)
Calcium: 9.4 mg/dL (ref 8.4–10.5)
Chloride: 96 mEq/L (ref 96–112)
Creatinine, Ser: 1.14 mg/dL (ref 0.40–1.20)
GFR: 55.9 mL/min — ABNORMAL LOW (ref >60.00)
Glucose, Bld: 189 mg/dL — ABNORMAL HIGH (ref 70–99)
Potassium: 3.3 mEq/L — ABNORMAL LOW (ref 3.5–5.1)
Sodium: 138 mEq/L (ref 135–145)
Total Bilirubin: 0.5 mg/dL (ref 0.2–1.2)
Total Protein: 7.7 g/dL (ref 6.0–8.3)

## 2018-04-05 LAB — LIPASE: Lipase: 19 U/L (ref 11.0–59.0)

## 2018-04-05 LAB — TSH: TSH: 2.43 u[IU]/mL (ref 0.35–4.50)

## 2018-04-06 ENCOUNTER — Encounter: Payer: Self-pay | Admitting: Family Medicine

## 2018-04-08 ENCOUNTER — Telehealth: Payer: Self-pay

## 2018-04-08 ENCOUNTER — Encounter: Payer: Self-pay | Admitting: Family Medicine

## 2018-04-08 ENCOUNTER — Other Ambulatory Visit: Payer: Self-pay | Admitting: Family Medicine

## 2018-04-08 DIAGNOSIS — R110 Nausea: Secondary | ICD-10-CM

## 2018-04-08 DIAGNOSIS — R197 Diarrhea, unspecified: Secondary | ICD-10-CM

## 2018-04-08 MED ORDER — ONDANSETRON HCL 4 MG PO TABS: 4.0000 mg | ORAL_TABLET | Freq: Three times a day (TID) | ORAL | 0 refills | Status: DC | PRN

## 2018-04-08 NOTE — Telephone Encounter (Signed)
zofran sent for nausea. See result note.

## 2018-04-08 NOTE — Telephone Encounter (Signed)
Left message on vm per dpr asking pt to call back to let us know what are her questions about her labs. Also, sent MyChart message.

## 2018-04-08 NOTE — Telephone Encounter (Signed)
Spoke with pt answering all lab questions to her satisfaction.

## 2018-04-08 NOTE — Telephone Encounter (Signed)
Left message on vm per dpr asking pt to call back with questions she has about her labs. [See Pt Msg, 04/05/18.]

## 2018-04-13 ENCOUNTER — Emergency Department (HOSPITAL_COMMUNITY)
Admission: EM | Admit: 2018-04-13 | Discharge: 2018-04-13 | Disposition: A | Discharge status: Home / Self Care | Payer: 59 | Attending: Emergency Medicine | Admitting: Emergency Medicine

## 2018-04-13 ENCOUNTER — Other Ambulatory Visit: Payer: Self-pay

## 2018-04-13 ENCOUNTER — Ambulatory Visit
Admission: RE | Admit: 2018-04-13 | Discharge: 2018-04-13 | Disposition: A | Discharge status: Home / Self Care | Payer: 59 | Source: Ambulatory Visit | Attending: Family Medicine | Admitting: Family Medicine

## 2018-04-13 ENCOUNTER — Encounter (HOSPITAL_COMMUNITY): Payer: Self-pay

## 2018-04-13 DIAGNOSIS — I10 Essential (primary) hypertension: Secondary | ICD-10-CM | POA: Insufficient documentation

## 2018-04-13 DIAGNOSIS — Z87891 Personal history of nicotine dependence: Secondary | ICD-10-CM | POA: Diagnosis not present

## 2018-04-13 DIAGNOSIS — R42 Dizziness and giddiness: Secondary | ICD-10-CM | POA: Diagnosis present

## 2018-04-13 DIAGNOSIS — R110 Nausea: Secondary | ICD-10-CM

## 2018-04-13 DIAGNOSIS — E876 Hypokalemia: Secondary | ICD-10-CM | POA: Insufficient documentation

## 2018-04-13 DIAGNOSIS — R197 Diarrhea, unspecified: Secondary | ICD-10-CM

## 2018-04-13 DIAGNOSIS — Z79899 Other long term (current) drug therapy: Secondary | ICD-10-CM | POA: Insufficient documentation

## 2018-04-13 LAB — BASIC METABOLIC PANEL
Anion gap: 10 (ref 5–15)
BUN: 9 mg/dL (ref 6–20)
CO2: 34 mmol/L — ABNORMAL HIGH (ref 22–32)
Calcium: 8.8 mg/dL — ABNORMAL LOW (ref 8.9–10.3)
Chloride: 94 mmol/L — ABNORMAL LOW (ref 98–111)
Creatinine, Ser: 1.01 mg/dL — ABNORMAL HIGH (ref 0.44–1.00)
GFR calc Af Amer: >60 mL/min (ref >60)
GFR calc non Af Amer: >60 mL/min (ref >60)
Glucose, Bld: 186 mg/dL — ABNORMAL HIGH (ref 70–99)
Potassium: 2.6 mmol/L — CL (ref 3.5–5.1)
Sodium: 138 mmol/L (ref 135–145)

## 2018-04-13 LAB — URINALYSIS, ROUTINE W REFLEX MICROSCOPIC
Bilirubin Urine: NEGATIVE
Glucose, UA: NEGATIVE mg/dL
Hgb urine dipstick: NEGATIVE
Ketones, ur: NEGATIVE mg/dL
Leukocytes, UA: NEGATIVE
Nitrite: NEGATIVE
Protein, ur: NEGATIVE mg/dL
Specific Gravity, Urine: 1.018 (ref 1.005–1.030)
pH: 6 (ref 5.0–8.0)

## 2018-04-13 LAB — I-STAT CHEM 8, ED
BUN: 5 mg/dL — ABNORMAL LOW (ref 6–20)
Calcium, Ion: 1.04 mmol/L — ABNORMAL LOW (ref 1.15–1.40)
Chloride: 97 mmol/L — ABNORMAL LOW (ref 98–111)
Creatinine, Ser: 0.9 mg/dL (ref 0.44–1.00)
Glucose, Bld: 104 mg/dL — ABNORMAL HIGH (ref 70–99)
HCT: 38 % (ref 36.0–46.0)
Hemoglobin: 12.9 g/dL (ref 12.0–15.0)
Potassium: 3 mmol/L — ABNORMAL LOW (ref 3.5–5.1)
Sodium: 140 mmol/L (ref 135–145)
TCO2: 34 mmol/L — ABNORMAL HIGH (ref 22–32)

## 2018-04-13 LAB — CBC
HCT: 41.8 % (ref 36.0–46.0)
Hemoglobin: 13.8 g/dL (ref 12.0–15.0)
MCH: 32.3 pg (ref 26.0–34.0)
MCHC: 33 g/dL (ref 30.0–36.0)
MCV: 97.9 fL (ref 80.0–100.0)
Platelets: 336 10*3/uL (ref 150–400)
RBC: 4.27 MIL/uL (ref 3.87–5.11)
RDW: 11.9 % (ref 11.5–15.5)
WBC: 8.5 10*3/uL (ref 4.0–10.5)
nRBC: 0 % (ref 0.0–0.2)

## 2018-04-13 LAB — I-STAT BETA HCG BLOOD, ED (MC, WL, AP ONLY): I-stat hCG, quantitative: <5 m[IU]/mL (ref <5)

## 2018-04-13 LAB — HEPATIC FUNCTION PANEL
ALT: 64 U/L — ABNORMAL HIGH (ref 0–44)
AST: 59 U/L — ABNORMAL HIGH (ref 15–41)
Albumin: 3.8 g/dL (ref 3.5–5.0)
Alkaline Phosphatase: 89 U/L (ref 38–126)
Bilirubin, Direct: 0.1 mg/dL (ref 0.0–0.2)
Indirect Bilirubin: 0.4 mg/dL (ref 0.3–0.9)
Total Bilirubin: 0.5 mg/dL (ref 0.3–1.2)
Total Protein: 7.6 g/dL (ref 6.5–8.1)

## 2018-04-13 LAB — CBG MONITORING, ED: Glucose-Capillary: 183 mg/dL — ABNORMAL HIGH (ref 70–99)

## 2018-04-13 LAB — LIPASE, BLOOD: Lipase: 25 U/L (ref 11–51)

## 2018-04-13 MED ORDER — POTASSIUM CHLORIDE IN NACL 20-0.9 MEQ/L-% IV SOLN
Freq: Once | INTRAVENOUS | Status: AC
  Administered 2018-04-13: 15:00:00 via INTRAVENOUS
  Filled 2018-04-13: qty 1000

## 2018-04-13 MED ORDER — POTASSIUM CHLORIDE CRYS ER 20 MEQ PO TBCR
40.0000 meq | EXTENDED_RELEASE_TABLET | Freq: Once | ORAL | Status: AC
  Administered 2018-04-13: 40 meq via ORAL
  Filled 2018-04-13: qty 2

## 2018-04-13 MED ORDER — ONDANSETRON HCL 4 MG/2ML IJ SOLN
4.0000 mg | Freq: Once | INTRAMUSCULAR | Status: AC
  Administered 2018-04-13: 4 mg via INTRAVENOUS
  Filled 2018-04-13: qty 2

## 2018-04-13 MED ORDER — POTASSIUM CHLORIDE CRYS ER 20 MEQ PO TBCR: 20.0000 meq | EXTENDED_RELEASE_TABLET | Freq: Every day | ORAL | 0 refills | Status: DC

## 2018-04-13 MED ORDER — FENTANYL CITRATE (PF) 100 MCG/2ML IJ SOLN
25.0000 ug | Freq: Once | INTRAMUSCULAR | Status: AC
  Administered 2018-04-13: 25 ug via INTRAVENOUS
  Filled 2018-04-13: qty 2

## 2018-04-13 MED ORDER — SODIUM CHLORIDE 0.9 % IV BOLUS
1000.0000 mL | Freq: Once | INTRAVENOUS | Status: AC
  Administered 2018-04-13: 1000 mL via INTRAVENOUS

## 2018-04-13 NOTE — ED Provider Notes (Signed)
[facility] DEPT Provider Note   CSN: [id number] Arrival date & time: 04/13/18  1149     History   Chief Complaint Chief Complaint  Patient presents with  . Dizziness    HPI Cassandra [name] is a 42 y.o. female.  42 y.o female with a PMH of HIV,GERD, HTN, Morbid obese presents to the ED with a chief complaint of dizziness x this morning.  She reports she had an ultrasound schedule for her abdomen this morning and did not have anything to eat, after the ultrasound she felt dizzy and experiencing some abdominal pain.  She describes her abdominal pain as "ripping on her stomach that radiates to her lower back".  Patient had an ultrasound done this morning as she is been having this abdominal pain for 1 week and her primary care physician want her to have a ultrasound of her abdomen.  She reports has been having diarrhea episodes for the past month.  Today she also reports a headache that came on during her ED visit.I have reviewed patient's previous records and see patient has been having headaches for the past couple of months.  She denies any fever, urinary symptoms, chest pain, shortness of breath or URI symptoms.       Past Medical History:  Diagnosis Date  . 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 Anxiety   . Depression   . Gallstones 06/2016   by xray - surgery to remove  . Generalized headaches    frequent  . GERD (gastroesophageal reflux disease)   . History of abnormal Pap smear    remote  . History of gestational diabetes    first 2 pregnancies  . 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[place])    CD4 level is 1100 per patient  . HSV-2 seropositive   . Hypertension   . Morbid obesity ([place])   . Periodontal disease 08/2011   currently getting dental work  . Tobacco use     Patient Active Problem List   Diagnosis Date Noted  . Diarrhea 04/04/2018  . Cervical high risk human papillomavirus (HPV) DNA test positive 11/17/2017  . Cervical sprain 10/08/2017   . Closed compression fracture of L1 lumbar vertebra 07/05/2016  . Lumbar pain with radiation down both legs 12/20/2015  . Health maintenance examination 05/25/2014  . Ulnar neuropathy 06/28/2013  . Heavy menstrual bleeding 10/19/2012  . Hypertension 09/14/2012  . HIV (human immunodeficiency virus infection) ([place]) 01/04/2012  . Varicose vein 01/01/2012  . MDD (major depressive disorder), recurrent episode, moderate ([place]) 09/02/2011  . Reflux 09/02/2011  . Morbid obesity with BMI of 50.0-59.9, adult ([place])   . Ex-smoker     Past Surgical History:  Procedure Laterality Date  . CESAREAN SECTION  [phone]   x2  . CHOLECYSTECTOMY  2018  . HYSTEROSCOPY W/D&C N/A 01/05/2018   [place] for heavy bleeding, IUD removed - DILATATION AND CURETTAGE /HYSTEROSCOPY WITH MINERVA ABLATION; [name], MD  . [place]  . TUBAL LIGATION    . WISDOM TOOTH EXTRACTION     x 4     OB History    Gravida  2   Para  2   Term  2   Preterm  0   AB  0   Living  2     SAB  0   TAB  0   Ectopic  0   Multiple  0   Live Births  2            Home Medications    Prior to Admission medications   Medication Sig Start Date End Date Taking? Authorizing Provider  acetaminophen (TYLENOL) 500 MG tablet Take 1,000 mg by mouth daily as needed.   Yes [provider]  amLODipine (NORVASC) 5 MG tablet Take 1 tablet (5 mg total) by mouth daily. Patient taking differently: Take 5 mg by mouth at bedtime.  12/03/17  Yes Ria Bush, MD  BIKTARVY 50-200-25 MG TABS tablet 1 tablet at bedtime. 09/15/17  Yes [provider]  Calcium Carb-Cholecalciferol (CALCIUM-VITAMIN D) 600-400 MG-UNIT TABS Take 1 tablet by mouth daily. 07/05/16  Yes Ria Bush, MD  hydrochlorothiazide (HYDRODIURIL) 25 MG tablet Take 1 tablet (25 mg total) by mouth daily. Patient taking differently: Take 25 mg by mouth at bedtime.  12/03/17  Yes Ria Bush, MD    omeprazole (PRILOSEC) 40 MG capsule TAKE 1 CAPSULE BY MOUTH EVERY DAY Patient taking differently: TAKE 1 CAPSULE BY MOUTH DAILY AS NEEDED QHS 08/06/17  Yes Ria Bush, MD  ondansetron (ZOFRAN) 4 MG tablet Take 1 tablet (4 mg total) by mouth every 8 (eight) hours as needed for nausea or vomiting. 04/08/18  Yes Ria Bush, MD  sertraline (ZOLOFT) 100 MG tablet TAKE 1 TABLET BY MOUTH EVERY DAY 01/04/18  Yes Ria Bush, MD  potassium chloride SA (K-DUR,KLOR-CON) 20 MEQ tablet Take 1 tablet (20 mEq total) by mouth daily for 7 days. 04/13/18 04/20/18  Janeece Fitting, PA-C    Family History Family History  Problem Relation Age of Onset  . Hypertension Mother   . Hypertension Father   . Diabetes Maternal Grandmother        s/p amputation  . Hyperlipidemia Maternal Grandmother   . Hypertension Maternal Grandmother   . Stroke Maternal Grandmother   . Cancer Maternal Grandfather        skin cancer  . Hyperlipidemia Maternal Grandfather   . Hypertension Maternal Grandfather   . Seizures Paternal Grandmother 32       deceased  . Coronary artery disease Paternal Grandfather 27       MI  . Breast cancer Neg Hx     Social History Social History   Tobacco Use  . Smoking status: Former Smoker    Packs/day: 0.10    Years: 23.00    Pack years: 2.30    Types: Cigarettes    Last attempt to quit: 02/02/2018    Years since quitting: 0.1  . Smokeless tobacco: Never Used  Substance Use Topics  . Alcohol use: Yes    Alcohol/week: 0.0 standard drinks    Comment: occasional  . Drug use: No     Allergies   Patient has no known allergies.   Review of Systems Review of Systems  Constitutional: Positive for diaphoresis. Negative for fever.  HENT: Negative for sore throat.   Respiratory: Negative for shortness of breath.   Cardiovascular: Negative for chest pain.  Gastrointestinal: Positive for abdominal pain, diarrhea and nausea. Negative for vomiting.  Genitourinary:  Negative for dysuria and flank pain.  Musculoskeletal: Positive for back pain and myalgias.  Skin: Negative for pallor and wound.  Neurological: Positive for headaches.     Physical Exam Updated Vital Signs BP 132/81   Pulse 69   Temp 98.5 F (36.9 C) (Oral)   Resp 16   Ht 5\' 9"  (1.753 m)   Wt (!) 153.8 kg   SpO2 96%   BMI 50.06 kg/m  Physical Exam  Constitutional: She is oriented to person, place, and time. She appears well-developed and well-nourished. No distress.  HENT:  Head: Normocephalic and atraumatic.  Mouth/Throat: Oropharynx is clear and moist. No oropharyngeal exudate.  Eyes: Pupils are equal, round, and reactive to light.  Neck: Normal range of motion.  Cardiovascular: Regular rhythm and normal heart sounds.  Pulmonary/Chest: Effort normal and breath sounds normal. No respiratory distress.  Abdominal: Soft. Bowel sounds are normal. She exhibits no distension and no mass. There is hepatomegaly. There is tenderness in the right upper quadrant and epigastric area. There is rebound. There is no rigidity, no guarding, no CVA tenderness and negative Murphy's sign. No hernia.  Musculoskeletal: She exhibits no tenderness or deformity.       Right lower leg: She exhibits no edema.       Left lower leg: She exhibits no edema.  Neurological: She is alert and oriented to person, place, and time.  Skin: Skin is warm and dry.  Psychiatric: She has a normal mood and affect.  Nursing note and vitals reviewed.    ED Treatments / Results  Labs (all labs ordered are listed, but only abnormal results are displayed) Labs Reviewed  BASIC METABOLIC PANEL - Abnormal; Notable for the following components:      Result Value   Potassium 2.6 (*)    Chloride 94 (*)    CO2 34 (*)    Glucose, Bld 186 (*)    Creatinine, Ser 1.01 (*)    Calcium 8.8 (*)    All other components within normal limits  URINALYSIS, ROUTINE W REFLEX MICROSCOPIC - Abnormal; Notable for the following  components:   APPearance HAZY (*)    All other components within normal limits  HEPATIC FUNCTION PANEL - Abnormal; Notable for the following components:   AST 59 (*)    ALT 64 (*)    All other components within normal limits  CBG MONITORING, ED - Abnormal; Notable for the following components:   Glucose-Capillary 183 (*)    All other components within normal limits  I-STAT CHEM 8, ED - Abnormal; Notable for the following components:   Potassium 3.0 (*)    Chloride 97 (*)    BUN 5 (*)    Glucose, Bld 104 (*)    Calcium, Ion 1.04 (*)    TCO2 34 (*)    All other components within normal limits  CBC  LIPASE, BLOOD  I-STAT BETA HCG BLOOD, ED ([facility], [facility], [facility] ONLY)    EKG EKG Interpretation  Date/Time:  Wednesday April 13 2018 11:58:09 EST Ventricular Rate:  90 PR Interval:    QRS Duration: 92 QT Interval:  371 QTC Calculation: 454 R Axis:   74 Text Interpretation:  Sinus rhythm Low voltage, precordial leads Borderline T abnormalities, anterior leads since last tracing no significant change Confirmed by Daleen Bo [phone]) on 04/13/2018 2:17:12 PM   Radiology US Abdomen Complete  Result Date: 04/13/2018 CLINICAL DATA:  Right upper quadrant abdominal pain. EXAM: ABDOMEN ULTRASOUND COMPLETE COMPARISON:  None. FINDINGS: Gallbladder: Surgically absent. Common bile duct: Diameter: 3.3 mm, within normal limits Liver: The liver is diffusely echogenic. No focal lesions are present. Portal vein is patent on color Doppler imaging with normal direction of blood flow towards the liver. IVC: Not visualized due to overlying bowel gas. Pancreas: Not visualized due to overlying bowel gas. Spleen: Size and appearance within normal limits. Right Kidney: Length: 10.3 cm, within normal limits. Echogenicity within normal limits. No mass or  hydronephrosis visualized. Left Kidney: Length: 11.9 cm, within normal limits. Echogenicity within normal limits. No mass or hydronephrosis visualized. Abdominal  aorta: No aneurysm visualized. Other findings: None. IMPRESSION: 1. No acute or focal lesion to explain the patient's right upper quadrant pain. 2. The liver is diffusely echogenic, suggesting hepatic steatosis. 3. Cholelithiasis. Electronically Signed   By: San Morelle M.D.   On: 04/13/2018 14:11    Procedures Procedures (including critical care time)  Medications Ordered in ED Medications  potassium chloride SA (K-DUR,KLOR-CON) CR tablet 40 mEq (40 mEq Oral Given 04/13/18 1419)  0.9 % NaCl with KCl 20 mEq/ L  infusion ( Intravenous New Bag/Given 04/13/18 1507)  ondansetron (ZOFRAN) injection 4 mg (4 mg Intravenous Given 04/13/18 1418)  sodium chloride 0.9 % bolus 1,000 mL (0 mLs Intravenous Stopped 04/13/18 1538)  fentaNYL (SUBLIMAZE) injection 25 mcg (25 mcg Intravenous Given 04/13/18 1503)     Initial Impression / Assessment and Plan / ED Course  I have reviewed the triage vital signs and the nursing notes.  Pertinent labs & imaging results that were available during my care of the patient were reviewed by me and considered in my medical decision making (see chart for details).    Presents with pain around her right upper quadrant, diarrhea for 1 month.  Patient had an ultrasound this morning done outpatient refer to by her PCP, she did not have anything to eat prior to the exam and began to feel dizzy, and diaphoretic. CBC showed no leukocytosis, hemoglobin is 13.8 stable. BMP showed hypokalemia, she does reports episodes of diarrhea for 1 month could be contributing her decrease in K+. LFT are slightly elevated but consistent with patient's previous visits. Lipase is stable, cbg 183. I personally called [facility] and has a radiologist read her US abdomen performed this morning. Results showed: 1. No acute or focal lesion to explain the patient's right upper quadrant pain. 2. The liver is diffusely echogenic, suggesting hepatic steatosis. 3. Cholelithiasis.  I have  discussed this results of the ultrasound with patient.  Due to her hypokalemia potassium replaced via oral and IV.  We will recheck prior to discharge. She received fentanyl for her pain and headache, patient does report that these headaches have been going on for a while, I personally reviewed her chart and see patient was seen last month with a similar headache.   Istat chem 8 showed patients potassium 3.0, will discharge patient with a prescription for potassium replacement for 1 week and follow up with PCP for recheck. Patient reports feeling much better. Return precautions provided.   Final Clinical Impressions(s) / ED Diagnoses   Final diagnoses:  Dizziness  Hypokalemia    ED Discharge Orders         Ordered    potassium chloride SA (K-DUR,KLOR-CON) 20 MEQ tablet  Daily     04/13/18 1835           Janeece Fitting, PA-C 04/13/18 1837    Daleen Bo, MD 04/13/18 2128

## 2018-04-13 NOTE — Discharge Instructions (Signed)
I have prescribed potassium replacement capsule due to your potassium being low during your visit. Please take one tablet daily for the next 7 days, schedule an appointment with your primary care  physician after completion of therapy to have your blood work rechecked.

## 2018-04-13 NOTE — ED Notes (Signed)
Date and time results received: 04/13/18 1333 (use smartphrase ".now" to insert current time)  Test: K+ Critical Value: 2.6  Name of Provider Notified: P.A. Johanna  Orders Received? Or Actions Taken?:

## 2018-04-13 NOTE — ED Triage Notes (Addendum)
Pt BIBA from work. Pt started having dizziness and nausea. Pt had an [place] this morning, and has had not had anything to eat to eat. Pt had [place] on liver in search of "liver stones". Pt also c/o pain in RUQ. Pt states she has also been having diarrhea for the past month, and had an episode for 15 minutes today. Pt describes breaking into a cold sweat.

## 2018-04-14 ENCOUNTER — Encounter: Payer: Self-pay | Admitting: Family Medicine

## 2018-04-14 NOTE — Progress Notes (Signed)
BP 126/76 (BP Location: Left Arm, Patient Position: Sitting, Cuff Size: Large)   Pulse 80   Temp 98.2 F (36.8 C) (Oral)   Ht 5\' 9"  (1.753 m)   Wt (!) 337 lb (152.9 kg)   SpO2 94%   BMI 49.77 kg/m    CC: ER f/u visit Subjective:    Patient ID: Cassandra [name], female    DOB: 05-25-1976, 42 y.o.   MRN: [id number]  HPI: Cassandra [name] is a 42 y.o. female presenting on 04/15/2018 for Hospitalization Follow-up (Seen at [facility] ED on 04/13/18.)   See prior notes for details.  Ongoing diarrhea with urgency over 1+ month (1-3 loose or watery stools per day) without blood associated with RUQ abdominal pain. Abd [place] earlier this week returned largely normal (fatty liver changes). Seen at ER 04/13/2018 with dizziness, found to have marked hypokalemia, repleted with oral and IV potassium. Ongoing sharp stabbing pain at RUQ. Bland diet has helped.  Ongoing nausea, cramping abd pain, and diarrhea. Cramping does improve after bowel movement. Mild GERD symptoms.   Last BM was 2d ago, increased flatus and bloating. No boring pain to the back. No prior h/o similar symptoms.  Still no blood in stool, no fevers/chills.  She started taking potassium oral last night.   Relevant past medical, surgical, family and social history reviewed and updated as indicated. Interim medical history since our last visit reviewed. Allergies and medications reviewed and updated. Outpatient Medications Prior to Visit  Medication Sig Dispense Refill  . acetaminophen (TYLENOL) 500 MG tablet Take 1,000 mg by mouth daily as needed.    Marland Kitchen amLODipine (NORVASC) 5 MG tablet Take 1 tablet (5 mg total) by mouth daily. (Patient taking differently: Take 5 mg by mouth at bedtime. ) 90 tablet 3  . BIKTARVY 50-200-25 MG TABS tablet 1 tablet at bedtime.    . Calcium Carb-Cholecalciferol (CALCIUM-VITAMIN D) 600-400 MG-UNIT TABS Take 1 tablet by mouth daily. 60 tablet   . hydrochlorothiazide (HYDRODIURIL) 25 MG tablet Take 1 tablet (25 mg  total) by mouth daily. (Patient taking differently: Take 25 mg by mouth at bedtime. ) 90 tablet 3  . ondansetron (ZOFRAN) 4 MG tablet Take 1 tablet (4 mg total) by mouth every 8 (eight) hours as needed for nausea or vomiting. 20 tablet 0  . potassium chloride SA (K-DUR,KLOR-CON) 20 MEQ tablet Take 1 tablet (20 mEq total) by mouth daily for 7 days. 7 tablet 0  . omeprazole (PRILOSEC) 40 MG capsule TAKE 1 CAPSULE BY MOUTH EVERY DAY (Patient taking differently: TAKE 1 CAPSULE BY MOUTH DAILY AS NEEDED QHS) 30 capsule 3  . sertraline (ZOLOFT) 100 MG tablet TAKE 1 TABLET BY MOUTH EVERY DAY 90 tablet 1   No facility-administered medications prior to visit.      Per HPI unless specifically indicated in ROS section below Review of Systems     Objective:    BP 126/76 (BP Location: Left Arm, Patient Position: Sitting, Cuff Size: Large)   Pulse 80   Temp 98.2 F (36.8 C) (Oral)   Ht 5\' 9"  (1.753 m)   Wt (!) 337 lb (152.9 kg)   SpO2 94%   BMI 49.77 kg/m   Wt Readings from Last 3 Encounters:  04/15/18 (!) 337 lb (152.9 kg)  04/13/18 (!) 339 lb (153.8 kg)  04/04/18 (!) 337 lb 4 oz (153 kg)    Physical Exam  Constitutional: She appears well-developed and well-nourished. No distress.  HENT:  Mouth/Throat: Oropharynx is clear  and moist. No oropharyngeal exudate.  Cardiovascular: Normal rate, regular rhythm and normal heart sounds.  No murmur heard. Pulmonary/Chest: Effort normal and breath sounds normal. No respiratory distress. She has no wheezes. She has no rales.  Abdominal: Soft. Bowel sounds are normal. She exhibits no distension and no mass. There is no hepatosplenomegaly. There is tenderness (moderate) in the right upper quadrant and left upper quadrant. There is guarding (mild). There is no rigidity, no rebound, no CVA tenderness and negative Murphy's sign. No hernia.  Musculoskeletal: She exhibits no edema.  Psychiatric: She has a normal mood and affect.  Nursing note and vitals  reviewed.      Assessment & Plan:   Problem List Items Addressed This Visit    RUQ abdominal pain - Primary    Ongoing, s/p cholecystectomy 2018. abd [place] unrevealing. Given ongoing pain, diarrhea, will recommend further evaluation with CT abdomen, help r/o choledocholithiasis vs other cause of ongoing RUQ abd pain.       Relevant Orders   CT ABDOMEN W CONTRAST   MDD (major depressive disorder), recurrent episode, moderate ([place])    Change SSRI from zoloft to celexa in ongoing diarrhea.       Relevant Medications   citalopram (CELEXA) 20 MG tablet   Hypokalemia    Started oral replacement last night.       Chronic diarrhea    Ongoing. Slowed down last few days. Bland diet has helped. No h/o IBS. No blood in stool. Will also send off GI pathogen panel for ongoing diarrhea.  Will also change SSRIs in case zoloft contributing.       Relevant Orders   CT ABDOMEN W CONTRAST   Gastrointestinal Pathogen Panel PCR       Meds ordered this encounter  Medications  . citalopram (CELEXA) 20 MG tablet    Sig: Take 1 tablet (20 mg total) by mouth daily.    Dispense:  30 tablet    Refill:  6  . pantoprazole (PROTONIX) 40 MG tablet    Sig: Take 1 tablet (40 mg total) by mouth daily.    Dispense:  30 tablet    Refill:  3   Orders Placed This Encounter  Procedures  . CT ABDOMEN W CONTRAST    Standing Status:   Future    Standing Expiration Date:   07/17/2019    Order Specific Question:   ** REASON FOR EXAM (FREE TEXT)    Answer:   upper abdominal pain, diarrhea, nausea    Order Specific Question:   If indicated for the ordered procedure, I authorize the administration of contrast media per Radiology protocol    Answer:   Yes    Order Specific Question:   Is patient pregnant?    Answer:   No    Order Specific Question:   Preferred imaging location?    Answer:   [facility]    Order Specific Question:   Is Oral Contrast requested for this exam?    Answer:   Yes, Per Radiology  protocol    Order Specific Question:   Radiology Contrast Protocol - do NOT remove file path    Answer:   [email or web address]  . Gastrointestinal Pathogen Panel PCR    Follow up plan: No follow-ups on file.  Ria Bush, MD

## 2018-04-15 ENCOUNTER — Ambulatory Visit
Admission: RE | Admit: 2018-04-15 | Discharge: 2018-04-15 | Disposition: A | Discharge status: Home / Self Care | Payer: 59 | Source: Ambulatory Visit | Attending: Family Medicine | Admitting: Family Medicine

## 2018-04-15 ENCOUNTER — Telehealth: Payer: Self-pay | Admitting: *Deleted

## 2018-04-15 ENCOUNTER — Other Ambulatory Visit: Payer: Self-pay | Admitting: Family Medicine

## 2018-04-15 ENCOUNTER — Encounter: Payer: Self-pay | Admitting: Family Medicine

## 2018-04-15 ENCOUNTER — Ambulatory Visit (INDEPENDENT_AMBULATORY_CARE_PROVIDER_SITE_OTHER): Payer: 59 | Admitting: Family Medicine

## 2018-04-15 VITALS — BP 126/76 | HR 80 | Temp 98.2°F | Ht 69.0 in | Wt 337.0 lb

## 2018-04-15 DIAGNOSIS — R932 Abnormal findings on diagnostic imaging of liver and biliary tract: Secondary | ICD-10-CM | POA: Diagnosis not present

## 2018-04-15 DIAGNOSIS — R1011 Right upper quadrant pain: Secondary | ICD-10-CM

## 2018-04-15 DIAGNOSIS — E876 Hypokalemia: Secondary | ICD-10-CM | POA: Insufficient documentation

## 2018-04-15 DIAGNOSIS — F331 Major depressive disorder, recurrent, moderate: Secondary | ICD-10-CM | POA: Diagnosis not present

## 2018-04-15 DIAGNOSIS — N200 Calculus of kidney: Secondary | ICD-10-CM | POA: Diagnosis not present

## 2018-04-15 DIAGNOSIS — K529 Noninfective gastroenteritis and colitis, unspecified: Secondary | ICD-10-CM

## 2018-04-15 DIAGNOSIS — K760 Fatty (change of) liver, not elsewhere classified: Secondary | ICD-10-CM | POA: Insufficient documentation

## 2018-04-15 MED ORDER — CITALOPRAM HYDROBROMIDE 20 MG PO TABS: 20.0000 mg | ORAL_TABLET | Freq: Every day | ORAL | 6 refills | Status: DC

## 2018-04-15 MED ORDER — PANTOPRAZOLE SODIUM 40 MG PO TBEC: 40.0000 mg | DELAYED_RELEASE_TABLET | Freq: Every day | ORAL | 3 refills | Status: DC

## 2018-04-15 MED ORDER — IOPAMIDOL (ISOVUE-300) INJECTION 61%
125.0000 mL | Freq: Once | INTRAVENOUS | Status: AC | PRN
  Administered 2018-04-15: 125 mL via INTRAVENOUS

## 2018-04-15 NOTE — Telephone Encounter (Signed)
Juliann Pulse with CT contacted office and states pts reports it is available in EMR.

## 2018-04-15 NOTE — Assessment & Plan Note (Addendum)
Ongoing. Slowed down last few days. Bland diet has helped. No h/o IBS. No blood in stool. Will also send off GI pathogen panel for ongoing diarrhea.  Will also change SSRIs in case zoloft contributing.

## 2018-04-15 NOTE — Telephone Encounter (Signed)
See report

## 2018-04-15 NOTE — Assessment & Plan Note (Signed)
Ongoing, s/p cholecystectomy 2018. abd [place] unrevealing. Given ongoing pain, diarrhea, will recommend further evaluation with CT abdomen, help r/o choledocholithiasis vs other cause of ongoing RUQ abd pain.

## 2018-04-15 NOTE — Assessment & Plan Note (Signed)
Change SSRI from zoloft to celexa in ongoing diarrhea.

## 2018-04-15 NOTE — Patient Instructions (Addendum)
Change zoloft to celexa 20mg  daily - new medicine at pharmacy Change omeprazole to pantoprazole as needed.  Continue bland diet.  Pass by Marion's office to schedule CT scan for ongoing abdominal pain. We may refer you to GI pending results

## 2018-04-15 NOTE — Assessment & Plan Note (Signed)
Started oral replacement last night.

## 2018-04-18 ENCOUNTER — Telehealth: Payer: Self-pay | Admitting: Family Medicine

## 2018-04-18 NOTE — Telephone Encounter (Signed)
fmla paperwork in dr g in box Pt stated she was out of work 11/6 and 11/8

## 2018-04-18 NOTE — Addendum Note (Signed)
Addended by: Ellamae Sia on: 04/18/2018 02:31 PM   Modules accepted: Orders

## 2018-04-18 NOTE — Telephone Encounter (Signed)
Left message letting pt know paperwork was ready for pick up Copy for pt Copy for scan

## 2018-04-18 NOTE — Telephone Encounter (Signed)
Forms filled and placed in my out box. Thank you.

## 2018-04-19 NOTE — Telephone Encounter (Signed)
Referral already placed.

## 2018-04-25 ENCOUNTER — Other Ambulatory Visit (INDEPENDENT_AMBULATORY_CARE_PROVIDER_SITE_OTHER): Payer: 59

## 2018-04-25 DIAGNOSIS — K529 Noninfective gastroenteritis and colitis, unspecified: Secondary | ICD-10-CM

## 2018-04-25 DIAGNOSIS — R1011 Right upper quadrant pain: Secondary | ICD-10-CM

## 2018-04-26 LAB — GASTROINTESTINAL PATHOGEN PANEL PCR
C. difficile Tox A/B, PCR: NOT DETECTED
Campylobacter, PCR: NOT DETECTED
Cryptosporidium, PCR: NOT DETECTED
E coli (ETEC) LT/ST PCR: NOT DETECTED
E coli (STEC) stx1/stx2, PCR: NOT DETECTED
E coli 0157, PCR: NOT DETECTED
Giardia lamblia, PCR: NOT DETECTED
Norovirus, PCR: NOT DETECTED
Rotavirus A, PCR: NOT DETECTED
Salmonella, PCR: NOT DETECTED
Shigella, PCR: NOT DETECTED

## 2018-04-27 ENCOUNTER — Ambulatory Visit (INDEPENDENT_AMBULATORY_CARE_PROVIDER_SITE_OTHER): Payer: 59 | Admitting: Gastroenterology

## 2018-04-27 ENCOUNTER — Encounter: Payer: Self-pay | Admitting: Gastroenterology

## 2018-04-27 ENCOUNTER — Other Ambulatory Visit (INDEPENDENT_AMBULATORY_CARE_PROVIDER_SITE_OTHER): Payer: 59

## 2018-04-27 VITALS — BP 118/70 | HR 116 | Ht 67.72 in | Wt 338.5 lb

## 2018-04-27 DIAGNOSIS — R1011 Right upper quadrant pain: Secondary | ICD-10-CM | POA: Diagnosis not present

## 2018-04-27 DIAGNOSIS — Z9049 Acquired absence of other specified parts of digestive tract: Secondary | ICD-10-CM

## 2018-04-27 DIAGNOSIS — R143 Flatulence: Secondary | ICD-10-CM

## 2018-04-27 DIAGNOSIS — R142 Eructation: Secondary | ICD-10-CM

## 2018-04-27 DIAGNOSIS — R197 Diarrhea, unspecified: Secondary | ICD-10-CM | POA: Diagnosis not present

## 2018-04-27 DIAGNOSIS — R141 Gas pain: Secondary | ICD-10-CM

## 2018-04-27 LAB — C-REACTIVE PROTEIN: CRP: 4.7 mg/dL (ref 0.5–20.0)

## 2018-04-27 LAB — IGA: IgA: 223 mg/dL (ref 68–378)

## 2018-04-27 LAB — SEDIMENTATION RATE: Sed Rate: 60 mm/hr — ABNORMAL HIGH (ref 0–20)

## 2018-04-27 MED ORDER — PANTOPRAZOLE SODIUM 40 MG PO TBEC: 40.0000 mg | DELAYED_RELEASE_TABLET | Freq: Two times a day (BID) | ORAL | 3 refills | Status: DC

## 2018-04-27 MED ORDER — RIFAXIMIN 550 MG PO TABS: 550.0000 mg | ORAL_TABLET | Freq: Three times a day (TID) | ORAL | 0 refills | Status: DC

## 2018-04-27 NOTE — Patient Instructions (Signed)
Your provider has requested that you go to the basement level for lab work before leaving today. Press "B" on the elevator. The lab is located at the first door on the left as you exit the elevator.  We have sent the following medications to your pharmacy for you to pick up at your convenience: Protonix 40 mg twice a day   We have sent your demographic information and a prescription for Xifaxan to [facility]. This pharmacy is able to get medication approved through insurance and get you the lowest copay possible. If you have not heard from them within 1 week, please call our office at [phone] to let us know.

## 2018-04-27 NOTE — Progress Notes (Signed)
Referring Provider: Ria Bush, MD Primary Care Physician:  Ria Bush, MD   Reason for Consultation:  Abdominal pain and diarrhea   IMPRESSION:  RUQ abdominal pain Gas, eructation, and flatus Post-prandial diarrhea BMI of 52 Hepatic steatosis by ultrasound and CT Cholecystectomy 2018 for choledocholithiasis  She is concerned about IBS and IBD. No extra-GI manifestations of IBD.  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 The history is obtained through the patient.   One month of postprandial nausea occurring within 30 minutes of eating.  Progressively worse. RUQ pain occurs after almost all PO intake but also occurs without eating. Postprandial defecation. No change with defecation although she slowly gets better. Lying down improves her symptoms. Some exacerbation with car ride. Sleeping provides great relief.  Blood on the stool on one occasion. No mucous. Zofran caused constipation. 2-3 BM watery but not explosive daily. Baseline is one formed BM.  Poor appetite. Lost 10 pounds over the last 4-6 weeks. She notes that she does not lose weight easily. Eructation and flatus are new during this time.  Eructation and flatus. No other associated symptoms. No identified exacerbating or relieving features.   Similar symptoms a few years ago that resolved on its own. Less pain and less urgency then.   Bilateral lower abdominal pain different from gallstone pain last year.   No prior endoscopy. No NSAIDs.  Friend with Crohn's disease and she is concerned that is the reason for her  symptoms.   Abdominal ultrasound 04/13/2018 for right upper quadrant pain showed an echogenic liver cholelithiasis, but was otherwise normal.  A CT of the abdomen 04/15/2018 showed hepatic steatosis, a small cyst measuring 8 mm that is unclear if it is in the liver or the adrenals.  Nonobstructing left renal calculus.  Past Medical History:  Diagnosis Date  . 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 Anxiety   . Depression   . DM (diabetes mellitus) ([place])   . Gallstones 06/2016   by xray - surgery to remove  . Generalized headaches    frequent  . GERD (gastroesophageal reflux disease)   . History of abnormal Pap smear    remote  . History of gestational diabetes    first 2 pregnancies  . 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[place])    CD4 level is 1100 per patient  . HSV-2 seropositive   . Hypertension   . Kidney stones   . Morbid obesity ([place])   . Periodontal disease 08/2011   currently getting dental work  . Tobacco use     Past Surgical History:  Procedure Laterality Date  . CESAREAN SECTION  [phone]   x2  . CHOLECYSTECTOMY  2018  . HYSTEROSCOPY W/D&C N/A 01/05/2018   Minerva for heavy bleeding, IUD removed - DILATATION AND CURETTAGE /HYSTEROSCOPY WITH MINERVA ABLATION; [name], MD  . [place]  . TUBAL LIGATION    . WISDOM TOOTH EXTRACTION     x 4    Current Outpatient Medications  Medication Sig  Dispense Refill  . acetaminophen (TYLENOL) 500 MG tablet Take 1,000 mg by mouth daily as needed.    Marland Kitchen amLODipine (NORVASC) 5 MG tablet Take 1 tablet (5 mg total) by mouth daily. (Patient taking differently: Take 5 mg by mouth at bedtime. ) 90 tablet 3  . BIKTARVY 50-200-25 MG TABS tablet 1 tablet at bedtime.    . Calcium Carb-Cholecalciferol (CALCIUM-VITAMIN D) 600-400 MG-UNIT TABS Take 1 tablet by mouth daily. 60 tablet   . citalopram (CELEXA) 20 MG tablet Take 1 tablet (20 mg total) by mouth daily. 30 tablet 6  . hydrochlorothiazide (HYDRODIURIL) 25  MG tablet Take 1 tablet (25 mg total) by mouth daily. (Patient taking differently: Take 25 mg by mouth at bedtime. ) 90 tablet 3  . ondansetron (ZOFRAN) 4 MG tablet Take 1 tablet (4 mg total) by mouth every 8 (eight) hours as needed for nausea or vomiting. 20 tablet 0  . pantoprazole (PROTONIX) 40 MG tablet Take 1 tablet (40 mg total) by mouth daily. 30 tablet 3   No current facility-administered medications for this visit.     Allergies as of 04/27/2018  . (No Known Allergies)    Family History  Problem Relation Age of Onset  . Hypertension Mother   . Hypertension Father   . Appendicitis Father   . Diabetes Maternal Grandmother        s/p amputation  . Hyperlipidemia Maternal Grandmother   . Hypertension Maternal Grandmother   . Stroke Maternal Grandmother   . Hyperlipidemia Maternal Grandfather   . Hypertension Maternal Grandfather   . Skin cancer Maternal Grandfather   . Seizures Paternal Grandmother 49       deceased  . Coronary artery disease Paternal Grandfather 61       MI  . Breast cancer Neg Hx     Social History   Socioeconomic History  . Marital status: Married    Spouse name: Not on file  . Number of children: 2  . Years of education: Not on file  . Highest education level: Not on file  Occupational History  . Occupation: CSR  Social Needs  . Financial resource strain: Not on file  . Food insecurity:    Worry: Not on file    Inability: Not on file  . Transportation needs:    Medical: Not on file    Non-medical: Not on file  Tobacco Use  . Smoking status: Current Some Day Smoker    Packs/day: 0.10    Years: 23.00    Pack years: 2.30    Types: Cigarettes    Last attempt to quit: 02/02/2018    Years since quitting: 0.2  . Smokeless tobacco: Never Used  . Tobacco comment: as of 04/27/18 working on quitting  Substance and Sexual Activity  . Alcohol use: Yes    Alcohol/week: 0.0 standard drinks    Comment: occasional  . Drug use: No  . Sexual  activity: Yes    Partners: Male    Birth control/protection: Surgical, IUD, Pill    Comment: Mirena IUD  Lifestyle  . Physical activity:    Days per week: Not on file    Minutes per session: Not on file  . Stress: Not on file  Relationships  . Social connections:    Talks on phone: Not on file    Gets together: Not on file    Attends religious service: Not on file    Active member of club or organization: Not on file  Attends meetings of clubs or organizations: Not on file    Relationship status: Not on file  . Intimate partner violence:    Fear of current or ex partner: Not on file    Emotionally abused: Not on file    Physically abused: Not on file    Forced sexual activity: Not on file  Other Topics Concern  . Not on file  Social History Narrative   Lives with husband and 2 children, no pets   Occupation: call center rep   Edu: some college   Activity: no regular exercise.  Tries to walk around building.   Diet: 1 mt dew in am, rest water, fruits/vegetables daily     Review of Systems: 12 system ROS is negative except as noted above.  Filed Weights   04/27/18 1417  Weight: (!) 338 lb 8 oz (153.5 kg)    Physical Exam: Vital signs were reviewed. General:   Alert, well-nourished, pleasant and cooperative in NAD Head:  Normocephalic and atraumatic. Eyes:  Sclera clear, no icterus.   Conjunctiva pink. Mouth:  No deformity or lesions.   Neck:  Supple; no thyromegaly. Lungs:  Clear throughout to auscultation.   No wheezes.  Heart:  Regular rate and rhythm; no murmurs Abdomen:  Central obesity, soft, nontender, normal bowel sounds. No rebound or guarding. No hepatosplenomegaly Rectal:  Deferred  Msk:  Symmetrical without gross deformities. Extremities:  No gross deformities or edema. Neurologic:  Alert and  oriented x4;  grossly nonfocal Skin:  No rash or bruise. Psych:  Alert and cooperative. Normal mood and affect.   Iqra Rotundo L. Tarri Glenn Md, MPH [facility] 04/27/2018, 2:47 PM

## 2018-04-28 LAB — TISSUE TRANSGLUTAMINASE, IGA: (tTG) Ab, IgA: 1 U/mL

## 2018-04-29 ENCOUNTER — Other Ambulatory Visit: Payer: 59

## 2018-04-29 DIAGNOSIS — R143 Flatulence: Secondary | ICD-10-CM

## 2018-04-29 DIAGNOSIS — R142 Eructation: Secondary | ICD-10-CM

## 2018-04-29 DIAGNOSIS — R197 Diarrhea, unspecified: Secondary | ICD-10-CM

## 2018-04-29 DIAGNOSIS — Z9049 Acquired absence of other specified parts of digestive tract: Secondary | ICD-10-CM

## 2018-04-29 DIAGNOSIS — R1011 Right upper quadrant pain: Secondary | ICD-10-CM

## 2018-04-29 DIAGNOSIS — R141 Gas pain: Secondary | ICD-10-CM

## 2018-05-07 ENCOUNTER — Other Ambulatory Visit: Payer: Self-pay | Admitting: Family Medicine

## 2018-05-08 LAB — CALPROTECTIN, FECAL: Calprotectin, Fecal: 153 ug/g — ABNORMAL HIGH (ref 0–120)

## 2018-05-16 ENCOUNTER — Telehealth: Payer: Self-pay | Admitting: *Deleted

## 2018-05-16 NOTE — Telephone Encounter (Signed)
Cassandra [name],  This pt's BMI is 51.9 and she is scheduled in the [place].   Can we schedule her at [facility] ?  Or do we need to ask [place].  She was seen in the office 04-27-2018.  Thanks, Lelan Pons

## 2018-05-16 NOTE — Telephone Encounter (Signed)
Left message for patient to call back  

## 2018-05-20 ENCOUNTER — Other Ambulatory Visit: Payer: Self-pay

## 2018-05-20 DIAGNOSIS — R197 Diarrhea, unspecified: Secondary | ICD-10-CM

## 2018-05-20 DIAGNOSIS — R142 Eructation: Secondary | ICD-10-CM

## 2018-05-20 DIAGNOSIS — R141 Gas pain: Secondary | ICD-10-CM

## 2018-05-20 DIAGNOSIS — R1011 Right upper quadrant pain: Secondary | ICD-10-CM

## 2018-05-20 DIAGNOSIS — R143 Flatulence: Secondary | ICD-10-CM

## 2018-05-20 NOTE — Telephone Encounter (Signed)
Left message for patient to call back  

## 2018-05-20 NOTE — Telephone Encounter (Signed)
She has been rescheduled to 07/04/18 at [facility] 12:30. She will keep her pre-visit for 05/27/18.  She will bring by some paperwork for work for work accommodations.

## 2018-05-27 ENCOUNTER — Ambulatory Visit: Payer: Self-pay | Admitting: Gastroenterology

## 2018-05-27 ENCOUNTER — Ambulatory Visit (AMBULATORY_SURGERY_CENTER): Payer: Self-pay | Admitting: *Deleted

## 2018-05-27 VITALS — Ht 67.0 in | Wt 343.0 lb

## 2018-05-27 DIAGNOSIS — R109 Unspecified abdominal pain: Secondary | ICD-10-CM

## 2018-05-27 DIAGNOSIS — R197 Diarrhea, unspecified: Secondary | ICD-10-CM

## 2018-05-27 MED ORDER — NA SULFATE-K SULFATE-MG SULF 17.5-3.13-1.6 GM/177ML PO SOLN: 1.0000 | Freq: Once | ORAL | 0 refills | Status: AC

## 2018-05-27 NOTE — Progress Notes (Signed)
No egg or soy allergy known to patient  No issues with past sedation with any surgeries  or procedures, no intubation problems  No diet pills per patient No home 02 use per patient  No blood thinners per patient  Pt denies issues with constipation  No A fib or A flutter  EMMI video sent to pt's e mail  

## 2018-06-03 ENCOUNTER — Telehealth: Payer: Self-pay

## 2018-06-03 NOTE — Telephone Encounter (Signed)
Pt said for 1 wk had stabbing pains on lt side at base of ribs. Now dull pain aching that started on 06/02/18.pain comes and goes. No vomiting, no fever,no SOB and no dizziness. Pt said slight nausea, on and off loose stools since Nov 2019. Pt said she has a kidney stone on the lt now but thinks area of pain is too high. Pt also having H/A. Pt said she is on her way home to lay down; pt did not want to schedule appt for Sat clinic now she is going to see how she feels and if pain worsens at night will go to [facility] or ED. If pain Sat morning will call early for appt at Sat clinic and if still pain and has not been seen over weekend will call Monday morning for appt. FYI to Dr Darnell Level.

## 2018-06-03 NOTE — Telephone Encounter (Signed)
Noted! Thank you

## 2018-06-16 ENCOUNTER — Encounter: Payer: Self-pay | Admitting: Gastroenterology

## 2018-06-21 ENCOUNTER — Encounter: Payer: Self-pay | Admitting: Radiology

## 2018-06-26 ENCOUNTER — Other Ambulatory Visit: Payer: Self-pay | Admitting: Family Medicine

## 2018-07-04 ENCOUNTER — Ambulatory Visit (HOSPITAL_COMMUNITY)
Admission: RE | Admit: 2018-07-04 | Discharge: 2018-07-04 | Disposition: A | Discharge status: Home / Self Care | Payer: 59 | Source: Ambulatory Visit | Attending: Gastroenterology | Admitting: Gastroenterology

## 2018-07-04 ENCOUNTER — Encounter (HOSPITAL_COMMUNITY)
Admission: RE | Disposition: A | Discharge status: Home / Self Care | Payer: Self-pay | Source: Ambulatory Visit | Attending: Gastroenterology

## 2018-07-04 ENCOUNTER — Ambulatory Visit (HOSPITAL_COMMUNITY): Payer: 59 | Admitting: Certified Registered"

## 2018-07-04 ENCOUNTER — Encounter (HOSPITAL_COMMUNITY): Payer: Self-pay | Admitting: Certified Registered"

## 2018-07-04 ENCOUNTER — Other Ambulatory Visit: Payer: Self-pay

## 2018-07-04 DIAGNOSIS — R143 Flatulence: Secondary | ICD-10-CM | POA: Insufficient documentation

## 2018-07-04 DIAGNOSIS — Z79899 Other long term (current) drug therapy: Secondary | ICD-10-CM | POA: Insufficient documentation

## 2018-07-04 DIAGNOSIS — F419 Anxiety disorder, unspecified: Secondary | ICD-10-CM | POA: Insufficient documentation

## 2018-07-04 DIAGNOSIS — F17210 Nicotine dependence, cigarettes, uncomplicated: Secondary | ICD-10-CM | POA: Diagnosis not present

## 2018-07-04 DIAGNOSIS — R142 Eructation: Secondary | ICD-10-CM | POA: Insufficient documentation

## 2018-07-04 DIAGNOSIS — R197 Diarrhea, unspecified: Secondary | ICD-10-CM | POA: Diagnosis not present

## 2018-07-04 DIAGNOSIS — Z6841 Body Mass Index (BMI) 40.0 and over, adult: Secondary | ICD-10-CM | POA: Insufficient documentation

## 2018-07-04 DIAGNOSIS — R141 Gas pain: Secondary | ICD-10-CM

## 2018-07-04 DIAGNOSIS — Z21 Asymptomatic human immunodeficiency virus [HIV] infection status: Secondary | ICD-10-CM | POA: Diagnosis not present

## 2018-07-04 DIAGNOSIS — K760 Fatty (change of) liver, not elsewhere classified: Secondary | ICD-10-CM | POA: Insufficient documentation

## 2018-07-04 DIAGNOSIS — I10 Essential (primary) hypertension: Secondary | ICD-10-CM | POA: Insufficient documentation

## 2018-07-04 DIAGNOSIS — D124 Benign neoplasm of descending colon: Secondary | ICD-10-CM | POA: Diagnosis not present

## 2018-07-04 DIAGNOSIS — Z1211 Encounter for screening for malignant neoplasm of colon: Secondary | ICD-10-CM

## 2018-07-04 DIAGNOSIS — K219 Gastro-esophageal reflux disease without esophagitis: Secondary | ICD-10-CM | POA: Diagnosis not present

## 2018-07-04 DIAGNOSIS — K635 Polyp of colon: Secondary | ICD-10-CM

## 2018-07-04 DIAGNOSIS — Z9049 Acquired absence of other specified parts of digestive tract: Secondary | ICD-10-CM | POA: Insufficient documentation

## 2018-07-04 DIAGNOSIS — R1011 Right upper quadrant pain: Secondary | ICD-10-CM | POA: Diagnosis present

## 2018-07-04 DIAGNOSIS — F329 Major depressive disorder, single episode, unspecified: Secondary | ICD-10-CM | POA: Diagnosis not present

## 2018-07-04 DIAGNOSIS — D122 Benign neoplasm of ascending colon: Secondary | ICD-10-CM | POA: Insufficient documentation

## 2018-07-04 DIAGNOSIS — K5730 Diverticulosis of large intestine without perforation or abscess without bleeding: Secondary | ICD-10-CM | POA: Insufficient documentation

## 2018-07-04 DIAGNOSIS — Z860101 Personal history of adenomatous and serrated colon polyps: Secondary | ICD-10-CM

## 2018-07-04 HISTORY — PX: BIOPSY: SHX5522

## 2018-07-04 HISTORY — PX: COLONOSCOPY WITH PROPOFOL: SHX5780

## 2018-07-04 HISTORY — PX: POLYPECTOMY: SHX5525

## 2018-07-04 LAB — POCT I-STAT 4, (NA,K, GLUC, HGB,HCT)
Glucose, Bld: 132 mg/dL — ABNORMAL HIGH (ref 70–99)
HCT: 42 % (ref 36.0–46.0)
Hemoglobin: 14.3 g/dL (ref 12.0–15.0)
Potassium: 3.7 mmol/L (ref 3.5–5.1)
Sodium: 139 mmol/L (ref 135–145)

## 2018-07-04 SURGERY — COLONOSCOPY WITH PROPOFOL
Anesthesia: Monitor Anesthesia Care

## 2018-07-04 MED ORDER — PROPOFOL 10 MG/ML IV BOLUS
INTRAVENOUS | Status: AC
  Filled 2018-07-04: qty 20

## 2018-07-04 MED ORDER — PROPOFOL 10 MG/ML IV BOLUS
INTRAVENOUS | Status: DC | PRN
  Administered 2018-07-04 (×2): 20 mg via INTRAVENOUS
  Administered 2018-07-04: 10 mg via INTRAVENOUS

## 2018-07-04 MED ORDER — PROPOFOL 500 MG/50ML IV EMUL
INTRAVENOUS | Status: DC | PRN
  Administered 2018-07-04: 125 ug/kg/min via INTRAVENOUS

## 2018-07-04 MED ORDER — LACTATED RINGERS IV SOLN
INTRAVENOUS | Status: DC
  Administered 2018-07-04: 12:00:00 via INTRAVENOUS

## 2018-07-04 MED ORDER — PROPOFOL 10 MG/ML IV BOLUS
INTRAVENOUS | Status: AC
  Filled 2018-07-04: qty 40

## 2018-07-04 MED ORDER — SODIUM CHLORIDE 0.9 % IV SOLN: INTRAVENOUS | Status: DC

## 2018-07-04 MED ORDER — LIDOCAINE 2% (20 MG/ML) 5 ML SYRINGE
INTRAMUSCULAR | Status: DC | PRN
  Administered 2018-07-04: 100 mg via INTRAVENOUS

## 2018-07-04 SURGICAL SUPPLY — 21 items

## 2018-07-04 NOTE — Transfer of Care (Signed)
Immediate Anesthesia Transfer of Care Note  Patient: RAYYA YAGI  Procedure(s) Performed: COLONOSCOPY WITH PROPOFOL (N/A ) BIOPSY POLYPECTOMY  Patient Location: PACU  Anesthesia Type:MAC  Level of Consciousness: awake, alert  and oriented  Airway & Oxygen Therapy: Patient Spontanous Breathing and Patient connected to face mask oxygen  Post-op Assessment: Report given to RN and Post -op Vital signs reviewed and stable  Post vital signs: Reviewed and stable  Last Vitals:  Vitals Value Taken Time  BP    Temp    Pulse 82 07/04/2018 12:19 PM  Resp 20 07/04/2018 12:19 PM  SpO2 97 % 07/04/2018 12:19 PM  Vitals shown include unvalidated device data.  Last Pain:  Vitals:   07/04/18 1130  TempSrc: Oral  PainSc: 0-No pain         Complications: No apparent anesthesia complications

## 2018-07-04 NOTE — Discharge Instructions (Signed)

## 2018-07-04 NOTE — Anesthesia Preprocedure Evaluation (Addendum)
Anesthesia Evaluation  Patient identified by MRN, date of birth, ID band Patient awake    Reviewed: Allergy & Precautions, NPO status , Patient's Chart, lab work & pertinent test results  History of Anesthesia Complications Negative for: history of anesthetic complications  Airway Mallampati: II  TM Distance: >3 FB Neck ROM: Full    Dental  (+) Teeth Intact, Dental Advisory Given   Pulmonary Current Smoker,    Pulmonary exam normal breath sounds clear to auscultation       Cardiovascular hypertension, Pt. on medications Normal cardiovascular exam Rhythm:Regular Rate:Normal     Neuro/Psych  Headaches, Anxiety Depression negative psych ROS   GI/Hepatic Neg liver ROS, GERD  Medicated and Controlled,  Endo/Other  Morbid obesity  Renal/GU negative Renal ROS  negative genitourinary   Musculoskeletal negative musculoskeletal ROS (+)   Abdominal   Peds negative pediatric ROS (+)  Hematology  (+) HIV,   Anesthesia Other Findings   Reproductive/Obstetrics negative OB ROS                             Anesthesia Physical Anesthesia Plan  ASA: III  Anesthesia Plan: MAC   Post-op Pain Management:    Induction:   PONV Risk Score and Plan: Treatment may vary due to age or medical condition and Propofol infusion  Airway Management Planned: Natural Airway and Nasal Cannula  Additional Equipment:   Intra-op Plan:   Post-operative Plan:   Informed Consent: I have reviewed the patients History and Physical, chart, labs and discussed the procedure including the risks, benefits and alternatives for the proposed anesthesia with the patient or authorized representative who has indicated his/her understanding and acceptance.     Dental advisory given  Plan Discussed with: CRNA  Anesthesia Plan Comments: (Hx of hypokalemia with last K 3.0. Will get iStat prior to procedure.)        Anesthesia Quick Evaluation

## 2018-07-04 NOTE — Anesthesia Postprocedure Evaluation (Signed)
Anesthesia Post Note  Patient: Cassandra [name]  Procedure(s) Performed: COLONOSCOPY WITH PROPOFOL (N/A ) BIOPSY POLYPECTOMY     Patient location during evaluation: PACU Anesthesia Type: MAC Level of consciousness: awake and alert Pain management: pain level controlled Vital Signs Assessment: post-procedure vital signs reviewed and stable Respiratory status: spontaneous breathing, nonlabored ventilation and respiratory function stable Cardiovascular status: blood pressure returned to baseline and stable Postop Assessment: no apparent nausea or vomiting Anesthetic complications: no    Last Vitals:  Vitals:   07/04/18 1225 07/04/18 1230  BP:    Pulse:    Resp:    Temp:    SpO2: 99% 98%    Last Pain:  Vitals:   07/04/18 1220  TempSrc:   PainSc: [place]

## 2018-07-04 NOTE — H&P (Signed)
Referring Provider: No ref. provider found Primary Care Physician:  Ria Bush, MD   Reason for Consultation:  Abdominal pain and diarrhea   IMPRESSION:  RUQ abdominal pain Gas, eructation, and flatus Post-prandial diarrhea BMI of 52 Hepatic steatosis by ultrasound and CT Cholecystectomy 2018 for choledocholithiasis  She is concerned about IBS and IBD. No extra-GI manifestations of IBD. However, recent labs show fecal calprotectin of 153, ESR 60. CRP was normal at 4.7.   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 The history is obtained through the patient.   One month of postprandial nausea occurring within 30 minutes of eating.  Progressively worse. RUQ pain occurs after almost all PO intake but also occurs without eating. Postprandial defecation. No change with defecation although she slowly gets better. Lying down improves her symptoms. Some exacerbation with car ride. Sleeping provides great relief.  Blood on the stool on one occasion. No mucous. Zofran caused constipation. 2-3 BM watery but not explosive daily. Baseline is one formed BM.  Poor appetite. Lost 10 pounds over the last 4-6 weeks. She notes that she does not lose weight easily. Eructation and flatus are new during this time.  Eructation and flatus. No other associated symptoms. No identified exacerbating or relieving features.   Similar symptoms a few years ago that resolved on its own. Less pain and less urgency then.   Bilateral lower abdominal pain different from gallstone pain last year.   No prior endoscopy. No NSAIDs.  Friend with Crohn's disease and she is concerned that is the reason for her symptoms.   Abdominal ultrasound 04/13/2018 for right upper quadrant pain showed an echogenic liver cholelithiasis, but was otherwise normal.  A CT of the abdomen 04/15/2018  showed hepatic steatosis, a small cyst measuring 8 mm that is unclear if it is in the liver or the adrenals.  Nonobstructing left renal calculus.  No extra-GI manifestations of IBD. However, recent labs in November 2019 show fecal calprotectin of 153, ESR 60. CRP was normal at 4.7.    Past Medical History:  Diagnosis Date  . Allergy   . 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 Anxiety   . Constipation    alternating from constipation to diarrhea  . Depression   . Gallstones 06/2016   by xray - surgery to remove  . Generalized headaches    frequent  . GERD (gastroesophageal reflux disease)   . History of abnormal Pap smear    remote  . History of gestational diabetes    first 2 pregnancies  . 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[place])    CD4 level is 1100 per patient  . HSV-2 seropositive   . Hypertension   . Kidney stones   . Morbid obesity ([place])   . Periodontal disease 08/2011   currently getting dental work  . Tobacco use     Past Surgical History:  Procedure Laterality Date  . CESAREAN SECTION  [phone]   x2  . CHOLECYSTECTOMY  2018  . HYSTEROSCOPY W/D&C N/A 01/05/2018   Minerva for heavy bleeding, IUD removed - DILATATION AND CURETTAGE /HYSTEROSCOPY WITH MINERVA ABLATION; [name], MD  . [place]  . TUBAL LIGATION    . WISDOM TOOTH EXTRACTION     x 4    Current Facility-Administered Medications  Medication Dose Route Frequency  Provider Last Rate Last Dose  . 0.9 %  sodium chloride infusion   Intravenous Continuous [place], MD      . lactated ringers infusion   Intravenous Continuous [place], MD        Allergies as of 05/20/2018  . (No Known Allergies)    Family History  Problem Relation Age of Onset  . Hypertension Mother   . Hypertension Father   . Appendicitis Father   . Diabetes Maternal Grandmother        s/p amputation  . Hyperlipidemia Maternal Grandmother   . Hypertension Maternal Grandmother   .  Stroke Maternal Grandmother   . Hyperlipidemia Maternal Grandfather   . Hypertension Maternal Grandfather   . Skin cancer Maternal Grandfather   . Seizures Paternal Grandmother 47       deceased  . Coronary artery disease Paternal Grandfather 62       MI  . Breast cancer Neg Hx   . Colon cancer Neg Hx   . Colon polyps Neg Hx   . Esophageal cancer Neg Hx   . Rectal cancer Neg Hx   . Stomach cancer Neg Hx     Social History   Socioeconomic History  . Marital status: Married    Spouse name: Not on file  . Number of children: 2  . Years of education: Not on file  . Highest education level: Not on file  Occupational History  . Occupation: CSR  Social Needs  . Financial resource strain: Not on file  . Food insecurity:    Worry: Not on file    Inability: Not on file  . Transportation needs:    Medical: Not on file    Non-medical: Not on file  Tobacco Use  . Smoking status: Current Some Day Smoker    Packs/day: 0.10    Years: 23.00    Pack years: 2.30    Types: Cigarettes    Last attempt to quit: 02/02/2018    Years since quitting: 0.4  . Smokeless tobacco: Never Used  . Tobacco comment: as of 04/27/18 working on quitting  Substance and Sexual Activity  . Alcohol use: Yes    Alcohol/week: 0.0 standard drinks    Comment: occasional  . Drug use: No  . Sexual activity: Yes    Partners: Male    Birth control/protection: Surgical, I.U.D., Pill    Comment: Mirena IUD  Lifestyle  . Physical activity:    Days per week: Not on file    Minutes per session: Not on file  . Stress: Not on file  Relationships  . Social connections:    Talks on phone: Not on file    Gets together: Not on file    Attends religious service: Not on file    Active member of club or organization: Not on file    Attends meetings of clubs or organizations: Not on file    Relationship status: Not on file  . Intimate partner violence:    Fear of current or ex partner: Not on file    Emotionally  abused: Not on file    Physically abused: Not on file    Forced sexual activity: Not on file  Other Topics Concern  . Not on file  Social History Narrative   Lives with husband and 2 children, no pets   Occupation: call center rep   Edu: some college   Activity: no regular exercise.  Tries to walk around building.   Diet: 1 mt  dew in am, rest water, fruits/vegetables daily     Review of Systems: 12 system ROS is negative except as noted above.  There were no vitals filed for this visit.  Physical Exam: Vital signs were reviewed. General:   Alert, well-nourished, pleasant and cooperative in NAD Head:  Normocephalic and atraumatic. Eyes:  Sclera clear, no icterus.   Conjunctiva pink. Mouth:  No deformity or lesions.   Neck:  Supple; no thyromegaly. Lungs:  Clear throughout to auscultation.   No wheezes.  Heart:  Regular rate and rhythm; no murmurs Abdomen:  Central obesity, soft, nontender, normal bowel sounds. No rebound or guarding. No hepatosplenomegaly Rectal:  Deferred  Msk:  Symmetrical without gross deformities. Extremities:  No gross deformities or edema. Neurologic:  Alert and  oriented x4;  grossly nonfocal Skin:  No rash or bruise. Psych:  Alert and cooperative. Normal mood and affect.   Gildardo Tickner L. Tarri Glenn Md, MPH [facility] 07/04/2018, 11:32 AM

## 2018-07-04 NOTE — Op Note (Signed)
[facility] Patient Name: Cassandra [name] Procedure Date: 07/04/2018 MRN: [id number] Attending MD: Thornton Park MD, MD Date of Birth: 07-11-1975 CSN: [id number] Age: 43 Admit Type: Outpatient Procedure:                Colonoscopy Indications:               Providers:                Thornton Park MD, MD, Raynelle Bring, RN, Carlyn Reichert, RN, Charolette Child, Technician, [facility], CRNA Referring MD:              Medicines:                 Complications:            No immediate complications. Estimated blood loss:                            Minimal. Estimated Blood Loss:     Estimated blood loss was minimal. Procedure:                Pre-Anesthesia Assessment:                           - Prior to the procedure, a History and Physical                            was performed, and patient medications and                            allergies were reviewed. The patient's tolerance of                            previous anesthesia was also reviewed. The risks                            and benefits of the procedure and the sedation                            options and risks were discussed with the patient.                            All questions were answered, and informed consent                            was obtained. Prior Anticoagulants: The patient has                            taken no previous anticoagulant or antiplatelet                            agents. ASA Grade Assessment: III - A patient with  severe systemic disease. After reviewing the risks                            and benefits, the patient was deemed in                            satisfactory condition to undergo the procedure.                           - Prior to the procedure, a History and Physical                            was performed, and patient medications and                            allergies  were reviewed. The patient's tolerance of                            previous anesthesia was also reviewed. The risks                            and benefits of the procedure and the sedation                            options and risks were discussed with the patient.                            All questions were answered, and informed consent                            was obtained. Prior Anticoagulants: The patient has                            taken no previous anticoagulant or antiplatelet                            agents. ASA Grade Assessment: III - A patient with                            severe systemic disease. After reviewing the risks                            and benefits, the patient was deemed in                            satisfactory condition to undergo the procedure.                           After obtaining informed consent, the colonoscope                            was passed under direct vision. Throughout the  procedure, the patient's blood pressure, pulse, and                            oxygen saturations were monitored continuously. The                            CF-HQ190L ([id number]) Olympus colonoscope was                            introduced through the anus and advanced to the the                            terminal ileum, with identification of the                            appendiceal orifice and IC valve. The colonoscopy                            was performed without difficulty. The patient                            tolerated the procedure well. The quality of the                            bowel preparation was good. Scope In: 11:52:50 AM Scope Out: 12:10:40 PM Scope Withdrawal Time: 0 hours 11 minutes 33 seconds  Total Procedure Duration: 0 hours 17 minutes 50 seconds  Findings:      The perianal and digital rectal examinations were normal.      A few small-mouthed diverticula were found in the sigmoid colon and        descending colon.      Two sessile polyps were found in the descending colon and ascending       colon. The polyps were 3 to 4 mm in size. These polyps were removed with       a cold snare. Resection and retrieval were complete. Estimated blood       loss was minimal.      The colon (entire examined portion) appeared normal. Biopsies for       histology were taken with a cold forceps from the entire colon for       evaluation of microscopic colitis. Estimated blood loss was minimal.      The terminal ileum appeared normal. Approximately 10 cm was successfully       evaluated. Biopsies were taken with a cold forceps for histology.       Estimated blood loss: none.      The exam was otherwise without abnormality on direct and retroflexion       views. Impression:               - Mild diverticulosis in the sigmoid colon and in                            the descending colon.                           - Two 3 to 4 mm polyps  in the descending colon and                            in the ascending colon, removed with a cold snare.                            Resected and retrieved.                           - The entire examined colon is normal. No                            endoscopically-identified colitis. Biopsied.                           - The examined portion of the ileum was normal.                            Biopsied.                           - The examination was otherwise normal on direct                            and retroflexion views. Moderate Sedation:      Not Applicable - Patient had care per Anesthesia. Recommendation:           - Discharge patient to home.                           - Resume regular diet today.                           - Continue present medications.                           - Await pathology results.                           - Repeat colonoscopy in 5 years for surveillance if                            at least one polyp is adenomatous.                            - Return to GI office at the next available                            appointment to review these results. Procedure Code(s):        --- Professional ---                           [phone], Colonoscopy, flexible; with removal of                            tumor(s), polyp(s), or other lesion(s) by snare  technique                           [id number], 59, Colonoscopy, flexible; with biopsy,                            single or multiple Diagnosis Code(s):        --- Professional ---                           D12.4, Benign neoplasm of descending colon                           D12.2, Benign neoplasm of ascending colon                           K57.30, Diverticulosis of large intestine without                            perforation or abscess without bleeding CPT copyright 2018 American Medical Association. All rights reserved. The codes documented in this report are preliminary and upon coder review may  be revised to meet current compliance requirements. Thornton Park MD, MD 07/04/2018 12:20:48 PM This report has been signed electronically. Number of Addenda: 0

## 2018-07-05 ENCOUNTER — Encounter (HOSPITAL_COMMUNITY): Payer: Self-pay | Admitting: Gastroenterology

## 2018-07-06 ENCOUNTER — Other Ambulatory Visit: Payer: Self-pay | Admitting: Family Medicine

## 2018-07-06 NOTE — Telephone Encounter (Signed)
Electronic refill request for Zoloft Medication no longer on medication list Last office visit 04/15/18, patient changed to Celexa

## 2018-07-06 NOTE — Telephone Encounter (Signed)
Denied - pt now on celexa.

## 2018-07-15 ENCOUNTER — Encounter: Payer: Self-pay | Admitting: Gastroenterology

## 2018-07-15 ENCOUNTER — Ambulatory Visit (INDEPENDENT_AMBULATORY_CARE_PROVIDER_SITE_OTHER): Payer: 59 | Admitting: Gastroenterology

## 2018-07-15 ENCOUNTER — Other Ambulatory Visit: Payer: 59

## 2018-07-15 VITALS — BP 118/82 | HR 80 | Ht 67.0 in | Wt 336.0 lb

## 2018-07-15 DIAGNOSIS — R143 Flatulence: Secondary | ICD-10-CM

## 2018-07-15 DIAGNOSIS — R1011 Right upper quadrant pain: Secondary | ICD-10-CM

## 2018-07-15 DIAGNOSIS — K529 Noninfective gastroenteritis and colitis, unspecified: Secondary | ICD-10-CM

## 2018-07-15 DIAGNOSIS — R142 Eructation: Secondary | ICD-10-CM

## 2018-07-15 DIAGNOSIS — R141 Gas pain: Secondary | ICD-10-CM | POA: Diagnosis not present

## 2018-07-15 MED ORDER — PANTOPRAZOLE SODIUM 40 MG PO TBEC: 40.0000 mg | DELAYED_RELEASE_TABLET | Freq: Two times a day (BID) | ORAL | 1 refills | Status: DC

## 2018-07-15 MED ORDER — COLESTIPOL HCL 1 G PO TABS: 2.0000 g | ORAL_TABLET | Freq: Two times a day (BID) | ORAL | 3 refills | Status: DC

## 2018-07-15 NOTE — Progress Notes (Signed)
Referring Provider: Ria Bush, MD Primary Care Physician:  Ria Bush, MD   Chief complaint:  Abdominal pain and diarrhea   IMPRESSION:  RUQ abdominal pain and post-prandial diarrhea Gas, eructation, and flatus - ? SIBO v    - improved with 2 weeks of rifaximin 550 mg TID Fecal calprotectin of 153, elevated ESR History of colon polyps    - ascending TA and descending TA removed 07/04/2018    - surveillance colonoscopy due 2025 BMI of 52 Hepatic steatosis by ultrasound and CT Cholecystectomy 2018 for choledocholithiasis  Fecal calprotectin and ESR were mildly elevated. CRP was normal. IgA tissue transglutaminase, IgA level to test for celiac disease were normal. Colonoscopy with random biopsies was normal except for revealing two small tubular adenomas.  Differential for symptoms remains bile acid diarrhea, variant of functional GI disorder such as post prandial diarrhea syndrome, or food allergies/intolerances.  Despite elevated fecal calprotectin, no evidence for colonic or terminal ileum IBD. Would have a low threshold to consider small bowel enterography.     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 The interval history is obtained through the patient. Her husband accompanies her to this appointment  Colonoscopy 07/04/2026 revealed diverticulosis in the sigmoid and descending colon, a descending colon tubular adenoma,  an ascending tubular adenoma and was otherwise normal.  Random biopsies from the colon and terminal ileum were normal  Sedimentation rate was 60.  CRP was normal at 4.7.  T TGA and IgA were negative for celiac.  Fecal calprotectin was elevated at 153.  Trial of Protonix 40 mg BID, and rifaxamin 550 mg TID x 2 weeks recommended. Eructation and bloating improved with rifaxamin. No change in diarrhea while on rifaximin. Diarrhea worsens if she does not eat. "Crazy" reflux is now controlled.   Continues to have intermittent diarrhea. Occurs 30 minutes after eating.  At times, will have up to 5 postprandial BM daily.  Particular worse after eating out. Symptoms improved with defecation.  Continues to have a prodrome with RUQ pain followed by LUQ pain on days that are so bad.   Abdominal pain this morning within minutes of drinking. RUQ pain occurs after almost all PO intake but also occurs without eating. Postprandial defecation. No change with defecation although she slowly gets better. No other associated symptoms. No identified exacerbating or relieving features.    Past Medical History:  Diagnosis Date  . Allergy   . 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 Anxiety   . Constipation    alternating from constipation to diarrhea  . Depression   . Gallstones 06/2016   by xray - surgery to remove  . Generalized headaches    frequent  . GERD (gastroesophageal reflux disease)   . History of abnormal Pap smear    remote  . History of gestational diabetes    first 2 pregnancies  . 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[place])    CD4 level is 1100 per patient  . HSV-2  seropositive   . Hypertension   . Kidney stones   . Morbid obesity ([place])   . Periodontal disease 08/2011   currently getting dental work  . Tobacco use     Past Surgical History:  Procedure Laterality Date  . BIOPSY  07/04/2018   Procedure: BIOPSY;  Surgeon: Thornton Park, MD;  Location: [facility];  Service: Gastroenterology;;  .  CESAREAN SECTION  [phone]   x2  . CHOLECYSTECTOMY  2018  . COLONOSCOPY WITH PROPOFOL N/A 07/04/2018   Procedure: COLONOSCOPY WITH PROPOFOL;  Surgeon: Thornton Park, MD;  Location: [facility];  Service: Gastroenterology;  Laterality: N/A;  . HYSTEROSCOPY W/D&C N/A 01/05/2018   Minerva for heavy bleeding, IUD removed - DILATATION AND CURETTAGE /HYSTEROSCOPY WITH MINERVA ABLATION; [name], MD  . [place]  . POLYPECTOMY  07/04/2018   Procedure: POLYPECTOMY;  Surgeon: Thornton Park, MD;  Location: [facility];  Service: Gastroenterology;;  . TUBAL LIGATION    . WISDOM TOOTH EXTRACTION     x 4    Current Outpatient Medications  Medication Sig Dispense Refill  . acetaminophen (TYLENOL) 500 MG tablet Take 1,000 mg by mouth 2 (two) times daily as needed for moderate pain or headache.     Marland Kitchen amLODipine (NORVASC) 5 MG tablet Take 1 tablet (5 mg total) by mouth daily. (Patient taking differently: Take 5 mg by mouth at bedtime. ) 90 tablet 3  . BIKTARVY 50-200-25 MG TABS tablet Take 1 tablet by mouth at bedtime.     . citalopram (CELEXA) 20 MG tablet TAKE 1 TABLET BY MOUTH EVERY DAY (Patient taking differently: Take 20 mg by mouth at bedtime. ) 90 tablet 1  . hydrochlorothiazide (HYDRODIURIL) 25 MG tablet Take 1 tablet (25 mg total) by mouth daily. (Patient taking differently: Take 25 mg by mouth at bedtime. ) 90 tablet 3  . Multiple Vitamin (MULTIVITAMIN WITH MINERALS) TABS tablet Take 1 tablet by mouth at bedtime.    . pantoprazole (PROTONIX) 40 MG tablet Take 1 tablet (40 mg total) by mouth 2 (two) times daily. 90 tablet 1   No current facility-administered medications for this visit.     Allergies as of 07/15/2018  . (No Known Allergies)    Family History  Problem Relation Age of Onset  . Hypertension Mother   . Hypertension Father   . Appendicitis Father   . Diabetes Maternal Grandmother        s/p amputation  . Hyperlipidemia Maternal  Grandmother   . Hypertension Maternal Grandmother   . Stroke Maternal Grandmother   . Hyperlipidemia Maternal Grandfather   . Hypertension Maternal Grandfather   . Skin cancer Maternal Grandfather   . Seizures Paternal Grandmother 88       deceased  . Coronary artery disease Paternal Grandfather 55       MI  . Breast cancer Neg Hx   . Colon cancer Neg Hx   . Colon polyps Neg Hx   . Esophageal cancer Neg Hx   . Rectal cancer Neg Hx   . Stomach cancer Neg Hx     Social History   Socioeconomic History  . Marital status: Married    Spouse name: Not on file  . Number of children: 2  . Years of education: Not on file  . Highest education level: Not on file  Occupational History  . Occupation: CSR  Social Needs  . Financial resource strain: Not on file  . Food insecurity:  Worry: Not on file    Inability: Not on file  . Transportation needs:    Medical: Not on file    Non-medical: Not on file  Tobacco Use  . Smoking status: Current Some Day Smoker    Packs/day: 0.10    Years: 23.00    Pack years: 2.30    Types: Cigarettes    Last attempt to quit: 02/02/2018    Years since quitting: 0.4  . Smokeless tobacco: Never Used  . Tobacco comment: as of 04/27/18 working on quitting  Substance and Sexual Activity  . Alcohol use: Yes    Alcohol/week: 0.0 standard drinks    Comment: occasional  . Drug use: No  . Sexual activity: Yes    Partners: Male    Birth control/protection: Surgical, I.U.D., Pill    Comment: Mirena IUD  Lifestyle  . Physical activity:    Days per week: Not on file    Minutes per session: Not on file  . Stress: Not on file  Relationships  . Social connections:    Talks on phone: Not on file    Gets together: Not on file    Attends religious service: Not on file    Active member of club or organization: Not on file    Attends meetings of clubs or organizations: Not on file    Relationship status: Not on file  . Intimate partner violence:    Fear  of current or ex partner: Not on file    Emotionally abused: Not on file    Physically abused: Not on file    Forced sexual activity: Not on file  Other Topics Concern  . Not on file  Social History Narrative   Lives with husband and 2 children, no pets   Occupation: call center rep   Edu: some college   Activity: no regular exercise.  Tries to walk around building.   Diet: 1 mt dew in am, rest water, fruits/vegetables daily    Filed Weights   07/15/18 1008  Weight: (!) 336 lb (152.4 kg)    Physical Exam: Vital signs were reviewed. General:   Alert, well-nourished, pleasant and cooperative in NAD Head:  Normocephalic and atraumatic. Eyes:  Sclera clear, no icterus.   Conjunctiva pink. Mouth:  No deformity or lesions.   Neck:  Supple; no thyromegaly. Lungs:  Clear throughout to auscultation.   No wheezes.  Heart:  Regular rate and rhythm; no murmurs Abdomen:  Central obesity, soft, nontender, normal bowel sounds. No rebound or guarding. No hepatosplenomegaly Neurologic:  Alert and  oriented x4;  grossly nonfocal Psych:  Alert and cooperative. Normal mood and affect.   Tiara Maultsby L. Tarri Glenn Md, MPH [facility] 07/15/2018, 10:32 AM

## 2018-07-15 NOTE — Patient Instructions (Addendum)
Your provider has requested that you go to the basement level for lab work before leaving today. Press "B" on the elevator. The lab is located at the first door on the left as you exit the elevator.  We have sent the following medications to your pharmacy for you to pick up at your convenience: Colestipol 2 g  Continue Protonix 40 mg twice a day  You will be due for a recall colonoscopy in 2025. We will send you a reminder in the mail when it gets closer to that time.  Add a daily fiber supplement such as Citrucel to help bulk up your stools.   Use a food diary to identify triggers.   Avoid lactose.

## 2018-08-12 ENCOUNTER — Other Ambulatory Visit: Payer: Self-pay | Admitting: Gastroenterology

## 2018-08-16 ENCOUNTER — Other Ambulatory Visit
Admission: RE | Admit: 2018-08-16 | Discharge: 2018-08-16 | Disposition: A | Discharge status: Home / Self Care | Payer: 59 | Source: Ambulatory Visit | Attending: Infectious Diseases | Admitting: Infectious Diseases

## 2018-08-16 ENCOUNTER — Encounter: Payer: Self-pay | Admitting: Infectious Diseases

## 2018-08-16 ENCOUNTER — Ambulatory Visit: Payer: 59 | Attending: Infectious Diseases | Admitting: Infectious Diseases

## 2018-08-16 ENCOUNTER — Telehealth: Payer: Self-pay

## 2018-08-16 ENCOUNTER — Other Ambulatory Visit: Payer: Self-pay

## 2018-08-16 DIAGNOSIS — I10 Essential (primary) hypertension: Secondary | ICD-10-CM | POA: Diagnosis not present

## 2018-08-16 DIAGNOSIS — Z23 Encounter for immunization: Secondary | ICD-10-CM

## 2018-08-16 DIAGNOSIS — R197 Diarrhea, unspecified: Secondary | ICD-10-CM | POA: Diagnosis not present

## 2018-08-16 DIAGNOSIS — Z720 Tobacco use: Secondary | ICD-10-CM

## 2018-08-16 DIAGNOSIS — B20 Human immunodeficiency virus [HIV] disease: Secondary | ICD-10-CM

## 2018-08-16 DIAGNOSIS — Z21 Asymptomatic human immunodeficiency virus [HIV] infection status: Secondary | ICD-10-CM

## 2018-08-16 DIAGNOSIS — R51 Headache: Secondary | ICD-10-CM

## 2018-08-16 DIAGNOSIS — R635 Abnormal weight gain: Secondary | ICD-10-CM

## 2018-08-16 DIAGNOSIS — Z6841 Body Mass Index (BMI) 40.0 and over, adult: Secondary | ICD-10-CM

## 2018-08-16 DIAGNOSIS — Z79899 Other long term (current) drug therapy: Secondary | ICD-10-CM

## 2018-08-16 DIAGNOSIS — Z86010 Personal history of colonic polyps: Secondary | ICD-10-CM

## 2018-08-16 LAB — COMPREHENSIVE METABOLIC PANEL
ALT: 33 U/L (ref 0–44)
AST: 31 U/L (ref 15–41)
Albumin: 3.7 g/dL (ref 3.5–5.0)
Alkaline Phosphatase: 79 U/L (ref 38–126)
Anion gap: 11 (ref 5–15)
BUN: 12 mg/dL (ref 6–20)
CO2: 26 mmol/L (ref 22–32)
Calcium: 8.7 mg/dL — ABNORMAL LOW (ref 8.9–10.3)
Chloride: 101 mmol/L (ref 98–111)
Creatinine, Ser: 0.91 mg/dL (ref 0.44–1.00)
GFR calc Af Amer: >60 mL/min (ref >60)
GFR calc non Af Amer: >60 mL/min (ref >60)
Glucose, Bld: 142 mg/dL — ABNORMAL HIGH (ref 70–99)
Potassium: 3.8 mmol/L (ref 3.5–5.1)
Sodium: 138 mmol/L (ref 135–145)
Total Bilirubin: 1 mg/dL (ref 0.3–1.2)
Total Protein: 7.2 g/dL (ref 6.5–8.1)

## 2018-08-16 MED ORDER — CYCLOBENZAPRINE HCL 5 MG PO TABS: 5.0000 mg | ORAL_TABLET | Freq: Two times a day (BID) | ORAL | 0 refills | Status: DC | PRN

## 2018-08-16 MED ORDER — BIKTARVY 50-200-25 MG PO TABS: 1.0000 | ORAL_TABLET | Freq: Every day | ORAL | 4 refills | Status: DC

## 2018-08-16 NOTE — Telephone Encounter (Signed)
May try flexeril 5mg  1-2 tablets PRN headache, mainly take at night due to sedation precautions, don't take and drive. If no better, come in for office visit.

## 2018-08-16 NOTE — Addendum Note (Signed)
Addended by: Jarrett Ables D on: 08/16/2018 12:44 PM   Modules accepted: Orders

## 2018-08-16 NOTE — Progress Notes (Signed)
NAME: Cassandra [name]  DOB: [birth year]  MRN: [id number]  Date/Time: 08/16/2018 10:28 AM   Subjective:  Here for follow up of HIV Last seen in OCT 2019  Since then went to ED on 11/6 with severe abdominal pain and found to have a K of 2.6 and received IV and was discharged. Because of persistent diarrhea, saw GI and had work up for celiac disease, Stool PCR and was negative. Colonoscopy done on 07/03/2018 showed polyps which were excised tubular adenoma  On Biktarvy -100% adherent HIV diagnosed 2013 Nadir Cd4 >200 OI none HAARt history: first regimen Atripla 2nd regimen Biktarvy since Sept 2018  Acquired thru heterosexual contact. Husband HIV positive and on Rx   Past medical history Allergies to animal dander Diabetes mellitus in pregnancy GERD Hypertension Obesity Varicose veins Migraine Endometrial ablation for menorrhagia Gall stones Depression  Tubular adenoma colon  Past Surgical History:  Procedure Laterality Date  . BIOPSY  07/04/2018   Procedure: BIOPSY;  Surgeon: Thornton Park, MD;  Location: [facility];  Service: Gastroenterology;;  . CESAREAN SECTION  [phone]   x2  . CHOLECYSTECTOMY  2018  . COLONOSCOPY WITH PROPOFOL N/A 07/04/2018   Procedure: COLONOSCOPY WITH PROPOFOL;  Surgeon: Thornton Park, MD;  Location: [facility];  Service: Gastroenterology;  Laterality: N/A;  . HYSTEROSCOPY W/D&C N/A 01/05/2018   Minerva for heavy bleeding, IUD removed - DILATATION AND CURETTAGE /HYSTEROSCOPY WITH MINERVA ABLATION; Cassandra Jude, MD  . [name]  . POLYPECTOMY  07/04/2018   Procedure: POLYPECTOMY;  Surgeon: Thornton Park, MD;  Location: [facility];  Service: Gastroenterology;;  . TUBAL LIGATION    . WISDOM TOOTH EXTRACTION     x 4     SH Current smoker Occasional alcohol Lives with her husband Cassandra [name] female partner  and 2 sons    Family History  Problem Relation Age of Onset  . Hypertension Mother   .  Hypertension Father   . Appendicitis Father   . Diabetes Maternal Grandmother        s/p amputation  . Hyperlipidemia Maternal Grandmother   . Hypertension Maternal Grandmother   . Stroke Maternal Grandmother   . Hyperlipidemia Maternal Grandfather   . Hypertension Maternal Grandfather   . Skin cancer Maternal Grandfather   . Seizures Paternal Grandmother 72       deceased  . Coronary artery disease Paternal Grandfather 23       MI  . Breast cancer Neg Hx   . Colon cancer Neg Hx   . Colon polyps Neg Hx   . Esophageal cancer Neg Hx   . Rectal cancer Neg Hx   . Stomach cancer Neg Hx    No Known Allergies ? Current Outpatient Medications  Medication Sig Dispense Refill  . acetaminophen (TYLENOL) 500 MG tablet Take 1,000 mg by mouth 2 (two) times daily as needed for moderate pain or headache.     Marland Kitchen amLODipine (NORVASC) 5 MG tablet Take 1 tablet (5 mg total) by mouth daily. (Patient taking differently: Take 5 mg by mouth at bedtime. ) 90 tablet 3  . BIKTARVY 50-200-25 MG TABS tablet Take 1 tablet by mouth at bedtime.     . citalopram (CELEXA) 20 MG tablet TAKE 1 TABLET BY MOUTH EVERY DAY (Patient taking differently: Take 20 mg by mouth at bedtime. ) 90 tablet 1  . colestipol (COLESTID) 1 g tablet TAKE 2 TABLETS (2 G TOTAL) BY MOUTH 2 (TWO) TIMES DAILY. 180 tablet 1  .  hydrochlorothiazide (HYDRODIURIL) 25 MG tablet Take 1 tablet (25 mg total) by mouth daily. (Patient taking differently: Take 25 mg by mouth at bedtime. ) 90 tablet 3  . Multiple Vitamin (MULTIVITAMIN WITH MINERALS) TABS tablet Take 1 tablet by mouth at bedtime.    . pantoprazole (PROTONIX) 40 MG tablet Take 1 tablet (40 mg total) by mouth 2 (two) times daily. 90 tablet 1   No current facility-administered medications for this visit.     REVIEW OF SYSTEMS:  Const: negative fever, negative chills, negative weight loss Eyes: negative diplopia or visual changes, negative eye pain ENT: negative coryza, negative sore  throat Resp: negative cough, hemoptysis, dyspnea Cards: negative for chest pain, palpitations, lower extremity edema GU: negative for frequency, dysuria and hematuria Skin: negative for rash and pruritus Heme: negative for easy bruising and gum/nose bleeding MS: negative for myalgias, arthralgias, back pain and muscle weakness Neurolo: headaches, dizziness, vertigo, memory problems  Psych: negative for feelings of anxiety, depression   Objective:  VITALS:  BP (!) 126/94   Pulse 78   Temp 98.6 F (37 C)   Wt (!) 343 lb (155.6 kg)   BMI 53.72 kg/m    PHYSICAL EXAM:  General: Alert, cooperative, no distress, appears stated age.  Head: Normocephalic, without obvious abnormality, atraumatic. Eyes: Conjunctivae clear, anicteric sclerae. Pupils are equal Nose: Nares normal. No drainage or sinus tenderness. Throat: Lips, mucosa, and tongue normal. No Thrush Neck: Supple, symmetrical, no adenopathy, thyroid: non tender no carotid bruit and no JVD. Back: No CVA tenderness. Lungs: Clear to auscultation bilaterally. No Wheezing or Rhonchi. No rales. Heart: Regular rate and rhythm, no murmur, rub or gallop. Abdomen: Soft, non-tender,not distended. Bowel sounds normal. No masses Extremities: Extremities normal, atraumatic, no cyanosis. No edema. No clubbing Skin: No rashes or lesions. Not Jaundiced Lymph: Cervical, supraclavicular normal. Neurologic: Grossly non-focal Pertinent Labs As below IMAGING RESULTS: Health maintenance:  Vaccination  Vaccine Date last given comment  Influenza 03/01/18   Hepatitis B    Hepatitis A    Prevnar-PCV-13    Pneumovac-PPSV-23 01/04/15   TdaP 12/14/12   HPV    Shingrix ( zoster vaccine)     ______________________  Labs Lab Result  Date comment  HIV VL     CD4     Genotype     [id number]     HIV antibody     RPR     Quantiferon Gold     Hep C ab Neg 03/30/18   Hepatitis B-ab,ag,c Sag-Neg, Sab neg Cab neg 03/30/18   Hepatitis A-IgM, IgG /T  Neg 03/30/18   Lipid TC 170, HDL 41, LDL 108 TGL 99 11/26/17   GC/CHL     PAP            Preventive  Procedure Result  Date comment  colonoscopy Tubular adenoma 07/03/18   Mammogram Normal 11/26/17   Dental exam     Opthal       Impression/Recommendation 43 y.o. female with a history of HIV, hypertension is here to establish care She is on Biktarvy.  She has been doing well.  Viral load is undetectable and CD4 is more than thousand.   ? ?HIV under good control.  On Biktarvy.  100% adherent.  Viral load less than 20 and CD4 more than thousand. Continue Biktarvy- will do labs today  30 pound weight gain since 2018 since starting Biktarvy.( both integrase inhibitor and TAF known to caus wieght gain Will explore whether she is a candidate  for 2 pill regimen ( dovato)   Hypertension on amlodipine, hydrochlorthiazide   Headaches - To discuss with her PCP  Diarrhea- improved on meds -followed by GI  Health maintenance updated HEPA, HEPB vaccine given today Will need TdaP next visit  Labs today- RPR, quantiferon gold, Cd4, Genosure archive, VL ___________________________________________________ discussed the management with the patient Follow-up in 6 months.? ?

## 2018-08-16 NOTE — Telephone Encounter (Signed)
Pt has had dull H/A in top of head since 08/15/18 at 6 PM. If in a lot of noise or light the H/A pain becomes sharp. Pt has been taking tylenol and no help with pain. Pt usually takes ibuprofen but cannot take now due to fatty liver. Pt wants  To know if Dr Darnell Level could suggest something for the H/A since pt cannot take ibuprofen. [facility]. Pt last seen HFU 04/15/18.Please advise.

## 2018-08-16 NOTE — Patient Instructions (Addendum)
You are here for follow up - today we will do some labs- I note there is a 30 pound weight gain since changing to [place] from [place] in 2018. This is reported in the literature- will look to see whether something can be done about this. Today you will get HEP A and HEPb vaccine Your next dose of HEpB vaccine will be on 09/16/18 Third dose of Hep B is on 02/16/19 Your 2nd and final dose of HEpA is on 02/16/19  You can get your tdap vaccine on 09/16/18

## 2018-08-17 LAB — HIV-1 RNA QUANT-NO REFLEX-BLD
HIV 1 RNA Quant: <20 copies/mL
LOG10 HIV-1 RNA: UNDETERMINED log10copy/mL

## 2018-08-17 LAB — T-HELPER CELLS CD4/CD8 %
% CD 4 Pos. Lymph.: 64.2 % — ABNORMAL HIGH (ref 30.8–58.5)
Absolute CD 4 Helper: 1284 /uL (ref 359–1519)
Basophils Absolute: 0 10*3/uL (ref 0.0–0.2)
Basos: 0 %
CD3+CD4+ Cells/CD3+CD8+ Cells Bld: 2.4 (ref 0.92–3.72)
CD3+CD8+ Cells # Bld: 534 /uL (ref 109–897)
CD3+CD8+ Cells NFr Bld: 26.7 % (ref 12.0–35.5)
EOS (ABSOLUTE): 0.2 10*3/uL (ref 0.0–0.4)
Eos: 2 %
Hematocrit: 38.8 % (ref 34.0–46.6)
Hemoglobin: 13.1 g/dL (ref 11.1–15.9)
Immature Grans (Abs): 0 10*3/uL (ref 0.0–0.1)
Immature Granulocytes: 0 %
Lymphocytes Absolute: 2 10*3/uL (ref 0.7–3.1)
Lymphs: 25 %
MCH: 31.9 pg (ref 26.6–33.0)
MCHC: 33.8 g/dL (ref 31.5–35.7)
MCV: 94 fL (ref 79–97)
Monocytes Absolute: 0.4 10*3/uL (ref 0.1–0.9)
Monocytes: 5 %
Neutrophils Absolute: 5.3 10*3/uL (ref 1.4–7.0)
Neutrophils: 68 %
Platelets: 371 10*3/uL (ref 150–450)
RBC: 4.11 x10E6/uL (ref 3.77–5.28)
RDW: 12.4 % (ref 11.7–15.4)
WBC: 7.9 10*3/uL (ref 3.4–10.8)

## 2018-08-17 LAB — RPR: RPR Ser Ql: NONREACTIVE

## 2018-08-17 NOTE — Telephone Encounter (Signed)
Left message on vm per dpr relaying Dr. Synthia Innocent instructions and message.

## 2018-08-18 ENCOUNTER — Ambulatory Visit: Payer: Self-pay | Admitting: Infectious Diseases

## 2018-08-18 LAB — QUANTIFERON-TB GOLD PLUS (RQFGPL)
QuantiFERON Mitogen Value: >10 IU/mL
QuantiFERON Nil Value: 0.01 IU/mL
QuantiFERON TB1 Ag Value: 0.02 IU/mL
QuantiFERON TB2 Ag Value: 0.02 IU/mL

## 2018-08-18 LAB — QUANTIFERON-TB GOLD PLUS: QuantiFERON-TB Gold Plus: NEGATIVE

## 2018-08-23 LAB — HLA B*5701: HLA B 5701: NEGATIVE

## 2018-09-21 ENCOUNTER — Other Ambulatory Visit: Payer: 59

## 2018-10-25 ENCOUNTER — Ambulatory Visit: Payer: 59 | Attending: Infectious Diseases

## 2018-10-25 ENCOUNTER — Other Ambulatory Visit: Payer: Self-pay

## 2018-10-25 DIAGNOSIS — Z23 Encounter for immunization: Secondary | ICD-10-CM | POA: Diagnosis not present

## 2018-10-25 DIAGNOSIS — Z0000 Encounter for general adult medical examination without abnormal findings: Secondary | ICD-10-CM | POA: Diagnosis not present

## 2018-10-30 ENCOUNTER — Other Ambulatory Visit: Payer: Self-pay | Admitting: Family Medicine

## 2018-11-01 NOTE — Telephone Encounter (Signed)
Last office visit on 04/15/2018. No future appointments. Does patient need a follow up?

## 2018-11-02 ENCOUNTER — Other Ambulatory Visit: Payer: Self-pay | Admitting: Family Medicine

## 2018-11-02 ENCOUNTER — Encounter: Payer: Self-pay | Admitting: Family Medicine

## 2018-11-02 DIAGNOSIS — N946 Dysmenorrhea, unspecified: Secondary | ICD-10-CM

## 2018-11-02 MED ORDER — MEGESTROL ACETATE 40 MG PO TABS: 40.0000 mg | ORAL_TABLET | Freq: Two times a day (BID) | ORAL | 3 refills | Status: DC

## 2018-11-02 MED ORDER — CITALOPRAM HYDROBROMIDE 20 MG PO TABS: 20.0000 mg | ORAL_TABLET | Freq: Every day | ORAL | 1 refills | Status: DC

## 2018-11-02 NOTE — Telephone Encounter (Signed)
Please see refill request from 11/01/2018. Thank you

## 2018-11-03 ENCOUNTER — Other Ambulatory Visit: Payer: Self-pay | Admitting: Gastroenterology

## 2018-12-02 ENCOUNTER — Other Ambulatory Visit: Payer: Self-pay | Admitting: Gastroenterology

## 2018-12-20 ENCOUNTER — Other Ambulatory Visit: Payer: Self-pay | Admitting: Family Medicine

## 2018-12-23 ENCOUNTER — Other Ambulatory Visit: Payer: Self-pay | Admitting: Family Medicine

## 2018-12-23 ENCOUNTER — Other Ambulatory Visit (INDEPENDENT_AMBULATORY_CARE_PROVIDER_SITE_OTHER): Payer: 59

## 2018-12-23 ENCOUNTER — Other Ambulatory Visit: Payer: Self-pay

## 2018-12-23 DIAGNOSIS — I10 Essential (primary) hypertension: Secondary | ICD-10-CM

## 2018-12-23 DIAGNOSIS — Z6841 Body Mass Index (BMI) 40.0 and over, adult: Secondary | ICD-10-CM

## 2018-12-23 LAB — LIPID PANEL
Cholesterol: 173 mg/dL (ref 0–200)
HDL: 38.7 mg/dL — ABNORMAL LOW (ref >39.00)
LDL Cholesterol: 110 mg/dL — ABNORMAL HIGH (ref 0–99)
NonHDL: 133.86
Total CHOL/HDL Ratio: 4
Triglycerides: 120 mg/dL (ref 0.0–149.0)
VLDL: 24 mg/dL (ref 0.0–40.0)

## 2018-12-23 LAB — COMPREHENSIVE METABOLIC PANEL
ALT: 44 U/L — ABNORMAL HIGH (ref 0–35)
AST: 30 U/L (ref 0–37)
Albumin: 4.2 g/dL (ref 3.5–5.2)
Alkaline Phosphatase: 64 U/L (ref 39–117)
BUN: 14 mg/dL (ref 6–23)
CO2: 31 mEq/L (ref 19–32)
Calcium: 9.1 mg/dL (ref 8.4–10.5)
Chloride: 102 mEq/L (ref 96–112)
Creatinine, Ser: 1.09 mg/dL (ref 0.40–1.20)
GFR: 55.2 mL/min — ABNORMAL LOW (ref >60.00)
Glucose, Bld: 143 mg/dL — ABNORMAL HIGH (ref 70–99)
Potassium: 3.9 mEq/L (ref 3.5–5.1)
Sodium: 140 mEq/L (ref 135–145)
Total Bilirubin: 0.6 mg/dL (ref 0.2–1.2)
Total Protein: 7.1 g/dL (ref 6.0–8.3)

## 2018-12-23 LAB — MICROALBUMIN / CREATININE URINE RATIO
Creatinine,U: 230.7 mg/dL
Microalb Creat Ratio: 0.8 mg/g (ref 0.0–30.0)
Microalb, Ur: 1.9 mg/dL (ref 0.0–1.9)

## 2018-12-23 LAB — TSH: TSH: 2.35 u[IU]/mL (ref 0.35–4.50)

## 2018-12-27 ENCOUNTER — Other Ambulatory Visit: Payer: Self-pay | Admitting: Family Medicine

## 2018-12-27 ENCOUNTER — Encounter: Payer: Self-pay | Admitting: Family Medicine

## 2018-12-27 ENCOUNTER — Telehealth: Payer: Self-pay | Admitting: Family Medicine

## 2018-12-27 ENCOUNTER — Telehealth (INDEPENDENT_AMBULATORY_CARE_PROVIDER_SITE_OTHER): Payer: 59 | Admitting: Family Medicine

## 2018-12-27 DIAGNOSIS — Z6841 Body Mass Index (BMI) 40.0 and over, adult: Secondary | ICD-10-CM

## 2018-12-27 DIAGNOSIS — R739 Hyperglycemia, unspecified: Secondary | ICD-10-CM | POA: Diagnosis not present

## 2018-12-27 DIAGNOSIS — R202 Paresthesia of skin: Secondary | ICD-10-CM | POA: Diagnosis not present

## 2018-12-27 DIAGNOSIS — Z0000 Encounter for general adult medical examination without abnormal findings: Secondary | ICD-10-CM | POA: Diagnosis not present

## 2018-12-27 DIAGNOSIS — F331 Major depressive disorder, recurrent, moderate: Secondary | ICD-10-CM

## 2018-12-27 DIAGNOSIS — K219 Gastro-esophageal reflux disease without esophagitis: Secondary | ICD-10-CM | POA: Insufficient documentation

## 2018-12-27 DIAGNOSIS — I10 Essential (primary) hypertension: Secondary | ICD-10-CM

## 2018-12-27 DIAGNOSIS — K529 Noninfective gastroenteritis and colitis, unspecified: Secondary | ICD-10-CM

## 2018-12-27 DIAGNOSIS — Z1231 Encounter for screening mammogram for malignant neoplasm of breast: Secondary | ICD-10-CM

## 2018-12-27 DIAGNOSIS — N920 Excessive and frequent menstruation with regular cycle: Secondary | ICD-10-CM

## 2018-12-27 DIAGNOSIS — Z21 Asymptomatic human immunodeficiency virus [HIV] infection status: Secondary | ICD-10-CM

## 2018-12-27 MED ORDER — CYCLOBENZAPRINE HCL 5 MG PO TABS: 5.0000 mg | ORAL_TABLET | Freq: Two times a day (BID) | ORAL | 3 refills | Status: DC | PRN

## 2018-12-27 MED ORDER — AMLODIPINE BESYLATE 5 MG PO TABS: 5.0000 mg | ORAL_TABLET | Freq: Every day | ORAL | 3 refills | Status: DC

## 2018-12-27 NOTE — Assessment & Plan Note (Signed)
Preventative protocols reviewed and updated unless pt declined. Discussed healthy diet and lifestyle.  

## 2018-12-27 NOTE — Telephone Encounter (Signed)
Pt seen today via [facility] Per Dr Darnell Level needs to schedule a Nurse Visit for vitals check, POC A1C and B12 inj.   LM for patient to call back to schedule.

## 2018-12-27 NOTE — Assessment & Plan Note (Signed)
Appreciate ID care. Now on biktarvy, concerned this is contributing to weight gain.

## 2018-12-27 NOTE — Assessment & Plan Note (Signed)
Elevated sugars reviewed with patient- will check A1c when she returns for nurse visit.

## 2018-12-27 NOTE — Assessment & Plan Note (Signed)
Ongoing bleed, continues megace. Has f/u planned with GYN.

## 2018-12-27 NOTE — Assessment & Plan Note (Signed)
Improved on colestipol. S/p colonoscopy last year.

## 2018-12-27 NOTE — Assessment & Plan Note (Signed)
Continues ppi bid.

## 2018-12-27 NOTE — Assessment & Plan Note (Signed)
Check vit b12 when returns

## 2018-12-27 NOTE — Assessment & Plan Note (Signed)
Will return for vital check. Continue amlodipine and hctz for now.

## 2018-12-27 NOTE — Assessment & Plan Note (Signed)
Unable to get weight today - she will come in for nurse visit for vital check in next few days.

## 2018-12-27 NOTE — Assessment & Plan Note (Signed)
Stable period on celexa - continue.

## 2018-12-27 NOTE — Progress Notes (Signed)
Virtual visit completed through [place]. Due to national recommendations of social distancing due to COVID-19, a virtual visit is felt to be most appropriate for this patient at this time. Reviewed limitations of a virtual visit.   Patient location: home Provider location: [place] at [facility], office If any vitals were documented, they were collected by patient at home unless specified below.      CC: CPE Subjective:    Patient ID: Cassandra [name], female    DOB: 04-02-1976, 43 y.o.   MRN: [id number]  HPI: Cassandra [name] is a 43 y.o. female presenting on 12/27/2018 for Annual Exam (Pt c/o tingling sensation in sole of right foot, some muscle spasms in knee - not sure if related - x 2-3 weeks. )   HIV - sees Dr Delaine Lame ID yearly, on biktarvy. Wonderful control. Weight gain noted however - concern Biktarvy may be contributing to weight gain (30 lb weight gain since starting this).   Over last 2-3 wks noticing some paresthesias of R foot at posterior heel.   Preventative: COLONOSCOPY WITH PROPOFOL 07/04/2018 for chronic diarrhea after cholecystectomy started on colestipol with benefit ([place]). Thought she has IBS-D. 2 TA, rpt 5 yrs Well woman with Dr. Kennon Rounds - 11/2017. Normal pap smears. S/p uterine ablation for prolonged bleed - ongoing bleeding, continues megace.  Mammogram 11/2017 Birads1 - upcoming this year Flu yearly  Tdap 12/2012 Pneumovax 2016 Seat belt use discussed  Sunscreen use discussed, no changing moles on skin.  Ex smoker - 2017 Alcohol - rare Dentist - overdue Eye exam - yearly  Lives with husband and 2 children, no pets Occupation: call center rep Edu: some college Activity: Tries to walk around building.  Diet: 1 mt dew in am, rest water, fruits/vegetables daily, bringing food from home      Relevant past medical, surgical, family and social history reviewed and updated as indicated. Interim medical history since our last visit reviewed.  Allergies and medications reviewed and updated. Outpatient Medications Prior to Visit  Medication Sig Dispense Refill  . acetaminophen (TYLENOL) 500 MG tablet Take 1,000 mg by mouth 2 (two) times daily as needed for moderate pain or headache.     Marland Kitchen BIKTARVY 50-200-25 MG TABS tablet Take 1 tablet by mouth at bedtime. 30 tablet 4  . citalopram (CELEXA) 20 MG tablet Take 1 tablet (20 mg total) by mouth daily. 90 tablet 1  . colestipol (COLESTID) 1 g tablet TAKE 2 TABLETS (2 G TOTAL) BY MOUTH 2 (TWO) TIMES DAILY. 360 tablet 0  . hydrochlorothiazide (HYDRODIURIL) 25 MG tablet TAKE 1 TABLET DAILY 90 tablet 3  . megestrol (MEGACE) 40 MG tablet Take 1 tablet (40 mg total) by mouth 2 (two) times daily. 90 tablet 3  . Multiple Vitamin (MULTIVITAMIN WITH MINERALS) TABS tablet Take 1 tablet by mouth at bedtime.    . pantoprazole (PROTONIX) 40 MG tablet TAKE 1 TABLET BY MOUTH TWICE A DAY 180 tablet 0  . amLODipine (NORVASC) 5 MG tablet Take 1 tablet (5 mg total) by mouth daily. (Patient taking differently: Take 5 mg by mouth at bedtime. ) 90 tablet 3  . cyclobenzaprine (FLEXERIL) 5 MG tablet Take 1-2 tablets (5-10 mg total) by mouth 2 (two) times daily as needed (headache (w/ sedation precautions)). 30 tablet 0   No facility-administered medications prior to visit.      Per HPI unless specifically indicated in ROS section below Review of Systems  Constitutional: Negative for activity change, appetite change, chills, fatigue,  fever and unexpected weight change.  HENT: Negative for hearing loss.   Eyes: Negative for visual disturbance.  Respiratory: Negative for cough, chest tightness, shortness of breath and wheezing.   Cardiovascular: Positive for leg swelling (R ankle). Negative for chest pain and palpitations.  Gastrointestinal: Positive for constipation (due to colestipol). Negative for abdominal distention, abdominal pain, blood in stool, diarrhea, nausea and vomiting.  Genitourinary: Negative for  difficulty urinating and hematuria.  Musculoskeletal: Negative for arthralgias, myalgias and neck pain.  Skin: Negative for rash.  Neurological: Positive for headaches (requests flexeril refill). Negative for dizziness, seizures and syncope.  Hematological: Negative for adenopathy. Does not bruise/bleed easily.  Psychiatric/Behavioral: Negative for dysphoric mood. The patient is not nervous/anxious.    Objective:      Wt Readings from Last 3 Encounters:  08/16/18 (!) 343 lb (155.6 kg)  07/15/18 (!) 336 lb (152.4 kg)  07/04/18 (!) 337 lb (152.9 kg)     Physical exam: Gen: alert, NAD, not ill appearing Pulm: speaks in complete sentences without increased work of breathing Psych: normal mood, normal thought content      Results for orders placed or performed in visit on 12/23/18  TSH  Result Value Ref Range   TSH 2.35 0.35 - 4.50 uIU/mL  Comprehensive metabolic panel  Result Value Ref Range   Sodium 140 135 - 145 mEq/L   Potassium 3.9 3.5 - 5.1 mEq/L   Chloride 102 96 - 112 mEq/L   CO2 31 19 - 32 mEq/L   Glucose, Bld 143 (H) 70 - 99 mg/dL   BUN 14 6 - 23 mg/dL   Creatinine, Ser 1.09 0.40 - 1.20 mg/dL   Total Bilirubin 0.6 0.2 - 1.2 mg/dL   Alkaline Phosphatase 64 39 - 117 U/L   AST 30 0 - 37 U/L   ALT 44 (H) 0 - 35 U/L   Total Protein 7.1 6.0 - 8.3 g/dL   Albumin 4.2 3.5 - 5.2 g/dL   Calcium 9.1 8.4 - 10.5 mg/dL   GFR 55.20 (L) >60.00 mL/min  Microalbumin / creatinine urine ratio  Result Value Ref Range   Microalb, Ur 1.9 0.0 - 1.9 mg/dL   Creatinine,U 230.7 mg/dL   Microalb Creat Ratio 0.8 0.0 - 30.0 mg/g  Lipid panel  Result Value Ref Range   Cholesterol 173 0 - 200 mg/dL   Triglycerides 120.0 0.0 - 149.0 mg/dL   HDL 38.70 (L) >39.00 mg/dL   VLDL 24.0 0.0 - 40.0 mg/dL   LDL Cholesterol 110 (H) 0 - 99 mg/dL   Total CHOL/HDL Ratio 4    NonHDL 133.86    Lab Results  Component Value Date   HGBA1C 6.2 11/26/2017   Depression screen [facility] 2/9 12/27/2018 12/03/2017  12/03/2017 12/03/2017  Decreased Interest 0 1 1 0  Down, Depressed, Hopeless 0 1 1 -  PHQ - 2 Score 0 2 2 0  Altered sleeping - 3 3 -  Tired, decreased energy - 3 3 -  Change in appetite - 3 3 -  Feeling bad or failure about yourself  - 2 2 -  Trouble concentrating - 2 2 -  Moving slowly or fidgety/restless - 1 1 -  Suicidal thoughts - 0 0 -  PHQ-9 Score - 16 16 -    Assessment & Plan:   Problem List Items Addressed This Visit    Paresthesia of right foot    Check vit b12 when returns       Relevant Orders  Vitamin B12   Morbid obesity with BMI of 50.0-59.9, adult ([place])    Unable to get weight today - she will come in for nurse visit for vital check in next few days.       MDD (major depressive disorder), recurrent episode, moderate (HCC)    Stable period on celexa - continue.       Hypertension    Will return for vital check. Continue amlodipine and hctz for now.       Relevant Medications   amLODipine (NORVASC) 5 MG tablet   Hyperglycemia    Elevated sugars reviewed with patient- will check A1c when she returns for nurse visit.       Relevant Orders   Hemoglobin A1c   HIV (human immunodeficiency virus infection) ([place])    Appreciate ID care. Now on biktarvy, concerned this is contributing to weight gain.       Heavy menstrual bleeding    Ongoing bleed, continues megace. Has f/u planned with GYN.       Health maintenance examination - Primary    Preventative protocols reviewed and updated unless pt declined. Discussed healthy diet and lifestyle.       GERD (gastroesophageal reflux disease)    Continues ppi bid.       Chronic diarrhea    Improved on colestipol. S/p colonoscopy last year.           Meds ordered this encounter  Medications  . cyclobenzaprine (FLEXERIL) 5 MG tablet    Sig: Take 1-2 tablets (5-10 mg total) by mouth 2 (two) times daily as needed (headache (w/ sedation precautions)).    Dispense:  30 tablet    Refill:  3  . amLODipine  (NORVASC) 5 MG tablet    Sig: Take 1 tablet (5 mg total) by mouth daily.    Dispense:  90 tablet    Refill:  3   Orders Placed This Encounter  Procedures  . Vitamin B12    Standing Status:   Future    Standing Expiration Date:   12/27/2019  . Hemoglobin A1c    Standing Status:   Future    Standing Expiration Date:   12/27/2019    I discussed the assessment and treatment plan with the patient. The patient was provided an opportunity to ask questions and all were answered. The patient agreed with the plan and demonstrated an understanding of the instructions. The patient was advised to call back or seek an in-person evaluation if the symptoms worsen or if the condition fails to improve as anticipated.  Follow up plan: Return in about 6 months (around 06/29/2019) for follow up visit.  Ria Bush, MD

## 2018-12-28 ENCOUNTER — Ambulatory Visit: Payer: 59

## 2018-12-28 ENCOUNTER — Other Ambulatory Visit (INDEPENDENT_AMBULATORY_CARE_PROVIDER_SITE_OTHER): Payer: 59

## 2018-12-28 VITALS — BP 114/76 | HR 103 | Temp 98.0°F | Resp 18 | Ht 69.0 in | Wt 349.2 lb

## 2018-12-28 DIAGNOSIS — R202 Paresthesia of skin: Secondary | ICD-10-CM | POA: Diagnosis not present

## 2018-12-28 DIAGNOSIS — R739 Hyperglycemia, unspecified: Secondary | ICD-10-CM

## 2018-12-28 DIAGNOSIS — E538 Deficiency of other specified B group vitamins: Secondary | ICD-10-CM

## 2018-12-28 DIAGNOSIS — I10 Essential (primary) hypertension: Secondary | ICD-10-CM

## 2018-12-28 LAB — HEMOGLOBIN A1C: Hgb A1c MFr Bld: 6.9 % — ABNORMAL HIGH (ref 4.6–6.5)

## 2018-12-28 LAB — VITAMIN B12: Vitamin B-12: 115 pg/mL — ABNORMAL LOW (ref 211–911)

## 2018-12-28 NOTE — Progress Notes (Signed)
Noted! Thank you

## 2018-12-28 NOTE — Progress Notes (Signed)
Per Dr. Danise Mina encounter order on 12/27/2018, patient presents today for a nurse visit blood pressure check for ongoing follow up and management.  Vital Sign Readings today put in the vitals signs section.   B/p 114/76. Pulse 103, Tempt 98, Weight 349lb 4oz

## 2018-12-29 ENCOUNTER — Other Ambulatory Visit: Payer: Self-pay | Admitting: Licensed Clinical Social Worker

## 2018-12-29 MED ORDER — BIKTARVY 50-200-25 MG PO TABS: 1.0000 | ORAL_TABLET | Freq: Every day | ORAL | 4 refills | Status: DC

## 2018-12-29 NOTE — Telephone Encounter (Signed)
This was done.

## 2018-12-31 DIAGNOSIS — E538 Deficiency of other specified B group vitamins: Secondary | ICD-10-CM | POA: Insufficient documentation

## 2018-12-31 MED ORDER — B-12 1000 MCG SL SUBL: 1.0000 | SUBLINGUAL_TABLET | Freq: Every day | SUBLINGUAL | Status: DC

## 2018-12-31 MED ORDER — METFORMIN HCL 500 MG PO TABS: 500.0000 mg | ORAL_TABLET | Freq: Every day | ORAL | 3 refills | Status: DC

## 2018-12-31 NOTE — Progress Notes (Addendum)
See recent labs - new onset diabetes, vit B12 def. Start b12 and metformin. I asked her to return in 27months f/u DM visit.

## 2018-12-31 NOTE — Addendum Note (Signed)
Addended by: Ria Bush on: 12/31/2018 10:44 AM   Modules accepted: Orders

## 2019-01-05 ENCOUNTER — Encounter: Payer: Self-pay | Admitting: Family Medicine

## 2019-01-05 ENCOUNTER — Other Ambulatory Visit: Payer: Self-pay

## 2019-01-05 ENCOUNTER — Ambulatory Visit
Admission: RE | Admit: 2019-01-05 | Discharge: 2019-01-05 | Disposition: A | Discharge status: Home / Self Care | Payer: 59 | Source: Ambulatory Visit | Attending: Family Medicine | Admitting: Family Medicine

## 2019-01-05 DIAGNOSIS — Z1231 Encounter for screening mammogram for malignant neoplasm of breast: Secondary | ICD-10-CM | POA: Insufficient documentation

## 2019-01-05 LAB — HM MAMMOGRAPHY

## 2019-01-06 ENCOUNTER — Encounter: Payer: Self-pay | Admitting: Family Medicine

## 2019-01-12 ENCOUNTER — Other Ambulatory Visit: Payer: Self-pay | Admitting: Licensed Clinical Social Worker

## 2019-01-12 DIAGNOSIS — B20 Human immunodeficiency virus [HIV] disease: Secondary | ICD-10-CM

## 2019-01-12 MED ORDER — BIKTARVY 50-200-25 MG PO TABS: 1.0000 | ORAL_TABLET | Freq: Every day | ORAL | 4 refills | Status: DC

## 2019-01-17 ENCOUNTER — Telehealth: Payer: Self-pay

## 2019-01-17 MED ORDER — ACCU-CHEK FASTCLIX LANCETS MISC: 1.0000 | 0 refills | Status: DC

## 2019-01-17 MED ORDER — ACCU-CHEK GUIDE VI STRP: 1.0000 | ORAL_STRIP | 0 refills | Status: AC | PRN

## 2019-01-17 NOTE — Telephone Encounter (Signed)
E-scribed test strips and laNcets to [facility].  Notified pt via [place].

## 2019-01-17 NOTE — Addendum Note (Signed)
Addended by: Brenton Grills on: 9/57/4734 03:70 AM   Modules accepted: Orders

## 2019-01-17 NOTE — Telephone Encounter (Signed)
E-scribed test strips and lancets to [facility]

## 2019-01-26 ENCOUNTER — Other Ambulatory Visit: Payer: Self-pay | Admitting: Gastroenterology

## 2019-02-10 ENCOUNTER — Other Ambulatory Visit: Payer: Self-pay | Admitting: Family Medicine

## 2019-02-16 ENCOUNTER — Other Ambulatory Visit: Payer: Self-pay

## 2019-02-16 ENCOUNTER — Other Ambulatory Visit
Admission: RE | Admit: 2019-02-16 | Discharge: 2019-02-16 | Disposition: A | Discharge status: Home / Self Care | Payer: 59 | Source: Ambulatory Visit | Attending: Infectious Diseases | Admitting: Infectious Diseases

## 2019-02-16 ENCOUNTER — Ambulatory Visit: Payer: 59 | Attending: Infectious Diseases | Admitting: Infectious Diseases

## 2019-02-16 VITALS — BP 130/82 | HR 91 | Temp 98.2°F | Ht 69.0 in | Wt 352.0 lb

## 2019-02-16 DIAGNOSIS — E119 Type 2 diabetes mellitus without complications: Secondary | ICD-10-CM | POA: Diagnosis not present

## 2019-02-16 DIAGNOSIS — R635 Abnormal weight gain: Secondary | ICD-10-CM

## 2019-02-16 DIAGNOSIS — B20 Human immunodeficiency virus [HIV] disease: Secondary | ICD-10-CM

## 2019-02-16 DIAGNOSIS — Z23 Encounter for immunization: Secondary | ICD-10-CM | POA: Diagnosis not present

## 2019-02-16 DIAGNOSIS — E538 Deficiency of other specified B group vitamins: Secondary | ICD-10-CM

## 2019-02-16 DIAGNOSIS — I10 Essential (primary) hypertension: Secondary | ICD-10-CM

## 2019-02-16 DIAGNOSIS — F17201 Nicotine dependence, unspecified, in remission: Secondary | ICD-10-CM

## 2019-02-16 DIAGNOSIS — Z79899 Other long term (current) drug therapy: Secondary | ICD-10-CM

## 2019-02-16 DIAGNOSIS — Z6841 Body Mass Index (BMI) 40.0 and over, adult: Secondary | ICD-10-CM

## 2019-02-16 DIAGNOSIS — Z7984 Long term (current) use of oral hypoglycemic drugs: Secondary | ICD-10-CM

## 2019-02-16 NOTE — Progress Notes (Signed)
NAME: Cassandra [name]  DOB: 01-22-1976  MRN: [id number]  Date/Time: 02/16/2019 9:58 AM   Subjective:  Here for follow up of HIV Last seen 08/16/18 History of HIV on Biktarvy.  100% adherent to medication.  Last CD4 from March 2020 is 1000 258.  Viral load is undetectable.  She says she has been gaining weight.  Since her last visit and now she has gained 9 pounds and before that was 20 pounds.  There was a concern that tenofovir alafenamide and Biktarvy could be making her gain weight. Since she last saw me in March she has been to her primary care physician and started on metformin for a mildly elevated hemoglobin A1c.  This is causing her to have some diarrhea.  Also was diagnosed with low B12 and started on B12 treatment.   Medical history HIV diagnosed 2013 [place] Cd4 >200 OI none HAARt history: first regimen Atripla 2nd regimen Biktarvy since Sept 2018  Acquired thru heterosexual contact. Husband HIV positive and on Rx   Past medical history Allergies to animal dander Gestational diabetes mellitus Diabetes mellitus GERD Hypertension Obesity Varicose veins Migraine Endometrial ablation for menorrhagia Gall stones Depression  Tubular adenoma colon.  Colonoscopy done in January 2020.   Past Surgical History:  Procedure Laterality Date  . BIOPSY  07/04/2018   Procedure: BIOPSY;  Surgeon: Thornton Park, MD;  Location: [facility];  Service: Gastroenterology;;  . CESAREAN SECTION  [phone]   x2  . CHOLECYSTECTOMY  2018  . COLONOSCOPY WITH PROPOFOL N/A 07/04/2018   for chronic diarrhea after cholecystectomy started on colestipol with benefit ([place]). Thought she has IBS-D. 2 TA, rpt 5 yrs  . HYSTEROSCOPY W/D&C N/A 01/05/2018   [place] for heavy bleeding, IUD removed - DILATATION AND CURETTAGE /HYSTEROSCOPY WITH MINERVA ABLATION; [name], MD  . [place]  . POLYPECTOMY  07/04/2018   Procedure: POLYPECTOMY;  Surgeon: Thornton Park, MD;  Location: [facility];  Service: Gastroenterology;;  . TUBAL LIGATION    . WISDOM TOOTH EXTRACTION     x 4     SH Current smoker Occasional alcohol Lives with her husband [name] female partner  and 2 sons    Family History  Problem Relation Age of Onset  . Hypertension Mother   . Hypertension Father   . Appendicitis Father   . Atrial fibrillation Father   . Diabetes Maternal Grandmother        s/p amputation  . Hyperlipidemia Maternal Grandmother   . Hypertension Maternal Grandmother   . Stroke Maternal Grandmother   . Hyperlipidemia Maternal Grandfather   . Hypertension Maternal Grandfather   . Skin cancer Maternal Grandfather   . Seizures Paternal Grandmother 75       deceased  . Coronary artery disease Paternal Grandfather 50       MI  . Breast cancer Neg Hx   . Colon cancer Neg Hx   . Colon polyps Neg Hx   . Esophageal cancer Neg Hx   . Rectal cancer Neg Hx   . Stomach cancer Neg Hx    No Known Allergies ? Current Outpatient Medications  Medication Sig Dispense Refill  . Accu-Chek FastClix Lancets MISC 1 each by Does not apply route as directed. Use as directed to check blood sugar 102 each 0  . acetaminophen (TYLENOL) 500 MG tablet Take 1,000 mg by mouth 2 (two) times daily as needed for moderate pain or headache.     Marland Kitchen amLODipine (  NORVASC) 5 MG tablet TAKE 1 TABLET DAILY 90 tablet 3  . BIKTARVY 50-200-25 MG TABS tablet Take 1 tablet by mouth at bedtime. 30 tablet 4  . citalopram (CELEXA) 20 MG tablet Take 1 tablet (20 mg total) by mouth daily. 90 tablet 1  . colestipol (COLESTID) 1 g tablet TAKE 2 TABLETS (2 G TOTAL) BY MOUTH 2 (TWO) TIMES DAILY. 360 tablet 0  . Cyanocobalamin (B-12) 1000 MCG SUBL Place 1 tablet under the tongue daily.    . cyclobenzaprine (FLEXERIL) 5 MG tablet Take 1-2 tablets (5-10 mg total) by mouth 2 (two) times daily as needed (headache (w/ sedation precautions)). 30 tablet 3  . glucose blood (ACCU-CHEK GUIDE) test strip 1 each  by Other route as needed for other. Use as instructed to check blood sugar 100 each 0  . hydrochlorothiazide (HYDRODIURIL) 25 MG tablet TAKE 1 TABLET DAILY 90 tablet 3  . megestrol (MEGACE) 40 MG tablet Take 1 tablet (40 mg total) by mouth 2 (two) times daily. 90 tablet 3  . metFORMIN (GLUCOPHAGE) 500 MG tablet Take 1 tablet (500 mg total) by mouth daily with breakfast. 90 tablet 3  . Multiple Vitamin (MULTIVITAMIN WITH MINERALS) TABS tablet Take 1 tablet by mouth at bedtime.    . pantoprazole (PROTONIX) 40 MG tablet TAKE 1 TABLET BY MOUTH TWICE A DAY 180 tablet 0   No current facility-administered medications for this visit.     REVIEW OF SYSTEMS:  Const: negative fever, negative chills, negative weight loss Eyes: negative diplopia or visual changes, negative eye pain ENT: negative coryza, negative sore throat Resp: negative cough, hemoptysis, dyspnea Cards: negative for chest pain, palpitations, lower extremity edema GU: negative for frequency, dysuria and hematuria Skin: negative for rash and pruritus Heme: negative for easy bruising and gum/nose bleeding MS: negative for myalgias, arthralgias, back pain and muscle weakness Neurolo: headaches, dizziness, vertigo, memory problems  Psych: negative for feelings of anxiety, depression   Objective:  VITALS:  BP 130/82 (BP Location: Left Arm, Patient Position: Sitting, Cuff Size: Large)   Pulse 91   Temp 98.2 F (36.8 C) (Oral)   Ht 5\' 9"  (1.753 m)   Wt (!) 352 lb (159.7 kg)   BMI 51.98 kg/m    PHYSICAL EXAM:  General: Alert, cooperative, no distress, appears stated age.  Head: Normocephalic, without obvious abnormality, atraumatic. Eyes: Conjunctivae clear, anicteric sclerae. Pupils are equal Nose: Nares normal. No drainage or sinus tenderness. Throat: Lips, mucosa, and tongue normal. No Thrush Neck: Supple, symmetrical, no adenopathy, thyroid: non tender no carotid bruit and no JVD. Back: No CVA tenderness. Lungs: Clear to  auscultation bilaterally. No Wheezing or Rhonchi. No rales. Heart: Regular rate and rhythm, no murmur, rub or gallop. Abdomen: Soft, non-tender,not distended. Bowel sounds normal. No masses Extremities: Extremities normal, atraumatic, no cyanosis. No edema. No clubbing Skin: No rashes or lesions. Not Jaundiced Lymph: Cervical, supraclavicular normal. Neurologic: Grossly non-focal Pertinent Labs As below IMAGING RESULTS: Health maintenance:  Vaccination  Vaccine Date last given comment  Influenza 03/01/18   Hepatitis B 3/10, 5/19 and 02/16/19   Hepatitis A 08/16/18 and 02/16/19   Prevnar-PCV-13    Pneumovac-PPSV-23 01/04/15   TdaP 12/14/12   HPV    Shingrix ( zoster vaccine)     ______________________  Labs Lab Result  Date comment  HIV VL  <20    CD4 1284(64.2%) 08/16/18   Genotype     [id number] Neg 08/16/2018   HIV antibody     RPR  nonreactive 08/16/2018   Quantiferon Gold  negative 08/16/2018   Hep C ab Neg 03/30/18   Hepatitis B-ab,ag,c Sag-Neg, Sab neg Cab neg 03/30/18   Hepatitis A-IgM, IgG /T Neg 03/30/18   Lipid TC 170, HDL 41, LDL 108 TGL 99 11/26/17   GC/CHL     PAP            Preventive  Procedure Result  Date comment  colonoscopy Tubular adenoma 07/03/18   Mammogram Normal 11/26/17   Dental exam     Opthal       Impression/Recommendation 43 y.o. female with a history of HIV, hypertension               ? ?HIV under good control.  On Biktarvy.  100% adherent.  Viral load less than 20 and CD4 is 1284   39 pound weight gain since 2018 since starting Biktarvy.( both integrase inhibitor and TAF known to caus wieght gain Will explore whether she is a candidate for 2 pill regimen ( dovato) we will send [employer] testing today.  And if no mutations we can switch her to to pill regimen of dolutegravir plus 3TC. TSH normal   Hypertension on amlodipine, hydrochlorthiazide  B12 deficiency started on supplement.  She is on PPI for the past year.  This can  cause low B12.  Explained that to her.  She will discuss with her PCP.  Diabetes mellitus started on metformin  Health maintenance updated  HEPA, HEPB vaccine given today    ___________________________________________________ discussed the management with the patient Follow-up in 6 months.? ?

## 2019-02-16 NOTE — Patient Instructions (Addendum)
You are here for follow up- your main concern is weight gain- today we will check your HIV genosure archive . We may be able to change Biktarvy to Dovato- As the TAF in [place] may cause weight gain

## 2019-02-21 ENCOUNTER — Other Ambulatory Visit: Payer: Self-pay

## 2019-02-21 ENCOUNTER — Encounter: Payer: Self-pay | Admitting: Family Medicine

## 2019-02-25 ENCOUNTER — Other Ambulatory Visit: Payer: Self-pay | Admitting: Gastroenterology

## 2019-02-26 ENCOUNTER — Other Ambulatory Visit: Payer: Self-pay

## 2019-02-26 ENCOUNTER — Encounter: Payer: Self-pay | Admitting: Emergency Medicine

## 2019-02-26 ENCOUNTER — Ambulatory Visit (INDEPENDENT_AMBULATORY_CARE_PROVIDER_SITE_OTHER): Payer: 59

## 2019-02-26 ENCOUNTER — Ambulatory Visit
Admission: EM | Admit: 2019-02-26 | Discharge: 2019-02-26 | Disposition: A | Discharge status: Home / Self Care | Payer: 59 | Attending: Family Medicine | Admitting: Family Medicine

## 2019-02-26 DIAGNOSIS — M79671 Pain in right foot: Secondary | ICD-10-CM | POA: Diagnosis not present

## 2019-02-26 DIAGNOSIS — S93401A Sprain of unspecified ligament of right ankle, initial encounter: Secondary | ICD-10-CM | POA: Diagnosis not present

## 2019-02-26 DIAGNOSIS — W108XXA Fall (on) (from) other stairs and steps, initial encounter: Secondary | ICD-10-CM

## 2019-02-26 DIAGNOSIS — M25571 Pain in right ankle and joints of right foot: Secondary | ICD-10-CM | POA: Diagnosis not present

## 2019-02-26 MED ORDER — TRAMADOL HCL 50 MG PO TABS: 50.0000 mg | ORAL_TABLET | Freq: Three times a day (TID) | ORAL | 0 refills | Status: DC | PRN

## 2019-02-26 NOTE — ED Triage Notes (Signed)
Patient states that she going up some steps and fell down about 1 hour ago.  Patient c/o pain in her right ankle and foot.

## 2019-02-26 NOTE — Discharge Instructions (Addendum)
Rest. Ice. Elevation.  Medication as needed.  Take care  Dr. Lacinda Axon

## 2019-02-26 NOTE — ED Provider Notes (Signed)
[facility]    CSN: [id number] Arrival date & time: 02/26/19  1408   History   Chief Complaint Chief Complaint  Patient presents with  . Ankle Pain    right  . Foot Pain    right   HPI   43 year old female presents with the above complaint.  Patient reports that she injured herself approximately 1 hour prior to arrival.  She was going up some steps subsequently lost her footing and fell.  She suffered an inversion injury.  She reports right foot pain, ankle pain, and associated swelling.  Pain is rated a 6/10 in severity.  No medications or interventions tried.  She is currently icing the area.  No other reported injuries.  No other complaints or concerns at this time.  PMH, Surgical Hx, Family Hx, Social History reviewed and updated as below.  Past Medical History:  Diagnosis Date  . Allergy   . 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 Anxiety   . Constipation    alternating from constipation to diarrhea  . Depression   . Gallstones 06/2016   by xray - surgery to remove  . Generalized headaches    frequent  . GERD (gastroesophageal reflux disease)   . History of abnormal Pap smear    remote  . History of gestational diabetes    first 2 pregnancies  . 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[place])    CD4 level is 1100 per patient  . HSV-2 seropositive   . Hypertension   . Kidney stones   . Morbid obesity ([place])   . Periodontal disease 08/2011   currently getting dental work  . Tobacco use     Patient Active Problem List   Diagnosis Date Noted  . Vitamin B12 deficiency 12/31/2018  . Paresthesia of right foot 12/27/2018  . GERD (gastroesophageal reflux disease) 12/27/2018  . Polyp of descending colon   . Chronic diarrhea 04/04/2018  . Cervical high risk human papillomavirus (HPV) DNA test positive 11/17/2017  . Closed compression fracture of L1 lumbar vertebra 07/05/2016  . Lumbar pain with radiation down both legs 12/20/2015  . Health maintenance examination 05/25/2014   . Ulnar neuropathy 06/28/2013  . Heavy menstrual bleeding 10/19/2012  . Hypertension 09/14/2012  . HIV (human immunodeficiency virus infection) ([place]) 01/04/2012  . Varicose vein 01/01/2012  . MDD (major depressive disorder), recurrent episode, moderate ([place]) 09/02/2011  . Morbid obesity with BMI of 50.0-59.9, adult ([place])   . Ex-smoker   . Controlled diabetes mellitus type 2 with complications [facility])     Past Surgical History:  Procedure Laterality Date  . BIOPSY  07/04/2018   Procedure: BIOPSY;  Surgeon: Thornton Park, MD;  Location: [facility];  Service: Gastroenterology;;  . CESAREAN SECTION  [phone]   x2  . CHOLECYSTECTOMY  2018  . COLONOSCOPY WITH PROPOFOL N/A 07/04/2018   for chronic diarrhea after cholecystectomy started on colestipol with benefit ([place]). Thought she has IBS-D. 2 TA, rpt 5 yrs  . HYSTEROSCOPY W/D&C N/A 01/05/2018   [place] for heavy bleeding, IUD removed - DILATATION AND CURETTAGE /HYSTEROSCOPY WITH MINERVA ABLATION; [name], MD  . [place]  . POLYPECTOMY  07/04/2018   Procedure: POLYPECTOMY;  Surgeon: Thornton Park, MD;  Location: [facility];  Service: Gastroenterology;;  . TUBAL LIGATION    . WISDOM TOOTH EXTRACTION     x 4    OB History    Gravida  2   Para  2   Term  2   Preterm  0   AB  0   Living  2     SAB  0   TAB  0   Ectopic  0   Multiple  0   Live Births  2            Home Medications    Prior to Admission medications   Medication Sig Start Date End Date Taking? Authorizing Provider  amLODipine (NORVASC) 5 MG tablet TAKE 1 TABLET DAILY 02/10/19  Yes Ria Bush, MD  BIKTARVY 50-200-25 MG TABS tablet Take 1 tablet by mouth at bedtime. 01/12/19  Yes Tsosie Billing, MD  citalopram (CELEXA) 20 MG tablet Take 1 tablet (20 mg total) by mouth daily. 11/02/18  Yes Ria Bush, MD  colestipol (COLESTID) 1 g tablet TAKE 2 TABLETS (2 G TOTAL) BY MOUTH 2 (TWO)  TIMES DAILY. 02/25/19  Yes Thornton Park, MD  Cyanocobalamin (B-12) 1000 MCG SUBL Place 1 tablet under the tongue daily. 12/31/18  Yes Ria Bush, MD  hydrochlorothiazide (HYDRODIURIL) 25 MG tablet TAKE 1 TABLET DAILY 12/21/18  Yes Ria Bush, MD  megestrol (MEGACE) 40 MG tablet Take 1 tablet (40 mg total) by mouth 2 (two) times daily. 11/02/18  Yes [name], MD  metFORMIN (GLUCOPHAGE) 500 MG tablet Take 1 tablet (500 mg total) by mouth daily with breakfast. 12/31/18  Yes Ria Bush, MD  Multiple Vitamin (MULTIVITAMIN WITH MINERALS) TABS tablet Take 1 tablet by mouth at bedtime.   Yes [provider]  pantoprazole (PROTONIX) 40 MG tablet TAKE 1 TABLET BY MOUTH TWICE A DAY 01/26/19  Yes Thornton Park, MD  Accu-Chek FastClix Lancets MISC 1 each by Does not apply route as directed. Use as directed to check blood sugar 01/17/19   Ria Bush, MD  acetaminophen (TYLENOL) 500 MG tablet Take 1,000 mg by mouth 2 (two) times daily as needed for moderate pain or headache.     [provider]  glucose blood (ACCU-CHEK GUIDE) test strip 1 each by Other route as needed for other. Use as instructed to check blood sugar 01/17/19   Ria Bush, MD  traMADol (ULTRAM) 50 MG tablet Take 1 tablet (50 mg total) by mouth every 8 (eight) hours as needed. 02/26/19   Coral Spikes, DO  colestipol (COLESTID) 1 g tablet TAKE 2 TABLETS (2 G TOTAL) BY MOUTH 2 (TWO) TIMES DAILY. 12/02/18   Thornton Park, MD    Family History Family History  Problem Relation Age of Onset  . Hypertension Mother   . Hypertension Father   . Appendicitis Father   . Atrial fibrillation Father   . Diabetes Maternal Grandmother        s/p amputation  . Hyperlipidemia Maternal Grandmother   . Hypertension Maternal Grandmother   . Stroke Maternal Grandmother   . Hyperlipidemia Maternal Grandfather   . Hypertension Maternal Grandfather   . Skin cancer Maternal Grandfather   . Seizures  Paternal Grandmother 66       deceased  . Coronary artery disease Paternal Grandfather 58       MI  . Breast cancer Neg Hx   . Colon cancer Neg Hx   . Colon polyps Neg Hx   . Esophageal cancer Neg Hx   . Rectal cancer Neg Hx   . Stomach cancer Neg Hx     Social History Social History   Tobacco Use  . Smoking status: Former Smoker    Packs/day: 0.10  Years: 23.00    Pack years: 2.30    Types: Cigarettes    Quit date: 12/20/2018    Years since quitting: 0.1  . Smokeless tobacco: Never Used  . Tobacco comment: as of 04/27/18 working on quitting  Substance Use Topics  . Alcohol use: Yes    Alcohol/week: 0.0 standard drinks    Comment: occasional  . Drug use: No     Allergies   Patient has no known allergies.   Review of Systems Review of Systems  Constitutional: Negative.   Musculoskeletal:       Right foot and ankle pain.   Physical Exam Triage Vital Signs ED Triage Vitals  Enc Vitals Group     BP 02/26/19 1420 (!) 146/96     Pulse Rate 02/26/19 1420 (!) 109     Resp 02/26/19 1420 16     Temp 02/26/19 1420 98.9 F (37.2 C)     Temp Source 02/26/19 1420 Oral     SpO2 02/26/19 1420 99 %     Weight 02/26/19 1417 (!) 352 lb (159.7 kg)     Height 02/26/19 1417 5\' 9"  (1.753 m)     Head Circumference --      Peak Flow --      Pain Score 02/26/19 1416 6     Pain Loc --      Pain Edu? --      Excl. in [place]? --    Updated Vital Signs BP (!) 146/96 (BP Location: Left Arm)   Pulse (!) 109   Temp 98.9 F (37.2 C) (Oral)   Resp 16   Ht 5\' 9"  (1.753 m)   Wt (!) 159.7 kg   SpO2 99%   BMI 51.98 kg/m   Visual Acuity Right Eye Distance:   Left Eye Distance:   Bilateral Distance:    Right Eye Near:   Left Eye Near:    Bilateral Near:     Physical Exam Constitutional:      General: She is not in acute distress.    Appearance: Normal appearance. She is obese. She is not ill-appearing.  HENT:     Head: Normocephalic and atraumatic.  Eyes:     General:         Right eye: No discharge.        Left eye: No discharge.     Conjunctiva/sclera: Conjunctivae normal.  Cardiovascular:     Rate and Rhythm: Normal rate and regular rhythm.  Pulmonary:     Effort: Pulmonary effort is normal. No respiratory distress.  Musculoskeletal:     Comments: Right foot and ankle -tenderness to palpation at the lateral malleolus.  Surrounding edema.  Patient with edema of the proximal foot as well.  Tenderness over the 5th metatarsal base.  Skin:    General: Skin is warm.     Findings: No rash.  Neurological:     Mental Status: She is alert.  Psychiatric:        Mood and Affect: Mood normal.        Behavior: Behavior normal.    UC Treatments / Results  Labs (all labs ordered are listed, but only abnormal results are displayed) Labs Reviewed - No data to display  EKG   Radiology Dg Ankle Complete Right  Result Date: 02/26/2019 CLINICAL DATA:  Fall, pain EXAM: RIGHT FOOT COMPLETE - 3+ VIEW; RIGHT ANKLE - COMPLETE 3+ VIEW COMPARISON:  04/23/2017 FINDINGS: No fracture or dislocation of the right foot or right ankle.  Joint spaces are well preserved. Small plantar calcaneal spur. Soft tissue edema about the ankle. IMPRESSION: No fracture or dislocation of the right foot or right ankle. Soft tissue edema about the ankle. Electronically Signed   By: Eddie Candle M.D.   On: 02/26/2019 14:52   Dg Foot Complete Right  Result Date: 02/26/2019 CLINICAL DATA:  Fall, pain EXAM: RIGHT FOOT COMPLETE - 3+ VIEW; RIGHT ANKLE - COMPLETE 3+ VIEW COMPARISON:  04/23/2017 FINDINGS: No fracture or dislocation of the right foot or right ankle. Joint spaces are well preserved. Small plantar calcaneal spur. Soft tissue edema about the ankle. IMPRESSION: No fracture or dislocation of the right foot or right ankle. Soft tissue edema about the ankle. Electronically Signed   By: Eddie Candle M.D.   On: 02/26/2019 14:52    Procedures Procedures (including critical care time)   Medications Ordered in UC Medications - No data to display  Initial Impression / Assessment and Plan / UC Course  I have reviewed the triage vital signs and the nursing notes.  Pertinent labs & imaging results that were available during my care of the patient were reviewed by me and considered in my medical decision making (see chart for details).    43 year old female presents with ankle sprain.  X-rays negative today.  Rest, ice, elevation.  Crutches given for comfort.  Tramadol as directed for pain.  Cannot use NSAIDs secondary to drug interactions with Biktarvy as well as her citalopram.  [place] controlled substance database reviewed.  No concerns.  Final Clinical Impressions(s) / UC Diagnoses   Final diagnoses:  Sprain of right ankle, unspecified ligament, initial encounter     Discharge Instructions     Rest. Ice. Elevation.  Medication as needed.  Take care  Dr. Lacinda Axon     ED Prescriptions    Medication Sig Dispense Auth. Provider   traMADol (ULTRAM) 50 MG tablet Take 1 tablet (50 mg total) by mouth every 8 (eight) hours as needed. 10 tablet Thersa Salt G, DO     I have reviewed the PDMP during this encounter.   Coral Spikes, [place] 02/26/19 1517

## 2019-02-27 ENCOUNTER — Telehealth: Payer: Self-pay | Admitting: Family Medicine

## 2019-02-27 NOTE — Telephone Encounter (Signed)
Pt returning call.  Scheduled MyChart visit on 03/01/19 at 1:00.

## 2019-02-27 NOTE — Telephone Encounter (Signed)
Patient returned Lisa's call about scheduling a virtual visit with Dr.G.  Patient said she can make it for today.  She works 8:30-5:30 the rest of the week, but she said she could do it on her lunch hour at 1:00.  Please return patient's call.

## 2019-02-27 NOTE — Telephone Encounter (Signed)
Left message on vm per dpr asking pt to call back and schedule a virtual visit for diarrhea.

## 2019-02-27 NOTE — Telephone Encounter (Signed)
See Pt Msg, 02/21/19.

## 2019-03-01 ENCOUNTER — Encounter: Payer: Self-pay | Admitting: Family Medicine

## 2019-03-01 ENCOUNTER — Telehealth (INDEPENDENT_AMBULATORY_CARE_PROVIDER_SITE_OTHER): Payer: 59 | Admitting: Family Medicine

## 2019-03-01 VITALS — BP 130/85 | Temp 97.9°F | Ht 69.0 in | Wt 352.0 lb

## 2019-03-01 DIAGNOSIS — E118 Type 2 diabetes mellitus with unspecified complications: Secondary | ICD-10-CM | POA: Diagnosis not present

## 2019-03-01 DIAGNOSIS — E538 Deficiency of other specified B group vitamins: Secondary | ICD-10-CM

## 2019-03-01 DIAGNOSIS — K529 Noninfective gastroenteritis and colitis, unspecified: Secondary | ICD-10-CM

## 2019-03-01 NOTE — Assessment & Plan Note (Addendum)
Metformin may be worsening diarrhea - will trial off metformin and monitor effect on diarrhea. She will continue monitoring sugars and let me know if trending up to start new medication (would consider GLP1RA ?rybelsus).

## 2019-03-01 NOTE — Assessment & Plan Note (Signed)
Compliant with oral liquid B12 supplement daily.

## 2019-03-01 NOTE — Progress Notes (Signed)
Virtual visit completed through MyChart. Due to national recommendations of social distancing due to COVID-19, a virtual visit is felt to be most appropriate for this patient at this time. Reviewed limitations of a virtual visit.   Patient location: home Provider location: [place] at [facility], office If any vitals were documented, they were collected by patient at home unless specified below.    BP 130/85   Temp 97.9 F (36.6 C)   Ht 5\' 9"  (1.753 m)   Wt (!) 352 lb (159.7 kg)   BMI 51.98 kg/m    CC: diarrhea Subjective:    Patient ID: Cassandra [name], female    DOB: 02-25-1976, 43 y.o.   MRN: [id number]  HPI: Cassandra [name] is a 43 y.o. female presenting on 03/01/2019 for Diarrhea (C/o diarrhea again.  This episode started about 1 wk ago.  Thinks it may be related to metformin.  Also, c/o minor nausea.  No fever or vomiting. )   1 wk h/o diarrhea associated with mild nausea. Worse postprandial (up to 3x), worse with meats, watery and bright yellow. No pale colored stools, no greasy or floating stool. Started when she started taking metformin 500mg . No vomiting, fevers/chills. Metformin started 2 months ago. At times has to take off work due to diarrhea (about once a week). She works at call center.   Vit B12 low - she has been taking daily liquid daily.  New onset diabetes (A1c 6.9 on 12/2018). Fasting cbg's 112-115.   Has seen GI Dr Orvan Falconer for this, last seen 07/2018 note reviewed. Seen for RUQ abd pain and post-prandial diarrhea ?SIBO that improved with 2 wk rifaximin course. ESR and fecal calprotectin were mildly elevated, celiac sprue testing was normal - colonoscopy with random biopsies was largely normal. Thought bile acid diarrhea vs food allergies/intolerance vs IBS-D. rec avoid lactose, continue protonix 40mg  bid, daily citrucel, and trial cholestyramine if no improvement on cholestipol. Fecal pancreatic elastase was never completed. Planning to return to GI  (considering small bowel enterography).   She is taking colestipol 1gm 2 tab QD to BID.      Relevant past medical, surgical, family and social history reviewed and updated as indicated. Interim medical history since our last visit reviewed. Allergies and medications reviewed and updated. Outpatient Medications Prior to Visit  Medication Sig Dispense Refill  . Accu-Chek FastClix Lancets MISC 1 each by Does not apply route as directed. Use as directed to check blood sugar 102 each 0  . acetaminophen (TYLENOL) 500 MG tablet Take 1,000 mg by mouth 2 (two) times daily as needed for moderate pain or headache.     Marland Kitchen amLODipine (NORVASC) 5 MG tablet TAKE 1 TABLET DAILY 90 tablet 3  . BIKTARVY 50-200-25 MG TABS tablet Take 1 tablet by mouth at bedtime. 30 tablet 4  . citalopram (CELEXA) 20 MG tablet Take 1 tablet (20 mg total) by mouth daily. 90 tablet 1  . colestipol (COLESTID) 1 g tablet TAKE 2 TABLETS (2 G TOTAL) BY MOUTH 2 (TWO) TIMES DAILY. 360 tablet 0  . Cyanocobalamin (B-12) 1000 MCG SUBL Place 1 tablet under the tongue daily.    Marland Kitchen glucose blood (ACCU-CHEK GUIDE) test strip 1 each by Other route as needed for other. Use as instructed to check blood sugar 100 each 0  . hydrochlorothiazide (HYDRODIURIL) 25 MG tablet TAKE 1 TABLET DAILY 90 tablet 3  . megestrol (MEGACE) 40 MG tablet Take 1 tablet (40 mg total) by mouth 2 (two)  times daily. 90 tablet 3  . Multiple Vitamin (MULTIVITAMIN WITH MINERALS) TABS tablet Take 1 tablet by mouth at bedtime.    . pantoprazole (PROTONIX) 40 MG tablet TAKE 1 TABLET BY MOUTH TWICE A DAY 180 tablet 0  . traMADol (ULTRAM) 50 MG tablet Take 1 tablet (50 mg total) by mouth every 8 (eight) hours as needed. 10 tablet 0  . metFORMIN (GLUCOPHAGE) 500 MG tablet Take 1 tablet (500 mg total) by mouth daily with breakfast. 90 tablet 3   No facility-administered medications prior to visit.      Per HPI unless specifically indicated in ROS section below Review of  Systems Objective:    BP 130/85   Temp 97.9 F (36.6 C)   Ht 5\' 9"  (1.753 m)   Wt (!) 352 lb (159.7 kg)   BMI 51.98 kg/m   Wt Readings from Last 3 Encounters:  03/01/19 (!) 352 lb (159.7 kg)  02/26/19 (!) 352 lb (159.7 kg)  02/16/19 (!) 352 lb (159.7 kg)     Physical exam: Gen: alert, NAD, not ill appearing Pulm: speaks in complete sentences without increased work of breathing Psych: normal mood, normal thought content      Assessment & Plan:   Problem List Items Addressed This Visit    Vitamin B12 deficiency    Compliant with oral liquid B12 supplement daily.       Controlled diabetes mellitus type 2 with complications (H[facility])    Metformin may be worsening diarrhea - will trial off metformin and monitor effect on diarrhea. She will continue monitoring sugars and let me know if trending up to start new medication (would consider GLP1RA ?rybelsus).       Chronic diarrhea - Primary    Thought bile acid diarrhea, deteriorated since metformin restarted despite colestipol. Will stop metformin and assess response. She continues PPI BID as well as citrucel. She still has to submit fecal elastase.  Will drop off FMLA forms for me to review. Encouraged GI f/u if no improvement off metformin.           No orders of the defined types were placed in this encounter.  No orders of the defined types were placed in this encounter.   I discussed the assessment and treatment plan with the patient. The patient was provided an opportunity to ask questions and all were answered. The patient agreed with the plan and demonstrated an understanding of the instructions. The patient was advised to call back or seek an in-person evaluation if the symptoms worsen or if the condition fails to improve as anticipated.  Follow up plan: No follow-ups on file.  Eustaquio Boyden, MD

## 2019-03-01 NOTE — Assessment & Plan Note (Signed)
Thought bile acid diarrhea, deteriorated since metformin restarted despite colestipol. Will stop metformin and assess response. She continues PPI BID as well as citrucel. She still has to submit fecal elastase.  Will drop off FMLA forms for me to review. Encouraged GI f/u if no improvement off metformin.

## 2019-03-04 ENCOUNTER — Encounter: Payer: Self-pay | Admitting: Family Medicine

## 2019-03-07 ENCOUNTER — Telehealth: Payer: Self-pay | Admitting: Family Medicine

## 2019-03-07 NOTE — Telephone Encounter (Signed)
fmla paperwork in dr Synthia Innocent in box For review and signature

## 2019-03-07 NOTE — Telephone Encounter (Signed)
Filled out and in my outbox.

## 2019-03-10 ENCOUNTER — Other Ambulatory Visit: Payer: Self-pay | Admitting: Family Medicine

## 2019-03-10 DIAGNOSIS — N946 Dysmenorrhea, unspecified: Secondary | ICD-10-CM

## 2019-03-10 NOTE — Telephone Encounter (Signed)
Left message asking pt to call office  Paperwork is ready for pick up.  There is no fax number for me to fax paperwork pt will need to turn paperwork  Copy for pt Copy for scan

## 2019-03-12 ENCOUNTER — Telehealth: Payer: Self-pay | Admitting: Emergency Medicine

## 2019-03-12 NOTE — Telephone Encounter (Signed)
Patient contacted office requesting return back to work note due to fall at work.  I informed patient that she needs to contact HR from her job and have them to direct her were she has to go to be evaluated to rtw. Patient acknowledge undrstanding

## 2019-03-14 NOTE — Telephone Encounter (Signed)
Pt aware.

## 2019-03-23 LAB — MISC LABCORP TEST (SEND OUT): Labcorp test code: 551776

## 2019-04-24 ENCOUNTER — Other Ambulatory Visit: Payer: Self-pay | Admitting: Gastroenterology

## 2019-04-25 ENCOUNTER — Other Ambulatory Visit: Payer: Self-pay | Admitting: Family Medicine

## 2019-05-16 ENCOUNTER — Telehealth: Payer: Self-pay | Admitting: Family Medicine

## 2019-05-16 NOTE — Telephone Encounter (Signed)
Pt dropped off job accommodation forms to be filled out.  She stated she went over time allowed on FMLA and needed form fill out   Paperwork in Dr Darnell Level in box

## 2019-05-18 ENCOUNTER — Ambulatory Visit: Payer: 59 | Attending: Infectious Diseases | Admitting: Infectious Diseases

## 2019-05-18 ENCOUNTER — Other Ambulatory Visit
Admission: RE | Admit: 2019-05-18 | Discharge: 2019-05-18 | Disposition: A | Discharge status: Home / Self Care | Payer: 59 | Source: Ambulatory Visit | Attending: Infectious Diseases | Admitting: Infectious Diseases

## 2019-05-18 ENCOUNTER — Encounter: Payer: Self-pay | Admitting: Infectious Diseases

## 2019-05-18 ENCOUNTER — Other Ambulatory Visit: Payer: Self-pay

## 2019-05-18 VITALS — BP 140/100 | HR 106 | Temp 98.1°F | Resp 16 | Ht 69.0 in | Wt 353.0 lb

## 2019-05-18 DIAGNOSIS — E559 Vitamin D deficiency, unspecified: Secondary | ICD-10-CM

## 2019-05-18 DIAGNOSIS — Z7984 Long term (current) use of oral hypoglycemic drugs: Secondary | ICD-10-CM

## 2019-05-18 DIAGNOSIS — B20 Human immunodeficiency virus [HIV] disease: Secondary | ICD-10-CM | POA: Insufficient documentation

## 2019-05-18 DIAGNOSIS — Z79899 Other long term (current) drug therapy: Secondary | ICD-10-CM

## 2019-05-18 DIAGNOSIS — R519 Headache, unspecified: Secondary | ICD-10-CM

## 2019-05-18 DIAGNOSIS — Z720 Tobacco use: Secondary | ICD-10-CM

## 2019-05-18 DIAGNOSIS — I10 Essential (primary) hypertension: Secondary | ICD-10-CM

## 2019-05-18 DIAGNOSIS — Z23 Encounter for immunization: Secondary | ICD-10-CM

## 2019-05-18 DIAGNOSIS — E119 Type 2 diabetes mellitus without complications: Secondary | ICD-10-CM | POA: Diagnosis not present

## 2019-05-18 LAB — COMPREHENSIVE METABOLIC PANEL
ALT: 36 U/L (ref 0–44)
AST: 30 U/L (ref 15–41)
Albumin: 3.9 g/dL (ref 3.5–5.0)
Alkaline Phosphatase: 80 U/L (ref 38–126)
Anion gap: 10 (ref 5–15)
BUN: 10 mg/dL (ref 6–20)
CO2: 26 mmol/L (ref 22–32)
Calcium: 9.1 mg/dL (ref 8.9–10.3)
Chloride: 102 mmol/L (ref 98–111)
Creatinine, Ser: 1.08 mg/dL — ABNORMAL HIGH (ref 0.44–1.00)
GFR calc Af Amer: >60 mL/min (ref >60)
GFR calc non Af Amer: >60 mL/min (ref >60)
Glucose, Bld: 188 mg/dL — ABNORMAL HIGH (ref 70–99)
Potassium: 4 mmol/L (ref 3.5–5.1)
Sodium: 138 mmol/L (ref 135–145)
Total Bilirubin: 1.1 mg/dL (ref 0.3–1.2)
Total Protein: 7.6 g/dL (ref 6.5–8.1)

## 2019-05-18 NOTE — Patient Instructions (Signed)
Today you are here for follow up We will not be able to do Dovato as you have resistance to the epivir and it will not work. Today you will get the Flu vaccine. Also your BP was initially 140/100 and then 121/94. You are having migraine today. Please check BP at home to make sure it does not remain elevated and if so discuss with your PCP Follow up 6 months

## 2019-05-18 NOTE — Progress Notes (Signed)
NAME: Cassandra [name]  DOB: 10-05-1975  MRN: [id number]  Date/Time: 05/18/2019 9:51 AM   Subjective:  Here for follow up of HIV  History of HIV on Biktarvy.  100% adherent to medication.  Last CD4 from March 2020 is 1284  Viral load is undetectable.   Pt here to explore the possibility of switching to a new regimen without TAF as she has gained clsoe to 40 pounds in 1 year  Medical history HIV diagnosed 2013 Nadir Cd4 >200 OI none HAARt history: first regimen Atripla 2nd regimen Biktarvy since Sept 2018  Acquired thru heterosexual contact. Husband HIV positive and on Rx   Past medical history Allergies to animal dander Gestational diabetes mellitus Diabetes mellitus GERD Hypertension Obesity Varicose veins Migraine Endometrial ablation for menorrhagia Gall stones Depression  Tubular adenoma colon.  Colonoscopy done in January 2020.   Past Surgical History:  Procedure Laterality Date  . BIOPSY  07/04/2018   Procedure: BIOPSY;  Surgeon: Thornton Park, MD;  Location: [facility];  Service: Gastroenterology;;  . CESAREAN SECTION  [phone]   x2  . CHOLECYSTECTOMY  2018  . COLONOSCOPY WITH PROPOFOL N/A 07/04/2018   for chronic diarrhea after cholecystectomy started on colestipol with benefit ([place]). Thought she has IBS-D. 2 TA, rpt 5 yrs  . HYSTEROSCOPY W/D&C N/A 01/05/2018   Minerva for heavy bleeding, IUD removed - DILATATION AND CURETTAGE /HYSTEROSCOPY WITH MINERVA ABLATION; [name], MD  . [place]  . POLYPECTOMY  07/04/2018   Procedure: POLYPECTOMY;  Surgeon: Thornton Park, MD;  Location: [facility];  Service: Gastroenterology;;  . TUBAL LIGATION    . WISDOM TOOTH EXTRACTION     x 4     SH Current smoker Occasional alcohol Lives with her husband [name] female partner  and 2 sons    Family History  Problem Relation Age of Onset  . Hypertension Mother   . Hypertension Father   . Appendicitis Father   . Atrial  fibrillation Father   . Diabetes Maternal Grandmother        s/p amputation  . Hyperlipidemia Maternal Grandmother   . Hypertension Maternal Grandmother   . Stroke Maternal Grandmother   . Hyperlipidemia Maternal Grandfather   . Hypertension Maternal Grandfather   . Skin cancer Maternal Grandfather   . Seizures Paternal Grandmother 18       deceased  . Coronary artery disease Paternal Grandfather 20       MI  . Breast cancer Neg Hx   . Colon cancer Neg Hx   . Colon polyps Neg Hx   . Esophageal cancer Neg Hx   . Rectal cancer Neg Hx   . Stomach cancer Neg Hx    No Known Allergies ? Current Outpatient Medications  Medication Sig Dispense Refill  . Accu-Chek FastClix Lancets MISC 1 each by Does not apply route as directed. Use as directed to check blood sugar 102 each 0  . acetaminophen (TYLENOL) 500 MG tablet Take 1,000 mg by mouth 2 (two) times daily as needed for moderate pain or headache.     Marland Kitchen amLODipine (NORVASC) 5 MG tablet TAKE 1 TABLET DAILY 90 tablet 3  . BIKTARVY 50-200-25 MG TABS tablet Take 1 tablet by mouth at bedtime. 30 tablet 4  . citalopram (CELEXA) 20 MG tablet TAKE 1 TABLET BY MOUTH EVERY DAY 90 tablet 1  . colestipol (COLESTID) 1 g tablet TAKE 2 TABLETS (2 G TOTAL) BY MOUTH 2 (TWO) TIMES DAILY. Pablo Pena  tablet 0  . Cyanocobalamin (B-12) 1000 MCG SUBL Place 1 tablet under the tongue daily.    Marland Kitchen glucose blood (ACCU-CHEK GUIDE) test strip 1 each by Other route as needed for other. Use as instructed to check blood sugar 100 each 0  . hydrochlorothiazide (HYDRODIURIL) 25 MG tablet TAKE 1 TABLET DAILY 90 tablet 3  . megestrol (MEGACE) 40 MG tablet TAKE 1 TABLET BY MOUTH TWICE A DAY 180 tablet 1  . Multiple Vitamin (MULTIVITAMIN WITH MINERALS) TABS tablet Take 1 tablet by mouth at bedtime.    . pantoprazole (PROTONIX) 40 MG tablet TAKE 1 TABLET BY MOUTH TWICE A DAY 180 tablet 0   No current facility-administered medications for this visit.    REVIEW OF SYSTEMS:  Const:  negative fever, negative chills, negative weight loss Eyes: negative diplopia or visual changes, negative eye pain ENT: negative coryza, negative sore throat Resp: negative cough, hemoptysis, dyspnea Cards: negative for chest pain, palpitations, lower extremity edema GU: negative for frequency, dysuria and hematuria Skin: negative for rash and pruritus Heme: negative for easy bruising and gum/nose bleeding MS: negative for myalgias, arthralgias, back pain and muscle weakness Neurolo: headaches, dizziness, NO vertigo, memory problems  Psych:stress   Objective:  VITALS:  BP (!) 140/100   Pulse (!) 106   Temp 98.1 F (36.7 C) (Oral)   Resp 16   Ht 5\' 9"  (1.753 m)   Wt (!) 353 lb (160.1 kg)   SpO2 97%   BMI 52.13 kg/m    Repeat Bp 121/94 HR 106  PHYSICAL EXAM:  General: In some distress due to headache Lungs: Clear to auscultation bilaterally. No Wheezing or Rhonchi. No rales. Heart: Regular rate and rhythm, no murmur, rub or gallop. Abdomen: Soft, non-tender,not distended. Bowel sounds normal. No masses Extremities: Extremities normal, atraumatic, no cyanosis. No edema. No clubbing Skin: No rashes or lesions. Not Jaundiced Lymph: Cervical, supraclavicular normal. Neurologic: Grossly non-focal Pertinent Labs Health maintenance:  Vaccination  Vaccine Date last given comment  Influenza 05/18/19   Hepatitis B 3/10, 5/19 and 02/16/19   Hepatitis A 08/16/18 and 02/16/19   Prevnar-PCV-13    Pneumovac-PPSV-23 01/04/15   TdaP 12/14/12   HPV    Shingrix ( zoster vaccine)     ______________________  Labs Lab Result  Date comment  HIV VL  <20    CD4 1284(64.2%) 08/16/18   Genotype     [id number] Neg 08/16/2018   HIV antibody     RPR  nonreactive 08/16/2018   Quantiferon Gold  negative 08/16/2018   Hep C ab Neg 03/30/18   Hepatitis B-ab,ag,c Sag-Neg, Sab neg Cab neg 03/30/18   Hepatitis A-IgM, IgG /T Neg 03/30/18   Lipid TC 170, HDL 41, LDL 108 TGL 99 11/26/17   GC/CHL      PAP            Preventive  Procedure Result  Date comment  colonoscopy Tubular adenoma 07/03/18   Mammogram Normal 11/26/17   Dental exam     Opthal       Impression/Recommendation 43 y.o. female with a history of HIV, hypertension               ? ?HIV under good control.  On Biktarvy.  100% adherent.  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

## 2019-05-19 LAB — T-HELPER CELLS CD4/CD8 %
% CD 4 Pos. Lymph.: 54.9 % (ref 30.8–58.5)
Absolute CD 4 Helper: 1263 /uL (ref 359–1519)
Basophils Absolute: 0 10*3/uL (ref 0.0–0.2)
Basos: 1 %
CD3+CD4+ Cells/CD3+CD8+ Cells Bld: 2.46 (ref 0.92–3.72)
CD3+CD8+ Cells # Bld: 513 /uL (ref 109–897)
CD3+CD8+ Cells NFr Bld: 22.3 % (ref 12.0–35.5)
EOS (ABSOLUTE): 0.2 10*3/uL (ref 0.0–0.4)
Eos: 3 %
Hematocrit: 42.3 % (ref 34.0–46.6)
Hemoglobin: 14.1 g/dL (ref 11.1–15.9)
Immature Grans (Abs): 0 10*3/uL (ref 0.0–0.1)
Immature Granulocytes: 0 %
Lymphocytes Absolute: 2.3 10*3/uL (ref 0.7–3.1)
Lymphs: 31 %
MCH: 32 pg (ref 26.6–33.0)
MCHC: 33.3 g/dL (ref 31.5–35.7)
MCV: 96 fL (ref 79–97)
Monocytes Absolute: 0.5 10*3/uL (ref 0.1–0.9)
Monocytes: 7 %
Neutrophils Absolute: 4.5 10*3/uL (ref 1.4–7.0)
Neutrophils: 58 %
Platelets: 309 10*3/uL (ref 150–450)
RBC: 4.4 x10E6/uL (ref 3.77–5.28)
RDW: 11.6 % — ABNORMAL LOW (ref 11.7–15.4)
WBC: 7.6 10*3/uL (ref 3.4–10.8)

## 2019-05-19 LAB — HIV-1 RNA QUANT-NO REFLEX-BLD
HIV 1 RNA Quant: <20 copies/mL
LOG10 HIV-1 RNA: UNDETERMINED log10copy/mL

## 2019-05-22 DIAGNOSIS — Z0279 Encounter for issue of other medical certificate: Secondary | ICD-10-CM

## 2019-05-22 NOTE — Telephone Encounter (Signed)
Filled and in my out box.  If ongoing diarrhea - do recommend she f/u with GI [facility] manager)

## 2019-05-22 NOTE — Telephone Encounter (Signed)
Left message asking pt to call office  Please see dr  Note  Let pt know paperwork is ready for pick up and she will need to turn in   Copy for pt Copy for scan Copy for billing

## 2019-05-23 ENCOUNTER — Other Ambulatory Visit: Payer: Self-pay | Admitting: Gastroenterology

## 2019-05-25 NOTE — Telephone Encounter (Signed)
Pt is aware of dr g comment Pt is aware she will need to turn in paperwork

## 2019-06-06 ENCOUNTER — Other Ambulatory Visit: Payer: Self-pay | Admitting: Infectious Diseases

## 2019-06-06 DIAGNOSIS — B20 Human immunodeficiency virus [HIV] disease: Secondary | ICD-10-CM

## 2019-06-06 MED ORDER — BIKTARVY 50-200-25 MG PO TABS: 1.0000 | ORAL_TABLET | Freq: Every day | ORAL | 4 refills | Status: DC

## 2019-06-21 ENCOUNTER — Encounter: Payer: Self-pay | Admitting: Gastroenterology

## 2019-06-21 ENCOUNTER — Ambulatory Visit (INDEPENDENT_AMBULATORY_CARE_PROVIDER_SITE_OTHER): Payer: BC Managed Care – PPO | Admitting: Gastroenterology

## 2019-06-21 ENCOUNTER — Other Ambulatory Visit: Payer: BC Managed Care – PPO

## 2019-06-21 VITALS — BP 120/90 | HR 100 | Temp 97.4°F | Ht 69.0 in | Wt 347.0 lb

## 2019-06-21 DIAGNOSIS — R1031 Right lower quadrant pain: Secondary | ICD-10-CM

## 2019-06-21 DIAGNOSIS — G8929 Other chronic pain: Secondary | ICD-10-CM

## 2019-06-21 DIAGNOSIS — K529 Noninfective gastroenteritis and colitis, unspecified: Secondary | ICD-10-CM | POA: Diagnosis not present

## 2019-06-21 NOTE — Patient Instructions (Addendum)
Continue your current medications.   We have given you samples of IB gard.   Follow up as needed.   You will be due for a recall colonoscopy in 2025. We will send you a reminder in the mail when it gets closer to that time.   I value your feedback and thank you for entrusting [place] with your care. If you get a [place] patient survey, I would appreciate you taking the time to let us know about your experience today. Thank you!   Thank you for trusting me with your gastrointestinal care!    Thornton Park, MD, MPH

## 2019-06-21 NOTE — Progress Notes (Signed)
Referring Provider: Ria Bush, MD Primary Care Physician:  Ria Bush, MD   Chief complaint:  Abdominal pain and diarrhea   IMPRESSION:  RUQ abdominal pain and post-prandial diarrhea    - normal colon biopsies 07/04/18    - Fecal calprotectin and ESR were mildly elevated 04/2018    - CRP normal    - TTGA and IgA negative for celiac    - improved on colestipol Intermittent RLQ pain - ? etiology Gas, eructation, and flatus - ? SIBO v    - improved with 2 weeks of rifaximin 550 mg TID Fecal calprotectin of 153, elevated ESR History of colon polyps    - ascending TA and descending TA removed 07/04/2018    - surveillance colonoscopy due 2025 BMI of 51 Hepatic steatosis by ultrasound and CT Cholecystectomy 2018 for choledocholithiasis  Differential for diarrhea continues to include bile acid diarrhea, variant of functional GI disorder such as post prandial diarrhea syndrome, or food allergies/intolerances.  Despite elevated fecal calprotectin, no evidence for colonic or terminal ileum IBD. Will repeat fecal calprotectin now. Thankfully, she has experienced clinical improvement with Citrucel and colestipol.   Given her abdominal pain, will see if symptoms respond to IBGard. Would have a low threshold to consider small bowel enterography given the location of her symptoms and prior elevated fecal calprotectin and ESR.      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 The interval history is obtained through the patient. Her husband accompanies her to this appointment. She has HIV on Biktarvy, hypertension, obesity with weight gain since starting Biktarvy, migraines,  diabetes, B12 deficiency, and depression.   Colonoscopy 07/04/2026 revealed diverticulosis in the sigmoid and descending colon, a descending colon tubular adenoma, an ascending tubular adenoma and was otherwise normal.  Random biopsies from the colon and terminal ileum were normal  Labs from 04/29/18: Sedimentation rate was 60.  CRP was normal at 4.7.  T TGA and IgA were negative for celiac.  Fecal calprotectin was elevated at 153.  (1) Eructation and bloating improved with rifaxamin 550 mg TID x 2 weeks.  (2) Reflux improved on pantoprazole 40 mg BID.  (3) Diarrhea.  Did not improve on Xifaxan. Worsened on metformin, which has been discontinued since that time. Better on colestipol 2g QAM and Citrucel daily. She developed constipation with BID dosing. If she doesn't use the colestipol, she will have post-prandial diarrhea that continues to occur 30 minutes after eating. Most pronounced after fatty food and meat.   At times, will have up to 5 postprandial BM daily.  Particular worse after eating out. Associated RUQ and LUQ pain has improved.   (4) Reporting intermittent RLQ/right groin pain. Sharp. Worried it could be her appendix. Not associated with eating, movement, or defecation. Concerned given her history of endometriosis.    Past Medical History:  Diagnosis Date  . Allergy   . 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 Anxiety   . Constipation    alternating from constipation to diarrhea  . Depression   . Gallstones 06/2016   by xray - surgery to remove  . Generalized headaches    frequent  . GERD (gastroesophageal reflux disease)   .  History of abnormal Pap smear    remote  . History of gestational diabetes    first 2 pregnancies  . 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[place])    CD4 level is 1100 per patient  . HSV-2 seropositive   . Hypertension   . Kidney stones   . Morbid obesity ([place])   . Periodontal disease 08/2011   currently getting dental work  . Tobacco use     Past Surgical  History:  Procedure Laterality Date  . BIOPSY  07/04/2018   Procedure: BIOPSY;  Surgeon: Thornton Park, MD;  Location: [facility];  Service: Gastroenterology;;  . CESAREAN SECTION  [phone]   x2  . CHOLECYSTECTOMY  2018  . COLONOSCOPY WITH PROPOFOL N/A 07/04/2018   for chronic diarrhea after cholecystectomy started on colestipol with benefit ([place]). Thought she has IBS-D. 2 TA, rpt 5 yrs  . HYSTEROSCOPY WITH D & C N/A 01/05/2018   [place]a for heavy bleeding, IUD removed - DILATATION AND CURETTAGE /HYSTEROSCOPY WITH MINERVA ABLATION; [name], MD  . [place]  . POLYPECTOMY  07/04/2018   Procedure: POLYPECTOMY;  Surgeon: Thornton Park, MD;  Location: [facility];  Service: Gastroenterology;;  . TUBAL LIGATION    . WISDOM TOOTH EXTRACTION     x 4    Current Outpatient Medications  Medication Sig Dispense Refill  . Accu-Chek FastClix Lancets MISC 1 each by Does not apply route as directed. Use as directed to check blood sugar 102 each 0  . acetaminophen (TYLENOL) 500 MG tablet Take 1,000 mg by mouth 2 (two) times daily as needed for moderate pain or headache.     Marland Kitchen amLODipine (NORVASC) 5 MG tablet TAKE 1 TABLET DAILY 90 tablet 3  . BIKTARVY 50-200-25 MG TABS tablet Take 1 tablet by mouth at bedtime. 30 tablet 4  . citalopram (CELEXA) 20 MG tablet TAKE 1 TABLET BY MOUTH EVERY DAY 90 tablet 1  . colestipol (COLESTID) 1 g tablet TAKE 2 TABLETS (2 G TOTAL) BY MOUTH 2 (TWO) TIMES DAILY. 360 tablet 0  . Cyanocobalamin (B-12) 1000 MCG SUBL Place 1 tablet under the tongue daily.    Marland Kitchen glucose blood (ACCU-CHEK GUIDE) test strip 1 each by Other route as needed for other. Use as instructed to check blood sugar 100 each 0  . hydrochlorothiazide (HYDRODIURIL) 25 MG tablet TAKE 1 TABLET DAILY 90 tablet 3  . megestrol (MEGACE) 40 MG tablet TAKE 1 TABLET BY MOUTH TWICE A DAY 180 tablet 1  . Multiple Vitamin (MULTIVITAMIN WITH MINERALS) TABS tablet Take 1  tablet by mouth at bedtime.    . pantoprazole (PROTONIX) 40 MG tablet TAKE 1 TABLET BY MOUTH TWICE A DAY 180 tablet 0   No current facility-administered medications for this visit.    Allergies as of 06/21/2019  . (No Known Allergies)    Family History  Problem Relation Age of Onset  . Hypertension Mother   . Hypertension Father   . Appendicitis Father   . Atrial fibrillation Father   . Diabetes Maternal Grandmother        s/p amputation  . Hyperlipidemia Maternal Grandmother   . Hypertension Maternal Grandmother   . Stroke Maternal Grandmother   . Hyperlipidemia Maternal Grandfather   . Hypertension Maternal Grandfather   . Skin cancer Maternal Grandfather   . Seizures Paternal Grandmother 43       deceased  . Coronary artery disease Paternal Grandfather 33       MI  .  Breast cancer Neg Hx   . Colon cancer Neg Hx   . Colon polyps Neg Hx   . Esophageal cancer Neg Hx   . Rectal cancer Neg Hx   . Stomach cancer Neg Hx     Social History   Socioeconomic History  . Marital status: Married    Spouse name: Not on file  . Number of children: 2  . Years of education: Not on file  . Highest education level: Not on file  Occupational History  . Occupation: CSR  Tobacco Use  . Smoking status: Former Smoker    Packs/day: 0.10    Years: 23.00    Pack years: 2.30    Types: Cigarettes    Quit date: 12/20/2018    Years since quitting: 0.5  . Smokeless tobacco: Never Used  . Tobacco comment: as of 04/27/18 working on quitting  Substance and Sexual Activity  . Alcohol use: Yes    Alcohol/week: 0.0 standard drinks    Comment: occasional  . Drug use: No  . Sexual activity: Yes    Partners: Male    Birth control/protection: Surgical, I.U.D., Pill    Comment: Mirena IUD  Other Topics Concern  . Not on file  Social History Narrative   Lives with husband and 2 children, no pets   Occupation: call center rep   Edu: some college   Activity: no regular exercise.  Tries to  walk around building.   Diet: 1 mt dew in am, rest water, fruits/vegetables daily    Social Determinants of Health   Financial Resource Strain:   . Difficulty of Paying Living Expenses: Not on file  Food Insecurity:   . Worried About Charity fundraiser in the Last Year: Not on file  . Ran Out of Food in the Last Year: Not on file  Transportation Needs:   . Lack of Transportation (Medical): Not on file  . Lack of Transportation (Non-Medical): Not on file  Physical Activity:   . Days of Exercise per Week: Not on file  . Minutes of Exercise per Session: Not on file  Stress:   . Feeling of Stress : Not on file  Social Connections:   . Frequency of Communication with Friends and Family: Not on file  . Frequency of Social Gatherings with Friends and Family: Not on file  . Attends Religious Services: Not on file  . Active Member of Clubs or Organizations: Not on file  . Attends Archivist Meetings: Not on file  . Marital Status: Not on file  Intimate Partner Violence:   . Fear of Current or Ex-Partner: Not on file  . Emotionally Abused: Not on file  . Physically Abused: Not on file  . Sexually Abused: Not on file   There were no vitals filed for this visit.  Physical Exam: Vital signs were reviewed. General:   Alert, well-nourished, pleasant and cooperative in NAD Head:  Normocephalic and atraumatic. Eyes:  Sclera clear, no icterus.   Conjunctiva pink. Mouth:  No deformity or lesions.   Neck:  Supple; no thyromegaly. Lungs:  Clear throughout to auscultation.   No wheezes.  Heart:  Regular rate and rhythm; no murmurs Abdomen:  Central obesity, soft, nontender, normal bowel sounds. No rebound or guarding. No hepatosplenomegaly Neurologic:  Alert and  oriented x4;  grossly nonfocal Psych:  Alert and cooperative. Normal mood and affect.   Evertt Chouinard L. Tarri Glenn Md, MPH [facility] 06/21/2019, 11:10 AM

## 2019-06-27 ENCOUNTER — Ambulatory Visit: Payer: 59 | Admitting: Family Medicine

## 2019-06-30 ENCOUNTER — Ambulatory Visit (INDEPENDENT_AMBULATORY_CARE_PROVIDER_SITE_OTHER): Payer: BC Managed Care – PPO | Admitting: Family Medicine

## 2019-06-30 ENCOUNTER — Other Ambulatory Visit: Payer: Self-pay

## 2019-06-30 ENCOUNTER — Encounter: Payer: Self-pay | Admitting: Family Medicine

## 2019-06-30 VITALS — BP 120/88 | HR 89 | Temp 98.0°F | Ht 69.0 in | Wt 350.1 lb

## 2019-06-30 DIAGNOSIS — E118 Type 2 diabetes mellitus with unspecified complications: Secondary | ICD-10-CM | POA: Diagnosis not present

## 2019-06-30 DIAGNOSIS — I10 Essential (primary) hypertension: Secondary | ICD-10-CM

## 2019-06-30 DIAGNOSIS — E1165 Type 2 diabetes mellitus with hyperglycemia: Secondary | ICD-10-CM | POA: Diagnosis not present

## 2019-06-30 DIAGNOSIS — IMO0002 Reserved for concepts with insufficient information to code with codable children: Secondary | ICD-10-CM

## 2019-06-30 DIAGNOSIS — Z6841 Body Mass Index (BMI) 40.0 and over, adult: Secondary | ICD-10-CM

## 2019-06-30 DIAGNOSIS — K529 Noninfective gastroenteritis and colitis, unspecified: Secondary | ICD-10-CM

## 2019-06-30 DIAGNOSIS — G43109 Migraine with aura, not intractable, without status migrainosus: Secondary | ICD-10-CM

## 2019-06-30 DIAGNOSIS — G43909 Migraine, unspecified, not intractable, without status migrainosus: Secondary | ICD-10-CM | POA: Insufficient documentation

## 2019-06-30 DIAGNOSIS — F331 Major depressive disorder, recurrent, moderate: Secondary | ICD-10-CM | POA: Diagnosis not present

## 2019-06-30 LAB — POCT GLYCOSYLATED HEMOGLOBIN (HGB A1C): Hemoglobin A1C: 7.6 % — AB (ref 4.0–5.6)

## 2019-06-30 MED ORDER — OZEMPIC (0.25 OR 0.5 MG/DOSE) 2 MG/1.5ML ~~LOC~~ SOPN: PEN_INJECTOR | SUBCUTANEOUS | 3 refills | Status: AC

## 2019-06-30 MED ORDER — TOPIRAMATE 25 MG PO TABS: 25.0000 mg | ORAL_TABLET | Freq: Every day | ORAL | 6 refills | Status: DC

## 2019-06-30 MED ORDER — CYCLOBENZAPRINE HCL 5 MG PO TABS: 5.0000 mg | ORAL_TABLET | Freq: Two times a day (BID) | ORAL | 3 refills | Status: DC | PRN

## 2019-06-30 NOTE — Assessment & Plan Note (Signed)
Chronic, stable. Continue current regimen. 

## 2019-06-30 NOTE — Assessment & Plan Note (Signed)
Chronic, uncontrolled. Will refer to counseling. No med changes at this time.

## 2019-06-30 NOTE — Assessment & Plan Note (Signed)
Doing better on current regimen (colestipol, citrucel, and IBGard) and off metformin.

## 2019-06-30 NOTE — Assessment & Plan Note (Addendum)
Chronic, deteriorated off metformin - but unable to tolerate even XR formulation. Will price out ozempic. Reviewed mechanism of action of weekly GLP1 RA. No fmhx thyroid cancer. RTC 3 mo f/u visit. Will also refer to DSME.

## 2019-06-30 NOTE — Assessment & Plan Note (Addendum)
Flexeril refilled for abortive treatment.  Consider trial topamax ppx. Reviewed side effects to watch for.  To start after ozempic.  Contraception: s/p BTL

## 2019-06-30 NOTE — Progress Notes (Signed)
This visit was conducted in person.  BP 120/88 (BP Location: Left Arm, Patient Position: Sitting, Cuff Size: Large) Comment (Cuff Size): thigh  Pulse 89   Temp 98 F (36.7 C) (Temporal)   Ht 5\' 9"  (1.753 m)   Wt (!) 350 lb 1 oz (158.8 kg)   SpO2 98%   BMI 51.70 kg/m    CC: 6 mo /fu visit Subjective:    Patient ID: Cassandra [name], female    DOB: [birth year], 44 y.o.   MRN: [id number]  HPI: Cassandra [name] is a 44 y.o. female presenting on 06/30/2019 for Follow-up (Here for 6 mo f/u.  Requests rx for Flexeril for HA. )   Chronic postprandial diarrhea - saw GI last week, note reviewed. Improvement on colestipol + citrucel and off metformin. Diarrhea thought bile acid diarrhea vs variant of functional GI disorder. ?SIBO cause of RUQ discomfort with gassiness. Trial IBGard. rec continue protonix 40mg  BID. She feels she's doing well with IBGard and 2 colestipol in am, sometimes needs to dose 1 extra colestipol at night.   Migraines - requests flexeril refill. Describes bitemporal headaches occasionally behind R eye or at occiput. Pain can be sharp or dull. Activity limiting pain, with photo- and phono-phobia, no nausea with this. HA can last for days. Getting 1-2 a week. Has not been on daily medication for migraine prevention. Migraines started after last child birth (2009). Stress (getting upset) and bright lights and loud high pitched noises are trigger. + aura - describes floaters in vision. Interested in daily medication.  Contraception: BTL  Worsening mood - despite celexa 20mg  daily. Struggles with social support as she's not around. Interested in counseling.   Last eye exam was 2020 - normal exam.   DM - does regularly check sugars fasting - high 188, low 68. Normally doesn't eat breakfast, limits carbs and sugars and ice-cream. Drinks 1 mt dew/day, mostly water. Compliant with antihyperglycemic regimen which includes: diet controlled. Even extended release metformin caused  diarrhea. Denies low sugars or hypoglycemic symptoms. Denies paresthesias. Last diabetic eye exam 2020. Pneumovax: 2016. Prevnar: not due. Glucometer brand: accuchek. DSME: interested. No fmhx thyroid cancer.  Lab Results  Component Value Date   HGBA1C 7.6 (A) 06/30/2019   Diabetic Foot Exam - Simple   No data filed     Lab Results  Component Value Date   MICROALBUR 1.9 12/23/2018        Relevant past medical, surgical, family and social history reviewed and updated as indicated. Interim medical history since our last visit reviewed. Allergies and medications reviewed and updated. Outpatient Medications Prior to Visit  Medication Sig Dispense Refill  . Accu-Chek FastClix Lancets MISC 1 each by Does not apply route as directed. Use as directed to check blood sugar 102 each 0  . acetaminophen (TYLENOL) 500 MG tablet Take 1,000 mg by mouth 2 (two) times daily as needed for moderate pain or headache.     Marland Kitchen amLODipine (NORVASC) 5 MG tablet TAKE 1 TABLET DAILY 90 tablet 3  . BIKTARVY 50-200-25 MG TABS tablet Take 1 tablet by mouth at bedtime. 30 tablet 4  . citalopram (CELEXA) 20 MG tablet TAKE 1 TABLET BY MOUTH EVERY DAY 90 tablet 1  . colestipol (COLESTID) 1 g tablet TAKE 2 TABLETS (2 G TOTAL) BY MOUTH 2 (TWO) TIMES DAILY. 360 tablet 0  . Cyanocobalamin (B-12) 1000 MCG SUBL Place 1 tablet under the tongue daily.    Marland Kitchen glucose blood (ACCU-CHEK GUIDE) test strip  1 each by Other route as needed for other. Use as instructed to check blood sugar 100 each 0  . hydrochlorothiazide (HYDRODIURIL) 25 MG tablet TAKE 1 TABLET DAILY 90 tablet 3  . megestrol (MEGACE) 40 MG tablet TAKE 1 TABLET BY MOUTH TWICE A DAY 180 tablet 1  . Multiple Vitamin (MULTIVITAMIN WITH MINERALS) TABS tablet Take 1 tablet by mouth at bedtime.    . pantoprazole (PROTONIX) 40 MG tablet TAKE 1 TABLET BY MOUTH TWICE A DAY 180 tablet 0  . Peppermint Oil (IBGARD PO) Take 2 tablets by mouth daily.     No facility-administered  medications prior to visit.     Per HPI unless specifically indicated in ROS section below Review of Systems Objective:    BP 120/88 (BP Location: Left Arm, Patient Position: Sitting, Cuff Size: Large) Comment (Cuff Size): thigh  Pulse 89   Temp 98 F (36.7 C) (Temporal)   Ht 5\' 9"  (1.753 m)   Wt (!) 350 lb 1 oz (158.8 kg)   SpO2 98%   BMI 51.70 kg/m   Wt Readings from Last 3 Encounters:  06/30/19 (!) 350 lb 1 oz (158.8 kg)  06/21/19 (!) 347 lb (157.4 kg)  05/18/19 (!) 353 lb (160.1 kg)    Physical Exam Vitals and nursing note reviewed.  Constitutional:      Appearance: Normal appearance. She is obese. She is not ill-appearing.  Eyes:     Extraocular Movements: Extraocular movements intact.     Pupils: Pupils are equal, round, and reactive to light.  Cardiovascular:     Rate and Rhythm: Normal rate and regular rhythm.     Pulses: Normal pulses.     Heart sounds: Normal heart sounds. No murmur.  Pulmonary:     Effort: Pulmonary effort is normal. No respiratory distress.     Breath sounds: Normal breath sounds. No wheezing, rhonchi or rales.  Musculoskeletal:     Right lower leg: No edema.     Left lower leg: No edema.  Neurological:     Mental Status: She is alert.  Psychiatric:        Mood and Affect: Mood normal.        Behavior: Behavior normal.       Results for orders placed or performed in visit on 06/30/19  POCT glycosylated hemoglobin (Hb A1C)  Result Value Ref Range   Hemoglobin A1C 7.6 (A) 4.0 - 5.6 %   HbA1c POC (<> result, manual entry)     HbA1c, POC (prediabetic range)     HbA1c, POC (controlled diabetic range)     Assessment & Plan:  This visit occurred during the SARS-CoV-2 public health emergency.  Safety protocols were in place, including screening questions prior to the visit, additional usage of staff PPE, and extensive cleaning of exam room while observing appropriate contact time as indicated for disinfecting solutions.   Problem List  Items Addressed This Visit    Uncontrolled diabetes mellitus with complication, without long-term current use of insulin ([place]) - Primary    Chronic, deteriorated off metformin - but unable to tolerate even XR formulation. Will price out ozempic. Reviewed mechanism of action of weekly GLP1 RA. No fmhx thyroid cancer. RTC 3 mo f/u visit. Will also refer to DSME.       Relevant Medications   Semaglutide,0.25 or 0.5MG /DOS, (OZEMPIC, 0.25 OR 0.5 MG/DOSE,) 2 MG/1.5ML SOPN   Other Relevant Orders   POCT glycosylated hemoglobin (Hb A1C) (Completed)   Ambulatory referral to diabetic  education   Morbid obesity with BMI of 50.0-59.9, adult ([place])    Encouraged ongoing efforts at healthy diet and lifestyle to affect sustainable weight loss. Will monitor effect of ozempic on weight.       Relevant Medications   Semaglutide,0.25 or 0.5MG /DOS, (OZEMPIC, 0.25 OR 0.5 MG/DOSE,) 2 MG/1.5ML SOPN   Migraine    Flexeril refilled for abortive treatment.  Consider trial topamax ppx. Reviewed side effects to watch for.  To start after ozempic.  Contraception: s/p BTL      Relevant Medications   cyclobenzaprine (FLEXERIL) 5 MG tablet   topiramate (TOPAMAX) 25 MG tablet   MDD (major depressive disorder), recurrent episode, moderate ([facility])    Chronic, uncontrolled. Will refer to counseling. No med changes at this time.       Hypertension    Chronic, stable. Continue current regimen.       Chronic diarrhea    Doing better on current regimen (colestipol, citrucel, and IBGard) and off metformin.           Meds ordered this encounter  Medications  . cyclobenzaprine (FLEXERIL) 5 MG tablet    Sig: Take 1 tablet (5 mg total) by mouth 2 (two) times daily as needed (headache (sedation precautions)).    Dispense:  30 tablet    Refill:  3  . Semaglutide,0.25 or 0.5MG /DOS, (OZEMPIC, 0.25 OR 0.5 MG/DOSE,) 2 MG/1.5ML SOPN    Sig: Inject 0.25 mg into the skin once a week for 14 days, THEN 0.5 mg once a week.     Dispense:  1 pen    Refill:  3  . topiramate (TOPAMAX) 25 MG tablet    Sig: Take 1 tablet (25 mg total) by mouth at bedtime.    Dispense:  30 tablet    Refill:  6   Orders Placed This Encounter  Procedures  . Ambulatory referral to diabetic education    Referral Priority:   Routine    Referral Type:   Consultation    Referral Reason:   Specialty Services Required    Number of Visits Requested:   1  . POCT glycosylated hemoglobin (Hb A1C)    Patient Instructions  We will request latest eye exam from [facility] doctor office.  We will refer you back for diabetes education classes.  We will refer you for counseling.  Start topamax 25mg  nightly for migraine prevention.  Price out ozempic 0.25mg  weekly for 2 weeks then increase to 0.5mg  weekly.  Return in 3-4 months for follow up visit.    Follow up plan: Return in about 3 months (around 09/28/2019) for follow up visit.  Ria Bush, MD

## 2019-06-30 NOTE — Assessment & Plan Note (Signed)
Encouraged ongoing efforts at healthy diet and lifestyle to affect sustainable weight loss. Will monitor effect of ozempic on weight.

## 2019-06-30 NOTE — Patient Instructions (Addendum)
We will request latest eye exam from [place] doctor office.  We will refer you back for diabetes education classes.  We will refer you for counseling.  Start topamax 25mg  nightly for migraine prevention.  Price out ozempic 0.25mg  weekly for 2 weeks then increase to 0.5mg  weekly.  Return in 3-4 months for follow up visit.

## 2019-07-18 ENCOUNTER — Other Ambulatory Visit: Payer: Self-pay | Admitting: Gastroenterology

## 2019-07-31 ENCOUNTER — Other Ambulatory Visit: Payer: Self-pay

## 2019-07-31 ENCOUNTER — Encounter: Payer: BC Managed Care – PPO | Attending: Family Medicine | Admitting: *Deleted

## 2019-07-31 ENCOUNTER — Encounter: Payer: Self-pay | Admitting: *Deleted

## 2019-07-31 VITALS — BP 110/70 | Ht 69.0 in | Wt 348.6 lb

## 2019-07-31 DIAGNOSIS — E118 Type 2 diabetes mellitus with unspecified complications: Secondary | ICD-10-CM | POA: Diagnosis not present

## 2019-07-31 DIAGNOSIS — E119 Type 2 diabetes mellitus without complications: Secondary | ICD-10-CM

## 2019-07-31 DIAGNOSIS — Z713 Dietary counseling and surveillance: Secondary | ICD-10-CM | POA: Diagnosis present

## 2019-07-31 NOTE — Patient Instructions (Signed)
Check blood sugars 2 x day before breakfast and 2 hrs after supper every day Bring blood sugar records to the next class  Exercise: Begin walking  for 10-15  minutes 3  days a week and gradually increase to 30 minutes 5 x week  Eat 3 meals day,  1-2  snacks a day Space meals 4-6 hours apart Don't skip meals Avoid sugar sweetened drinks (soda)  Return for classes on:

## 2019-08-01 NOTE — Progress Notes (Signed)
Diabetes Self-Management Education  Visit Type: First/Initial  Appt. Start Time: 1610 Appt. End Time: 1705  07/31/2019  [name], identified by name and date of birth, is a 44 y.o. female with a diagnosis of Diabetes: Type 2.   ASSESSMENT  Blood pressure 110/70, height 5\' 9"  (1.753 m), weight (!) 348 lb 9.6 oz (158.1 kg). Body mass index is 51.48 kg/m.  Diabetes Self-Management Education - 07/31/19 1716      Visit Information   Visit Type  First/Initial      Initial Visit   Diabetes Type  Type 2    Are you currently following a meal plan?  No    Are you taking your medications as prescribed?  Yes    Date Diagnosed  Jan 2021      Health Coping   How would you rate your overall health?  Fair      Psychosocial Assessment   Patient Belief/Attitude about Diabetes  Other (comment)   "it sucks, don't like it"   Self-care barriers  None    Self-management support  Doctor's office;Family    Patient Concerns  Nutrition/Meal planning;Glycemic Control;Weight Control    Special Needs  None    Preferred Learning Style  Visual;Hands on    Learning Readiness  Ready    How often do you need to have someone help you when you read instructions, pamphlets, or other written materials from your doctor or pharmacy?  1 - Never    What is the last grade level you completed in school?  Assoc degrree      Pre-Education Assessment   Patient understands the diabetes disease and treatment process.  Needs Instruction    Patient understands incorporating nutritional management into lifestyle.  Needs Instruction    Patient undertands incorporating physical activity into lifestyle.  Needs Instruction    Patient understands using medications safely.  Needs Instruction    Patient understands monitoring blood glucose, interpreting and using results  Needs Review    Patient understands prevention, detection, and treatment of acute complications.  Needs Instruction    Patient understands  prevention, detection, and treatment of chronic complications.  Needs Instruction    Patient understands how to develop strategies to address psychosocial issues.  Needs Instruction    Patient understands how to develop strategies to promote health/change behavior.  Needs Instruction      Complications   Last HgB A1C per patient/outside source  7.7 %   05/22/2019   How often do you check your blood sugar?  1-2 times/day    Postprandial Blood glucose range (mg/dL)  130-179;180-200   Pt reports pp's 150-200 mg/dL   Have you had a dilated eye exam in the past 12 months?  Yes    Have you had a dental exam in the past 12 months?  No    Are you checking your feet?  Yes    How many days per week are you checking your feet?  7      Dietary Intake   Breakfast  skips    Lunch  left overs; 2 chicken tacos - soft shell; chicken nuggets; meat sandwich and occasional chips    Dinner  burger, chicken, pork; potatoes, peas, corn, rice, pasta, green beans, carrots, cuccumbers, tomatoes, salads    Beverage(s)  soda      Exercise   Exercise Type  ADL's      Patient Education   Previous Diabetes Education  No    Disease state  Definition of diabetes, type 1 and 2, and the diagnosis of diabetes;Factors that contribute to the development of diabetes    Nutrition management   Role of diet in the treatment of diabetes and the relationship between the three main macronutrients and blood glucose level;Food label reading, portion sizes and measuring food.;Reviewed blood glucose goals for pre and post meals and how to evaluate the patients' food intake on their blood glucose level.    Physical activity and exercise   Role of exercise on diabetes management, blood pressure control and cardiac health.    Medications  Reviewed patients medication for diabetes, action, purpose, timing of dose and side effects.    Monitoring  Purpose and frequency of SMBG.;Taught/discussed recording of test results and interpretation  of SMBG.;Identified appropriate SMBG and/or A1C goals.    Chronic complications  Relationship between chronic complications and blood glucose control    Psychosocial adjustment  Identified and addressed patients feelings and concerns about diabetes      Individualized Goals (developed by patient)   Reducing Risk Improve blood sugars Lose weight     Outcomes   Expected Outcomes  Demonstrated interest in learning. Expect positive outcomes       Individualized Plan for Diabetes Self-Management Training:   Learning Objective:  Patient will have a greater understanding of diabetes self-management. Patient education plan is to attend individual and/or group sessions per assessed needs and concerns.   Plan:   Patient Instructions  Check blood sugars 2 x day before breakfast and 2 hrs after supper every day Bring blood sugar records to the next class Exercise: Begin walking  for 10-15  minutes 3  days a week and gradually increase to 30 minutes 5 x week Eat 3 meals day,  1-2  snacks a day Space meals 4-6 hours apart Don't skip meals Avoid sugar sweetened drinks (soda)  Expected Outcomes:  Demonstrated interest in learning. Expect positive outcomes  Education material provided:  General Meal Planning Guidelines Simple Meal Plan  If problems or questions, patient to contact team via:  Johny Drilling, [place], [place], CDE [phone]  Future DSME appointment:  August 07, 2019 for Diabetes Class 1

## 2019-08-07 ENCOUNTER — Encounter: Payer: Self-pay | Admitting: Dietician

## 2019-08-07 ENCOUNTER — Other Ambulatory Visit: Payer: Self-pay

## 2019-08-07 ENCOUNTER — Encounter: Payer: BC Managed Care – PPO | Attending: Family Medicine | Admitting: Dietician

## 2019-08-07 VITALS — Ht 69.0 in | Wt 348.3 lb

## 2019-08-07 DIAGNOSIS — Z713 Dietary counseling and surveillance: Secondary | ICD-10-CM | POA: Insufficient documentation

## 2019-08-07 DIAGNOSIS — E118 Type 2 diabetes mellitus with unspecified complications: Secondary | ICD-10-CM | POA: Insufficient documentation

## 2019-08-07 DIAGNOSIS — E119 Type 2 diabetes mellitus without complications: Secondary | ICD-10-CM

## 2019-08-07 NOTE — Progress Notes (Signed)
Appt. Start Time: 1730 Appt. End Time: 2000  Class 1 Diabetes Overview - define DM; state own type of DM; identify functions of pancreas and insulin; define insulin deficiency vs insulin resistance  Psychosocial - identify DM as a source of stress; state the effects of stress on BG control  Nutritional Management - describe effects of food on blood glucose; identify sources of carbohydrate, protein and fat; verbalize the importance of balance meals in controlling blood glucose  Exercise - describe the effects of exercise on blood glucose and importance of regular exercise in controlling diabetes; state a plan for personal exercise; verbalize contraindications for exercise  Self-Monitoring - state importance of SMBG; use SMBG results to effectively manage diabetes; identify importance of regular HbA1C testing and goals for results  Acute Complications - recognize hyperglycemia and hypoglycemia with causes and effects; identify blood glucose results as high, low or in control; list steps in treating and preventing high and low blood glucose  Chronic Complications/Foot, Skin, Eye Dental Care - identify possible long-term complications of diabetes (retinopathy, neuropathy, nephropathy, cardiovascular disease, infections); state importance of daily self-foot exams; describe how to examine feet and what to look for; explain appropriate eye and dental care  Lifestyle Changes/Goals & Health/Community Resources - state benefits of making appropriate lifestyle changes; identify habits that need to change (meals, tobacco, alcohol); identify strategies to reduce risk factors for personal health  Pregnancy/Sexual Health - define gestational diabetes; state importance of good blood glucose control and birth control prior to pregnancy; state importance of good blood glucose control in preventing sexual problems (impotence, vaginal dryness, infections, loss of desire); state relationship of blood glucose control  and pregnancy outcome; describe risk of maternal and fetal complications  Teaching Materials Used: Class 1 Slides/Notebook Diabetes Booklet ID Card  Medic Alert/Medic ID Forms Sleep Evaluation Exercise Handout Planning a Balanced Meal Goals for Class 1  

## 2019-09-11 ENCOUNTER — Encounter: Payer: Self-pay | Admitting: *Deleted

## 2019-09-11 ENCOUNTER — Other Ambulatory Visit: Payer: Self-pay

## 2019-09-11 ENCOUNTER — Encounter: Payer: BC Managed Care – PPO | Attending: Family Medicine | Admitting: *Deleted

## 2019-09-11 VITALS — Wt 346.6 lb

## 2019-09-11 DIAGNOSIS — Z713 Dietary counseling and surveillance: Secondary | ICD-10-CM | POA: Insufficient documentation

## 2019-09-11 DIAGNOSIS — E119 Type 2 diabetes mellitus without complications: Secondary | ICD-10-CM

## 2019-09-11 DIAGNOSIS — E118 Type 2 diabetes mellitus with unspecified complications: Secondary | ICD-10-CM | POA: Diagnosis not present

## 2019-09-11 NOTE — Progress Notes (Signed)

## 2019-09-18 ENCOUNTER — Encounter: Payer: BC Managed Care – PPO | Admitting: Dietician

## 2019-09-18 ENCOUNTER — Other Ambulatory Visit: Payer: Self-pay

## 2019-09-18 VITALS — BP 112/70 | Ht 69.0 in | Wt 347.9 lb

## 2019-09-18 DIAGNOSIS — Z713 Dietary counseling and surveillance: Secondary | ICD-10-CM | POA: Diagnosis not present

## 2019-09-18 DIAGNOSIS — E118 Type 2 diabetes mellitus with unspecified complications: Secondary | ICD-10-CM | POA: Diagnosis not present

## 2019-09-18 DIAGNOSIS — E119 Type 2 diabetes mellitus without complications: Secondary | ICD-10-CM

## 2019-09-18 NOTE — Progress Notes (Signed)

## 2019-09-22 ENCOUNTER — Other Ambulatory Visit: Payer: Self-pay

## 2019-09-22 ENCOUNTER — Encounter: Payer: Self-pay | Admitting: *Deleted

## 2019-10-03 ENCOUNTER — Other Ambulatory Visit: Payer: Self-pay

## 2019-10-03 ENCOUNTER — Encounter: Payer: Self-pay | Admitting: Family Medicine

## 2019-10-03 ENCOUNTER — Ambulatory Visit (INDEPENDENT_AMBULATORY_CARE_PROVIDER_SITE_OTHER): Payer: BC Managed Care – PPO | Admitting: Family Medicine

## 2019-10-03 VITALS — BP 136/110 | HR 100 | Temp 97.8°F | Ht 69.0 in | Wt 343.0 lb

## 2019-10-03 DIAGNOSIS — I10 Essential (primary) hypertension: Secondary | ICD-10-CM | POA: Diagnosis not present

## 2019-10-03 DIAGNOSIS — Z6841 Body Mass Index (BMI) 40.0 and over, adult: Secondary | ICD-10-CM

## 2019-10-03 DIAGNOSIS — E1165 Type 2 diabetes mellitus with hyperglycemia: Secondary | ICD-10-CM | POA: Diagnosis not present

## 2019-10-03 DIAGNOSIS — E118 Type 2 diabetes mellitus with unspecified complications: Secondary | ICD-10-CM | POA: Diagnosis not present

## 2019-10-03 DIAGNOSIS — IMO0002 Reserved for concepts with insufficient information to code with codable children: Secondary | ICD-10-CM

## 2019-10-03 LAB — POCT GLYCOSYLATED HEMOGLOBIN (HGB A1C): Hemoglobin A1C: 7.3 % — AB (ref 4.0–5.6)

## 2019-10-03 MED ORDER — OZEMPIC (0.25 OR 0.5 MG/DOSE) 2 MG/1.5ML ~~LOC~~ SOPN: 0.5000 mg | PEN_INJECTOR | SUBCUTANEOUS | 3 refills | Status: DC

## 2019-10-03 MED ORDER — ACCU-CHEK FASTCLIX LANCETS MISC: 1.0000 | 3 refills | Status: AC

## 2019-10-03 NOTE — Assessment & Plan Note (Addendum)
New elevation noted. Unclear cause. She will start monitoring blood pressures at home over the next week and update [place] with readings - if consistently elevated, will start ACEI.

## 2019-10-03 NOTE — Assessment & Plan Note (Signed)
Congratulated on weight loss to date.  Motivated to continue efforts.

## 2019-10-03 NOTE — Patient Instructions (Addendum)
We will request records from eye doctor ([facility]) across from [place].  Blood pressure is staying too high. Start monitoring regularly and call me in 1 week with readings. If staying high like today, we will start 3rd BP med.  Sugars are doing better! Congratulations - continue ozempic.  Return in 90months for physical

## 2019-10-03 NOTE — Assessment & Plan Note (Signed)
Chronic, improving control since starting ozempic - as well as associated weight loss. Continue this. She has seen nutritionist with benefit.

## 2019-10-03 NOTE — Progress Notes (Signed)
This visit was conducted in person.  BP (!) 136/110 (BP Location: Right Arm, Patient Position: Sitting, Cuff Size: Large)   Pulse 100   Temp 97.8 F (36.6 C) (Temporal)   Ht 5\' 9"  (1.753 m)   Wt (!) 343 lb (155.6 kg)   SpO2 97%   BMI 50.65 kg/m    BP Readings from Last 3 Encounters:  10/03/19 (!) 136/110  09/18/19 112/70  07/31/19 110/70  Elevated to 140/100 on repeat  CC: DM f/u visit  Subjective:    Patient ID: Cassandra [name], female    DOB: 1976-03-17, 44 y.o.   MRN: [id number]  HPI: Cassandra [name] is a 44 y.o. female presenting on 10/03/2019 for Diabetes (Here for 3-4 mo f/u.)   Quit her [employer] job on Friday - to start new one on Monday. 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   HTN - Compliant with current antihypertensive regimen of amlodipine 5mg  and hctz 25mg  daily.  Does check blood pressures at home but not recently. No low blood pressure readings or symptoms of dizziness/syncope.  Denies vision changes, CP/tightness, SOB, leg swelling. HA this morning.   DM - does regularly check sugars fasting 80-120, postprandial 150-180. Compliant with antihyperglycemic regimen which includes: ozempic 0.5mg  weekly (started 06/2019). Denies low sugars or hypoglycemic symptoms. Denies paresthesias. Last diabetic eye exam 2020 - due. Pneumovax: 2016. Prevnar: not due. Glucometer brand: accucheck. DSME: currently seeing nutritionist 3-09/2019.  Lab Results  Component Value Date   HGBA1C 7.3 (A) 10/03/2019   Diabetic Foot Exam - Simple   Simple Foot Form Diabetic Foot exam was performed with the following findings: Yes 10/03/2019 12:35 PM  Visual Inspection See comments: Yes Sensation Testing Intact to touch and monofilament testing bilaterally: Yes Pulse Check Posterior Tibialis and Dorsalis pulse intact bilaterally: Yes Comments Callus to R medial great toe    Lab Results  Component Value Date   MICROALBUR 1.9 12/23/2018          Relevant past medical, surgical, family and social history reviewed and updated as indicated. Interim medical history since our last visit reviewed. Allergies and medications reviewed and updated. Outpatient Medications Prior to Visit  Medication Sig Dispense Refill  . acetaminophen (TYLENOL) 500 MG tablet Take 1,000 mg by mouth 2 (two) times daily as needed for moderate pain or headache.     Marland Kitchen amLODipine (NORVASC) 5 MG tablet TAKE 1 TABLET DAILY 90 tablet 3  . BIKTARVY 50-200-25 MG TABS tablet Take 1 tablet by mouth at bedtime. 30 tablet 4  . citalopram (CELEXA) 20 MG tablet TAKE 1 TABLET BY MOUTH EVERY DAY 90 tablet 1  . colestipol (COLESTID) 1 g tablet TAKE 2 TABLETS (2 G TOTAL) BY MOUTH 2 (TWO) TIMES DAILY. 360 tablet 0  . Cyanocobalamin (B-12) 1000 MCG SUBL Place 1 tablet under the tongue daily.    . cyclobenzaprine (FLEXERIL) 5 MG tablet Take 1 tablet (5 mg total) by mouth 2 (two) times daily as needed (headache (sedation precautions)). 30 tablet 3  . glucose blood (ACCU-CHEK GUIDE) test strip 1 each by Other route as needed for other. Use as instructed to check blood sugar 100 each 0  . hydrochlorothiazide (HYDRODIURIL) 25 MG tablet TAKE 1 TABLET DAILY 90 tablet 3  . megestrol (MEGACE) 40 MG tablet TAKE 1 TABLET BY MOUTH TWICE A DAY 180 tablet 1  . Multiple Vitamin (MULTIVITAMIN WITH MINERALS) TABS tablet Take 1 tablet by mouth at bedtime.    Marland Kitchen  pantoprazole (PROTONIX) 40 MG tablet TAKE 1 TABLET BY MOUTH TWICE A DAY 180 tablet 0  . Peppermint Oil (IBGARD PO) Take 2 tablets by mouth daily.    Marland Kitchen topiramate (TOPAMAX) 25 MG tablet Take 1 tablet (25 mg total) by mouth at bedtime. 30 tablet 6  . Accu-Chek FastClix Lancets MISC 1 each by Does not apply route as directed. Use as directed to check blood sugar 102 each 0  . OZEMPIC, 0.25 OR 0.5 MG/DOSE, 2 MG/1.5ML SOPN INJECT 0.25 MG INTO THE SKIN ONCE A WEEK FOR 14 DAYS, THEN 0.5 MG ONCE A WEEK.     No facility-administered medications prior to  visit.     Per HPI unless specifically indicated in ROS section below Review of Systems Objective:  BP (!) 136/110 (BP Location: Right Arm, Patient Position: Sitting, Cuff Size: Large)   Pulse 100   Temp 97.8 F (36.6 C) (Temporal)   Ht 5\' 9"  (1.753 m)   Wt (!) 343 lb (155.6 kg)   SpO2 97%   BMI 50.65 kg/m   Wt Readings from Last 3 Encounters:  10/03/19 (!) 343 lb (155.6 kg)  09/18/19 (!) 347 lb 14.4 oz (157.8 kg)  09/11/19 (!) 346 lb 9.6 oz (157.2 kg)      Physical Exam Vitals and nursing note reviewed.  Constitutional:      General: She is not in acute distress.    Appearance: She is well-developed. She is morbidly obese. She is not ill-appearing.  Cardiovascular:     Rate and Rhythm: Normal rate and regular rhythm.     Pulses: Normal pulses.     Heart sounds: Normal heart sounds. No murmur.  Pulmonary:     Effort: Pulmonary effort is normal. No respiratory distress.     Breath sounds: Normal breath sounds. No wheezing, rhonchi or rales.  Musculoskeletal:     Cervical back: Normal range of motion and neck supple.     Right lower leg: No edema.     Left lower leg: No edema.     Comments: See HPI for foot exam if done  Lymphadenopathy:     Cervical: No cervical adenopathy.  Skin:    General: Skin is warm and dry.  Neurological:     Mental Status: She is alert.  Psychiatric:        Mood and Affect: Mood normal.        Behavior: Behavior normal.       Results for orders placed or performed in visit on 10/03/19  POCT glycosylated hemoglobin (Hb A1C)  Result Value Ref Range   Hemoglobin A1C 7.3 (A) 4.0 - 5.6 %   HbA1c POC (<> result, manual entry)     HbA1c, POC (prediabetic range)     HbA1c, POC (controlled diabetic range)     Assessment & Plan:  This visit occurred during the SARS-CoV-2 public health emergency.  Safety protocols were in place, including screening questions prior to the visit, additional usage of staff PPE, and extensive cleaning of exam room  while observing appropriate contact time as indicated for disinfecting solutions.   Problem List Items Addressed This Visit    Uncontrolled diabetes mellitus with complication, without long-term current use of insulin ([place]) - Primary    Chronic, improving control since starting ozempic - as well as associated weight loss. Continue this. She has seen nutritionist with benefit.       Relevant Medications   OZEMPIC, 0.25 OR 0.5 MG/DOSE, 2 MG/1.5ML SOPN   Other  Relevant Orders   POCT glycosylated hemoglobin (Hb A1C) (Completed)   Morbid obesity with BMI of 50.0-59.9, adult ([place])    Congratulated on weight loss to date.  Motivated to continue efforts.       Relevant Medications   OZEMPIC, 0.25 OR 0.5 MG/DOSE, 2 MG/1.5ML SOPN   Hypertension    New elevation noted. Unclear cause. She will start monitoring blood pressures at home over the next week and update [place] with readings - if consistently elevated, will start ACEI.           Meds ordered this encounter  Medications  . Accu-Chek FastClix Lancets MISC    Sig: 1 each by Does not apply route as directed. Use as directed to check blood sugar twice daily as needed. E11.8    Dispense:  102 each    Refill:  3  . OZEMPIC, 0.25 OR 0.5 MG/DOSE, 2 MG/1.5ML SOPN    Sig: Inject 0.5 mg into the skin once a week.    Dispense:  4.5 mL    Refill:  3   Orders Placed This Encounter  Procedures  . POCT glycosylated hemoglobin (Hb A1C)    Patient Instructions  We will request records from eye doctor ([facility]) across from [place].  Blood pressure is staying too high. Start monitoring regularly and call me in 1 week with readings. If staying high like today, we will start 3rd BP med.  Sugars are doing better! Congratulations - continue ozempic.  Return in 91months for physical   Follow up plan: Return in about 3 months (around 01/02/2020) for annual exam, prior fasting for blood work.  Ria Bush, MD

## 2019-10-16 ENCOUNTER — Ambulatory Visit: Payer: BC Managed Care – PPO

## 2019-10-16 ENCOUNTER — Other Ambulatory Visit: Payer: Self-pay | Admitting: Family Medicine

## 2019-10-16 ENCOUNTER — Telehealth: Payer: Self-pay

## 2019-10-16 DIAGNOSIS — I10 Essential (primary) hypertension: Secondary | ICD-10-CM

## 2019-10-16 MED ORDER — LISINOPRIL 10 MG PO TABS: 10.0000 mg | ORAL_TABLET | Freq: Every day | ORAL | 6 refills | Status: DC

## 2019-10-16 NOTE — Telephone Encounter (Signed)
BP staying too high.  Let's add lisinopril to her regimen - continue hctz 25 and amlodipine 5mg .  Lisinopril 10mg  sent to pharmacy. Call us in 2 wks with update on readings. Schedule lab visit in 10 days to check kidney function with starting lisinopril

## 2019-10-16 NOTE — Telephone Encounter (Addendum)
Spoke with pt relaying Dr. Synthia Innocent message.  Pt verbalizes understanding.  Will call back to schedule lab visit.

## 2019-10-16 NOTE — Telephone Encounter (Signed)
Pt called with BP readings that were requested from office note on 10/03/19; pt is presently taking amlodipine 5 mg one daily and HCTZ 25 mg taking one daily. Pt has not missed taking meds. Pt usually takes BP between 10 AM and 1 PM. 10/04/19  BP 159/97  P none 10/05/19  BP 167/97  P 119 10/05/19  rechecked BP at 6 PM BP 149/86  P 111 10/06/19  BP  149/106  P 118 10/07/19  pt not at home and did not take BP 10/08/19  BP 140/77  P none 10/09/19  BP 158/97  P 114 10/10/19  BP 173/105  P104 10/10/19  Rechecked BP 7 PM  BP 165/104   P 94 05/05-09/21 not ck BP. 10/16/19  BP 181/104  P 99 Pt is having H/A on and off daily; daily pain level usually 6-7.; H/A comes and goes. Pt is sensitive to light and noise when has H/A. Pt does not have a H/A now. Pt is not having any CP, dizziness, vision changes or SOB.  [facility]; pt request cb after reviewed by Dr Darnell Level.

## 2019-10-16 NOTE — Addendum Note (Signed)
Addended by: Ria Bush on: 10/16/2019 04:42 PM   Modules accepted: Orders

## 2019-11-05 ENCOUNTER — Other Ambulatory Visit: Payer: Self-pay | Admitting: Gastroenterology

## 2019-11-07 ENCOUNTER — Other Ambulatory Visit: Payer: Self-pay | Admitting: Infectious Diseases

## 2019-11-07 DIAGNOSIS — B20 Human immunodeficiency virus [HIV] disease: Secondary | ICD-10-CM

## 2019-11-07 MED ORDER — BIKTARVY 50-200-25 MG PO TABS: 1.0000 | ORAL_TABLET | Freq: Every day | ORAL | 5 refills | Status: DC

## 2019-11-16 ENCOUNTER — Ambulatory Visit: Payer: BC Managed Care – PPO | Attending: Infectious Diseases | Admitting: Infectious Diseases

## 2019-11-16 ENCOUNTER — Other Ambulatory Visit
Admission: RE | Admit: 2019-11-16 | Discharge: 2019-11-16 | Disposition: A | Discharge status: Home / Self Care | Payer: BC Managed Care – PPO | Source: Ambulatory Visit | Attending: Infectious Diseases | Admitting: Infectious Diseases

## 2019-11-16 ENCOUNTER — Encounter: Payer: Self-pay | Admitting: Infectious Diseases

## 2019-11-16 ENCOUNTER — Other Ambulatory Visit: Payer: Self-pay

## 2019-11-16 VITALS — BP 138/78 | HR 100 | Temp 97.9°F | Resp 16 | Ht 69.0 in | Wt 343.0 lb

## 2019-11-16 DIAGNOSIS — E669 Obesity, unspecified: Secondary | ICD-10-CM | POA: Diagnosis not present

## 2019-11-16 DIAGNOSIS — E538 Deficiency of other specified B group vitamins: Secondary | ICD-10-CM | POA: Insufficient documentation

## 2019-11-16 DIAGNOSIS — G43909 Migraine, unspecified, not intractable, without status migrainosus: Secondary | ICD-10-CM | POA: Diagnosis not present

## 2019-11-16 DIAGNOSIS — F17200 Nicotine dependence, unspecified, uncomplicated: Secondary | ICD-10-CM | POA: Insufficient documentation

## 2019-11-16 DIAGNOSIS — Z7984 Long term (current) use of oral hypoglycemic drugs: Secondary | ICD-10-CM | POA: Insufficient documentation

## 2019-11-16 DIAGNOSIS — Z8632 Personal history of gestational diabetes: Secondary | ICD-10-CM | POA: Diagnosis not present

## 2019-11-16 DIAGNOSIS — E119 Type 2 diabetes mellitus without complications: Secondary | ICD-10-CM | POA: Diagnosis not present

## 2019-11-16 DIAGNOSIS — Z79899 Other long term (current) drug therapy: Secondary | ICD-10-CM | POA: Diagnosis not present

## 2019-11-16 DIAGNOSIS — K219 Gastro-esophageal reflux disease without esophagitis: Secondary | ICD-10-CM | POA: Diagnosis not present

## 2019-11-16 DIAGNOSIS — Z8249 Family history of ischemic heart disease and other diseases of the circulatory system: Secondary | ICD-10-CM | POA: Diagnosis not present

## 2019-11-16 DIAGNOSIS — B20 Human immunodeficiency virus [HIV] disease: Secondary | ICD-10-CM | POA: Diagnosis not present

## 2019-11-16 DIAGNOSIS — Z833 Family history of diabetes mellitus: Secondary | ICD-10-CM | POA: Diagnosis not present

## 2019-11-16 DIAGNOSIS — Z888 Allergy status to other drugs, medicaments and biological substances status: Secondary | ICD-10-CM | POA: Insufficient documentation

## 2019-11-16 DIAGNOSIS — I10 Essential (primary) hypertension: Secondary | ICD-10-CM | POA: Diagnosis not present

## 2019-11-16 LAB — COMPREHENSIVE METABOLIC PANEL
ALT: 60 U/L — ABNORMAL HIGH (ref 0–44)
AST: 44 U/L — ABNORMAL HIGH (ref 15–41)
Albumin: 3.7 g/dL (ref 3.5–5.0)
Alkaline Phosphatase: 72 U/L (ref 38–126)
Anion gap: 5 (ref 5–15)
BUN: 10 mg/dL (ref 6–20)
CO2: 27 mmol/L (ref 22–32)
Calcium: 9.1 mg/dL (ref 8.9–10.3)
Chloride: 104 mmol/L (ref 98–111)
Creatinine, Ser: 1.07 mg/dL — ABNORMAL HIGH (ref 0.44–1.00)
GFR calc Af Amer: >60 mL/min (ref >60)
GFR calc non Af Amer: >60 mL/min (ref >60)
Glucose, Bld: 197 mg/dL — ABNORMAL HIGH (ref 70–99)
Potassium: 4.4 mmol/L (ref 3.5–5.1)
Sodium: 136 mmol/L (ref 135–145)
Total Bilirubin: 0.9 mg/dL (ref 0.3–1.2)
Total Protein: 7.4 g/dL (ref 6.5–8.1)

## 2019-11-16 LAB — HEPATITIS C ANTIBODY: HCV Ab: NONREACTIVE

## 2019-11-16 NOTE — Patient Instructions (Signed)
You are here for follow up- doing well with BP, DM, weight- Labs today. And follow up 6 months [place] vaccine scheduled for tomorrow

## 2019-11-16 NOTE — Progress Notes (Signed)
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     Medical history HIV diagnosed 2013 Nadir Cd4 >200 OI none HAARt history: first regimen Atripla 2nd regimen Biktarvy since Sept 2018  Acquired thru heterosexual contact. Husband HIV positive and on Rx   Past medical history Allergies to animal dander Gestational diabetes mellitus Diabetes mellitus GERD Hypertension Obesity Varicose veins Migraine Endometrial ablation for menorrhagia Gall stones Depression  Tubular adenoma colon.  Colonoscopy done in January 2020.   Past Surgical History:  Procedure Laterality Date  . BIOPSY  07/04/2018   Procedure: BIOPSY;  Surgeon: Thornton Park, MD;  Location: [facility];  Service: Gastroenterology;;  . CESAREAN SECTION  [phone]   x2  . CHOLECYSTECTOMY  2018  . COLONOSCOPY WITH PROPOFOL N/A 07/04/2018   for chronic diarrhea after cholecystectomy started on colestipol with benefit ([place]). Thought she has IBS-D. 2 TA, rpt 5 yrs  . HYSTEROSCOPY WITH D & C N/A 01/05/2018   [place] for heavy bleeding, IUD removed - DILATATION AND CURETTAGE /HYSTEROSCOPY WITH MINERVA ABLATION; [name], MD  . [place]  . POLYPECTOMY  07/04/2018   Procedure: POLYPECTOMY;  Surgeon: Thornton Park, MD;  Location: [facility];  Service: Gastroenterology;;  . TUBAL LIGATION    . WISDOM TOOTH EXTRACTION     x 4     SH Current smoker Occasional alcohol Lives with her husband [name] female partner  and 2 sons    Family History  Problem Relation Age of Onset  . Hypertension Mother   . Hypertension Father   .  Appendicitis Father   . Atrial fibrillation Father   . Diabetes Maternal Grandmother        s/p amputation  . Hyperlipidemia Maternal Grandmother   . Hypertension Maternal Grandmother   . Stroke Maternal Grandmother   . Hyperlipidemia Maternal Grandfather   . Hypertension Maternal Grandfather   . Skin cancer Maternal Grandfather   . Diabetes Maternal Grandfather   . Seizures Paternal Grandmother 37       deceased  . Coronary artery disease Paternal Grandfather 40       MI  . Breast cancer Neg Hx   . Colon cancer Neg Hx   . Colon polyps Neg Hx   . Esophageal cancer Neg Hx   . Rectal cancer Neg Hx   . Stomach cancer Neg Hx    Allergies  Allergen Reactions  . Metformin And Related Diarrhea    Diarrhea    ? Current Outpatient Medications  Medication Sig Dispense Refill  . Accu-Chek FastClix Lancets MISC 1 each by Does not apply route as directed. Use as directed to check blood sugar twice daily as needed. E11.8 102 each 3  . acetaminophen (TYLENOL) 500 MG tablet Take 1,000 mg by mouth 2 (two) times daily as needed for moderate pain or headache.     Marland Kitchen amLODipine (NORVASC) 5 MG tablet TAKE 1 TABLET DAILY 90 tablet 3  . BIKTARVY 50-200-25 MG TABS tablet Take 1 tablet by mouth at bedtime. 30 tablet 5  .  citalopram (CELEXA) 20 MG tablet TAKE 1 TABLET BY MOUTH EVERY DAY 90 tablet 3  . colestipol (COLESTID) 1 g tablet TAKE 2 TABLETS (2 G TOTAL) BY MOUTH 2 (TWO) TIMES DAILY. 360 tablet 0  . Cyanocobalamin (B-12) 1000 MCG SUBL Place 1 tablet under the tongue daily.    . cyclobenzaprine (FLEXERIL) 5 MG tablet Take 1 tablet (5 mg total) by mouth 2 (two) times daily as needed (headache (sedation precautions)). 30 tablet 3  . glucose blood (ACCU-CHEK GUIDE) test strip 1 each by Other route as needed for other. Use as instructed to check blood sugar 100 each 0  . hydrochlorothiazide (HYDRODIURIL) 25 MG tablet TAKE 1 TABLET DAILY 90 tablet 3  . lisinopril (ZESTRIL) 10 MG tablet Take 1 tablet (10  mg total) by mouth daily. 30 tablet 6  . megestrol (MEGACE) 40 MG tablet TAKE 1 TABLET BY MOUTH TWICE A DAY 180 tablet 1  . Multiple Vitamin (MULTIVITAMIN WITH MINERALS) TABS tablet Take 1 tablet by mouth at bedtime.    Marland Kitchen OZEMPIC, 0.25 OR 0.5 MG/DOSE, 2 MG/1.5ML SOPN Inject 0.5 mg into the skin once a week. 4.5 mL 3  . pantoprazole (PROTONIX) 40 MG tablet TAKE 1 TABLET BY MOUTH TWICE A DAY 180 tablet 0  . Peppermint Oil (IBGARD PO) Take 2 tablets by mouth daily.    Marland Kitchen topiramate (TOPAMAX) 25 MG tablet Take 1 tablet (25 mg total) by mouth at bedtime. 30 tablet 6   No current facility-administered medications for this visit.    REVIEW OF SYSTEMS:  Const: negative fever, negative chills, 10 pound weight loss Eyes: negative diplopia or visual changes, negative eye pain ENT: negative coryza, negative sore throat Resp: negative cough, hemoptysis, dyspnea Cards: negative for chest pain, palpitations, lower extremity edema GU: negative for frequency, dysuria and hematuria Skin: negative for rash and pruritus Heme: negative for easy bruising and gum/nose bleeding MS: negative for myalgias, arthralgias, back pain and muscle weakness Neurolo:headaches much better dizziness, NO vertigo, memory problems  Psych:stress   Objective:  VITALS:  BP 138/78   Pulse 100   Temp 97.9 F (36.6 C) (Oral)   Resp 16   Ht 5\' 9"  (1.753 m)   Wt (!) 343 lb (155.6 kg)   SpO2 97%   BMI 50.65 kg/m    PHYSICAL EXAM:  General:well Lungs: Clear to auscultation bilaterally. No Wheezing or Rhonchi. No rales. Heart: Regular rate and rhythm, no murmur, rub or gallop. Abdomen: Soft, non-tender,not distended. Bowel sounds normal. No masses Extremities: Extremities normal, atraumatic, no cyanosis. No edema. No clubbing Skin: No rashes or lesions. Not Jaundiced Lymph: Cervical, supraclavicular normal. Neurologic: Grossly non-focal Pertinent Labs Health maintenance:  Vaccination  Vaccine Date last given comment   Influenza 05/18/19   Hepatitis B 3/10, 5/19 and 02/16/19   Hepatitis A 08/16/18 and 02/16/19   Prevnar-PCV-13    Pneumovac-PPSV-23 01/04/15   TdaP 12/14/12   HPV    Shingrix ( zoster vaccine)     ______________________  Labs Lab Result  Date comment  HIV VL  <20    CD4 1263 05/18/19   Genotype     [id number] Neg 08/16/2018   HIV antibody     RPR  nonreactive 08/16/2018   Quantiferon Gold  negative 08/16/2018   Hep C ab Neg 03/30/18   Hepatitis B-ab,ag,c Sag-Neg, Sab neg Cab neg 03/30/18   Hepatitis A-IgM, IgG /T Neg 03/30/18   Lipid TC 170, HDL 41, LDL 108 TGL 99 11/26/17   GC/CHL  PAP            Preventive  Procedure Result  Date comment  colonoscopy Tubular adenoma 07/03/18   Mammogram Normal 11/26/17   Dental exam     Opthal       Impression/Recommendation 44 y.o. female with a history of HIV, hypertension               ? ?HIV under good control.  On Biktarvy.  100% adherent.  Viral load less than 20 and CD4 is 1263  10 pound weight loss since starting ozempic ( semaglutide  HTN better controlled- now in triple therapy- amlodipine+ HCTZ= ACEI  DM started on ozempic   B12 deficiency on supplement ( she has been on long term PPI which can cause it)  Diabetes mellitus started on metformin  Health maintenance updated-  Partner notification done     [place] virus vaccine  Discussed, made appt for June 11th  ?Discussed the management with patient - follow up 6 months

## 2019-11-17 ENCOUNTER — Ambulatory Visit: Payer: BC Managed Care – PPO

## 2019-11-17 LAB — T-HELPER CELLS CD4/CD8 %
% CD 4 Pos. Lymph.: 55.1 % (ref 30.8–58.5)
Absolute CD 4 Helper: 1653 /uL — ABNORMAL HIGH (ref 359–1519)
Basophils Absolute: 0.1 10*3/uL (ref 0.0–0.2)
Basos: 1 %
CD3+CD4+ Cells/CD3+CD8+ Cells Bld: 2.46 (ref 0.92–3.72)
CD3+CD8+ Cells # Bld: 672 /uL (ref 109–897)
CD3+CD8+ Cells NFr Bld: 22.4 % (ref 12.0–35.5)
EOS (ABSOLUTE): 0.3 10*3/uL (ref 0.0–0.4)
Eos: 4 %
Hematocrit: 40.9 % (ref 34.0–46.6)
Hemoglobin: 13.9 g/dL (ref 11.1–15.9)
Immature Grans (Abs): 0 10*3/uL (ref 0.0–0.1)
Immature Granulocytes: 0 %
Lymphocytes Absolute: 3 10*3/uL (ref 0.7–3.1)
Lymphs: 40 %
MCH: 31.9 pg (ref 26.6–33.0)
MCHC: 34 g/dL (ref 31.5–35.7)
MCV: 94 fL (ref 79–97)
Monocytes Absolute: 0.4 10*3/uL (ref 0.1–0.9)
Monocytes: 6 %
Neutrophils Absolute: 3.8 10*3/uL (ref 1.4–7.0)
Neutrophils: 49 %
Platelets: 316 10*3/uL (ref 150–450)
RBC: 4.36 x10E6/uL (ref 3.77–5.28)
RDW: 11.7 % (ref 11.7–15.4)
WBC: 7.6 10*3/uL (ref 3.4–10.8)

## 2019-11-17 LAB — HIV-1 RNA QUANT-NO REFLEX-BLD
HIV 1 RNA Quant: <20 copies/mL
LOG10 HIV-1 RNA: UNDETERMINED log10copy/mL

## 2019-11-17 LAB — RPR: RPR Ser Ql: NONREACTIVE

## 2019-11-18 LAB — QUANTIFERON-TB GOLD PLUS: QuantiFERON-TB Gold Plus: NEGATIVE

## 2019-11-18 LAB — QUANTIFERON-TB GOLD PLUS (RQFGPL)
QuantiFERON Mitogen Value: >10 IU/mL
QuantiFERON Nil Value: 0.02 IU/mL
QuantiFERON TB1 Ag Value: 0.07 IU/mL
QuantiFERON TB2 Ag Value: 0.05 IU/mL

## 2019-12-25 ENCOUNTER — Other Ambulatory Visit: Payer: Self-pay | Admitting: Family Medicine

## 2019-12-26 ENCOUNTER — Other Ambulatory Visit: Payer: Self-pay | Admitting: Family Medicine

## 2019-12-26 DIAGNOSIS — IMO0002 Reserved for concepts with insufficient information to code with codable children: Secondary | ICD-10-CM

## 2019-12-26 DIAGNOSIS — E538 Deficiency of other specified B group vitamins: Secondary | ICD-10-CM

## 2019-12-26 NOTE — Telephone Encounter (Signed)
Pharmacy requesting 90-day rx.  Topamax Last filled:  09/29/19, #30 Last OV:  3-4 mo DM f/u Next OV:  01/03/20, CPE

## 2019-12-27 ENCOUNTER — Other Ambulatory Visit: Payer: Self-pay

## 2019-12-27 ENCOUNTER — Other Ambulatory Visit (INDEPENDENT_AMBULATORY_CARE_PROVIDER_SITE_OTHER): Payer: BC Managed Care – PPO

## 2019-12-27 DIAGNOSIS — E538 Deficiency of other specified B group vitamins: Secondary | ICD-10-CM | POA: Diagnosis not present

## 2019-12-27 DIAGNOSIS — E1165 Type 2 diabetes mellitus with hyperglycemia: Secondary | ICD-10-CM | POA: Diagnosis not present

## 2019-12-27 DIAGNOSIS — E118 Type 2 diabetes mellitus with unspecified complications: Secondary | ICD-10-CM | POA: Diagnosis not present

## 2019-12-27 DIAGNOSIS — IMO0002 Reserved for concepts with insufficient information to code with codable children: Secondary | ICD-10-CM

## 2019-12-27 LAB — MICROALBUMIN / CREATININE URINE RATIO
Creatinine,U: 234.3 mg/dL
Microalb Creat Ratio: 0.6 mg/g (ref 0.0–30.0)
Microalb, Ur: 1.3 mg/dL (ref 0.0–1.9)

## 2019-12-27 LAB — LIPID PANEL
Cholesterol: 175 mg/dL (ref 0–200)
HDL: 34.7 mg/dL — ABNORMAL LOW (ref >39.00)
LDL Cholesterol: 115 mg/dL — ABNORMAL HIGH (ref 0–99)
NonHDL: 140.02
Total CHOL/HDL Ratio: 5
Triglycerides: 125 mg/dL (ref 0.0–149.0)
VLDL: 25 mg/dL (ref 0.0–40.0)

## 2019-12-27 LAB — COMPREHENSIVE METABOLIC PANEL
ALT: 55 U/L — ABNORMAL HIGH (ref 0–35)
AST: 49 U/L — ABNORMAL HIGH (ref 0–37)
Albumin: 3.8 g/dL (ref 3.5–5.2)
Alkaline Phosphatase: 73 U/L (ref 39–117)
BUN: 8 mg/dL (ref 6–23)
CO2: 29 mEq/L (ref 19–32)
Calcium: 9.4 mg/dL (ref 8.4–10.5)
Chloride: 104 mEq/L (ref 96–112)
Creatinine, Ser: 1.08 mg/dL (ref 0.40–1.20)
GFR: 55.51 mL/min — ABNORMAL LOW (ref >60.00)
Glucose, Bld: 190 mg/dL — ABNORMAL HIGH (ref 70–99)
Potassium: 3.9 mEq/L (ref 3.5–5.1)
Sodium: 139 mEq/L (ref 135–145)
Total Bilirubin: 0.6 mg/dL (ref 0.2–1.2)
Total Protein: 6.8 g/dL (ref 6.0–8.3)

## 2019-12-27 LAB — HEMOGLOBIN A1C: Hgb A1c MFr Bld: 8.1 % — ABNORMAL HIGH (ref 4.6–6.5)

## 2019-12-27 LAB — VITAMIN B12: Vitamin B-12: 172 pg/mL — ABNORMAL LOW (ref 211–911)

## 2019-12-31 ENCOUNTER — Other Ambulatory Visit: Payer: Self-pay | Admitting: Family Medicine

## 2020-01-01 NOTE — Telephone Encounter (Signed)
Rx was last refilled 12/21/18 for #90 with 3 refills.  Patient was last seen 10/03/19. Ok to refill?

## 2020-01-03 ENCOUNTER — Encounter: Payer: BC Managed Care – PPO | Admitting: Family Medicine

## 2020-01-06 IMAGING — CT CT ABDOMEN W/ CM
2 of 4 series · 16 of 46 positions shown, 18 images · IV contrast (APPLIED)
Comparison: None.

CLINICAL DATA: Acute abdominal pain.

EXAM:
CT ABDOMEN WITH CONTRAST
TECHNIQUE: Multidetector CT imaging of the abdomen was performed using the
standard protocol following bolus administration of intravenous
contrast.
CONTRAST:  125mL 20TOLB-KOO IOPAMIDOL (20TOLB-KOO) INJECTION 61%

[Series 2: axial st · axial · 0.84mm/px · z∈[-439,-159]mm · 13 of 64 slices shown, 15 images]
[im 4/64  soft-tissue]
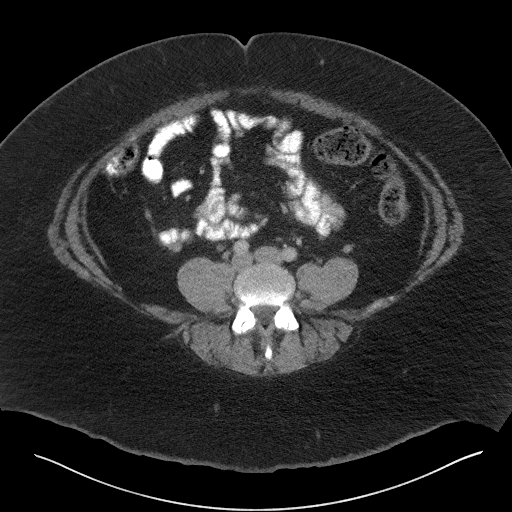
[im 4/64  bone]
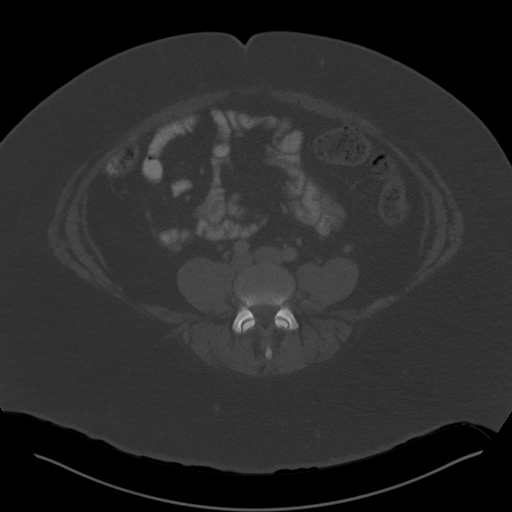
[im 10/64  soft-tissue]
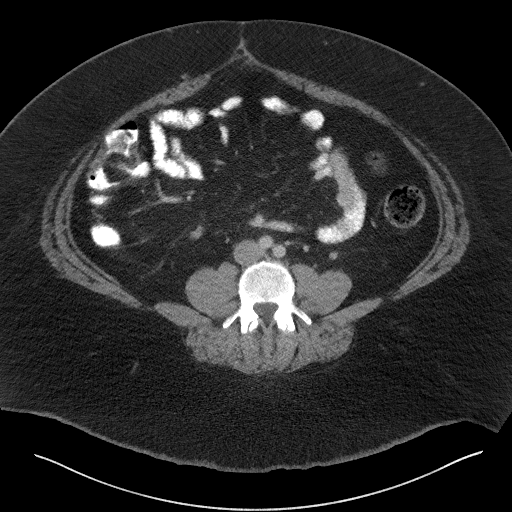
[im 13/64  soft-tissue]
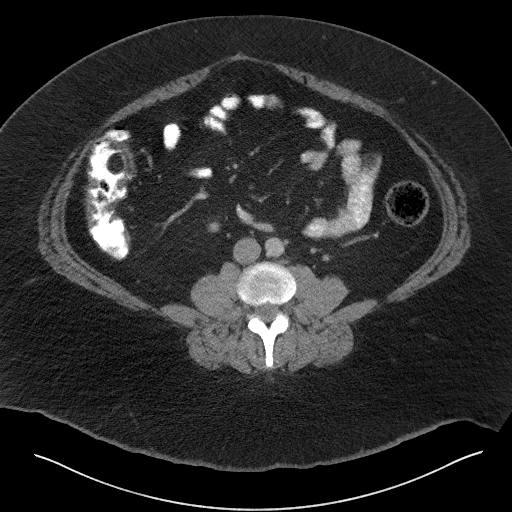
[im 19/64  soft-tissue]
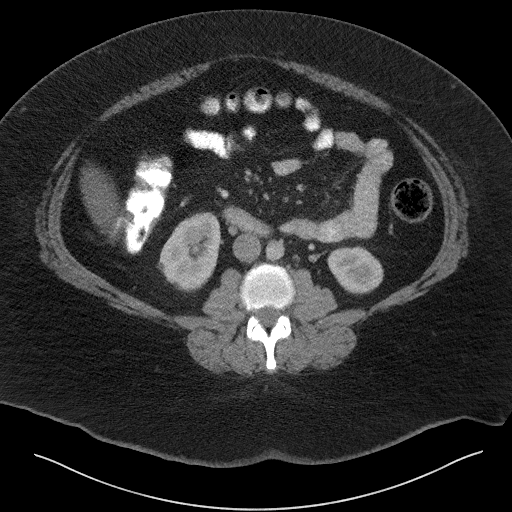
[im 23/64  soft-tissue]
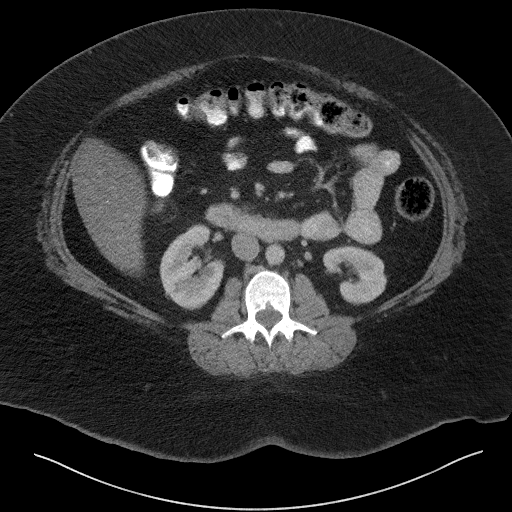
[im 29/64  soft-tissue]
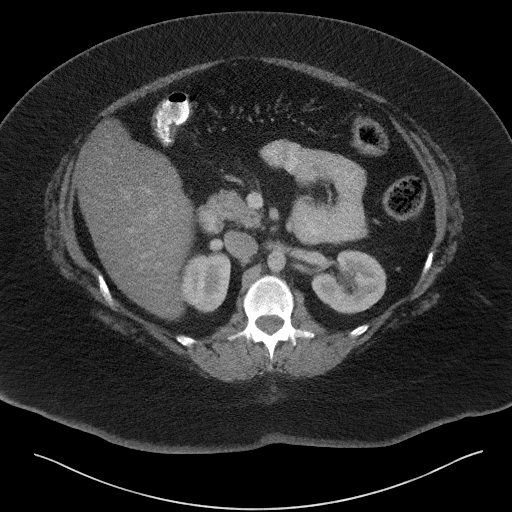
[im 32/64  soft-tissue]
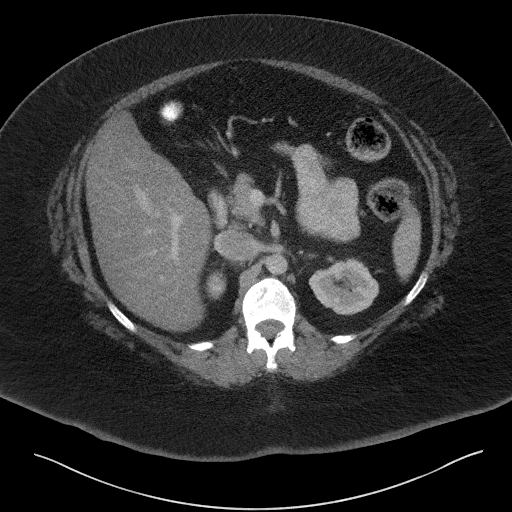
[im 35/64  soft-tissue]
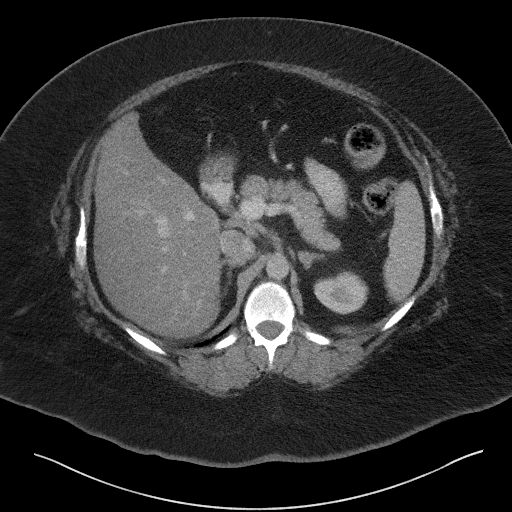
[im 41/64  soft-tissue]
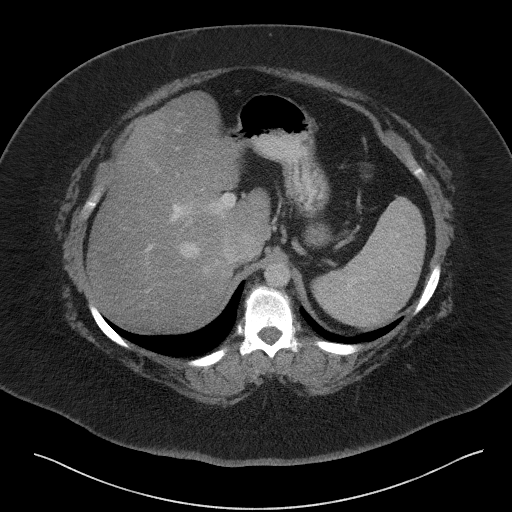
[im 41/64  bone]
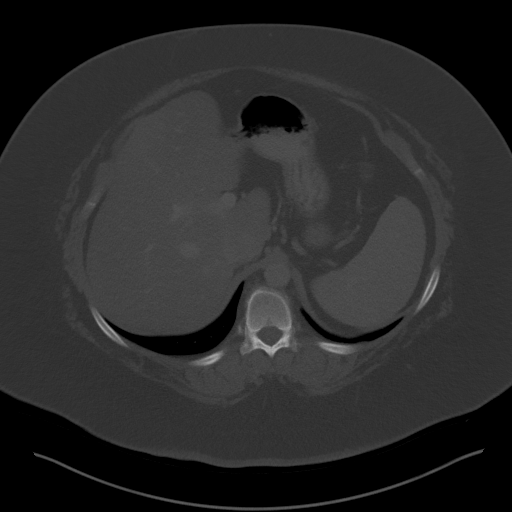
[im 45/64  soft-tissue]
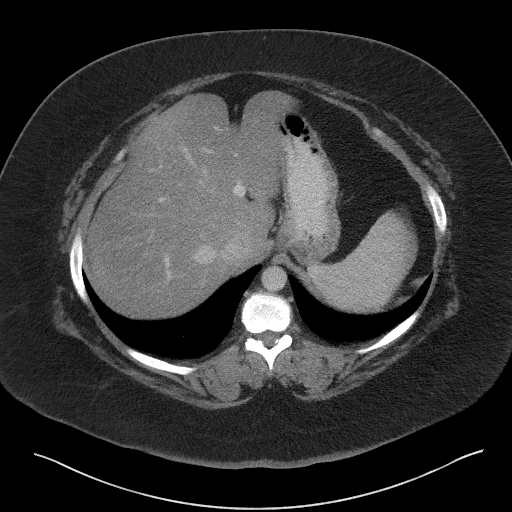
[im 51/64  soft-tissue]
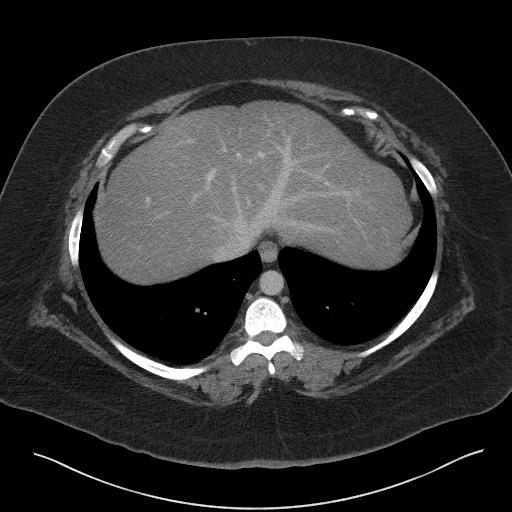
[im 54/64  soft-tissue]
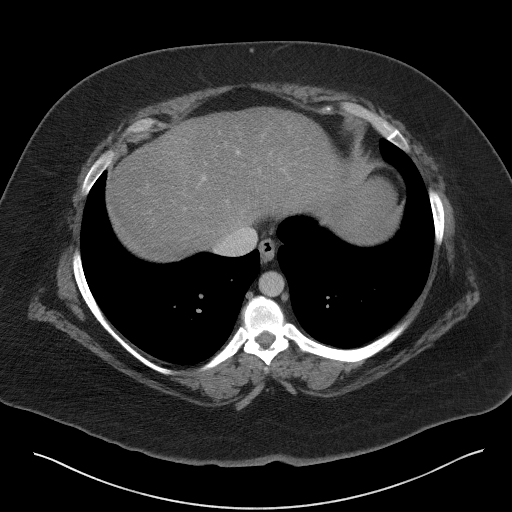
[im 60/64  soft-tissue]
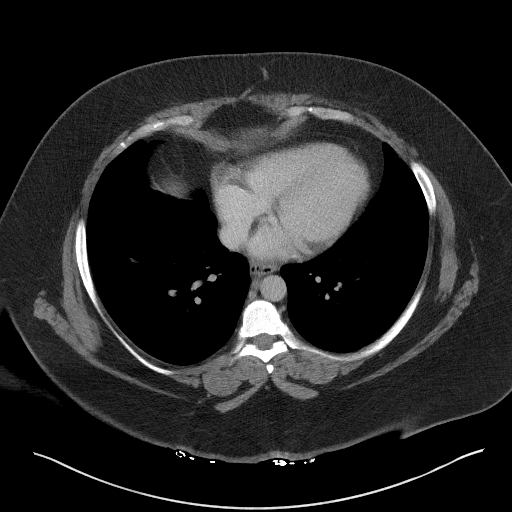

[Series 5: coronal st · coronal · 0.63mm/px · 3 of 120 slices shown]
[im 40/120  soft-tissue]
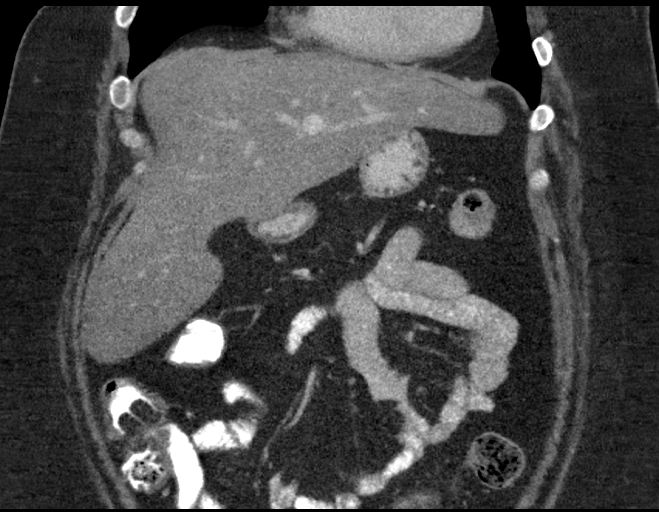
[im 53/120  soft-tissue]
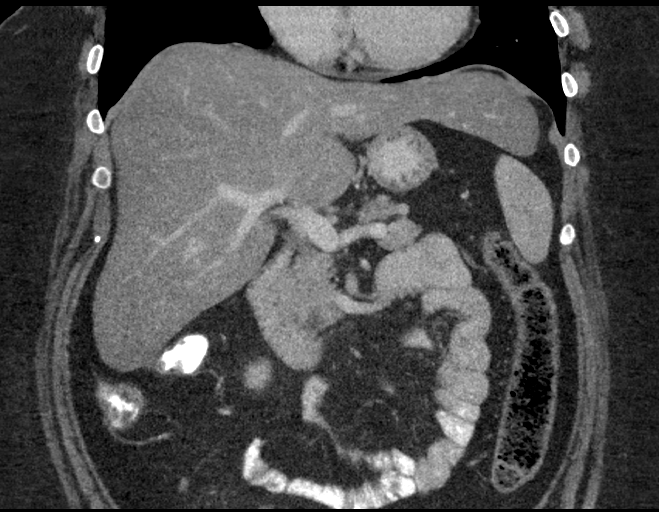
[im 67/120  soft-tissue]
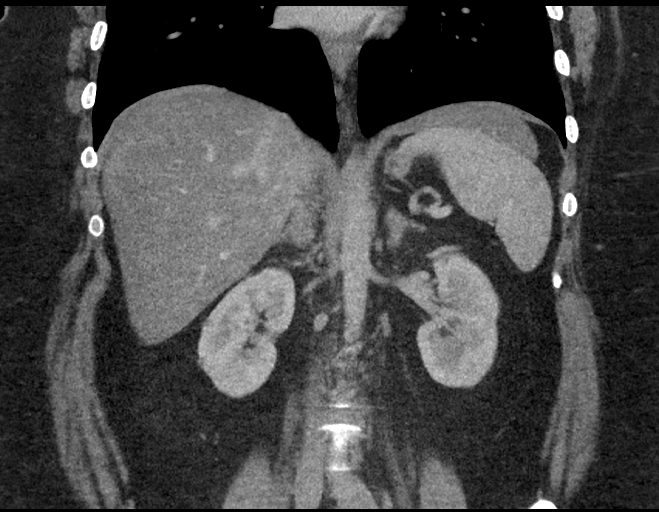

[16 of 46 positions shown; findings below may reference images not displayed]

FINDINGS: Lower chest: No acute abnormality.

Hepatobiliary: There is diffuse hepatic steatosis. There is
enlargement of both caudate lobe and lateral segment of left lobe of
liver. No focal liver abnormality identified. Previous
cholecystectomy. No biliary dilatation.

Pancreas: Unremarkable. No pancreatic ductal dilatation or
surrounding inflammatory changes.

Spleen: Normal in size without focal abnormality.

Adrenals/Urinary Tract: Normal appearance of the adrenal glands.
Small low-density structure within the right lobe of liver measures
8 mm and is too small to characterize. Tiny stone identified within
the inferior pole of the left kidney measuring 3 mm. No mass or
hydronephrosis identified.

Stomach/Bowel: Stomach appears normal. The visualized bowel loops
are unremarkable. The appendix is visualized and appears normal.

Vascular/Lymphatic: Normal appearance of the abdominal aorta. No
adenopathy.

Other: No abdominal wall hernia or abnormality. No abdominopelvic
ascites.

Musculoskeletal: No acute or significant osseous findings.
IMPRESSION: 1. No acute findings.
2. Hepatic steatosis. There also morphologic features of liver which
may be seen with early cirrhosis. If there is a clinical concern for
cirrhosis consider further evaluation and risk stratification with
sonographic hepatic elastography.
3. Small nonobstructing left renal calculus.

## 2020-01-16 ENCOUNTER — Encounter: Payer: Self-pay | Admitting: Family Medicine

## 2020-01-16 ENCOUNTER — Other Ambulatory Visit: Payer: Self-pay

## 2020-01-16 ENCOUNTER — Ambulatory Visit (INDEPENDENT_AMBULATORY_CARE_PROVIDER_SITE_OTHER): Payer: BC Managed Care – PPO | Admitting: Family Medicine

## 2020-01-16 VITALS — BP 138/72 | HR 95 | Temp 97.6°F | Ht 68.0 in | Wt 327.0 lb

## 2020-01-16 DIAGNOSIS — IMO0002 Reserved for concepts with insufficient information to code with codable children: Secondary | ICD-10-CM

## 2020-01-16 DIAGNOSIS — E118 Type 2 diabetes mellitus with unspecified complications: Secondary | ICD-10-CM | POA: Diagnosis not present

## 2020-01-16 DIAGNOSIS — Z0000 Encounter for general adult medical examination without abnormal findings: Secondary | ICD-10-CM | POA: Diagnosis not present

## 2020-01-16 DIAGNOSIS — E538 Deficiency of other specified B group vitamins: Secondary | ICD-10-CM | POA: Diagnosis not present

## 2020-01-16 DIAGNOSIS — E1165 Type 2 diabetes mellitus with hyperglycemia: Secondary | ICD-10-CM

## 2020-01-16 DIAGNOSIS — K529 Noninfective gastroenteritis and colitis, unspecified: Secondary | ICD-10-CM

## 2020-01-16 DIAGNOSIS — Z6841 Body Mass Index (BMI) 40.0 and over, adult: Secondary | ICD-10-CM

## 2020-01-16 DIAGNOSIS — K219 Gastro-esophageal reflux disease without esophagitis: Secondary | ICD-10-CM

## 2020-01-16 DIAGNOSIS — N920 Excessive and frequent menstruation with regular cycle: Secondary | ICD-10-CM

## 2020-01-16 DIAGNOSIS — Z21 Asymptomatic human immunodeficiency virus [HIV] infection status: Secondary | ICD-10-CM

## 2020-01-16 DIAGNOSIS — F331 Major depressive disorder, recurrent, moderate: Secondary | ICD-10-CM | POA: Diagnosis not present

## 2020-01-16 DIAGNOSIS — I10 Essential (primary) hypertension: Secondary | ICD-10-CM

## 2020-01-16 MED ORDER — OZEMPIC (1 MG/DOSE) 4 MG/3ML ~~LOC~~ SOPN: 1.0000 mg | PEN_INJECTOR | SUBCUTANEOUS | 3 refills | Status: DC

## 2020-01-16 MED ORDER — CYANOCOBALAMIN 1000 MCG/ML IJ SOLN
1000.0000 ug | Freq: Once | INTRAMUSCULAR | Status: AC
  Administered 2020-01-16: 1000 ug via INTRAMUSCULAR

## 2020-01-16 NOTE — Assessment & Plan Note (Addendum)
Stable period on current regimen. Continue.

## 2020-01-16 NOTE — Assessment & Plan Note (Addendum)
Chronic, stable on current regimen of lisinopril amlodipine and hctz.

## 2020-01-16 NOTE — Progress Notes (Signed)
This visit was conducted in person.  BP 138/72   Pulse 95   Temp 97.6 F (36.4 C)   Ht 5\' 8"  (1.727 m)   Wt (!) 327 lb (148.3 kg)   SpO2 97%   BMI 49.72 kg/m    CC: CPE Subjective:    Patient ID: Cassandra [name], female    DOB: 16-Oct-1975, 44 y.o.   MRN: [id number]  HPI: Cassandra [name] is a 44 y.o. female presenting on 01/16/2020 for Annual Exam (Pt have appt for Pap in Sept with Kennon Rounds.)   HIV - sees Dr Delaine Lame ID twice yearly, on biktarvy.  15 lb weight loss since last seen! Watch diet more closely, increasing exercise (walking dog regularly)   Preventative: COLONOSCOPY WITH PROPOFOL 07/04/2018 for chronic diarrhea after cholecystectomy started on colestipol with benefit ([place]). Thought she has IBS-D. 2 TA, rpt 5 yrs Well woman with Dr. Kennon Rounds -11/2017. Normal pap smears. upcoming appt 02/2020. S/p uterine ablation for prolonged bleed - ongoing irregular bleeding, continues megace (ran out 12/11/2019). LMP - 01/01/2020, again 01/15/2020  Mammogram 12/2018 [facility]  Flu yearly  Tdap 12/2012  Pneumovax 2016  COVID vaccine - discussed - considering  Seat belt use discussed  Sunscreen use discussed, no changing moles on skin. Ex smoker - 2017, intermittent smoking  Alcohol - rare Dentist - overdue Eye exam - yearly  Lives with husband and 2 children, no pets Occupation: call center rep Edu: some college Activity: walks around building.  Diet: 1 mt dew in am, rest water, fruits/vegetables daily, bringing food from home     Relevant past medical, surgical, family and social history reviewed and updated as indicated. Interim medical history since our last visit reviewed. Allergies and medications reviewed and updated. Outpatient Medications Prior to Visit  Medication Sig Dispense Refill  . Accu-Chek FastClix Lancets MISC 1 each by Does not apply route as directed. Use as directed to check blood sugar twice daily as needed. E11.8 102 each 3  . acetaminophen  (TYLENOL) 500 MG tablet Take 1,000 mg by mouth 2 (two) times daily as needed for moderate pain or headache.     Marland Kitchen amLODipine (NORVASC) 5 MG tablet TAKE 1 TABLET DAILY 90 tablet 3  . BIKTARVY 50-200-25 MG TABS tablet Take 1 tablet by mouth at bedtime. 30 tablet 5  . citalopram (CELEXA) 20 MG tablet TAKE 1 TABLET BY MOUTH EVERY DAY 90 tablet 3  . colestipol (COLESTID) 1 g tablet TAKE 2 TABLETS (2 G TOTAL) BY MOUTH 2 (TWO) TIMES DAILY. 360 tablet 0  . Cyanocobalamin (B-12) 1000 MCG SUBL Place 1 tablet under the tongue daily.    . cyclobenzaprine (FLEXERIL) 5 MG tablet Take 1 tablet (5 mg total) by mouth 2 (two) times daily as needed (headache (sedation precautions)). 30 tablet 3  . glucose blood (ACCU-CHEK GUIDE) test strip 1 each by Other route as needed for other. Use as instructed to check blood sugar 100 each 0  . hydrochlorothiazide (HYDRODIURIL) 25 MG tablet TAKE 1 TABLET DAILY 90 tablet 3  . lisinopril (ZESTRIL) 10 MG tablet Take 1 tablet (10 mg total) by mouth daily. 30 tablet 6  . megestrol (MEGACE) 40 MG tablet TAKE 1 TABLET BY MOUTH TWICE A DAY 180 tablet 1  . Multiple Vitamin (MULTIVITAMIN WITH MINERALS) TABS tablet Take 1 tablet by mouth at bedtime.    . pantoprazole (PROTONIX) 40 MG tablet TAKE 1 TABLET BY MOUTH TWICE A DAY 180 tablet 0  . Peppermint Oil (IBGARD  PO) Take 2 tablets by mouth daily.    Marland Kitchen topiramate (TOPAMAX) 25 MG tablet TAKE 1 TABLET BY MOUTH EVERYDAY AT BEDTIME 90 tablet 2  . OZEMPIC, 0.25 OR 0.5 MG/DOSE, 2 MG/1.5ML SOPN Inject 0.5 mg into the skin once a week. 4.5 mL 3   No facility-administered medications prior to visit.     Per HPI unless specifically indicated in ROS section below Review of Systems  Constitutional: Negative for activity change, appetite change, chills, fatigue, fever and unexpected weight change.  HENT: Negative for hearing loss.   Eyes: Negative for visual disturbance.  Respiratory: Positive for cough. Negative for chest tightness, shortness  of breath and wheezing.   Cardiovascular: Negative for chest pain, palpitations and leg swelling.  Gastrointestinal: Positive for abdominal pain. Negative for abdominal distention, blood in stool, constipation, diarrhea, nausea and vomiting.  Genitourinary: Negative for difficulty urinating and hematuria.  Musculoskeletal: Negative for arthralgias, myalgias and neck pain.  Skin: Negative for rash.  Neurological: Negative for dizziness, seizures, syncope and headaches.  Hematological: Negative for adenopathy. Does not bruise/bleed easily.  Psychiatric/Behavioral: Negative for dysphoric mood. The patient is not nervous/anxious.    Objective:  BP 138/72   Pulse 95   Temp 97.6 F (36.4 C)   Ht 5\' 8"  (1.727 m)   Wt (!) 327 lb (148.3 kg)   SpO2 97%   BMI 49.72 kg/m   Wt Readings from Last 3 Encounters:  01/16/20 (!) 327 lb (148.3 kg)  11/16/19 (!) 343 lb (155.6 kg)  10/03/19 (!) 343 lb (155.6 kg)      Physical Exam Vitals and nursing note reviewed.  Constitutional:      General: She is not in acute distress.    Appearance: Normal appearance. She is well-developed. She is obese. She is not ill-appearing.  HENT:     Head: Normocephalic and atraumatic.     Right Ear: Hearing, tympanic membrane, ear canal and external ear normal.     Left Ear: Hearing, tympanic membrane, ear canal and external ear normal.     Mouth/Throat:     Pharynx: Uvula midline.  Eyes:     General: No scleral icterus.    Extraocular Movements: Extraocular movements intact.     Conjunctiva/sclera: Conjunctivae normal.     Pupils: Pupils are equal, round, and reactive to light.  Neck:     Thyroid: No thyroid mass, thyromegaly or thyroid tenderness.  Cardiovascular:     Rate and Rhythm: Normal rate and regular rhythm.     Pulses: Normal pulses.          Radial pulses are 2+ on the right side and 2+ on the left side.     Heart sounds: Normal heart sounds. No murmur heard.   Pulmonary:     Effort: Pulmonary  effort is normal. No respiratory distress.     Breath sounds: Normal breath sounds. No wheezing, rhonchi or rales.  Abdominal:     General: Abdomen is flat. Bowel sounds are normal. There is no distension.     Palpations: Abdomen is soft. There is no mass.     Tenderness: There is no abdominal tenderness. There is no guarding or rebound.     Hernia: No hernia is present.  Musculoskeletal:        General: Normal range of motion.     Cervical back: Normal range of motion and neck supple.     Right lower leg: No edema.     Left lower leg: No edema.  Lymphadenopathy:     Cervical: No cervical adenopathy.  Skin:    General: Skin is warm and dry.     Findings: No rash.  Neurological:     General: No focal deficit present.     Mental Status: She is alert and oriented to person, place, and time.     Comments: CN grossly intact, station and gait intact  Psychiatric:        Mood and Affect: Mood normal.        Behavior: Behavior normal.        Thought Content: Thought content normal.        Judgment: Judgment normal.       Results for orders placed or performed in visit on 12/27/19  Microalbumin / creatinine urine ratio  Result Value Ref Range   Microalb, Ur 1.3 0.0 - 1.9 mg/dL   Creatinine,U 234.3 mg/dL   Microalb Creat Ratio 0.6 0.0 - 30.0 mg/g  Vitamin B12  Result Value Ref Range   Vitamin B-12 172 (L) 211 - 911 pg/mL  Hemoglobin A1c  Result Value Ref Range   Hgb A1c MFr Bld 8.1 (H) 4.6 - 6.5 %  Comprehensive metabolic panel  Result Value Ref Range   Sodium 139 135 - 145 mEq/L   Potassium 3.9 3.5 - 5.1 mEq/L   Chloride 104 96 - 112 mEq/L   CO2 29 19 - 32 mEq/L   Glucose, Bld 190 (H) 70 - 99 mg/dL   BUN 8 6 - 23 mg/dL   Creatinine, Ser 1.08 0.40 - 1.20 mg/dL   Total Bilirubin 0.6 0.2 - 1.2 mg/dL   Alkaline Phosphatase 73 39 - 117 U/L   AST 49 (H) 0 - 37 U/L   ALT 55 (H) 0 - 35 U/L   Total Protein 6.8 6.0 - 8.3 g/dL   Albumin 3.8 3.5 - 5.2 g/dL   GFR 55.51 (L) >60.00  mL/min   Calcium 9.4 8.4 - 10.5 mg/dL  Lipid panel  Result Value Ref Range   Cholesterol 175 0 - 200 mg/dL   Triglycerides 125.0 0 - 149 mg/dL   HDL 34.70 (L) >39.00 mg/dL   VLDL 25.0 0.0 - 40.0 mg/dL   LDL Cholesterol 115 (H) 0 - 99 mg/dL   Total CHOL/HDL Ratio 5    NonHDL 140.02    Assessment & Plan:  This visit occurred during the SARS-CoV-2 public health emergency.  Safety protocols were in place, including screening questions prior to the visit, additional usage of staff PPE, and extensive cleaning of exam room while observing appropriate contact time as indicated for disinfecting solutions.   Problem List Items Addressed This Visit    Vitamin B12 deficiency    Level remains low despite oral replacement (not as regular this past month).  b12 shot today then monthly for 6 months, then reassess This likely contributes to ongoing fatigue.  Check IF next labwork.       Uncontrolled diabetes mellitus with complication, without long-term current use of insulin (HCC)    Chronic, deteriorated despite regularly taking ozempic 0.5mg  weekly and noted weight loss. H/o metformin intolerance. Has tolerated ozempic 0.5mg  well - will trial 1mg  weekly. RTC 3 mo DM f/u visit.       Relevant Medications   Semaglutide, 1 MG/DOSE, (OZEMPIC, 1 MG/DOSE,) 4 MG/3ML SOPN   Morbid obesity with BMI of 45.0-49.9, adult (HCC)    Congratulated on weight loss to date! Pt motivated to continue healthy diet and lifestyle choices.  Relevant Medications   Semaglutide, 1 MG/DOSE, (OZEMPIC, 1 MG/DOSE,) 4 MG/3ML SOPN   MDD (major depressive disorder), recurrent episode, moderate (HCC)    Stable period on current regimen. Continue.       Hypertension    Chronic, stable on current regimen of lisinopril amlodipine and hctz.       HIV (human immunodeficiency virus infection) ([place])    Appreciate ID care.       Heavy menstrual bleeding    Planning to f/u with GYN next month. Was previously on megace,  ran out last month.       Health maintenance examination - Primary    Preventative protocols reviewed and updated unless pt declined. Discussed healthy diet and lifestyle.       GERD (gastroesophageal reflux disease)    Continues PPI BID.       Chronic diarrhea    Has seen GI. Presumed IBS-D.  Stable period on citrucel, IBcard and colestipol - and off metformin.           Meds ordered this encounter  Medications  . Semaglutide, 1 MG/DOSE, (OZEMPIC, 1 MG/DOSE,) 4 MG/3ML SOPN    Sig: Inject 0.75 mLs (1 mg total) into the skin once a week.    Dispense:  9 mL    Refill:  3    Note new dose  . cyanocobalamin ((VITAMIN B-12)) injection 1,000 mcg   No orders of the defined types were placed in this encounter.   Patient instructions: Call to schedule mammogram  I do recommend the COVID vaccine.  B12 shot today, then schedule monthly B12 shots for 6 months and we will reassess levels at that time. Increase ozempic to 1mg  weekly.  Schedule 3 month diabetes follow up visit.   Follow up plan: Return in about 3 months (around 04/17/2020) for follow up visit.  Ria Bush, MD

## 2020-01-16 NOTE — Assessment & Plan Note (Addendum)
Has seen GI. Presumed IBS-D.  Stable period on citrucel, IBcard and colestipol - and off metformin.

## 2020-01-16 NOTE — Assessment & Plan Note (Signed)
Continues PPI BID.  

## 2020-01-16 NOTE — Assessment & Plan Note (Signed)
Planning to f/u with GYN next month. Was previously on megace, ran out last month.

## 2020-01-16 NOTE — Assessment & Plan Note (Addendum)
Level remains low despite oral replacement (not as regular this past month).  b12 shot today then monthly for 6 months, then reassess This likely contributes to ongoing fatigue.  Check IF next labwork.

## 2020-01-16 NOTE — Assessment & Plan Note (Signed)
Preventative protocols reviewed and updated unless pt declined. Discussed healthy diet and lifestyle.  

## 2020-01-16 NOTE — Assessment & Plan Note (Signed)
Congratulated on weight loss to date! Pt motivated to continue healthy diet and lifestyle choices.

## 2020-01-16 NOTE — Assessment & Plan Note (Signed)
Appreciate ID care.  

## 2020-01-16 NOTE — Assessment & Plan Note (Addendum)
Chronic, deteriorated despite regularly taking ozempic 0.5mg  weekly and noted weight loss. H/o metformin intolerance. Has tolerated ozempic 0.5mg  well - will trial 1mg  weekly. RTC 3 mo DM f/u visit.

## 2020-01-16 NOTE — Patient Instructions (Addendum)
Call to schedule mammogram  I do recommend the COVID vaccine.  B12 shot today, then schedule monthly B12 shots for 6 months and we will reassess levels at that time. Increase ozempic to 1mg  weekly.  Schedule 3 month diabetes follow up visit.   Health Maintenance, Female Adopting a healthy lifestyle and getting preventive care are important in promoting health and wellness. Ask your health care provider about:  The right schedule for you to have regular tests and exams.  Things you can do on your own to prevent diseases and keep yourself healthy. What should I know about diet, weight, and exercise? Eat a healthy diet   Eat a diet that includes plenty of vegetables, fruits, low-fat dairy products, and lean protein.  Do not eat a lot of foods that are high in solid fats, added sugars, or sodium. Maintain a healthy weight Body mass index (BMI) is used to identify weight problems. It estimates body fat based on height and weight. Your health care provider can help determine your BMI and help you achieve or maintain a healthy weight. Get regular exercise Get regular exercise. This is one of the most important things you can do for your health. Most adults should:  Exercise for at least 150 minutes each week. The exercise should increase your heart rate and make you sweat (moderate-intensity exercise).  Do strengthening exercises at least twice a week. This is in addition to the moderate-intensity exercise.  Spend less time sitting. Even light physical activity can be beneficial. Watch cholesterol and blood lipids Have your blood tested for lipids and cholesterol at 42 years of age, then have this test every 5 years. Have your cholesterol levels checked more often if:  Your lipid or cholesterol levels are high.  You are older than 42 years of age.  You are at high risk for heart disease. What should I know about cancer screening? Depending on your health history and family history,  you may need to have cancer screening at various ages. This may include screening for:  Breast cancer.  Cervical cancer.  Colorectal cancer.  Skin cancer.  Lung cancer. What should I know about heart disease, diabetes, and high blood pressure? Blood pressure and heart disease  High blood pressure causes heart disease and increases the risk of stroke. This is more likely to develop in people who have high blood pressure readings, are of African descent, or are overweight.  Have your blood pressure checked: ? Every 3-5 years if you are 59-6 years of age. ? Every year if you are 30 years old or older. Diabetes Have regular diabetes screenings. This checks your fasting blood sugar level. Have the screening done:  Once every three years after age 10 if you are at a normal weight and have a low risk for diabetes.  More often and at a younger age if you are overweight or have a high risk for diabetes. What should I know about preventing infection? Hepatitis B If you have a higher risk for hepatitis B, you should be screened for this virus. Talk with your health care provider to find out if you are at risk for hepatitis B infection. Hepatitis C Testing is recommended for:  Everyone born from [birth year] through 1963.  Anyone with known risk factors for hepatitis C. Sexually transmitted infections (STIs)  Get screened for STIs, including gonorrhea and chlamydia, if: ? You are sexually active and are younger than 42 years of age. ? You are older than 42 years  of age and your health care provider tells you that you are at risk for this type of infection. ? Your sexual activity has changed since you were last screened, and you are at increased risk for chlamydia or gonorrhea. Ask your health care provider if you are at risk.  Ask your health care provider about whether you are at high risk for HIV. Your health care provider may recommend a prescription medicine to help prevent HIV infection.  If you choose to take medicine to prevent HIV, you should first get tested for HIV. You should then be tested every 3 months for as long as you are taking the medicine. Pregnancy  If you are about to stop having your period (premenopausal) and you may become pregnant, seek counseling before you get pregnant.  Take 400 to 800 micrograms (mcg) of folic acid every day if you become pregnant.  Ask for birth control (contraception) if you want to prevent pregnancy. Osteoporosis and menopause Osteoporosis is a disease in which the bones lose minerals and strength with aging. This can result in bone fractures. If you are 21 years old or older, or if you are at risk for osteoporosis and fractures, ask your health care provider if you should:  Be screened for bone loss.  Take a calcium or vitamin D supplement to lower your risk of fractures.  Be given hormone replacement therapy (HRT) to treat symptoms of menopause. Follow these instructions at home: Lifestyle  Do not use any products that contain nicotine or tobacco, such as cigarettes, e-cigarettes, and chewing tobacco. If you need help quitting, ask your health care provider.  Do not use street drugs.  Do not share needles.  Ask your health care provider for help if you need support or information about quitting drugs. Alcohol use  Do not drink alcohol if: ? Your health care provider tells you not to drink. ? You are pregnant, may be pregnant, or are planning to become pregnant.  If you drink alcohol: ? Limit how much you use to 0-1 drink a day. ? Limit intake if you are breastfeeding.  Be aware of how much alcohol is in your drink. In the [place] one drink equals one 12 oz bottle of beer (355 mL), one 5 oz glass of wine (148 mL), or one 1 oz glass of hard liquor (44 mL). General instructions  Schedule regular health, dental, and eye exams.  Stay current with your vaccines.  Tell your health care provider if: ? You often feel  depressed. ? You have ever been abused or do not feel safe at home. Summary  Adopting a healthy lifestyle and getting preventive care are important in promoting health and wellness.  Follow your health care provider's instructions about healthy diet, exercising, and getting tested or screened for diseases.  Follow your health care provider's instructions on monitoring your cholesterol and blood pressure. This information is not intended to replace advice given to you by your health care provider. Make sure you discuss any questions you have with your health care provider. Document Revised: 05/18/2018 Document Reviewed: 05/18/2018 Elsevier Patient Education  2020 [employer].

## 2020-01-17 ENCOUNTER — Other Ambulatory Visit: Payer: Self-pay | Admitting: Family Medicine

## 2020-01-23 ENCOUNTER — Telehealth: Payer: Self-pay | Admitting: General Practice

## 2020-01-23 NOTE — Telephone Encounter (Signed)
Pt would is scheduled to see Dr. Kennon Rounds on 02/13/2020 and would like to move appt sooner.  Left message on VM for pt to give our office a call to schedule.

## 2020-01-25 ENCOUNTER — Encounter: Payer: Self-pay | Admitting: Obstetrics and Gynecology

## 2020-01-25 ENCOUNTER — Other Ambulatory Visit: Payer: Self-pay | Admitting: Obstetrics and Gynecology

## 2020-01-25 ENCOUNTER — Ambulatory Visit (INDEPENDENT_AMBULATORY_CARE_PROVIDER_SITE_OTHER): Payer: BC Managed Care – PPO | Admitting: Obstetrics and Gynecology

## 2020-01-25 ENCOUNTER — Other Ambulatory Visit: Payer: Self-pay

## 2020-01-25 ENCOUNTER — Other Ambulatory Visit (HOSPITAL_COMMUNITY)
Admission: RE | Admit: 2020-01-25 | Discharge: 2020-01-25 | Disposition: A | Discharge status: Home / Self Care | Payer: BC Managed Care – PPO | Source: Ambulatory Visit | Attending: Obstetrics and Gynecology | Admitting: Obstetrics and Gynecology

## 2020-01-25 VITALS — BP 121/84 | HR 91 | Ht 68.0 in | Wt 330.0 lb

## 2020-01-25 DIAGNOSIS — Z01419 Encounter for gynecological examination (general) (routine) without abnormal findings: Secondary | ICD-10-CM | POA: Diagnosis not present

## 2020-01-25 DIAGNOSIS — N946 Dysmenorrhea, unspecified: Secondary | ICD-10-CM | POA: Diagnosis not present

## 2020-01-25 DIAGNOSIS — N939 Abnormal uterine and vaginal bleeding, unspecified: Secondary | ICD-10-CM | POA: Diagnosis not present

## 2020-01-25 HISTORY — PX: ENDOMETRIAL BIOPSY: PRO73

## 2020-01-25 LAB — CBC
Hematocrit: 40 % (ref 34.0–46.6)
Hemoglobin: 13.3 g/dL (ref 11.1–15.9)
MCH: 32.3 pg (ref 26.6–33.0)
MCHC: 33.3 g/dL (ref 31.5–35.7)
MCV: 97 fL (ref 79–97)
Platelets: 322 10*3/uL (ref 150–450)
RBC: 4.12 x10E6/uL (ref 3.77–5.28)
RDW: 11.8 % (ref 11.7–15.4)
WBC: 8.6 10*3/uL (ref 3.4–10.8)

## 2020-01-25 MED ORDER — MEGESTROL ACETATE 40 MG PO TABS: 40.0000 mg | ORAL_TABLET | Freq: Two times a day (BID) | ORAL | 0 refills | Status: DC

## 2020-01-25 NOTE — Progress Notes (Signed)
Obstetrics and Gynecology Annual Patient Evaluation  Appointment Date: 01/25/2020  OBGYN Clinic: [facility]  Primary Care Provider: Ria Bush  Referring Provider: Ria Bush, MD  Chief Complaint:  Chief Complaint  Patient presents with  . Gynecologic Exam  AUB  History of Present Illness: Cassandra [name] is a 44 y.o. Caucasian G2P2002 (Patient's last menstrual period was 01/01/2020 (exact date).), seen for the above chief complaint. Her past medical history is significant for BMI 50, h/o c-section x 2, h/o 2017 endometrial ablation, HIV+, tobacco, HTN, DM2, l/s chole  Patient had failed IUD and then had a Minerva ablation in 2017 by Dr. Kennon Rounds for her heavy menstrual bleeding. She states that after about two months, her periods came back and were regular and monthly and like before. She states it progressed to AUB. She has been without megace for about the past 2-3 months and w/o the megace she has 2-3 periods a month that are also crampy. The megace prevents a period and it also stops one.   Review of Systems: Pertinent items noted in HPI and remainder of comprehensive ROS otherwise negative.   Patient Active Problem List   Diagnosis Date Noted  . Migraine 06/30/2019  . Vitamin B12 deficiency 12/31/2018  . Paresthesia of right foot 12/27/2018  . GERD (gastroesophageal reflux disease) 12/27/2018  . Polyp of descending colon   . Chronic diarrhea 04/04/2018  . Cervical high risk human papillomavirus (HPV) DNA test positive 11/17/2017  . Closed compression fracture of L1 lumbar vertebra 07/05/2016  . Lumbar pain with radiation down both legs 12/20/2015  . Health maintenance examination 05/25/2014  . Ulnar neuropathy 06/28/2013  . Heavy menstrual bleeding 10/19/2012  . Hypertension 09/14/2012  . HIV (human immunodeficiency virus infection) ([place]) 01/04/2012  . Varicose vein 01/01/2012  . MDD (major depressive disorder), recurrent  episode, moderate ([place]) 09/02/2011  . Morbid obesity with BMI of 45.0-49.9, adult ([place])   . Ex-smoker   . Uncontrolled diabetes mellitus with complication, without long-term current use of insulin (HCC)     Past Medical History:  Past Medical History:  Diagnosis Date  . Allergy   . 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 Anxiety   . Constipation    alternating from constipation to diarrhea  . Depression   . Diabetes mellitus without complication ([place])   . Gallstones 06/2016   by xray - surgery to remove  . Generalized headaches    frequent  . GERD (gastroesophageal reflux disease)   . History of abnormal Pap smear    remote  . History of gestational diabetes    first 2 pregnancies  . 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[place])    CD4 level is 1100 per patient  . HSV-2 seropositive   . Hypertension   . Kidney stones   . Morbid obesity ([place])   . Periodontal disease 08/2011   currently getting dental work  . Tobacco use     Past Surgical History:  Past Surgical History:  Procedure Laterality Date  . BIOPSY  07/04/2018   Procedure: BIOPSY;  Surgeon: Thornton Park, MD;  Location: [facility];  Service: Gastroenterology;;  . CESAREAN SECTION  [id number]   x2  . CHOLECYSTECTOMY  2018  . COLONOSCOPY WITH PROPOFOL N/A 07/04/2018   for chronic diarrhea after cholecystectomy started on colestipol with benefit ([place]). Thought she has IBS-D. 2 TA, rpt 5 yrs  . HYSTEROSCOPY WITH D & C N/A 01/05/2018   Minerva for heavy bleeding,  IUD removed - DILATATION AND CURETTAGE /HYSTEROSCOPY WITH MINERVA ABLATION; [name], MD  . [place]  . POLYPECTOMY  07/04/2018   Procedure: POLYPECTOMY;  Surgeon: Thornton Park, MD;  Location: [facility];  Service: Gastroenterology;;  . TUBAL LIGATION    . WISDOM TOOTH EXTRACTION     x 4    Past Obstetrical History:  OB History  Gravida Para Term Preterm AB Living  2 2 2  0 0 2  SAB TAB Ectopic Multiple Live Births   0 0 0 0 2    # Outcome Date GA Lbr Len/2nd Weight Sex Delivery Anes PTL Lv  2 Term 2009    M CS-Unspec   LIV  1 Term 1999    M CS-Unspec   LIV    Past Gynecological History: As per HPI. History of Pap Smear(s): Yes.   Last pap 2019, which was HPV+  Social History:  Social History   Socioeconomic History  . Marital status: Married    Spouse name: Not on file  . Number of children: 2  . Years of education: Not on file  . Highest education level: Not on file  Occupational History  . Occupation: CSR  Tobacco Use  . Smoking status: Current Some Day Smoker    Packs/day: 0.10    Years: 23.00    Pack years: 2.30    Types: Cigarettes    Last attempt to quit: 12/20/2018    Years since quitting: 1.0  . Smokeless tobacco: Never Used  . Tobacco comment: as of 04/27/18 working on quitting  Vaping Use  . Vaping Use: Never used  Substance and Sexual Activity  . Alcohol use: Yes    Alcohol/week: 0.0 standard drinks    Comment: occasional  . Drug use: No  . Sexual activity: Yes    Partners: Male    Birth control/protection: Surgical  Other Topics Concern  . Not on file  Social History Narrative   Lives with husband and 2 children, no pets   Occupation: call center rep   Edu: some college   Activity: no regular exercise.  Tries to walk around building.   Diet: 1 mt dew in am, rest water, fruits/vegetables daily    Social Determinants of Health   Financial Resource Strain:   . Difficulty of Paying Living Expenses: Not on file  Food Insecurity:   . Worried About Charity fundraiser in the Last Year: Not on file  . Ran Out of Food in the Last Year: Not on file  Transportation Needs:   . Lack of Transportation (Medical): Not on file  . Lack of Transportation (Non-Medical): Not on file  Physical Activity:   . Days of Exercise per Week: Not on file  . Minutes of Exercise per Session: Not on file  Stress:   . Feeling of Stress : Not on file  Social Connections:   . Frequency  of Communication with Friends and Family: Not on file  . Frequency of Social Gatherings with Friends and Family: Not on file  . Attends Religious Services: Not on file  . Active Member of Clubs or Organizations: Not on file  . Attends Archivist Meetings: Not on file  . Marital Status: Not on file  Intimate Partner Violence:   . Fear of Current or Ex-Partner: Not on file  . Emotionally Abused: Not on file  . Physically Abused: Not on file  . Sexually Abused: Not on file  Family History:  Family History  Problem Relation Age of Onset  . Hypertension Mother   . Hypertension Father   . Appendicitis Father   . Atrial fibrillation Father   . Diabetes Maternal Grandmother        s/p amputation  . Hyperlipidemia Maternal Grandmother   . Hypertension Maternal Grandmother   . Stroke Maternal Grandmother   . Hyperlipidemia Maternal Grandfather   . Hypertension Maternal Grandfather   . Skin cancer Maternal Grandfather   . Diabetes Maternal Grandfather   . Seizures Paternal Grandmother 20       deceased  . Coronary artery disease Paternal Grandfather 29       MI  . Breast cancer Neg Hx   . Colon cancer Neg Hx   . Colon polyps Neg Hx   . Esophageal cancer Neg Hx   . Rectal cancer Neg Hx   . Stomach cancer Neg Hx     Medications Terese Door. Langhans had no medications administered during this visit. Current Outpatient Medications  Medication Sig Dispense Refill  . acetaminophen (TYLENOL) 500 MG tablet Take 1,000 mg by mouth 2 (two) times daily as needed for moderate pain or headache.     Marland Kitchen amLODipine (NORVASC) 5 MG tablet TAKE 1 TABLET BY MOUTH EVERY DAY 90 tablet 3  . BIKTARVY 50-200-25 MG TABS tablet Take 1 tablet by mouth at bedtime. 30 tablet 5  . citalopram (CELEXA) 20 MG tablet TAKE 1 TABLET BY MOUTH EVERY DAY 90 tablet 3  . colestipol (COLESTID) 1 g tablet TAKE 2 TABLETS (2 G TOTAL) BY MOUTH 2 (TWO) TIMES DAILY. 360 tablet 0  . Cyanocobalamin (B-12) 1000 MCG SUBL  Place 1 tablet under the tongue daily.    Marland Kitchen glucose blood (ACCU-CHEK GUIDE) test strip 1 each by Other route as needed for other. Use as instructed to check blood sugar 100 each 0  . hydrochlorothiazide (HYDRODIURIL) 25 MG tablet TAKE 1 TABLET DAILY 90 tablet 3  . lisinopril (ZESTRIL) 10 MG tablet Take 1 tablet (10 mg total) by mouth daily. 30 tablet 6  . Multiple Vitamin (MULTIVITAMIN WITH MINERALS) TABS tablet Take 1 tablet by mouth at bedtime.    . pantoprazole (PROTONIX) 40 MG tablet TAKE 1 TABLET BY MOUTH TWICE A DAY 180 tablet 0  . Peppermint Oil (IBGARD PO) Take 2 tablets by mouth daily.    . Semaglutide, 1 MG/DOSE, (OZEMPIC, 1 MG/DOSE,) 4 MG/3ML SOPN Inject 0.75 mLs (1 mg total) into the skin once a week. 9 mL 3  . topiramate (TOPAMAX) 25 MG tablet TAKE 1 TABLET BY MOUTH EVERYDAY AT BEDTIME 90 tablet 2  . Accu-Chek FastClix Lancets MISC 1 each by Does not apply route as directed. Use as directed to check blood sugar twice daily as needed. E11.8 102 each 3  . cyclobenzaprine (FLEXERIL) 5 MG tablet Take 1 tablet (5 mg total) by mouth 2 (two) times daily as needed (headache (sedation precautions)). (Patient not taking: Reported on 01/25/2020) 30 tablet 3  . megestrol (MEGACE) 40 MG tablet TAKE 1 TABLET BY MOUTH TWICE A DAY (Patient not taking: Reported on 01/25/2020) 180 tablet 1   No current facility-administered medications for this visit.    Allergies Metformin and related   Physical Exam:  BP 121/84   Pulse 91   Ht 5\' 8"  (1.727 m)   Wt (!) 330 lb (149.7 kg)   LMP 01/01/2020 (Exact Date)   BMI 50.18 kg/m  Body mass index is 50.18 kg/m. General appearance:  Well nourished, well developed female in no acute distress.  Neck:  Supple, normal appearance, and no thyromegaly  Cardiovascular: normal s1 and s2.  No murmurs, rubs or gallops. Respiratory:  Clear to auscultation bilateral. Normal respiratory effort Abdomen: positive bowel sounds and no masses, hernias; diffusely non tender  to palpation, non distended Breasts: breasts appear normal, no suspicious masses, no skin or nipple changes or axillary nodes, and normal palpation. Neuro/Psych:  Normal mood and affect.  Skin:  Warm and dry.  Lymphatic:  No inguinal lymphadenopathy.   Pelvic exam: is limited by body habitus EGBUS: within normal limits Vagina: within normal limits and with no blood or discharge in the vault Cervix: normal appearing cervix without tenderness, discharge or lesions. Uterus:  nonenlarged and non tender Adnexa:  normal adnexa and no mass, fullness, tenderness Rectovaginal: deferred  See procedure note for embx  Laboratory: none  Radiology: none  Assessment: pt stable  Plan:  1. Well woman exam  - Cytology - PAP - MM 3D SCREEN BREAST BILATERAL; Future  2. Abnormal uterine bleeding (AUB) Recommend transvag u/s and follow up on labwork but I told her that I'm likely going to recommend a total hysterectomy or she could continue on th megace indefinitely but she desires definitive treatment.   I reviewed with her re: risks, including scar tissue, especially since cervix is high. I told her I'd try and do it laparoscopically but risk of open procedure. Will d/w her more once results are back.  - Surgical pathology( [place]/ [facility])  Orders Placed This Encounter  Procedures  . MM 3D SCREEN BREAST BILATERAL  . US PELVIC COMPLETE WITH TRANSVAGINAL  . TSH  . CBC  . Prolactin    Durene Romans MD Attending Center for [facility])

## 2020-01-25 NOTE — Progress Notes (Signed)
Has had 3 periods within this past month  Out of megace, which helps her bleeding

## 2020-01-25 NOTE — Procedures (Signed)
Endometrial Biopsy Procedure Note  Pre-operative Diagnosis: AUB. H/o ablation  Post-operative Diagnosis: same  Procedure Details  The risks (including infection, bleeding, pain, and uterine perforation) and benefits of the procedure were explained to the patient and Written informed consent was obtained.   The patient was placed in the dorsal lithotomy position.  Bimanual exam showed the uterus to be in the anteroflexed position and cervix very anterior  A large Graves' speculum inserted in the vagina, and the cervix was barely visualized and very anterior. A single toothed tenaculum was placed on the anterior lip to bring it completely into view and a pap smear performed. The cervix was then prepped with povidone iodine.  A pipelle was inserted into the uterine cavity and sounded the uterus to a depth of 10cm.  A Large amount of clot and scant amount of tissue was collected after 2 passes. The sample was sent for pathologic examination.  Condition: Stable  Complications: None  Plan: The patient was advised to call for any fever or for prolonged or severe pain or bleeding. She was advised to use OTC analgesics as needed for mild to moderate pain. She was advised to avoid vaginal intercourse for 48 hours or until the bleeding has completely stopped.  Durene Romans MD Attending [facility] for [facility])

## 2020-01-26 LAB — PROLACTIN: Prolactin: 16.9 ng/mL (ref 4.8–23.3)

## 2020-01-26 LAB — TSH: TSH: 1.4 u[IU]/mL (ref 0.450–4.500)

## 2020-01-30 ENCOUNTER — Other Ambulatory Visit: Payer: Self-pay

## 2020-01-30 ENCOUNTER — Ambulatory Visit
Admission: RE | Admit: 2020-01-30 | Discharge: 2020-01-30 | Disposition: A | Discharge status: Home / Self Care | Payer: BC Managed Care – PPO | Source: Ambulatory Visit | Attending: Obstetrics and Gynecology | Admitting: Obstetrics and Gynecology

## 2020-01-30 DIAGNOSIS — Z01419 Encounter for gynecological examination (general) (routine) without abnormal findings: Secondary | ICD-10-CM | POA: Diagnosis not present

## 2020-01-30 DIAGNOSIS — N939 Abnormal uterine and vaginal bleeding, unspecified: Secondary | ICD-10-CM | POA: Insufficient documentation

## 2020-01-30 LAB — CYTOLOGY - PAP
Chlamydia: NEGATIVE
Comment: NEGATIVE
Comment: NEGATIVE
Comment: NEGATIVE
Comment: NEGATIVE
Comment: NORMAL
Diagnosis: NEGATIVE
Diagnosis: REACTIVE
HPV 16: NEGATIVE
HPV 18 / 45: NEGATIVE
High risk HPV: POSITIVE — AB
Neisseria Gonorrhea: NEGATIVE
Trichomonas: NEGATIVE

## 2020-02-03 ENCOUNTER — Other Ambulatory Visit: Payer: Self-pay | Admitting: Gastroenterology

## 2020-02-05 ENCOUNTER — Encounter: Payer: Self-pay | Admitting: Obstetrics and Gynecology

## 2020-02-05 DIAGNOSIS — B977 Papillomavirus as the cause of diseases classified elsewhere: Secondary | ICD-10-CM | POA: Insufficient documentation

## 2020-02-13 ENCOUNTER — Ambulatory Visit: Payer: BC Managed Care – PPO | Admitting: Family Medicine

## 2020-02-14 ENCOUNTER — Encounter: Payer: Self-pay | Admitting: Family Medicine

## 2020-02-21 ENCOUNTER — Ambulatory Visit (INDEPENDENT_AMBULATORY_CARE_PROVIDER_SITE_OTHER): Payer: BC Managed Care – PPO

## 2020-02-21 ENCOUNTER — Other Ambulatory Visit: Payer: Self-pay

## 2020-02-21 DIAGNOSIS — E538 Deficiency of other specified B group vitamins: Secondary | ICD-10-CM | POA: Diagnosis not present

## 2020-02-21 MED ORDER — CYANOCOBALAMIN 1000 MCG/ML IJ SOLN
1000.0000 ug | Freq: Once | INTRAMUSCULAR | Status: AC
  Administered 2020-02-21: 1000 ug via INTRAMUSCULAR

## 2020-02-21 NOTE — Progress Notes (Signed)
Per orders of Dr. Ria Bush, injection of B12 in Right Deltoid given by Lurlean Nanny.  Patient tolerated injection well.

## 2020-02-22 ENCOUNTER — Other Ambulatory Visit: Payer: Self-pay | Admitting: Obstetrics and Gynecology

## 2020-02-22 DIAGNOSIS — N946 Dysmenorrhea, unspecified: Secondary | ICD-10-CM

## 2020-02-28 ENCOUNTER — Other Ambulatory Visit: Payer: Self-pay | Admitting: Obstetrics and Gynecology

## 2020-02-28 DIAGNOSIS — N946 Dysmenorrhea, unspecified: Secondary | ICD-10-CM

## 2020-02-28 MED ORDER — MEGESTROL ACETATE 40 MG PO TABS: 40.0000 mg | ORAL_TABLET | Freq: Two times a day (BID) | ORAL | 1 refills | Status: AC

## 2020-03-22 ENCOUNTER — Other Ambulatory Visit: Payer: Self-pay | Admitting: Obstetrics and Gynecology

## 2020-03-22 DIAGNOSIS — N946 Dysmenorrhea, unspecified: Secondary | ICD-10-CM

## 2020-03-27 ENCOUNTER — Ambulatory Visit: Payer: BC Managed Care – PPO

## 2020-03-27 ENCOUNTER — Other Ambulatory Visit: Payer: Self-pay

## 2020-04-01 ENCOUNTER — Other Ambulatory Visit: Payer: Self-pay | Admitting: Obstetrics and Gynecology

## 2020-04-01 MED ORDER — MEGESTROL ACETATE 40 MG PO TABS: 40.0000 mg | ORAL_TABLET | Freq: Two times a day (BID) | ORAL | 1 refills | Status: DC

## 2020-04-02 ENCOUNTER — Other Ambulatory Visit: Payer: Self-pay

## 2020-04-02 ENCOUNTER — Ambulatory Visit (INDEPENDENT_AMBULATORY_CARE_PROVIDER_SITE_OTHER)
Admission: RE | Admit: 2020-04-02 | Discharge: 2020-04-02 | Disposition: A | Discharge status: Home / Self Care | Payer: BC Managed Care – PPO | Source: Ambulatory Visit | Attending: Family Medicine | Admitting: Family Medicine

## 2020-04-02 ENCOUNTER — Encounter: Payer: Self-pay | Admitting: Family Medicine

## 2020-04-02 ENCOUNTER — Ambulatory Visit (INDEPENDENT_AMBULATORY_CARE_PROVIDER_SITE_OTHER): Payer: BC Managed Care – PPO | Admitting: Family Medicine

## 2020-04-02 VITALS — BP 118/88 | HR 97 | Temp 97.7°F | Ht 68.0 in | Wt 311.1 lb

## 2020-04-02 DIAGNOSIS — Z6841 Body Mass Index (BMI) 40.0 and over, adult: Secondary | ICD-10-CM

## 2020-04-02 DIAGNOSIS — I10 Essential (primary) hypertension: Secondary | ICD-10-CM

## 2020-04-02 DIAGNOSIS — Z23 Encounter for immunization: Secondary | ICD-10-CM

## 2020-04-02 DIAGNOSIS — Z01818 Encounter for other preprocedural examination: Secondary | ICD-10-CM

## 2020-04-02 DIAGNOSIS — IMO0002 Reserved for concepts with insufficient information to code with codable children: Secondary | ICD-10-CM

## 2020-04-02 DIAGNOSIS — E1165 Type 2 diabetes mellitus with hyperglycemia: Secondary | ICD-10-CM

## 2020-04-02 DIAGNOSIS — E538 Deficiency of other specified B group vitamins: Secondary | ICD-10-CM | POA: Diagnosis not present

## 2020-04-02 DIAGNOSIS — E118 Type 2 diabetes mellitus with unspecified complications: Secondary | ICD-10-CM | POA: Diagnosis not present

## 2020-04-02 DIAGNOSIS — Z21 Asymptomatic human immunodeficiency virus [HIV] infection status: Secondary | ICD-10-CM | POA: Diagnosis not present

## 2020-04-02 LAB — POCT GLYCOSYLATED HEMOGLOBIN (HGB A1C): Hemoglobin A1C: 7.1 % — AB (ref 4.0–5.6)

## 2020-04-02 MED ORDER — CYANOCOBALAMIN 1000 MCG/ML IJ SOLN
1000.0000 ug | Freq: Once | INTRAMUSCULAR | Status: AC
Start: 2020-04-02 — End: 2020-04-02
  Administered 2020-04-02: 1000 ug via INTRAMUSCULAR

## 2020-04-02 NOTE — Progress Notes (Signed)
This visit was conducted in person.  BP 118/88 (BP Location: Left Arm, Patient Position: Sitting, Cuff Size: Large)   Pulse 97   Temp 97.7 F (36.5 C) (Temporal)   Ht 5\' 8"  (1.727 m)   Wt (!) 311 lb 1 oz (141.1 kg)   SpO2 97%   BMI 47.30 kg/m    CC: preop eval  Subjective:    Patient ID: Cassandra [name], female    DOB: 02-21-1976, 44 y.o.   MRN: [id number]  HPI: Cassandra [name] is a 44 y.o. female presenting on 04/02/2020 for Pre-op Exam (Needs hysterectomy. Not yet scheduled. )   Seeing GYN Dr Ilda Basset planned hysterectomy for ongoing abnormal uterine bleeding - failed IUD then Minerva ablation (2017). Has been on megace since - if runs out can have up to 3 periods with cramping. She is excited to have hysterectomy.  LMP 01/2020 - has been taking megestrol since then. No chances of pregnancy - s/p BTL.   HIV - sees Dr Delaine Lame ID twice yearly, continues Biktarvy with good suppression and undetectable viral load.   DM - does not regularly check sugars. Compliant with antihyperglycemic regimen which includes: ozempic 1mg  weekly. Denies low sugars or hypoglycemic symptoms. Denies paresthesias. Last diabetic eye exam DUE. Pneumovax: 2016. Prevnar: not due. Glucometer brand: accuchek. DSME: completed 07/2019. Lab Results  Component Value Date   HGBA1C 7.1 (A) 04/02/2020   Diabetic Foot Exam - Simple   No data filed     Lab Results  Component Value Date   MICROALBUR 1.3 12/27/2019    Morbid obesity - 42 lbs down since December! 30+ lb weight loss in the past 4 months, 15lbs in the past 2 months. 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 Controlling diet better since eating from home, taking walks during lunch, overall healthier living. Recent increase in ozempic dose also helping.   Planning GETA, partial hysterectomy with ovaries to remain.  She has tolerated general anesthesia in the past - latest  surgery cholecystectomy 2018 which she tolerated well. No h/o postop nausea/vomiting or trouble waking up from anesthesia. She had an allergic reaction to epidural during son's delivery - itchy nose, red face without trouble breathing - treated with benadryl with good effect.   Denies fevers/chills, chest pain, tightness, dyspnea, palpitations, headache, dizziness. No cough, cellulitis.  Diarrhea much better off metformin.   Vit B12 deficiency - continues monthly b12 shots, latest shot was already done today.      Relevant past medical, surgical, family and social history reviewed and updated as indicated. Interim medical history since our last visit reviewed. Allergies and medications reviewed and updated. Outpatient Medications Prior to Visit  Medication Sig Dispense Refill  . Accu-Chek FastClix Lancets MISC 1 each by Does not apply route as directed. Use as directed to check blood sugar twice daily as needed. E11.8 102 each 3  . acetaminophen (TYLENOL) 500 MG tablet Take 1,000 mg by mouth 2 (two) times daily as needed for moderate pain or headache.     Marland Kitchen amLODipine (NORVASC) 5 MG tablet TAKE 1 TABLET BY MOUTH EVERY DAY 90 tablet 3  . BIKTARVY 50-200-25 MG TABS tablet Take 1 tablet by mouth at bedtime. 30 tablet 5  . citalopram (CELEXA) 20 MG tablet TAKE 1 TABLET BY MOUTH EVERY DAY 90 tablet 3  . colestipol (COLESTID) 1 g tablet TAKE 2 TABLETS (2 G TOTAL) BY MOUTH 2 (TWO)  TIMES DAILY. 360 tablet 0  . cyclobenzaprine (FLEXERIL) 5 MG tablet Take 1 tablet (5 mg total) by mouth 2 (two) times daily as needed (headache (sedation precautions)). 30 tablet 3  . glucose blood (ACCU-CHEK GUIDE) test strip 1 each by Other route as needed for other. Use as instructed to check blood sugar 100 each 0  . hydrochlorothiazide (HYDRODIURIL) 25 MG tablet TAKE 1 TABLET DAILY 90 tablet 3  . lisinopril (ZESTRIL) 10 MG tablet Take 1 tablet (10 mg total) by mouth daily. 30 tablet 6  . megestrol (MEGACE) 40 MG tablet  Take 1 tablet (40 mg total) by mouth 2 (two) times daily. Can increase to two tablets twice a day in the event of heavy bleeding 60 tablet 1  . Multiple Vitamin (MULTIVITAMIN WITH MINERALS) TABS tablet Take 1 tablet by mouth at bedtime.    . pantoprazole (PROTONIX) 40 MG tablet TAKE 1 TABLET BY MOUTH TWICE A DAY 180 tablet 0  . Peppermint Oil (IBGARD PO) Take 2 tablets by mouth daily.    . Semaglutide, 1 MG/DOSE, (OZEMPIC, 1 MG/DOSE,) 4 MG/3ML SOPN Inject 0.75 mLs (1 mg total) into the skin once a week. 9 mL 3  . topiramate (TOPAMAX) 25 MG tablet TAKE 1 TABLET BY MOUTH EVERYDAY AT BEDTIME 90 tablet 2  . Cyanocobalamin (B-12) 1000 MCG SUBL Place 1 tablet under the tongue daily.     No facility-administered medications prior to visit.     Per HPI unless specifically indicated in ROS section below Review of Systems Objective:  BP 118/88 (BP Location: Left Arm, Patient Position: Sitting, Cuff Size: Large)   Pulse 97   Temp 97.7 F (36.5 C) (Temporal)   Ht 5\' 8"  (1.727 m)   Wt (!) 311 lb 1 oz (141.1 kg)   SpO2 97%   BMI 47.30 kg/m   Wt Readings from Last 3 Encounters:  04/02/20 (!) 311 lb 1 oz (141.1 kg)  01/25/20 (!) 330 lb (149.7 kg)  01/16/20 (!) 327 lb (148.3 kg)      Physical Exam Vitals and nursing note reviewed.  Constitutional:      Appearance: Normal appearance. She is obese. She is not ill-appearing.  Cardiovascular:     Rate and Rhythm: Normal rate and regular rhythm.     Pulses: Normal pulses.     Heart sounds: Normal heart sounds. No murmur heard.   Pulmonary:     Effort: Pulmonary effort is normal. No respiratory distress.     Breath sounds: Normal breath sounds. No wheezing, rhonchi or rales.  Musculoskeletal:     Right lower leg: No edema.     Left lower leg: No edema.  Skin:    General: Skin is warm and dry.     Findings: No rash.  Neurological:     Mental Status: She is alert.  Psychiatric:        Mood and Affect: Mood normal.        Behavior: Behavior  normal.       Results for orders placed or performed in visit on 04/02/20  POCT glycosylated hemoglobin (Hb A1C)  Result Value Ref Range   Hemoglobin A1C 7.1 (A) 4.0 - 5.6 %   HbA1c POC (<> result, manual entry)     HbA1c, POC (prediabetic range)     HbA1c, POC (controlled diabetic range)     EKG - NSR rate 80s, normal axis, intervals, no acute ST/T changes.  Assessment & Plan:  This visit occurred during the SARS-CoV-2  public health emergency.  Safety protocols were in place, including screening questions prior to the visit, additional usage of staff PPE, and extensive cleaning of exam room while observing appropriate contact time as indicated for disinfecting solutions.   Problem List Items Addressed This Visit    Vitamin B12 deficiency    Continues monthly b12 shots - already had IM injection today so will not check levels.  Consider checking IF Ab at next labs       Uncontrolled diabetes mellitus with complication, without long-term current use of insulin (HCC)    Update A1c on full dose ozempic. With weight loss anticipate significant improvement.  Diarrhea has improved off metformin      Relevant Orders   POCT glycosylated hemoglobin (Hb A1C) (Completed)   Pre-op evaluation - Primary    RCRI = 0 Anticipate adequately low risk to proceed with surgery without further evaluation.  Check EKG, CXR and CMP today along with viral load and CD4 count in HIV history.  CXR and labs pending.       Relevant Orders   EKG 12-Lead (Completed)   Comprehensive metabolic panel   DG Chest 2 View   Morbid obesity with BMI of 45.0-49.9, adult ([place])    Congratulated on ongoing weight loss - she is motivated to continue healthy diet and lifestyle changes. Continue ozempic 1mg  weekly.       Hypertension    Chronic, stable - continue current regimen of amlodipine 5mg  daily, hctz 25mg  daily, lisinopril 10mg  daily. With weight loss, monitor for hypotension.       HIV (human immunodeficiency  virus infection) (HCC)    Se[facility] ID Dr [facility] at [facility].  Update viral load and CD4 count prior to upcoming surgery.       Relevant Orders   HIV 1 RNA quant-no reflex-bld   Lymph Enumeration,Helper/Suppressor    Other Visit Diagnoses    Need for influenza vaccination       Relevant Orders   Flu Vaccine QUAD 36+ mos IM (Completed)       Meds ordered this encounter  Medications  . cyanocobalamin ((VITAMIN B-12)) injection 1,000 mcg   Orders Placed This Encounter  Procedures  . DG Chest 2 View    Standing Status:   Future    Number of Occurrences:   1    Standing Expiration Date:   04/02/2021    Order Specific Question:   Reason for Exam (SYMPTOM  OR DIAGNOSIS REQUIRED)    Answer:   preop eval    Order Specific Question:   Is patient pregnant?    Answer:   No    Order Specific Question:   Preferred imaging location?    Answer:   [facility]  . Flu Vaccine QUAD 36+ mos IM  . Comprehensive metabolic panel  . HIV 1 RNA quant-no reflex-bld  . Lymph Enumeration,Helper/Suppressor  . POCT glycosylated hemoglobin (Hb A1C)  . EKG 12-Lead    Patient Instructions  B12 shot today  Flu shot today  Chest xray today  Labs today EKG today  We will forward results to Dr Ilda Basset.  I hope for a speedy recovery!  Schedule eye exam when able.   Follow up plan: Return if symptoms worsen or fail to improve.  Ria Bush, MD

## 2020-04-02 NOTE — Assessment & Plan Note (Addendum)
Continues monthly b12 shots - already had IM injection today so will not check levels.  Consider checking IF Ab at next labs

## 2020-04-02 NOTE — Patient Instructions (Addendum)
B12 shot today  Flu shot today  Chest xray today  Labs today EKG today  We will forward results to Dr Ilda Basset.  I hope for a speedy recovery!  Schedule eye exam when able.

## 2020-04-02 NOTE — Assessment & Plan Note (Addendum)
Sees ID Dr Delaine Lame at [facility].  Update viral load and CD4 count prior to upcoming surgery.

## 2020-04-02 NOTE — Assessment & Plan Note (Signed)
Update A1c on full dose ozempic. With weight loss anticipate significant improvement.  Diarrhea has improved off metformin

## 2020-04-02 NOTE — Assessment & Plan Note (Signed)
Congratulated on ongoing weight loss - she is motivated to continue healthy diet and lifestyle changes. Continue ozempic 1mg  weekly.

## 2020-04-02 NOTE — Assessment & Plan Note (Signed)
AUB managed on megace - pending hysterectomy.  Weight loss hasn't significantly improved heavy bleeding.

## 2020-04-02 NOTE — Assessment & Plan Note (Signed)
Chronic, stable - continue current regimen of amlodipine 5mg  daily, hctz 25mg  daily, lisinopril 10mg  daily. With weight loss, monitor for hypotension.

## 2020-04-02 NOTE — Assessment & Plan Note (Addendum)
RCRI = 0 Anticipate adequately low risk to proceed with surgery without further evaluation.  Check EKG, CXR and CMP today along with viral load and CD4 count in HIV history.  CXR and labs pending.

## 2020-04-03 LAB — COMPREHENSIVE METABOLIC PANEL
ALT: 59 U/L — ABNORMAL HIGH (ref 0–35)
AST: 48 U/L — ABNORMAL HIGH (ref 0–37)
Albumin: 4.4 g/dL (ref 3.5–5.2)
Alkaline Phosphatase: 73 U/L (ref 39–117)
BUN: 6 mg/dL (ref 6–23)
CO2: 27 mEq/L (ref 19–32)
Calcium: 9.3 mg/dL (ref 8.4–10.5)
Chloride: 102 mEq/L (ref 96–112)
Creatinine, Ser: 1.06 mg/dL (ref 0.40–1.20)
GFR: 64.7 mL/min (ref >60.00)
Glucose, Bld: 104 mg/dL — ABNORMAL HIGH (ref 70–99)
Potassium: 4.1 mEq/L (ref 3.5–5.1)
Sodium: 138 mEq/L (ref 135–145)
Total Bilirubin: 0.7 mg/dL (ref 0.2–1.2)
Total Protein: 7.3 g/dL (ref 6.0–8.3)

## 2020-04-03 LAB — LYMPH ENUMERATION,HELPER/SUPPRESSOR
%CD8 (Cytotoxic/Suppressor): 22 % (ref 12–42)
Absolute CD4: 1567 cells/uL (ref 490–1740)
CD4 T Helper %: 51 % (ref 30–61)
CD4/CD8 Ratio: 2.37 (ref 0.86–5.00)
CD8 T Cell Abs: 660 cells/uL (ref 180–1170)
Total lymphocyte count: 3051 cells/uL (ref 850–3900)

## 2020-04-05 LAB — HIV-1 RNA QUANT-NO REFLEX-BLD
HIV 1 RNA Quant: <20 Copies/mL
HIV-1 RNA Quant, Log: <1.3 Log cps/mL

## 2020-04-09 ENCOUNTER — Other Ambulatory Visit: Payer: Self-pay | Admitting: Obstetrics and Gynecology

## 2020-04-09 ENCOUNTER — Encounter: Payer: Self-pay | Admitting: Family Medicine

## 2020-04-09 NOTE — Telephone Encounter (Signed)
Ok to cx

## 2020-04-11 NOTE — Telephone Encounter (Signed)
Spoke with pt rescheduling f/u on 07/23/20 at 9:00.

## 2020-04-11 NOTE — Telephone Encounter (Signed)
Let's reschedule to 6mo from now

## 2020-04-19 ENCOUNTER — Other Ambulatory Visit: Payer: Self-pay | Admitting: Family Medicine

## 2020-04-22 ENCOUNTER — Ambulatory Visit: Payer: BC Managed Care – PPO | Admitting: Family Medicine

## 2020-04-30 ENCOUNTER — Ambulatory Visit (INDEPENDENT_AMBULATORY_CARE_PROVIDER_SITE_OTHER): Payer: BC Managed Care – PPO

## 2020-04-30 ENCOUNTER — Other Ambulatory Visit: Payer: Self-pay

## 2020-04-30 DIAGNOSIS — E538 Deficiency of other specified B group vitamins: Secondary | ICD-10-CM

## 2020-04-30 MED ORDER — CYANOCOBALAMIN 1000 MCG/ML IJ SOLN
1000.0000 ug | Freq: Once | INTRAMUSCULAR | Status: AC
  Administered 2020-04-30: 1000 ug via INTRAMUSCULAR

## 2020-04-30 NOTE — Progress Notes (Addendum)
Per orders of Danise Mina, injection of B12, given by Aneta Mins, RN. Patient tolerated injection well in R Deltoid.

## 2020-05-04 ENCOUNTER — Other Ambulatory Visit: Payer: Self-pay | Admitting: Gastroenterology

## 2020-05-06 ENCOUNTER — Other Ambulatory Visit: Payer: Self-pay | Admitting: *Deleted

## 2020-05-15 ENCOUNTER — Other Ambulatory Visit: Payer: Self-pay | Admitting: Obstetrics and Gynecology

## 2020-05-15 ENCOUNTER — Encounter: Payer: Self-pay | Admitting: Family Medicine

## 2020-05-21 ENCOUNTER — Ambulatory Visit: Payer: BC Managed Care – PPO | Attending: Infectious Diseases | Admitting: Infectious Diseases

## 2020-05-21 ENCOUNTER — Other Ambulatory Visit: Payer: Self-pay

## 2020-05-21 ENCOUNTER — Ambulatory Visit: Payer: BC Managed Care – PPO | Admitting: Infectious Diseases

## 2020-05-21 ENCOUNTER — Encounter: Payer: Self-pay | Admitting: Infectious Diseases

## 2020-05-21 VITALS — BP 129/70 | HR 110 | Temp 98.7°F | Resp 16 | Ht 68.0 in | Wt 311.2 lb

## 2020-05-21 DIAGNOSIS — I10 Essential (primary) hypertension: Secondary | ICD-10-CM | POA: Insufficient documentation

## 2020-05-21 DIAGNOSIS — B20 Human immunodeficiency virus [HIV] disease: Secondary | ICD-10-CM | POA: Diagnosis not present

## 2020-05-21 DIAGNOSIS — E539 Vitamin B deficiency, unspecified: Secondary | ICD-10-CM

## 2020-05-21 DIAGNOSIS — E119 Type 2 diabetes mellitus without complications: Secondary | ICD-10-CM | POA: Diagnosis not present

## 2020-05-21 DIAGNOSIS — F17200 Nicotine dependence, unspecified, uncomplicated: Secondary | ICD-10-CM | POA: Diagnosis not present

## 2020-05-21 DIAGNOSIS — E538 Deficiency of other specified B group vitamins: Secondary | ICD-10-CM | POA: Diagnosis not present

## 2020-05-21 DIAGNOSIS — Z23 Encounter for immunization: Secondary | ICD-10-CM

## 2020-05-21 NOTE — Patient Instructions (Addendum)
Today you got your first  dose of moderna- your 2nd dose will be scheduled 3rd week of Jan after your hysterectomy Continue [employer] excellent

## 2020-05-21 NOTE — Progress Notes (Signed)
NAME: Cassandra [name]  DOB: 20-Apr-1976  MRN: [id number]  Date/Time: 05/21/2020 9:48 AM   Subjective:  Here for follow up of HIV Since the last follow up in June 2021 she has lost more weight She is 100% adherent to HAART-on Biktarvy Last Vl < 20 and Cd4 1567 on 04/02/20 Is scheduled to undergo hysterectomy on 06/18/20 for menorrhagia, cramping   Medical history HIV diagnosed 2013 Nadir Cd4 >200 OI none HAARt history: first regimen Atripla 2nd regimen Biktarvy since Sept 2018  Acquired thru heterosexual contact. Husband HIV positive and on Rx   Past medical history Allergies to animal dander Gestational diabetes mellitus Diabetes mellitus GERD Hypertension Obesity Varicose veins Migraine Endometrial ablation for menorrhagia Gall stones Depression  Tubular adenoma colon.  Colonoscopy done in January 2020.   Past Surgical History:  Procedure Laterality Date  . BIOPSY  07/04/2018   Procedure: BIOPSY;  Surgeon: Thornton Park, MD;  Location: [facility];  Service: Gastroenterology;;  . CESAREAN SECTION  [phone]   x2  . CHOLECYSTECTOMY  2018  . COLONOSCOPY WITH PROPOFOL N/A 07/04/2018   for chronic diarrhea after cholecystectomy started on colestipol with benefit ([place]). Thought she has IBS-D. 2 TA, rpt 5 yrs  . ENDOMETRIAL BIOPSY  01/25/2020      . HYSTEROSCOPY WITH D & C N/A 01/05/2018   [facility] for heavy bleeding, IUD removed - DILATATION AND CURETTAGE /HYSTEROSCOPY WITH MINERVA ABLATION; [name], MD  . [place]  . POLYPECTOMY  07/04/2018   Procedure: POLYPECTOMY;  Surgeon: Thornton Park, MD;  Location: [facility];  Service: Gastroenterology;;  . TUBAL LIGATION    . WISDOM TOOTH EXTRACTION     x 4     SH Current smoker Occasional alcohol Lives with her husband [name] female partner  and 2 sons    Family History  Problem Relation Age of Onset  . Hypertension Mother   . Hypertension Father   . Appendicitis  Father   . Atrial fibrillation Father   . Diabetes Maternal Grandmother        s/p amputation  . Hyperlipidemia Maternal Grandmother   . Hypertension Maternal Grandmother   . Stroke Maternal Grandmother   . Hyperlipidemia Maternal Grandfather   . Hypertension Maternal Grandfather   . Skin cancer Maternal Grandfather   . Diabetes Maternal Grandfather   . Seizures Paternal Grandmother 36       deceased  . Coronary artery disease Paternal Grandfather 28       MI  . Breast cancer Neg Hx   . Colon cancer Neg Hx   . Colon polyps Neg Hx   . Esophageal cancer Neg Hx   . Rectal cancer Neg Hx   . Stomach cancer Neg Hx    Allergies  Allergen Reactions  . Metformin And Related Diarrhea    Diarrhea    ? Current Outpatient Medications  Medication Sig Dispense Refill  . megestrol (MEGACE) 40 MG tablet TAKE 1 TABLET BY MOUTH 2 TIMES DAILY. CAN INCREASE TO TWO TABS TWICE A DAY IF SEE HEAVY BLEEDING 60 tablet 1  . Accu-Chek FastClix Lancets MISC 1 each by Does not apply route as directed. Use as directed to check blood sugar twice daily as needed. E11.8 102 each 3  . acetaminophen (TYLENOL) 500 MG tablet Take 1,000 mg by mouth 2 (two) times daily as needed for moderate pain or headache.     Marland Kitchen amLODipine (NORVASC) 5 MG tablet TAKE 1 TABLET BY  MOUTH EVERY DAY 90 tablet 3  . BIKTARVY 50-200-25 MG TABS tablet Take 1 tablet by mouth at bedtime. 30 tablet 5  . citalopram (CELEXA) 20 MG tablet TAKE 1 TABLET BY MOUTH EVERY DAY 90 tablet 3  . colestipol (COLESTID) 1 g tablet TAKE 2 TABLETS (2 G TOTAL) BY MOUTH 2 (TWO) TIMES DAILY. 360 tablet 0  . cyclobenzaprine (FLEXERIL) 5 MG tablet Take 1 tablet (5 mg total) by mouth 2 (two) times daily as needed (headache (sedation precautions)). 30 tablet 3  . glucose blood (ACCU-CHEK GUIDE) test strip 1 each by Other route as needed for other. Use as instructed to check blood sugar 100 each 0  . hydrochlorothiazide (HYDRODIURIL) 25 MG tablet TAKE 1 TABLET DAILY 90  tablet 3  . lisinopril (ZESTRIL) 10 MG tablet TAKE 1 TABLET BY MOUTH EVERY DAY 90 tablet 3  . Multiple Vitamin (MULTIVITAMIN WITH MINERALS) TABS tablet Take 1 tablet by mouth at bedtime.    . pantoprazole (PROTONIX) 40 MG tablet TAKE 1 TABLET BY MOUTH TWICE A DAY 180 tablet 0  . Peppermint Oil (IBGARD PO) Take 2 tablets by mouth daily.    . Semaglutide, 1 MG/DOSE, (OZEMPIC, 1 MG/DOSE,) 4 MG/3ML SOPN Inject 0.75 mLs (1 mg total) into the skin once a week. 9 mL 3  . topiramate (TOPAMAX) 25 MG tablet TAKE 1 TABLET BY MOUTH EVERYDAY AT BEDTIME 90 tablet 2   No current facility-administered medications for this visit.    REVIEW OF SYSTEMS:  Const: negative fever, negative chills, 25 pound weight loss Eyes: negative diplopia or visual changes, negative eye pain ENT: negative coryza, negative sore throat Resp: negative cough, hemoptysis, dyspnea Cards: negative for chest pain, palpitations, lower extremity edema GU: negative for frequency, dysuria and hematuria Skin: negative for rash and pruritus Heme: negative for easy bruising and gum/nose bleeding MS: negative for myalgias, arthralgias, back pain and muscle weakness Neurolo:headaches much better dizziness, NO vertigo, memory problems  Psych:stress   Objective:  VITALS:  BP 129/70   Pulse (!) 110   Temp 98.7 F (37.1 C)   Resp 16   Ht 5\' 8"  (1.727 m)   Wt (!) 311 lb 3.2 oz (141.2 kg)   SpO2 98%   BMI 47.32 kg/m   Weight today is 306 PHYSICAL EXAM:  General:well Lungs: Clear to auscultation bilaterally. No Wheezing or Rhonchi. No rales. Heart: Regular rate and rhythm, no murmur, rub or gallop. Abdomen: Soft, non-tender,not distended. Bowel sounds normal. No masses Extremities: Extremities normal, atraumatic, no cyanosis. No edema. No clubbing Skin: No rashes or lesions. Not Jaundiced Lymph: Cervical, supraclavicular normal. Neurologic: Grossly non-focal Pertinent Labs Health maintenance:  Vaccination  Vaccine Date last  given comment  Influenza 05/18/19   Hepatitis B 3/10, 5/19 and 02/16/19   Hepatitis A 08/16/18 and 02/16/19   Prevnar-PCV-13    Pneumovac-PPSV-23 01/04/15   TdaP 12/14/12   HPV    Shingrix ( zoster vaccine)     ______________________  Labs Lab Result  Date comment  HIV VL  <20    CD4 1263 03/2020   Genotype     [id number] Neg 08/16/2018   HIV antibody     RPR  nonreactive 11/16/19   Quantiferon Gold  negative 11/16/19   Hep C ab Neg 03/30/18   Hepatitis B-ab,ag,c Sag-Neg, Sab neg Cab neg 03/30/18   Hepatitis A-IgM, IgG /T Neg 03/30/18   Lipid TC 170, HDL 41, LDL 108 TGL 99 11/26/17   GC/CHL neg 01/25/20  PAP High risk HPV + 01/25/20          Preventive  Procedure Result  Date comment  colonoscopy Tubular adenoma 07/03/18   Mammogram Normal 11/26/17   Dental exam     Opthal       Impression/Recommendation 44 y.o. female with a history of HIV, hypertension               ? ?HIV under good control.  On Biktarvy.  100% adherent.  Viral load less than 20 and CD4 is 1263  30 pound weight loss since starting ozempic ( semaglutide  HTN better controlled- now on  triple therapy- amlodipine+ HCTZ= ACEI  DM started on ozempic   B12 deficiency on supplement ( she has been on long term PPI which can cause it)  Diabetes mellitus started on metformin  Health maintenance updated-  Partner notification done     Corona virus vaccine  -pt is getting moderna today- 1st dose 2nd dose day would be after  her hysterectomy ( scheduled for 06/18/20  So will give 2nd dose 1-2 weeks later-   ?Discussed the management with patient - follow up 6 months

## 2020-05-21 NOTE — Progress Notes (Signed)
   Covid-19 Vaccination Clinic  Name:  Cassandra [name]    MRN: [id number] DOB: 08-01-1975  05/21/2020  [name] was observed post Covid-19 immunization for 15 minutes without incident. She was provided with Vaccine Information Sheet and instruction to access the V-Safe system.   [name] was instructed to call 911 with any severe reactions post vaccine: Marland Kitchen Difficulty breathing  . Swelling of face and throat  . A fast heartbeat  . A bad rash all over body  . Dizziness and weakness

## 2020-05-30 ENCOUNTER — Telehealth: Payer: Self-pay | Admitting: Infectious Diseases

## 2020-05-30 ENCOUNTER — Other Ambulatory Visit: Payer: Self-pay

## 2020-05-30 DIAGNOSIS — B20 Human immunodeficiency virus [HIV] disease: Secondary | ICD-10-CM

## 2020-05-30 MED ORDER — BIKTARVY 50-200-25 MG PO TABS: 1.0000 | ORAL_TABLET | Freq: Every day | ORAL | 5 refills | Status: DC

## 2020-05-30 NOTE — Telephone Encounter (Signed)
Pt stated she was having trouble getting refill of Biktarvy from pharmacy and was told to contact the office. Would like a call back.

## 2020-05-30 NOTE — Telephone Encounter (Signed)
LM with patient to call me on cell over holiday if refill issues as I sent refills to [facility].

## 2020-06-04 ENCOUNTER — Other Ambulatory Visit: Payer: Self-pay

## 2020-06-04 ENCOUNTER — Ambulatory Visit (INDEPENDENT_AMBULATORY_CARE_PROVIDER_SITE_OTHER): Payer: BC Managed Care – PPO

## 2020-06-04 DIAGNOSIS — E538 Deficiency of other specified B group vitamins: Secondary | ICD-10-CM

## 2020-06-04 MED ORDER — CYANOCOBALAMIN 1000 MCG/ML IJ SOLN
1000.0000 ug | Freq: Once | INTRAMUSCULAR | Status: AC
  Administered 2020-06-04: 16:00:00 1000 ug via INTRAMUSCULAR

## 2020-06-04 NOTE — Progress Notes (Signed)
Per orders of Dr. Javier Gutierrez, injection of B-12 given by Jaeden Messer Y Messina Kosinski in right deltoid. Patient tolerated injection well. Patient will make appointment for 1 month.    

## 2020-06-08 HISTORY — PX: TOE SURGERY: SHX1073

## 2020-06-11 ENCOUNTER — Encounter: Payer: Self-pay | Admitting: Family Medicine

## 2020-06-11 ENCOUNTER — Other Ambulatory Visit: Payer: Self-pay | Admitting: Obstetrics and Gynecology

## 2020-06-13 NOTE — Progress Notes (Signed)
[id number] [place], [place] - [address] [place] [address] Phone: [phone] Fax: [phone]  [facility] - [place], [place] - [address] [facility] [address] [place] [address] Phone: [phone] Fax: [phone]  [facility] [place] [place] - [address] [place] [address] Phone: [phone] Fax: [phone]      Your procedure is scheduled on 06/18/2020.  Report to [facility] Main Entrance "A" at  A.M., and check in at the Admitting office.  Call this number if you have problems the morning of surgery:  [phone]  Call [phone] if you have any questions prior to your surgery date Monday-Friday 8am-4pm    Remember:  Do not eat or drink after midnight the night before your surgery    Take these medicines the morning of surgery with A SIP OF WATER  acetaminophen (TYLENOL) - if needed amLODipine (NORVASC) citalopram (CELEXA)  colestipol (COLESTID) cyclobenzaprine (FLEXERIL) loratadine (CLARITIN) pantoprazole (PROTONIX)    As of today, STOP taking any Aspirin (unless otherwise instructed by your surgeon) Aleve, Naproxen, Ibuprofen, Motrin, Advil, Goody's, BC's, all herbal medications, fish oil, and all vitamins.                      Do not wear jewelry, make up, or nail polish            Do not wear lotions, powders, perfumes/colognes, or deodorant.            Do not shave 48 hours prior to surgery.  Men may shave face and neck.            Do not bring valuables to the hospital.            [facility] is not responsible for any belongings or valuables.  Do NOT Smoke (Tobacco/Vaping) or drink Alcohol 24 hours prior to your procedure If you use a CPAP at night, you may bring all equipment for your overnight stay.   Contacts, glasses, dentures or bridgework may not be worn into surgery.      For patients admitted to the hospital,  discharge time will be determined by your treatment team.   Patients discharged the day of surgery will not be allowed to drive home, and someone needs to stay with them for 24 hours.    Special instructions:   [facility]- Preparing For Surgery  Before surgery, you can play an important role. Because skin is not sterile, your skin needs to be as free of germs as possible. You can reduce the number of germs on your skin by washing with CHG (chlorahexidine gluconate) Soap before surgery.  CHG is an antiseptic cleaner which kills germs and bonds with the skin to continue killing germs even after washing.    Oral Hygiene is also important to reduce your risk of infection.  Remember - BRUSH YOUR TEETH THE MORNING OF SURGERY WITH YOUR REGULAR TOOTHPASTE  Please do not use if you have an allergy to CHG or antibacterial soaps. If your skin becomes reddened/irritated stop using the CHG.  Do not shave (including legs and underarms) for at least 48 hours prior to first CHG shower. It is OK to shave your face.  Please follow these instructions carefully.   1. Shower the NIGHT BEFORE SURGERY and the MORNING OF SURGERY with CHG Soap.   2. If you chose to wash your  hair, wash your hair first as usual with your normal shampoo.  3. After you shampoo, rinse your hair and body thoroughly to remove the shampoo.  4. Use CHG as you would any other liquid soap. You can apply CHG directly to the skin and wash gently with a scrungie or a clean washcloth.   5. Apply the CHG Soap to your body ONLY FROM THE NECK DOWN.  Do not use on open wounds or open sores. Avoid contact with your eyes, ears, mouth and genitals (private parts). Wash Face and genitals (private parts)  with your normal soap.   6. Wash thoroughly, paying special attention to the area where your surgery will be performed.  7. Thoroughly rinse your body with warm water from the neck down.  8. DO NOT shower/wash with your normal soap after using  and rinsing off the CHG Soap.  9. Pat yourself dry with a CLEAN TOWEL.  10. Wear CLEAN PAJAMAS to bed the night before surgery  11. Place CLEAN SHEETS on your bed the night of your first shower and DO NOT SLEEP WITH PETS.   Day of Surgery: Wear Clean/Comfortable clothing the morning of surgery Do not apply any deodorants/lotions.   Remember to brush your teeth WITH YOUR REGULAR TOOTHPASTE.   Please read over the following fact sheets that you were given.

## 2020-06-14 ENCOUNTER — Inpatient Hospital Stay (HOSPITAL_COMMUNITY)
Admission: RE | Admit: 2020-06-14 | Discharge: 2020-06-14 | Disposition: A | Discharge status: Home / Self Care | Payer: BC Managed Care – PPO | Source: Ambulatory Visit

## 2020-06-14 ENCOUNTER — Other Ambulatory Visit (HOSPITAL_COMMUNITY): Payer: BC Managed Care – PPO

## 2020-06-16 ENCOUNTER — Other Ambulatory Visit: Payer: Self-pay | Admitting: Obstetrics and Gynecology

## 2020-06-16 MED ORDER — MEGESTROL ACETATE 40 MG PO TABS
80.0000 mg | ORAL_TABLET | Freq: Every day | ORAL | 0 refills | Status: DC
Start: 2020-06-16 — End: 2020-08-22

## 2020-06-17 ENCOUNTER — Other Ambulatory Visit: Payer: Self-pay | Admitting: Gastroenterology

## 2020-06-17 DIAGNOSIS — K529 Noninfective gastroenteritis and colitis, unspecified: Secondary | ICD-10-CM

## 2020-06-20 ENCOUNTER — Ambulatory Visit: Payer: BC Managed Care – PPO

## 2020-06-25 ENCOUNTER — Ambulatory Visit: Payer: BC Managed Care – PPO

## 2020-06-27 ENCOUNTER — Ambulatory Visit: Payer: BC Managed Care – PPO | Attending: Infectious Diseases | Admitting: Infectious Diseases

## 2020-06-27 ENCOUNTER — Ambulatory Visit: Payer: BC Managed Care – PPO

## 2020-06-27 ENCOUNTER — Other Ambulatory Visit: Payer: Self-pay

## 2020-06-27 DIAGNOSIS — Z23 Encounter for immunization: Secondary | ICD-10-CM

## 2020-06-27 NOTE — Progress Notes (Signed)
   Covid-19 Vaccination Clinic  Name:  Cassandra [name]    MRN: [id number] DOB: Aug 24, 1975  06/27/2020  [name] was observed post Covid-19 immunization for 15 minutes without incident. She was provided with Vaccine Information Sheet and instruction to access the V-Safe system.   [name] was instructed to call 911 with any severe reactions post vaccine: Marland Kitchen Difficulty breathing  . Swelling of face and throat  . A fast heartbeat  . A bad rash all over body  . Dizziness and weakness     jh

## 2020-06-27 NOTE — Progress Notes (Deleted)
   Covid-19 Vaccination Clinic  Name:  Cassandra [name]    MRN: [id number] DOB: Oct 22, 1975  06/27/2020  Cassandra [name] was observed post Covid-19 immunization for 15 minutes without incident. She was provided with Vaccine Information Sheet and instruction to access the V-Safe system.   Cassandra [name] was instructed to call 911 with any severe reactions post vaccine: Marland Kitchen Difficulty breathing  . Swelling of face and throat  . A fast heartbeat  . A bad rash all over body  . Dizziness and weakness     'jamie holden cma

## 2020-07-03 ENCOUNTER — Other Ambulatory Visit: Payer: Self-pay | Admitting: Family Medicine

## 2020-07-03 ENCOUNTER — Other Ambulatory Visit: Payer: Self-pay | Admitting: Gastroenterology

## 2020-07-04 ENCOUNTER — Telehealth: Payer: Self-pay

## 2020-07-04 NOTE — Telephone Encounter (Signed)
Flexeril Last filled:  09/20/19, #30 Last OV:  04/02/20, pre-op eval Next OV:  07/23/20, 3 mo DM f/u

## 2020-07-04 NOTE — Telephone Encounter (Signed)
Needs annual follow-up prior to additional refills. Thanks.

## 2020-07-04 NOTE — Telephone Encounter (Signed)
Following message received from Dr. Tarri Glenn:  Needs annual follow-up prior to additional refills. Thanks.

## 2020-07-12 ENCOUNTER — Encounter (HOSPITAL_COMMUNITY): Payer: Self-pay | Admitting: Obstetrics and Gynecology

## 2020-07-12 ENCOUNTER — Other Ambulatory Visit: Payer: Self-pay

## 2020-07-12 ENCOUNTER — Other Ambulatory Visit (HOSPITAL_COMMUNITY)
Admission: RE | Admit: 2020-07-12 | Discharge: 2020-07-12 | Disposition: A | Discharge status: Home / Self Care | Payer: BC Managed Care – PPO | Source: Ambulatory Visit | Attending: Obstetrics and Gynecology | Admitting: Obstetrics and Gynecology

## 2020-07-12 DIAGNOSIS — U071 COVID-19: Secondary | ICD-10-CM

## 2020-07-12 HISTORY — DX: COVID-19: U07.1

## 2020-07-12 LAB — SARS CORONAVIRUS 2 (TAT 6-24 HRS): SARS Coronavirus 2: POSITIVE — AB

## 2020-07-12 NOTE — Progress Notes (Signed)
Cassandra [name] denies chest pain or shortness of breath. Patient denies having any s/s of Covid in her household.  Patient denies any known exposure to Covid. Cassandra [name] was tested for Covid today and is aware she is in quarantine with her family.  Cassandra [name] has type II diabetes. Patient states that fasting CBG's run 80- 100, hs CBG runs 150 range.I instructed patient to check CBG after awaking and every 2 hours until arrival  to the hospital.  I Instructed patient if CBG is less than 70 to drink  1/2 cup of a clear juice. Recheck CBG in 15 minutes.

## 2020-07-13 ENCOUNTER — Other Ambulatory Visit: Payer: Self-pay | Admitting: Obstetrics and Gynecology

## 2020-07-13 ENCOUNTER — Telehealth: Payer: Self-pay | Admitting: Family

## 2020-07-13 ENCOUNTER — Telehealth: Payer: Self-pay | Admitting: Obstetrics and Gynecology

## 2020-07-13 DIAGNOSIS — U071 COVID-19: Secondary | ICD-10-CM | POA: Insufficient documentation

## 2020-07-13 NOTE — Progress Notes (Signed)
Called Cassandra [name] from the on-call service for Dr. Sheryn Bison with + covid results for this pt. The pt is to have surgery on Tues 2/8 at [facility] at [facility]. The pt has not been notified as there will be pertinent questions the provider needs to answer.  These are the current guidelines:  Positive Results for:  Asymptomatic: Procedure postponed and quarantined for 10 days, unless the procedure is urgent. Symptomatic: Procedure postponed and quarantined for 14 days. Hospitalized with Covid: postponed and quarantined  for 21 days. Immunocompromised: Procedure postponed and quarantine for 20 days.  The pt will not be retested for 90 days from the + result.

## 2020-07-13 NOTE — Telephone Encounter (Signed)
GYN Telephone Note  Patient called re: 07/12/20 + covid pre admit screening testing. She is completely asymptomatic; she received her 2nd covid shot on 06/27/20.    I told her I'd touch base with her on Monday after talking to the OR, but I'm guessing they're going to recommend delaying her surgery at least 10 days from her + test.  ER precautions given and I told her I'd contact the infusion center to see if she's a candidate for monoclonal antibodies. PCP CC'ed on this message just to let them know about her + test.   Durene Romans MD Attending Center for [place] (Faculty Practice) 07/13/2020 Time: 1046am

## 2020-07-13 NOTE — Telephone Encounter (Signed)
Called to discuss with patient about COVID-19 symptoms and the use of one of the available treatments for those with mild to moderate Covid symptoms and at a high risk of hospitalization.  Pt appears to qualify for outpatient treatment due to co-morbid conditions and/or a member of an at-risk group in accordance with the FDA Emergency Use Authorization.    Symptom onset: Past couple of weeks  Feeling congestion and having some sinus  Vaccinated: Yes Booster? Not eligible yet Immunocompromised? No Qualifiers: BMI >25, hypertension, HIV  Cassandra [name] tested positive during pre-operative clearance testing and is currently experiencing sinus like symptoms. Since symptoms started a couple of weeks ago she is outside the current window for treatment. Post-Covid Care clinic information sent to MyChart per her request.  Terri Piedra, NP 07/13/2020 10:26 AM

## 2020-07-15 ENCOUNTER — Other Ambulatory Visit: Payer: Self-pay | Admitting: Obstetrics and Gynecology

## 2020-07-16 ENCOUNTER — Encounter: Payer: Self-pay | Admitting: Family Medicine

## 2020-07-17 NOTE — Telephone Encounter (Signed)
Spoke with pt asking if she indeed is asx.  States she no longer has sxs.    I explained, per [facility] policy, she would need to be 10 days from pos COVID test.  Since hers was 07/12/20, the OV on 07/23/20 will be day 10 so she is able to keep appt.  Pt verbalizes understanding.

## 2020-07-23 ENCOUNTER — Encounter: Payer: Self-pay | Admitting: Family Medicine

## 2020-07-23 ENCOUNTER — Ambulatory Visit (INDEPENDENT_AMBULATORY_CARE_PROVIDER_SITE_OTHER): Payer: BC Managed Care – PPO | Admitting: Family Medicine

## 2020-07-23 ENCOUNTER — Other Ambulatory Visit: Payer: Self-pay

## 2020-07-23 VITALS — BP 120/80 | HR 98 | Temp 97.7°F | Ht 68.0 in | Wt 297.4 lb

## 2020-07-23 DIAGNOSIS — Z21 Asymptomatic human immunodeficiency virus [HIV] infection status: Secondary | ICD-10-CM

## 2020-07-23 DIAGNOSIS — B20 Human immunodeficiency virus [HIV] disease: Secondary | ICD-10-CM | POA: Diagnosis not present

## 2020-07-23 DIAGNOSIS — N920 Excessive and frequent menstruation with regular cycle: Secondary | ICD-10-CM

## 2020-07-23 DIAGNOSIS — IMO0002 Reserved for concepts with insufficient information to code with codable children: Secondary | ICD-10-CM

## 2020-07-23 DIAGNOSIS — E118 Type 2 diabetes mellitus with unspecified complications: Secondary | ICD-10-CM

## 2020-07-23 DIAGNOSIS — E1165 Type 2 diabetes mellitus with hyperglycemia: Secondary | ICD-10-CM

## 2020-07-23 DIAGNOSIS — I10 Essential (primary) hypertension: Secondary | ICD-10-CM

## 2020-07-23 DIAGNOSIS — Z6841 Body Mass Index (BMI) 40.0 and over, adult: Secondary | ICD-10-CM

## 2020-07-23 LAB — POCT GLYCOSYLATED HEMOGLOBIN (HGB A1C): Hemoglobin A1C: 6.8 % — AB (ref 4.0–5.6)

## 2020-07-23 MED ORDER — SITAGLIPTIN PHOSPHATE 50 MG PO TABS: 50.0000 mg | ORAL_TABLET | Freq: Every day | ORAL | 3 refills | Status: DC

## 2020-07-23 NOTE — Assessment & Plan Note (Signed)
Great control based on recall cbg's and latest A1c.  She has come off ozempic due to cost until she meets deductible (likely next month after hysterectomy). Will start januvia 50mg  in interim. Anticipate will do well.  Discussed need to taper again once restarted.

## 2020-07-23 NOTE — Progress Notes (Signed)
Patient ID: Cassandra [name], female    DOB: [birth year], 45 y.o.   MRN: [id number]  This visit was conducted in person.  BP 120/80   Pulse 98   Temp 97.7 F (36.5 C) (Temporal)   Ht _0  (1.727 m)   Wt 297 lb 6 oz (134.9 kg)   LMP 07/10/2020   SpO2 97%   BMI 45.22 kg/m    CC: DM f/u visit  Subjective:   HPI: Cassandra [name] is a 45 y.o. female presenting on 07/23/2020 for Diabetes (Here for 3 mo f/u.  Has been off Ozempic about 1 wk due to cost.  States cost should decrease once deductible met. )   Another 15 lbs down since last visit. Lost total 56 lbs since 05/2019!   Planning hysterectomy March 15.  GERD - on PPI bid - with weight loss may try QD dosing   DM - does regularly check sugars: fasting 100, bedtime 140-160. Compliant with antihyperglycemic regimen which includes: ozempic 1 mg weekly which she had been tolerating fine without nausea, GI upset, constipation - off this for the past week due to cost $700 - planning to restart once she meets deductible. Denies low sugars or hypoglycemic symptoms. Denies paresthesias. Last diabetic eye exam DUE. Pneumovax: 2016. Prevnar: not due. Glucometer brand: accuchek. DSME: 07/2019. Lab Results  Component Value Date   HGBA1C 6.8 (A) 07/23/2020   Diabetic Foot Exam - Simple   Simple Foot Form Diabetic Foot exam was performed with the following findings: Yes 07/23/2020  9:18 AM  Visual Inspection No deformities, no ulcerations, no other skin breakdown bilaterally: Yes Sensation Testing Intact to touch and monofilament testing bilaterally: Yes Pulse Check Posterior Tibialis and Dorsalis pulse intact bilaterally: Yes Comments    Lab Results  Component Value Date   MICROALBUR 1.3 12/27/2019         Relevant past medical, surgical, family and social history reviewed and updated as indicated. Interim medical history since our last visit reviewed. Allergies and medications reviewed and updated. Outpatient Medications  Prior to Visit  Medication Sig Dispense Refill  . Accu-Chek FastClix Lancets MISC 1 each by Does not apply route as directed. Use as directed to check blood sugar twice daily as needed. E11.8 102 each 3  . acetaminophen (TYLENOL) 500 MG tablet Take 1,000 mg by mouth 2 (two) times daily as needed for moderate pain or headache.     Marland Kitchen amLODipine (NORVASC) 5 MG tablet TAKE 1 TABLET BY MOUTH EVERY DAY (Patient taking differently: Take 5 mg by mouth at bedtime.) 90 tablet 3  . BIKTARVY 50-200-25 MG TABS tablet Take 1 tablet by mouth at bedtime. 30 tablet 5  . citalopram (CELEXA) 20 MG tablet TAKE 1 TABLET BY MOUTH EVERY DAY (Patient taking differently: Take 20 mg by mouth at bedtime.) 90 tablet 3  . colestipol (COLESTID) 1 g tablet TAKE 2 TABLETS (2 G TOTAL) BY MOUTH 2 (TWO) TIMES DAILY. (Patient taking differently: Take 1 g by mouth daily.) 360 tablet 0  . cyclobenzaprine (FLEXERIL) 5 MG tablet TAKE 1 TABLET (5 MG TOTAL) BY MOUTH 2 (TWO) TIMES DAILY AS NEEDED (HEADACHE (SEDATION PRECAUTIONS)). 30 tablet 3  . diphenhydrAMINE (BENADRYL) 25 MG tablet Take 25 mg by mouth at bedtime as needed for allergies or sleep.    Marland Kitchen glucose blood (ACCU-CHEK GUIDE) test strip 1 each by Other route as needed for other. Use as instructed to check blood sugar 100 each 0  . hydrochlorothiazide (HYDRODIURIL)  25 MG tablet TAKE 1 TABLET DAILY 90 tablet 3  . lisinopril (ZESTRIL) 10 MG tablet TAKE 1 TABLET BY MOUTH EVERY DAY (Patient taking differently: Take 10 mg by mouth at bedtime.) 90 tablet 3  . loratadine (CLARITIN) 10 MG tablet Take 10 mg by mouth daily as needed for allergies.    . megestrol (MEGACE) 40 MG tablet Take 2 tablets (80 mg total) by mouth at bedtime. 60 tablet 0  . pantoprazole (PROTONIX) 40 MG tablet TAKE 1 TABLET BY MOUTH TWICE A DAY 180 tablet 0  . Peppermint Oil (IBGARD PO) Take 2 tablets by mouth daily as needed (diarrhea).    . topiramate (TOPAMAX) 25 MG tablet TAKE 1 TABLET BY MOUTH EVERYDAY AT BEDTIME  (Patient taking differently: Take 25 mg by mouth daily as needed (headaches).) 90 tablet 2  . Semaglutide, 1 MG/DOSE, (OZEMPIC, 1 MG/DOSE,) 4 MG/3ML SOPN Inject 0.75 mLs (1 mg total) into the skin once a week. (Patient not taking: Reported on 07/23/2020) 9 mL 3   No facility-administered medications prior to visit.     Per HPI unless specifically indicated in ROS section below Review of Systems Objective:  BP 120/80   Pulse 98   Temp 97.7 F (36.5 C) (Temporal)   Ht _0  (1.727 m)   Wt 297 lb 6 oz (134.9 kg)   LMP 07/10/2020   SpO2 97%   BMI 45.22 kg/m   Wt Readings from Last 3 Encounters:  07/23/20 297 lb 6 oz (134.9 kg)  05/21/20 (!) 311 lb 3.2 oz (141.2 kg)  04/02/20 (!) 311 lb 1 oz (141.1 kg)      Physical Exam Vitals and nursing note reviewed.  Constitutional:      General: She is not in acute distress.    Appearance: She is well-developed and well-nourished.  HENT:     Mouth/Throat:     Mouth: Oropharynx is clear and moist.  Eyes:     General: No scleral icterus.    Extraocular Movements: Extraocular movements intact and EOM normal.     Conjunctiva/sclera: Conjunctivae normal.     Pupils: Pupils are equal, round, and reactive to light.  Cardiovascular:     Rate and Rhythm: Normal rate and regular rhythm.     Pulses: Normal pulses and intact distal pulses.     Heart sounds: Normal heart sounds. No murmur heard.   Pulmonary:     Effort: Pulmonary effort is normal. No respiratory distress.     Breath sounds: Normal breath sounds. No wheezing, rhonchi or rales.  Musculoskeletal:        General: No edema.     Cervical back: Normal range of motion and neck supple.     Comments: See HPI for foot exam if done  Lymphadenopathy:     Cervical: No cervical adenopathy.  Skin:    General: Skin is warm and dry.     Findings: No rash.  Psychiatric:        Mood and Affect: Mood and affect and mood normal.        Behavior: Behavior normal.       Results for orders  placed or performed in visit on 07/23/20  POCT glycosylated hemoglobin (Hb A1C)  Result Value Ref Range   Hemoglobin A1C 6.8 (A) 4.0 - 5.6 %   HbA1c POC (<> result, manual entry)     HbA1c, POC (prediabetic range)     HbA1c, POC (controlled diabetic range)     Assessment & Plan:  This visit occurred during the SARS-CoV-2 public health emergency.  Safety protocols were in place, including screening questions prior to the visit, additional usage of staff PPE, and extensive cleaning of exam room while observing appropriate contact time as indicated for disinfecting solutions.   Problem List Items Addressed This Visit    Morbid obesity with BMI of 45.0-49.9, adult ([place])    Congratulated on ongoing weight loss - down 56 lbs since 05/2019! Motivated to continue healthy changes. Will stop ozempic for 1 month, will need to monitor weight off this.       Relevant Medications   sitaGLIPtin (JANUVIA) 50 MG tablet   Hypertension    Chronic, great control on current regimen.       HIV (human immunodeficiency virus infection) ([place])    Stable on treatment.       Heavy menstrual bleeding    Pending hysterectomy.       Controlled diabetes mellitus type 2 with complications ([place]) - Primary    Great control based on recall cbg's and latest A1c.  She has come off ozempic due to cost until she meets deductible (likely next month after hysterectomy). Will start januvia 90m in interim. Anticipate will do well.  Discussed need to taper again once restarted.       Relevant Medications   sitaGLIPtin (JANUVIA) 50 MG tablet       Meds ordered this encounter  Medications  . sitaGLIPtin (JANUVIA) 50 MG tablet    Sig: Take 1 tablet (50 mg total) by mouth daily.    Dispense:  30 tablet    Refill:  3   Orders Placed This Encounter  Procedures  . POCT glycosylated hemoglobin (Hb A1C)    Patient Instructions  You are doing well today! Schedule eye exam and mammogram as you're due.  Start  januvia 575mdaily while off ozempic, then we may need to restart tapering dose once you meet deductible, let me know once ready.  Return in 3 months for diabetes check   Follow up plan: Return in about 3 months (around 10/20/2020) for follow up visit.  JaRia BushMD

## 2020-07-23 NOTE — Assessment & Plan Note (Signed)
Pending hysterectomy.

## 2020-07-23 NOTE — Assessment & Plan Note (Signed)
Chronic, great control on current regimen.  

## 2020-07-23 NOTE — Assessment & Plan Note (Addendum)
Stable on treatment.

## 2020-07-23 NOTE — Assessment & Plan Note (Signed)
Congratulated on ongoing weight loss - down 56 lbs since 05/2019! Motivated to continue healthy changes. Will stop ozempic for 1 month, will need to monitor weight off this.

## 2020-07-23 NOTE — Patient Instructions (Addendum)
You are doing well today! Schedule eye exam and mammogram as you're due.  Start januvia 50mg  daily while off ozempic, then we may need to restart tapering dose once you meet deductible, let me know once ready.  Return in 3 months for diabetes check

## 2020-08-19 ENCOUNTER — Encounter (HOSPITAL_COMMUNITY): Payer: Self-pay | Admitting: Obstetrics and Gynecology

## 2020-08-19 MED ORDER — DEXTROSE 5 % IV SOLN
3.0000 g | INTRAVENOUS | Status: DC
  Filled 2020-08-19: qty 3000

## 2020-08-19 NOTE — Progress Notes (Signed)
Spoke with pt for pre-op call. Pt had been scheduled in February for this surgery but tested positive for Covid on 07/12/20. Pt states she had no symptoms of Covid. Pt denies cardiac history. Pt is treated for HTN and Diabetes. Pt's most recent A1C was 6.8 on 07/23/20. Instructed pt to hold her Januvia day of surgery. Instructed pt to check her blood sugar in the AM and every 2 hours prior to surgery.  If blood sugar is 70 or below, treat with 1/2 cup of clear juice (apple or cranberry) and recheck blood sugar 15 minutes after drinking juice. If blood sugar continues to be 70 or below, call the Short Stay department and ask to speak to a nurse.

## 2020-08-20 ENCOUNTER — Inpatient Hospital Stay (HOSPITAL_COMMUNITY): Payer: 59 | Admitting: Certified Registered Nurse Anesthetist

## 2020-08-20 ENCOUNTER — Inpatient Hospital Stay (HOSPITAL_COMMUNITY): Payer: 59 | Admitting: Physician Assistant

## 2020-08-20 ENCOUNTER — Encounter (HOSPITAL_COMMUNITY): Payer: Self-pay | Admitting: Obstetrics and Gynecology

## 2020-08-20 ENCOUNTER — Other Ambulatory Visit: Payer: Self-pay

## 2020-08-20 ENCOUNTER — Encounter (HOSPITAL_COMMUNITY)
Admission: AD | Disposition: A | Discharge status: Home / Self Care | Payer: Self-pay | Source: Home / Self Care | Attending: Obstetrics and Gynecology

## 2020-08-20 ENCOUNTER — Inpatient Hospital Stay (HOSPITAL_COMMUNITY)
Admission: AD | Admit: 2020-08-20 | Discharge: 2020-08-22 | DRG: 742 | Disposition: A | Discharge status: Home / Self Care | Payer: 59 | Attending: Obstetrics and Gynecology | Admitting: Obstetrics and Gynecology

## 2020-08-20 DIAGNOSIS — Z823 Family history of stroke: Secondary | ICD-10-CM

## 2020-08-20 DIAGNOSIS — Z808 Family history of malignant neoplasm of other organs or systems: Secondary | ICD-10-CM

## 2020-08-20 DIAGNOSIS — N870 Mild cervical dysplasia: Secondary | ICD-10-CM | POA: Diagnosis not present

## 2020-08-20 DIAGNOSIS — N9985 Post endometrial ablation syndrome: Secondary | ICD-10-CM

## 2020-08-20 DIAGNOSIS — E118 Type 2 diabetes mellitus with unspecified complications: Secondary | ICD-10-CM | POA: Diagnosis not present

## 2020-08-20 DIAGNOSIS — Z888 Allergy status to other drugs, medicaments and biological substances status: Secondary | ICD-10-CM | POA: Diagnosis not present

## 2020-08-20 DIAGNOSIS — Z5331 Laparoscopic surgical procedure converted to open procedure: Secondary | ICD-10-CM

## 2020-08-20 DIAGNOSIS — Z90710 Acquired absence of both cervix and uterus: Secondary | ICD-10-CM | POA: Diagnosis present

## 2020-08-20 DIAGNOSIS — K219 Gastro-esophageal reflux disease without esophagitis: Secondary | ICD-10-CM | POA: Diagnosis not present

## 2020-08-20 DIAGNOSIS — Z98891 History of uterine scar from previous surgery: Secondary | ICD-10-CM

## 2020-08-20 DIAGNOSIS — Z79899 Other long term (current) drug therapy: Secondary | ICD-10-CM | POA: Diagnosis not present

## 2020-08-20 DIAGNOSIS — Z6841 Body Mass Index (BMI) 40.0 and over, adult: Secondary | ICD-10-CM

## 2020-08-20 DIAGNOSIS — Z833 Family history of diabetes mellitus: Secondary | ICD-10-CM | POA: Diagnosis not present

## 2020-08-20 DIAGNOSIS — N736 Female pelvic peritoneal adhesions (postinfective): Secondary | ICD-10-CM | POA: Diagnosis present

## 2020-08-20 DIAGNOSIS — Z8249 Family history of ischemic heart disease and other diseases of the circulatory system: Secondary | ICD-10-CM

## 2020-08-20 DIAGNOSIS — D259 Leiomyoma of uterus, unspecified: Secondary | ICD-10-CM | POA: Diagnosis not present

## 2020-08-20 DIAGNOSIS — Z21 Asymptomatic human immunodeficiency virus [HIV] infection status: Secondary | ICD-10-CM | POA: Diagnosis present

## 2020-08-20 DIAGNOSIS — I10 Essential (primary) hypertension: Secondary | ICD-10-CM | POA: Diagnosis not present

## 2020-08-20 DIAGNOSIS — N939 Abnormal uterine and vaginal bleeding, unspecified: Secondary | ICD-10-CM | POA: Diagnosis not present

## 2020-08-20 DIAGNOSIS — E119 Type 2 diabetes mellitus without complications: Secondary | ICD-10-CM | POA: Diagnosis not present

## 2020-08-20 DIAGNOSIS — Z9049 Acquired absence of other specified parts of digestive tract: Secondary | ICD-10-CM

## 2020-08-20 DIAGNOSIS — Z83438 Family history of other disorder of lipoprotein metabolism and other lipidemia: Secondary | ICD-10-CM | POA: Diagnosis not present

## 2020-08-20 DIAGNOSIS — F17210 Nicotine dependence, cigarettes, uncomplicated: Secondary | ICD-10-CM | POA: Diagnosis not present

## 2020-08-20 HISTORY — PX: HYSTERECTOMY ABDOMINAL WITH SALPINGECTOMY: SHX6725

## 2020-08-20 HISTORY — DX: Unspecified osteoarthritis, unspecified site: M19.90

## 2020-08-20 HISTORY — DX: Irritable bowel syndrome, unspecified: K58.9

## 2020-08-20 HISTORY — DX: Other complications of anesthesia, initial encounter: T88.59XA

## 2020-08-20 HISTORY — PX: CYSTOSCOPY: SHX5120

## 2020-08-20 HISTORY — DX: Personal history of urinary calculi: Z87.442

## 2020-08-20 HISTORY — PX: TOTAL LAPAROSCOPIC HYSTERECTOMY WITH SALPINGECTOMY: SHX6742

## 2020-08-20 LAB — CBC
HCT: 42 % (ref 36.0–46.0)
Hemoglobin: 14.1 g/dL (ref 12.0–15.0)
MCH: 32.8 pg (ref 26.0–34.0)
MCHC: 33.6 g/dL (ref 30.0–36.0)
MCV: 97.7 fL (ref 80.0–100.0)
Platelets: 329 10*3/uL (ref 150–400)
RBC: 4.3 MIL/uL (ref 3.87–5.11)
RDW: 11.8 % (ref 11.5–15.5)
WBC: 9.2 10*3/uL (ref 4.0–10.5)
nRBC: 0 % (ref 0.0–0.2)

## 2020-08-20 LAB — COMPREHENSIVE METABOLIC PANEL
ALT: 22 U/L (ref 0–44)
AST: 24 U/L (ref 15–41)
Albumin: 3.5 g/dL (ref 3.5–5.0)
Alkaline Phosphatase: 74 U/L (ref 38–126)
Anion gap: 8 (ref 5–15)
BUN: 9 mg/dL (ref 6–20)
CO2: 25 mmol/L (ref 22–32)
Calcium: 8.9 mg/dL (ref 8.9–10.3)
Chloride: 106 mmol/L (ref 98–111)
Creatinine, Ser: 1.17 mg/dL — ABNORMAL HIGH (ref 0.44–1.00)
GFR, Estimated: 59 mL/min — ABNORMAL LOW (ref >60)
Glucose, Bld: 147 mg/dL — ABNORMAL HIGH (ref 70–99)
Potassium: 3.7 mmol/L (ref 3.5–5.1)
Sodium: 139 mmol/L (ref 135–145)
Total Bilirubin: 1.2 mg/dL (ref 0.3–1.2)
Total Protein: 7.1 g/dL (ref 6.5–8.1)

## 2020-08-20 LAB — POCT PREGNANCY, URINE: Preg Test, Ur: NEGATIVE

## 2020-08-20 LAB — GLUCOSE, CAPILLARY
Glucose-Capillary: 129 mg/dL — ABNORMAL HIGH (ref 70–99)
Glucose-Capillary: 160 mg/dL — ABNORMAL HIGH (ref 70–99)
Glucose-Capillary: 189 mg/dL — ABNORMAL HIGH (ref 70–99)
Glucose-Capillary: 205 mg/dL — ABNORMAL HIGH (ref 70–99)
Glucose-Capillary: 242 mg/dL — ABNORMAL HIGH (ref 70–99)

## 2020-08-20 LAB — HEMOGLOBIN A1C
Hgb A1c MFr Bld: 7.1 % — ABNORMAL HIGH (ref 4.8–5.6)
Mean Plasma Glucose: 157.07 mg/dL

## 2020-08-20 LAB — TYPE AND SCREEN
ABO/RH(D): A POS
Antibody Screen: NEGATIVE

## 2020-08-20 LAB — ABO/RH: ABO/RH(D): A POS

## 2020-08-20 LAB — HEMOGLOBIN AND HEMATOCRIT, BLOOD
HCT: 33.7 % — ABNORMAL LOW (ref 36.0–46.0)
Hemoglobin: 11.5 g/dL — ABNORMAL LOW (ref 12.0–15.0)

## 2020-08-20 SURGERY — HYSTERECTOMY, TOTAL, ABDOMINAL, WITH SALPINGECTOMY
Anesthesia: General

## 2020-08-20 MED ORDER — PROPOFOL 10 MG/ML IV BOLUS
INTRAVENOUS | Status: DC | PRN
  Administered 2020-08-20: 200 mg via INTRAVENOUS

## 2020-08-20 MED ORDER — LACTATED RINGERS IV SOLN: INTRAVENOUS | Status: DC

## 2020-08-20 MED ORDER — ROCURONIUM BROMIDE 10 MG/ML (PF) SYRINGE
PREFILLED_SYRINGE | INTRAVENOUS | Status: AC
  Filled 2020-08-20: qty 10

## 2020-08-20 MED ORDER — FENTANYL CITRATE (PF) 100 MCG/2ML IJ SOLN
INTRAMUSCULAR | Status: AC
  Filled 2020-08-20: qty 2

## 2020-08-20 MED ORDER — ALBUMIN HUMAN 5 % IV SOLN
INTRAVENOUS | Status: AC
  Filled 2020-08-20: qty 250

## 2020-08-20 MED ORDER — SIMETHICONE 80 MG PO CHEW
80.0000 mg | CHEWABLE_TABLET | Freq: Three times a day (TID) | ORAL | Status: DC
  Administered 2020-08-20 – 2020-08-22 (×5): 80 mg via ORAL
  Filled 2020-08-20 (×6): qty 1

## 2020-08-20 MED ORDER — OXYCODONE HCL 5 MG/5ML PO SOLN: 5.0000 mg | Freq: Once | ORAL | Status: DC | PRN

## 2020-08-20 MED ORDER — POLYETHYLENE GLYCOL 3350 17 G PO PACK
17.0000 g | PACK | Freq: Every day | ORAL | Status: DC
  Administered 2020-08-21 – 2020-08-22 (×2): 17 g via ORAL
  Filled 2020-08-20 (×3): qty 1

## 2020-08-20 MED ORDER — POVIDONE-IODINE 10 % EX SWAB
2.0000 "application " | Freq: Once | CUTANEOUS | Status: AC
  Administered 2020-08-20: 2 via TOPICAL

## 2020-08-20 MED ORDER — SODIUM CHLORIDE 0.9 % IV SOLN
INTRAVENOUS | Status: AC | PRN
  Administered 2020-08-20: 1000 mL via INTRAMUSCULAR

## 2020-08-20 MED ORDER — ONDANSETRON HCL 4 MG/2ML IJ SOLN: 4.0000 mg | Freq: Once | INTRAMUSCULAR | Status: DC | PRN

## 2020-08-20 MED ORDER — ALBUTEROL SULFATE HFA 108 (90 BASE) MCG/ACT IN AERS
INHALATION_SPRAY | RESPIRATORY_TRACT | Status: DC | PRN
  Administered 2020-08-20 (×2): 4 via RESPIRATORY_TRACT

## 2020-08-20 MED ORDER — LISINOPRIL 10 MG PO TABS
10.0000 mg | ORAL_TABLET | Freq: Every day | ORAL | Status: DC
  Administered 2020-08-21: 10 mg via ORAL
  Filled 2020-08-20: qty 1

## 2020-08-20 MED ORDER — DEXAMETHASONE SODIUM PHOSPHATE 10 MG/ML IJ SOLN
INTRAMUSCULAR | Status: AC
  Filled 2020-08-20: qty 1

## 2020-08-20 MED ORDER — FENTANYL CITRATE (PF) 250 MCG/5ML IJ SOLN
INTRAMUSCULAR | Status: AC
  Filled 2020-08-20: qty 5

## 2020-08-20 MED ORDER — PHENYLEPHRINE HCL (PRESSORS) 10 MG/ML IV SOLN
INTRAVENOUS | Status: AC
  Filled 2020-08-20: qty 1

## 2020-08-20 MED ORDER — HYDROMORPHONE 1 MG/ML IV SOLN
INTRAVENOUS | Status: AC
  Filled 2020-08-20: qty 30

## 2020-08-20 MED ORDER — INSULIN ASPART 100 UNIT/ML ~~LOC~~ SOLN
0.0000 [IU] | SUBCUTANEOUS | Status: DC
  Administered 2020-08-20: 4 [IU] via SUBCUTANEOUS
  Administered 2020-08-20: 7 [IU] via SUBCUTANEOUS
  Administered 2020-08-21: 4 [IU] via SUBCUTANEOUS

## 2020-08-20 MED ORDER — SUGAMMADEX SODIUM 200 MG/2ML IV SOLN
INTRAVENOUS | Status: DC | PRN
  Administered 2020-08-20: 300 mg via INTRAVENOUS

## 2020-08-20 MED ORDER — MIDAZOLAM HCL 2 MG/2ML IJ SOLN
INTRAMUSCULAR | Status: AC
  Filled 2020-08-20: qty 2

## 2020-08-20 MED ORDER — OXYCODONE HCL 5 MG PO TABS: 5.0000 mg | ORAL_TABLET | Freq: Once | ORAL | Status: DC | PRN

## 2020-08-20 MED ORDER — DIPHENHYDRAMINE HCL 12.5 MG/5ML PO ELIX: 12.5000 mg | ORAL_SOLUTION | Freq: Four times a day (QID) | ORAL | Status: DC | PRN

## 2020-08-20 MED ORDER — PHENYLEPHRINE 40 MCG/ML (10ML) SYRINGE FOR IV PUSH (FOR BLOOD PRESSURE SUPPORT)
PREFILLED_SYRINGE | INTRAVENOUS | Status: AC
  Filled 2020-08-20: qty 10

## 2020-08-20 MED ORDER — FENTANYL CITRATE (PF) 100 MCG/2ML IJ SOLN
25.0000 ug | INTRAMUSCULAR | Status: DC | PRN
  Administered 2020-08-20: 25 ug via INTRAVENOUS

## 2020-08-20 MED ORDER — EPHEDRINE 5 MG/ML INJ
INTRAVENOUS | Status: AC
  Filled 2020-08-20: qty 10

## 2020-08-20 MED ORDER — ORAL CARE MOUTH RINSE: 15.0000 mL | Freq: Once | OROMUCOSAL | Status: AC

## 2020-08-20 MED ORDER — DEXTROSE 5 % IV SOLN
INTRAVENOUS | Status: DC | PRN
  Administered 2020-08-20: 3 g via INTRAVENOUS

## 2020-08-20 MED ORDER — SODIUM CHLORIDE 0.9% FLUSH: 9.0000 mL | INTRAVENOUS | Status: DC | PRN

## 2020-08-20 MED ORDER — COLESTIPOL HCL 1 G PO TABS
1.0000 g | ORAL_TABLET | Freq: Every day | ORAL | Status: DC
  Administered 2020-08-21 – 2020-08-22 (×2): 1 g via ORAL
  Filled 2020-08-20 (×2): qty 1

## 2020-08-20 MED ORDER — ROCURONIUM BROMIDE 10 MG/ML (PF) SYRINGE
PREFILLED_SYRINGE | INTRAVENOUS | Status: DC | PRN
  Administered 2020-08-20: 70 mg via INTRAVENOUS
  Administered 2020-08-20: 20 mg via INTRAVENOUS
  Administered 2020-08-20: 30 mg via INTRAVENOUS

## 2020-08-20 MED ORDER — LACTATED RINGERS IV SOLN: INTRAVENOUS | Status: AC

## 2020-08-20 MED ORDER — 0.9 % SODIUM CHLORIDE (POUR BTL) OPTIME
TOPICAL | Status: DC | PRN
  Administered 2020-08-20: 1000 mL

## 2020-08-20 MED ORDER — BUPIVACAINE HCL (PF) 0.5 % IJ SOLN
INTRAMUSCULAR | Status: AC
  Filled 2020-08-20: qty 30

## 2020-08-20 MED ORDER — PANTOPRAZOLE SODIUM 40 MG IV SOLR
40.0000 mg | Freq: Every day | INTRAVENOUS | Status: DC
  Administered 2020-08-20: 40 mg via INTRAVENOUS
  Filled 2020-08-20: qty 40

## 2020-08-20 MED ORDER — HYDROMORPHONE 1 MG/ML IV SOLN
INTRAVENOUS | Status: DC
  Administered 2020-08-20: 0.9 mg via INTRAVENOUS
  Administered 2020-08-20: 30 mg via INTRAVENOUS

## 2020-08-20 MED ORDER — BUPIVACAINE HCL 0.5 % IJ SOLN
INTRAMUSCULAR | Status: DC | PRN
  Administered 2020-08-20: 4 mL

## 2020-08-20 MED ORDER — ALBUMIN HUMAN 5 % IV SOLN: INTRAVENOUS | Status: DC | PRN

## 2020-08-20 MED ORDER — AMLODIPINE BESYLATE 5 MG PO TABS
5.0000 mg | ORAL_TABLET | Freq: Every day | ORAL | Status: DC
  Administered 2020-08-21 – 2020-08-22 (×2): 5 mg via ORAL
  Filled 2020-08-20 (×2): qty 1

## 2020-08-20 MED ORDER — CHLORHEXIDINE GLUCONATE 0.12 % MT SOLN
15.0000 mL | Freq: Once | OROMUCOSAL | Status: AC
  Administered 2020-08-20: 15 mL via OROMUCOSAL

## 2020-08-20 MED ORDER — PHENYLEPHRINE HCL-NACL 10-0.9 MG/250ML-% IV SOLN
INTRAVENOUS | Status: DC | PRN
  Administered 2020-08-20: 85 ug/min via INTRAVENOUS
  Administered 2020-08-20: 25 ug/min via INTRAVENOUS

## 2020-08-20 MED ORDER — ALBUMIN HUMAN 5 % IV SOLN
12.5000 g | Freq: Once | INTRAVENOUS | Status: AC
  Administered 2020-08-20: 12.5 g via INTRAVENOUS

## 2020-08-20 MED ORDER — FENTANYL CITRATE (PF) 250 MCG/5ML IJ SOLN
INTRAMUSCULAR | Status: DC | PRN
  Administered 2020-08-20 (×6): 50 ug via INTRAVENOUS

## 2020-08-20 MED ORDER — ONDANSETRON HCL 4 MG/2ML IJ SOLN
INTRAMUSCULAR | Status: DC | PRN
  Administered 2020-08-20: 4 mg via INTRAVENOUS

## 2020-08-20 MED ORDER — ONDANSETRON HCL 4 MG/2ML IJ SOLN
INTRAMUSCULAR | Status: AC
  Filled 2020-08-20: qty 2

## 2020-08-20 MED ORDER — BICTEGRAVIR-EMTRICITAB-TENOFOV 50-200-25 MG PO TABS
1.0000 | ORAL_TABLET | Freq: Every day | ORAL | Status: DC
  Administered 2020-08-21: 1 via ORAL
  Filled 2020-08-20 (×2): qty 1

## 2020-08-20 MED ORDER — AMISULPRIDE (ANTIEMETIC) 5 MG/2ML IV SOLN: 10.0000 mg | Freq: Once | INTRAVENOUS | Status: DC | PRN

## 2020-08-20 MED ORDER — PROPOFOL 10 MG/ML IV BOLUS
INTRAVENOUS | Status: AC
  Filled 2020-08-20: qty 20

## 2020-08-20 MED ORDER — EPHEDRINE SULFATE-NACL 50-0.9 MG/10ML-% IV SOSY
PREFILLED_SYRINGE | INTRAVENOUS | Status: DC | PRN
  Administered 2020-08-20 (×6): 10 mg via INTRAVENOUS

## 2020-08-20 MED ORDER — LIDOCAINE 2% (20 MG/ML) 5 ML SYRINGE
INTRAMUSCULAR | Status: AC
  Filled 2020-08-20: qty 5

## 2020-08-20 MED ORDER — DIPHENHYDRAMINE HCL 50 MG/ML IJ SOLN: 12.5000 mg | Freq: Four times a day (QID) | INTRAMUSCULAR | Status: DC | PRN

## 2020-08-20 MED ORDER — ONDANSETRON HCL 4 MG/2ML IJ SOLN: 4.0000 mg | Freq: Four times a day (QID) | INTRAMUSCULAR | Status: DC | PRN

## 2020-08-20 MED ORDER — PHENYLEPHRINE 40 MCG/ML (10ML) SYRINGE FOR IV PUSH (FOR BLOOD PRESSURE SUPPORT)
PREFILLED_SYRINGE | INTRAVENOUS | Status: DC | PRN
  Administered 2020-08-20: 120 ug via INTRAVENOUS
  Administered 2020-08-20: 80 ug via INTRAVENOUS
  Administered 2020-08-20: 160 ug via INTRAVENOUS
  Administered 2020-08-20: 80 ug via INTRAVENOUS
  Administered 2020-08-20: 120 ug via INTRAVENOUS

## 2020-08-20 MED ORDER — CITALOPRAM HYDROBROMIDE 20 MG PO TABS
20.0000 mg | ORAL_TABLET | Freq: Every day | ORAL | Status: DC
  Administered 2020-08-21: 20 mg via ORAL
  Filled 2020-08-20: qty 1

## 2020-08-20 MED ORDER — INDIGOTINDISULFONATE SODIUM 8 MG/ML IJ SOLN
INTRAMUSCULAR | Status: DC | PRN
  Administered 2020-08-20: 40 mg via INTRAVENOUS

## 2020-08-20 MED ORDER — NALOXONE HCL 0.4 MG/ML IJ SOLN: 0.4000 mg | INTRAMUSCULAR | Status: DC | PRN

## 2020-08-20 MED ORDER — MIDAZOLAM HCL 5 MG/5ML IJ SOLN
INTRAMUSCULAR | Status: DC | PRN
  Administered 2020-08-20: 2 mg via INTRAVENOUS

## 2020-08-20 MED ORDER — ONDANSETRON HCL 4 MG PO TABS
4.0000 mg | ORAL_TABLET | Freq: Four times a day (QID) | ORAL | Status: DC | PRN
  Administered 2020-08-21 – 2020-08-22 (×3): 4 mg via ORAL
  Filled 2020-08-20 (×4): qty 1

## 2020-08-20 MED ORDER — LIDOCAINE 2% (20 MG/ML) 5 ML SYRINGE
INTRAMUSCULAR | Status: DC | PRN
  Administered 2020-08-20: 100 mg via INTRAVENOUS

## 2020-08-20 SURGICAL SUPPLY — 73 items
APPLICATOR ARISTA FLEXITIP XL (MISCELLANEOUS) ×3 IMPLANT
BARRIER ADHS 3X4 INTERCEED (GAUZE/BANDAGES/DRESSINGS) IMPLANT
BLADE SURG 15 STRL LF DISP TIS (BLADE) ×2 IMPLANT
BLADE SURG 15 STRL SS (BLADE) ×1
CABLE HIGH FREQUENCY MONO STRZ (ELECTRODE) ×3 IMPLANT
CANISTER SUCT 3000ML PPV (MISCELLANEOUS) ×3 IMPLANT
CANISTER WOUNDNEG PRESSURE 500 (CANNISTER) ×3 IMPLANT
COVER MAYO STAND STRL (DRAPES) ×3 IMPLANT
DECANTER SPIKE VIAL GLASS SM (MISCELLANEOUS) ×3 IMPLANT
DEFOGGER SCOPE WARMER CLEARIFY (MISCELLANEOUS) ×3 IMPLANT
DERMABOND ADVANCED (GAUZE/BANDAGES/DRESSINGS) ×1
DERMABOND ADVANCED .7 DNX12 (GAUZE/BANDAGES/DRESSINGS) ×2 IMPLANT
DEVICE SUTURE ENDOST 10MM (ENDOMECHANICALS) IMPLANT
DRAPE WARM FLUID 44X44 (DRAPES) ×3 IMPLANT
DRESSING PEEL AND PLAC PRVNA20 (GAUZE/BANDAGES/DRESSINGS) ×2 IMPLANT
DRSG OPSITE POSTOP 4X10 (GAUZE/BANDAGES/DRESSINGS) ×3 IMPLANT
DRSG PEEL AND PLACE PREVENA 20 (GAUZE/BANDAGES/DRESSINGS) ×3
DURAPREP 26ML APPLICATOR (WOUND CARE) ×3 IMPLANT
GLOVE BIOGEL PI IND STRL 7.5 (GLOVE) ×4 IMPLANT
GLOVE BIOGEL PI INDICATOR 7.5 (GLOVE) ×2
GLOVE SURG SS PI 7.0 STRL IVOR (GLOVE) ×12 IMPLANT
GLOVE SURG UNDER POLY LF SZ7 (GLOVE) ×6 IMPLANT
GOWN STRL REUS W/ TWL LRG LVL3 (GOWN DISPOSABLE) ×6 IMPLANT
GOWN STRL REUS W/ TWL XL LVL3 (GOWN DISPOSABLE) ×4 IMPLANT
GOWN STRL REUS W/TWL LRG LVL3 (GOWN DISPOSABLE) ×3
GOWN STRL REUS W/TWL XL LVL3 (GOWN DISPOSABLE) ×2
HEMOSTAT ARISTA ABSORB 3G PWDR (HEMOSTASIS) ×3 IMPLANT
HIBICLENS CHG 4% 4OZ BTL (MISCELLANEOUS) ×6 IMPLANT
KIT TURNOVER KIT B (KITS) ×3 IMPLANT
LIGASURE VESSEL 5MM BLUNT TIP (ELECTROSURGICAL) IMPLANT
MANIPULATOR VCARE LG CRV RETR (MISCELLANEOUS) IMPLANT
MANIPULATOR VCARE SML CRV RETR (MISCELLANEOUS) ×3 IMPLANT
MANIPULATOR VCARE STD CRV RETR (MISCELLANEOUS) IMPLANT
OCCLUDER COLPOPNEUMO (BALLOONS) ×3 IMPLANT
PACK LAPAROSCOPY BASIN (CUSTOM PROCEDURE TRAY) ×3 IMPLANT
PACK TRENDGUARD 450 HYBRID PRO (MISCELLANEOUS) IMPLANT
PAD ARMBOARD 7.5X6 YLW CONV (MISCELLANEOUS) ×6 IMPLANT
PAD OB MATERNITY 4.3X12.25 (PERSONAL CARE ITEMS) ×6 IMPLANT
PENCIL BUTTON HOLSTER BLD 10FT (ELECTRODE) ×3 IMPLANT
POUCH LAPAROSCOPIC INSTRUMENT (MISCELLANEOUS) ×6 IMPLANT
RTRCTR C-SECT PINK 25CM LRG (MISCELLANEOUS) ×3 IMPLANT
SCISSORS LAP 5X35 DISP (ENDOMECHANICALS) ×3 IMPLANT
SET CYSTO W/LG BORE CLAMP LF (SET/KITS/TRAYS/PACK) ×3 IMPLANT
SET IRRIG TUBING LAPAROSCOPIC (IRRIGATION / IRRIGATOR) ×3 IMPLANT
SET IRRIG Y TYPE TUR BLADDER L (SET/KITS/TRAYS/PACK) IMPLANT
SET TUBE SMOKE EVAC HIGH FLOW (TUBING) ×3 IMPLANT
SOLUTION ELECTROLUBE (MISCELLANEOUS) IMPLANT
SPONGE LAP 18X18 RF (DISPOSABLE) ×6 IMPLANT
SUT ENDO VLOC 180-0-8IN (SUTURE) IMPLANT
SUT MNCRL AB 4-0 PS2 18 (SUTURE) ×6 IMPLANT
SUT MON AB 4-0 PS1 27 (SUTURE) ×3 IMPLANT
SUT PLAIN 2 0 XLH (SUTURE) ×3 IMPLANT
SUT VIC AB 0 CT1 18XCR BRD 8 (SUTURE) ×6 IMPLANT
SUT VIC AB 0 CT1 36 (SUTURE) ×3 IMPLANT
SUT VIC AB 0 CT1 8-18 (SUTURE) ×3
SUT VIC AB 1 CTB1 27 (SUTURE) ×6 IMPLANT
SUT VIC AB 2-0 CT1 18 (SUTURE) ×3 IMPLANT
SUT VIC AB 2-0 SH 27 (SUTURE) ×1
SUT VIC AB 2-0 SH 27XBRD (SUTURE) ×2 IMPLANT
SUT VIC AB 3-0 CT1 27 (SUTURE) ×1
SUT VIC AB 3-0 CT1 TAPERPNT 27 (SUTURE) ×2 IMPLANT
SUT VICRYL 0 UR6 27IN ABS (SUTURE) ×6 IMPLANT
SYR 10ML LL (SYRINGE) ×3 IMPLANT
SYR 50ML LL SCALE MARK (SYRINGE) ×3 IMPLANT
SYSTEM CARTER THOMASON II (TROCAR) IMPLANT
TOWEL GREEN STERILE FF (TOWEL DISPOSABLE) ×6 IMPLANT
TRAY FOLEY W/BAG SLVR 14FR LF (SET/KITS/TRAYS/PACK) ×3 IMPLANT
TRENDGUARD 450 HYBRID PRO PACK (MISCELLANEOUS)
TROCAR ADV FIXATION 5X100MM (TROCAR) IMPLANT
TROCAR BALLN 12MMX100 BLUNT (TROCAR) ×3 IMPLANT
TROCAR XCEL NON-BLD 11X100MML (ENDOMECHANICALS) IMPLANT
UNDERPAD 30X36 HEAVY ABSORB (UNDERPADS AND DIAPERS) ×3 IMPLANT
YANKAUER SUCT BULB TIP NO VENT (SUCTIONS) ×3 IMPLANT

## 2020-08-20 NOTE — Brief Op Note (Addendum)
08/20/2020  4:29 PM  PATIENT:  [name]  45 y.o. female  PRE-OPERATIVE DIAGNOSIS:  AUB, failed endometrial ablation  POST-OPERATIVE DIAGNOSIS:  AUB, pelvic adhesive disease.   PROCEDURE: Diagnostic laparoscopy converted to TAH/BS/Cystoscopy/Prevena placed  SURGEON:  Surgeon(s) and Role:    * Aletha Halim, MD - Primary    * Anyanwu, Sallyanne Havers, MD - Assisting  ANESTHESIA:   local and general  EBL:  250 mL   BLOOD ADMINISTERED:none  DRAINS: indwelling foley, UOP 180mL   LOCAL MEDICATIONS USED:  MARCAINE     SPECIMEN:  Cervix, uterus, both tubes  DISPOSITION OF SPECIMEN:  PATHOLOGY  COUNTS:  YES  TOURNIQUET:  * No tourniquets in log *  DICTATION: .Note written in EPIC  PLAN OF CARE: Admit to inpatient   PATIENT DISPOSITION:  PACU - hemodynamically stable.   Delay start of Pharmacological VTE agent (>24hrs) due to surgical blood loss or risk of bleeding: not applicable  Durene Romans MD [phone] Attending [facility] [place] (Faculty Practice) 08/20/2020 Time: [phone]

## 2020-08-20 NOTE — H&P (Signed)
Obstetrics & Gynecology Surgical H&P   Date of Surgery: 08/20/2020    Primary OBGYN: [facility]  Reason for Admission: scheduled hysterectomy  History of Present Illness: Cassandra [name] is a 45 y.o. [phone] (Patient's last menstrual period was 07/10/2020.), with the above CC. PMHx is significant for BMI 65s, h/o c-section x 2, h/o BTL, HIV, HTN, DM2.  Patient with AUB s/p failed Minerva ablation.   ROS: A 12-point review of systems was performed and negative, except as stated in the above HPI.  OBGYN History: As per HPI. OB History  Gravida Para Term Preterm AB Living  2 2 2  0 0 2  SAB IAB Ectopic Multiple Live Births  0 0 0 0 2    # Outcome Date GA Lbr Len/2nd Weight Sex Delivery Anes PTL Lv  2 Term 2009    M CS-Unspec   LIV  1 Term 1999    M CS-Unspec   LIV     Past Medical History: Past Medical History:  Diagnosis Date  . Allergy   . 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 Anxiety   . Arthritis    Back  . Complication of anesthesia    Pt does not want an epidural, have had reactions to that medication  . Constipation    alternating from constipation to diarrhea  . COVID-19 07/12/2020  . Depression   . Diabetes mellitus without complication ([place])    Type II  . Gallstones 06/2016   by xray - surgery to remove  . Generalized headaches    frequent  . GERD (gastroesophageal reflux disease)   . History of abnormal Pap smear    remote  . History of gestational diabetes    first 2 pregnancies  . History of kidney stones   . 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[place])    CD4 level is 1100 per patient  . HSV-2 seropositive   . Hypertension   . IBS (irritable bowel syndrome)   . Morbid obesity ([place])   . Periodontal disease 08/2011   currently getting dental work  . Tobacco use     Past Surgical History: Past Surgical History:  Procedure Laterality Date  . BIOPSY  07/04/2018   Procedure: BIOPSY;  Surgeon: Thornton Park, MD;  Location: [facility];  Service: Gastroenterology;;  . CESAREAN SECTION  [phone]   x2  . CHOLECYSTECTOMY  2018  . COLONOSCOPY WITH PROPOFOL N/A 07/04/2018   for chronic diarrhea after cholecystectomy started on colestipol with benefit ([place]). Thought she has IBS-D. 2 TA, rpt 5 yrs  . ENDOMETRIAL BIOPSY  01/25/2020      . HYSTEROSCOPY WITH D & C N/A 01/05/2018   Minerva for heavy bleeding, IUD removed - DILATATION AND CURETTAGE /HYSTEROSCOPY WITH MINERVA ABLATION; [name], MD  . [place]  . POLYPECTOMY  07/04/2018   Procedure: POLYPECTOMY;  Surgeon: Thornton Park, MD;  Location: [facility] ENDOSCOPY;  Service: Gastroenterology;;  . TUBAL LIGATION    . WISDOM TOOTH EXTRACTION     x 4    Family History:  Family History  Problem Relation Age of Onset  . Hypertension Mother   . Hypertension Father   . Appendicitis Father   . Atrial fibrillation Father   . Diabetes Maternal Grandmother        s/p amputation  . Hyperlipidemia Maternal Grandmother   . Hypertension Maternal Grandmother   . Stroke Maternal Grandmother   . Hyperlipidemia  Maternal Grandfather   . Hypertension Maternal Grandfather   . Skin cancer Maternal Grandfather   . Diabetes Maternal Grandfather   . Seizures Paternal Grandmother 65       deceased  . Coronary artery disease Paternal Grandfather 9       MI  . Breast cancer Neg Hx   . Colon cancer Neg Hx   . Colon polyps Neg Hx   . Esophageal cancer Neg Hx   . Rectal cancer Neg Hx   . Stomach cancer Neg Hx     Social History:  Social History   Socioeconomic History  . Marital status: Married    Spouse name: Not on file  . Number of children: 2  . Years of education: Not on file  . Highest education level: Not on file  Occupational History  . Occupation: CSR  Tobacco Use  . Smoking status: Current Some Day Smoker    Packs/day: 0.10    Years: 23.00    Pack years: 2.30    Types: Cigarettes  . Smokeless tobacco: Never Used  .  Tobacco comment: 1 every now and then  Vaping Use  . Vaping Use: Never used  Substance and Sexual Activity  . Alcohol use: Yes    Alcohol/week: 0.0 standard drinks    Comment: occasional  . Drug use: No  . Sexual activity: Yes    Partners: Male    Birth control/protection: Surgical  Other Topics Concern  . Not on file  Social History Narrative   Lives with husband and 2 children, no pets   Occupation: call center rep   Edu: some college   Activity: no regular exercise.  Tries to walk around building.   Diet: 1 mt dew in am, rest water, fruits/vegetables daily    Social Determinants of Health   Financial Resource Strain: Not on file  Food Insecurity: Not on file  Transportation Needs: Not on file  Physical Activity: Not on file  Stress: Not on file  Social Connections: Not on file  Intimate Partner Violence: Not on file    Allergy: Allergies  Allergen Reactions  . Metformin And Related Diarrhea    Current Outpatient Medications: Medications Prior to Admission  Medication Sig Dispense Refill Last Dose  . acetaminophen (TYLENOL) 500 MG tablet Take 1,000 mg by mouth 2 (two) times daily as needed for moderate pain or headache.    Past Week at Unknown time  . amLODipine (NORVASC) 5 MG tablet TAKE 1 TABLET BY MOUTH EVERY DAY (Patient taking differently: Take 5 mg by mouth at bedtime.) 90 tablet 3 08/19/2020 at Unknown time  . BIKTARVY 50-200-25 MG TABS tablet Take 1 tablet by mouth at bedtime. 30 tablet 5 08/19/2020 at Unknown time  . citalopram (CELEXA) 20 MG tablet TAKE 1 TABLET BY MOUTH EVERY DAY (Patient taking differently: Take 20 mg by mouth at bedtime.) 90 tablet 3 08/19/2020 at Unknown time  . colestipol (COLESTID) 1 g tablet TAKE 2 TABLETS (2 G TOTAL) BY MOUTH 2 (TWO) TIMES DAILY. (Patient taking differently: Take 1 g by mouth daily.) 360 tablet 0 Past Month at Unknown time  . cyclobenzaprine (FLEXERIL) 5 MG tablet TAKE 1 TABLET (5 MG TOTAL) BY MOUTH 2 (TWO) TIMES DAILY AS  NEEDED (HEADACHE (SEDATION PRECAUTIONS)). 30 tablet 3 Past Month at Unknown time  . diphenhydrAMINE (BENADRYL) 25 MG tablet Take 25 mg by mouth at bedtime as needed for allergies or sleep.   08/19/2020 at Unknown time  . lisinopril (ZESTRIL) 10  MG tablet TAKE 1 TABLET BY MOUTH EVERY DAY (Patient taking differently: Take 10 mg by mouth at bedtime.) 90 tablet 3 08/19/2020 at Unknown time  . megestrol (MEGACE) 40 MG tablet Take 2 tablets (80 mg total) by mouth at bedtime. 60 tablet 0 Past Month at Unknown time  . pantoprazole (PROTONIX) 40 MG tablet TAKE 1 TABLET BY MOUTH TWICE A DAY 180 tablet 0 08/19/2020 at Unknown time  . Peppermint Oil (IBGARD PO) Take 2 tablets by mouth daily as needed (diarrhea).   Past Month at Unknown time  . Semaglutide, 1 MG/DOSE, (OZEMPIC, 1 MG/DOSE,) 4 MG/3ML SOPN Inject 0.75 mLs (1 mg total) into the skin once a week. (Patient not taking: Reported on 07/23/2020) 9 mL 3   . sitaGLIPtin (JANUVIA) 50 MG tablet Take 1 tablet (50 mg total) by mouth daily. 30 tablet 3 08/19/2020 at Unknown time  . Accu-Chek FastClix Lancets MISC 1 each by Does not apply route as directed. Use as directed to check blood sugar twice daily as needed. E11.8 102 each 3   . glucose blood (ACCU-CHEK GUIDE) test strip 1 each by Other route as needed for other. Use as instructed to check blood sugar 100 each 0   . hydrochlorothiazide (HYDRODIURIL) 25 MG tablet TAKE 1 TABLET DAILY 90 tablet 3 Not Taking at Unknown time  . loratadine (CLARITIN) 10 MG tablet Take 10 mg by mouth daily as needed for allergies.   More than a month at Unknown time  . topiramate (TOPAMAX) 25 MG tablet TAKE 1 TABLET BY MOUTH EVERYDAY AT BEDTIME (Patient taking differently: Take 25 mg by mouth daily as needed (headaches).) 90 tablet 2 More than a month at Unknown time     Hospital Medications: Current Facility-Administered Medications  Medication Dose Route Frequency Provider Last Rate Last Admin  . ceFAZolin (ANCEF) 3 g in dextrose  5 % 50 mL IVPB  3 g Intravenous On Call to Amoret, MD      . lactated ringers infusion   Intravenous Continuous Aletha Halim, MD 125 mL/hr at 08/20/20 1103 New Bag at 08/20/20 1103     Physical Exam:  Current Vital Signs 24h Vital Sign Ranges  T 98.8 F (37.1 C) Temp  Avg: 98.8 F (37.1 C)  Min: 98.8 F (37.1 C)  Max: 98.8 F (37.1 C)  BP 130/65 BP  Min: 130/65  Max: 130/65  HR 86 Pulse  Avg: 86  Min: 86  Max: 86  RR 18 Resp  Avg: 18  Min: 18  Max: 18  SaO2 99 % Room Air SpO2  Avg: 99 %  Min: 99 %  Max: 99 %       24 Hour I/O Current Shift I/O  Time Ins Outs No intake/output data recorded. No intake/output data recorded.    Body mass index is 44.85 kg/m. General appearance: Well nourished, well developed female in no acute distress.  Cardiovascular: S1, S2 normal, no murmur, rub or gallop, regular rate and rhythm Respiratory:  Clear to auscultation bilateral. Normal respiratory effort Abdomen: positive bowel sounds and no masses, hernias; diffusely non tender to palpation, non distended Neuro/Psych:  Normal mood and affect.  Skin:  Warm and dry.  Extremities: no clubbing, cyanosis, or edema.   Laboratory: UPT: neg  Recent Labs  Lab 08/20/20 1029  WBC 9.2  HGB 14.1  HCT 42.0  PLT 329   CMP Latest Ref Rng & Units 08/20/2020 04/02/2020 12/27/2019  Glucose 70 - 99 mg/dL 147(H) 104(H)  190(H)  BUN 6 - 20 mg/dL 9 6 8   Creatinine 0.44 - 1.00 mg/dL 1.17(H) 1.06 1.08  Sodium 135 - 145 mmol/L 139 138 139  Potassium 3.5 - 5.1 mmol/L 3.7 4.1 3.9  Chloride 98 - 111 mmol/L 106 102 104  CO2 22 - 32 mmol/L 25 27 29   Calcium 8.9 - 10.3 mg/dL 8.9 9.3 9.4  Total Protein 6.5 - 8.1 g/dL 7.1 7.3 6.8  Total Bilirubin 0.3 - 1.2 mg/dL 1.2 0.7 0.6  Alkaline Phos 38 - 126 U/L 74 73 73  AST 15 - 41 U/L 24 48(H) 49(H)  ALT 0 - 44 U/L 22 59(H) 55(H)    No results for input(s): APTT, INR, PTT in the last 168 hours.  Invalid input(s): DRHAPTT Recent Labs  Lab  08/20/20 1025  [place] PENDING    Imaging:  Narrative & Impression  CLINICAL DATA:  Abnormal uterine bleeding, history of endometrial ablation at, LMP 12/21/2019, still bleeding  EXAM: TRANSABDOMINAL AND TRANSVAGINAL ULTRASOUND OF PELVIS  TECHNIQUE: Both transabdominal and transvaginal ultrasound examinations of the pelvis were performed. Transabdominal technique was performed for global imaging of the pelvis including uterus, ovaries, adnexal regions, and pelvic cul-de-sac. It was necessary to proceed with endovaginal exam following the transabdominal exam to visualize the endometrium and ovaries. Transvaginal imaging is severely limited by bowel and shadowing.  COMPARISON:  11/02/2012  FINDINGS: Uterus  Measurements: 10.1 x 4.3 x 6.0 cm = volume: 135 mL. Grossly normal morphology without mass  Endometrium  Thickness: 9 mm. No endometrial fluid or mass grossly identified on transabdominal imaging, limited, and nonvisualized on transvaginal imaging.  Right ovary  Not visualized, likely obscured by bowel  Left ovary  Not visualized, likely obscured by bowel  Other findings  No free pelvic fluid or adnexal masses.  IMPRESSION: Significantly limited exam due to bowel with nondiagnostic endovaginal imaging.  Nonvisualization of ovaries.  Otherwise grossly unremarkable uterus and endometrial complex.   Electronically Signed   By: Lavonia Dana M.D.   On: 01/30/2020 17:05     Assessment: Cassandra [name] is a 45 y.o. [phone] (Patient's last menstrual period was 07/10/2020.) here for scheduled hysterectomy. Pt stable  Plan: Plan for TLH/BS/cysto. Pt is amenable to plan. Can proceed when OR is ready   Durene Romans MD Attending Center for [place] [facility])

## 2020-08-20 NOTE — Anesthesia Preprocedure Evaluation (Signed)
Anesthesia Evaluation  Patient identified by MRN, date of birth, ID band Patient awake    Reviewed: Allergy & Precautions, NPO status , Patient's Chart, lab work & pertinent test results  History of Anesthesia Complications Negative for: history of anesthetic complications  Airway Mallampati: II  TM Distance: >3 FB Neck ROM: Full    Dental  (+) Teeth Intact   Pulmonary neg pulmonary ROS, Current Smoker and Patient abstained from smoking.,    Pulmonary exam normal        Cardiovascular hypertension, negative cardio ROS Normal cardiovascular exam     Neuro/Psych Anxiety Depression negative neurological ROS     GI/Hepatic Neg liver ROS, GERD  ,  Endo/Other  diabetes, Type 2Morbid obesity  Renal/GU negative Renal ROS  negative genitourinary   Musculoskeletal negative musculoskeletal ROS (+)   Abdominal   Peds  Hematology  (+) HIV,   Anesthesia Other Findings   Reproductive/Obstetrics AUB                             Anesthesia Physical Anesthesia Plan  ASA: III  Anesthesia Plan: General   Post-op Pain Management:    Induction: Intravenous  PONV Risk Score and Plan: 2 and Ondansetron, Dexamethasone, Treatment may vary due to age or medical condition and Midazolam  Airway Management Planned: Oral ETT  Additional Equipment: None  Intra-op Plan:   Post-operative Plan: Extubation in OR  Informed Consent: I have reviewed the patients History and Physical, chart, labs and discussed the procedure including the risks, benefits and alternatives for the proposed anesthesia with the patient or authorized representative who has indicated his/her understanding and acceptance.     Dental advisory given  Plan Discussed with:   Anesthesia Plan Comments:         Anesthesia Quick Evaluation

## 2020-08-20 NOTE — Anesthesia Postprocedure Evaluation (Signed)
Anesthesia Post Note  Patient: Cassandra [name]  Procedure(s) Performed: ATTEMPTED TOTAL LAPAROSCOPIC HYSTERECTOMY WITH SALPINGECTOMY (Bilateral ) CYSTOSCOPY (N/A ) HYSTERECTOMY ABDOMINAL WITH SALPINGECTOMY (Bilateral )     Patient location during evaluation: PACU Anesthesia Type: General Level of consciousness: sedated Pain management: pain level controlled Vital Signs Assessment: post-procedure vital signs reviewed and stable Respiratory status: spontaneous breathing and respiratory function stable Cardiovascular status: stable Postop Assessment: no apparent nausea or vomiting Anesthetic complications: no   No complications documented.  Last Vitals:  Vitals:   08/20/20 1810 08/20/20 1816  BP: 118/77   Pulse: (!) 109   Resp: 15   Temp:  37.1 C  SpO2: 97%     Last Pain:  Vitals:   08/20/20 1808  TempSrc:   PainSc: Asleep                 SINGER,JAMES DANIEL

## 2020-08-20 NOTE — Transfer of Care (Signed)
Immediate Anesthesia Transfer of Care Note  Patient: Cassandra [name]  Procedure(s) Performed: ATTEMPTED TOTAL LAPAROSCOPIC HYSTERECTOMY WITH SALPINGECTOMY (Bilateral ) CYSTOSCOPY (N/A ) HYSTERECTOMY ABDOMINAL WITH SALPINGECTOMY (Bilateral )  Patient Location: PACU  Anesthesia Type:General  Level of Consciousness: awake, alert , oriented and patient cooperative  Airway & Oxygen Therapy: Patient Spontanous Breathing and Patient connected to face mask oxygen  Post-op Assessment: Report given to RN and Post -op Vital signs reviewed and stable  Post vital signs: Reviewed and stable  Last Vitals:  Vitals Value Taken Time  BP 87/46 08/20/20 1639  Temp    Pulse 118 08/20/20 1642  Resp 13 08/20/20 1642  SpO2 100 % 08/20/20 1642  Vitals shown include unvalidated device data.  Last Pain:  Vitals:   08/20/20 1058  TempSrc:   PainSc: 0-No pain         Complications: No complications documented.

## 2020-08-20 NOTE — Anesthesia Procedure Notes (Signed)
Procedure Name: Intubation Date/Time: 08/20/2020 12:38 PM Performed by: Colin Benton, CRNA Pre-anesthesia Checklist: Patient identified, Emergency Drugs available, Suction available and Patient being monitored Patient Re-evaluated:Patient Re-evaluated prior to induction Oxygen Delivery Method: Circle system utilized Preoxygenation: Pre-oxygenation with 100% oxygen Induction Type: IV induction Ventilation: Mask ventilation without difficulty Laryngoscope Size: Miller and 2 Tube type: Oral Tube size: 7.0 mm Number of attempts: 1 Airway Equipment and Method: Stylet Placement Confirmation: ETT inserted through vocal cords under direct vision,  positive ETCO2 and breath sounds checked- equal and bilateral Secured at: 22 cm Tube secured with: Tape Dental Injury: Teeth and Oropharynx as per pre-operative assessment

## 2020-08-21 ENCOUNTER — Encounter (HOSPITAL_COMMUNITY): Payer: Self-pay | Admitting: Obstetrics and Gynecology

## 2020-08-21 LAB — COMPREHENSIVE METABOLIC PANEL
ALT: 16 U/L (ref 0–44)
AST: 19 U/L (ref 15–41)
Albumin: 3.4 g/dL — ABNORMAL LOW (ref 3.5–5.0)
Alkaline Phosphatase: 58 U/L (ref 38–126)
Anion gap: 7 (ref 5–15)
BUN: 7 mg/dL (ref 6–20)
CO2: 25 mmol/L (ref 22–32)
Calcium: 8.7 mg/dL — ABNORMAL LOW (ref 8.9–10.3)
Chloride: 106 mmol/L (ref 98–111)
Creatinine, Ser: 0.91 mg/dL (ref 0.44–1.00)
GFR, Estimated: >60 mL/min (ref >60)
Glucose, Bld: 173 mg/dL — ABNORMAL HIGH (ref 70–99)
Potassium: 3.9 mmol/L (ref 3.5–5.1)
Sodium: 138 mmol/L (ref 135–145)
Total Bilirubin: 1.3 mg/dL — ABNORMAL HIGH (ref 0.3–1.2)
Total Protein: 6.2 g/dL — ABNORMAL LOW (ref 6.5–8.1)

## 2020-08-21 LAB — GLUCOSE, CAPILLARY
Glucose-Capillary: 166 mg/dL — ABNORMAL HIGH (ref 70–99)
Glucose-Capillary: 156 mg/dL — ABNORMAL HIGH (ref 70–99)
Glucose-Capillary: 158 mg/dL — ABNORMAL HIGH (ref 70–99)
Glucose-Capillary: 131 mg/dL — ABNORMAL HIGH (ref 70–99)
Glucose-Capillary: 136 mg/dL — ABNORMAL HIGH (ref 70–99)

## 2020-08-21 LAB — CBC
HCT: 33.8 % — ABNORMAL LOW (ref 36.0–46.0)
Hemoglobin: 11.1 g/dL — ABNORMAL LOW (ref 12.0–15.0)
MCH: 32 pg (ref 26.0–34.0)
MCHC: 32.8 g/dL (ref 30.0–36.0)
MCV: 97.4 fL (ref 80.0–100.0)
Platelets: 263 10*3/uL (ref 150–400)
RBC: 3.47 MIL/uL — ABNORMAL LOW (ref 3.87–5.11)
RDW: 11.8 % (ref 11.5–15.5)
WBC: 13.4 10*3/uL — ABNORMAL HIGH (ref 4.0–10.5)
nRBC: 0 % (ref 0.0–0.2)

## 2020-08-21 MED ORDER — ENOXAPARIN SODIUM 80 MG/0.8ML ~~LOC~~ SOLN
65.0000 mg | SUBCUTANEOUS | Status: DC
  Administered 2020-08-21: 65 mg via SUBCUTANEOUS
  Filled 2020-08-21 (×2): qty 0.65

## 2020-08-21 MED ORDER — HYDROCHLOROTHIAZIDE 25 MG PO TABS: 25.0000 mg | ORAL_TABLET | Freq: Every day | ORAL | Status: DC

## 2020-08-21 MED ORDER — BISACODYL 5 MG PO TBEC
5.0000 mg | DELAYED_RELEASE_TABLET | Freq: Once | ORAL | Status: AC
  Administered 2020-08-21: 5 mg via ORAL
  Filled 2020-08-21: qty 1

## 2020-08-21 MED ORDER — INSULIN ASPART 100 UNIT/ML ~~LOC~~ SOLN
6.0000 [IU] | Freq: Three times a day (TID) | SUBCUTANEOUS | Status: DC
  Administered 2020-08-21 – 2020-08-22 (×5): 6 [IU] via SUBCUTANEOUS

## 2020-08-21 MED ORDER — INSULIN ASPART 100 UNIT/ML ~~LOC~~ SOLN
0.0000 [IU] | Freq: Three times a day (TID) | SUBCUTANEOUS | Status: DC
  Administered 2020-08-21 (×3): 4 [IU] via SUBCUTANEOUS
  Administered 2020-08-22 (×2): 3 [IU] via SUBCUTANEOUS

## 2020-08-21 MED ORDER — INSULIN ASPART 100 UNIT/ML ~~LOC~~ SOLN: 0.0000 [IU] | Freq: Every day | SUBCUTANEOUS | Status: DC

## 2020-08-21 MED ORDER — OXYCODONE HCL 5 MG PO TABS
5.0000 mg | ORAL_TABLET | ORAL | Status: DC | PRN
  Administered 2020-08-21 – 2020-08-22 (×8): 10 mg via ORAL
  Filled 2020-08-21 (×8): qty 2

## 2020-08-21 MED ORDER — PANTOPRAZOLE SODIUM 40 MG PO TBEC
40.0000 mg | DELAYED_RELEASE_TABLET | Freq: Every day | ORAL | Status: DC
  Administered 2020-08-22: 40 mg via ORAL
  Filled 2020-08-21: qty 1

## 2020-08-21 NOTE — Progress Notes (Signed)
Gynecology Post Op Note  Admission Date: 08/20/2020 Current Date: 08/21/2020 8:14 AM  Cassandra [name] is a 45 y.o. [id number] POD#1 diagnostic laparoscopy converted to TAH/bilateral salpingectomy/cystoscopy/LOA >45 minutes for AUB  History complicated by: Patient Active Problem List   Diagnosis Date Noted  . Abnormal uterine bleeding 08/20/2020  . Status post hysterectomy 08/20/2020  . Uterine bleeding 08/20/2020  . COVID-19 07/13/2020  . 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  . Migraine 06/30/2019  . Vitamin B12 deficiency 12/31/2018  . Paresthesia of right foot 12/27/2018  . GERD (gastroesophageal reflux disease) 12/27/2018  . Polyp of descending colon   . Chronic diarrhea 04/04/2018  . Cervical high risk human papillomavirus (HPV) DNA test positive 11/17/2017  . Closed compression fracture of L1 lumbar vertebra 07/05/2016  . Lumbar pain with radiation down both legs 12/20/2015  . Health maintenance examination 05/25/2014  . Ulnar neuropathy 06/28/2013  . Heavy menstrual bleeding 10/19/2012  . Hypertension 09/14/2012  . HIV (human immunodeficiency virus infection) ([place]) 01/04/2012  . Varicose vein 01/01/2012  . MDD (major depressive disorder), recurrent episode, moderate ([place]) 09/02/2011  . Morbid obesity with BMI of 45.0-49.9, adult ([place])   . Ex-smoker   . Controlled diabetes mellitus type 2 with complications ([place])     ROS and patient/family/surgical history, located on admission H&P note dated 08/20/2020, have been reviewed, and there are no changes except as noted below Yesterday/Overnight Events:  None  Subjective:  +ambulation, taking clears w/o issue, pain controlled with PCA. No flatus, nausea, vomiting, fevers, chills, chest pain, sob.   Objective:    Current Vital Signs 24h Vital Sign Ranges  T 98.4 F (36.9 C) Temp  Avg: 98.4 F (36.9 C)  Min: 97 F (36.1 C)  Max: 98.9 F (37.2 C)  BP 104/62 BP  Min: 87/46  Max: 142/77  HR 86 Pulse  Avg: 104.2   Min: 86  Max: 119  RR 14 Resp  Avg: 15.8  Min: 12  Max: 19  SaO2 97 % Nasal Cannula SpO2  Avg: 95.9 %  Min: 85 %  Max: 100 %       24 Hour I/O Current Shift I/O  Time Ins Outs 03/15 0701 - 03/16 0700 In: 2774 [I.V.:1400] Out: 1500 [Urine:1250] No intake/output data recorded.   Patient Vitals for the past 12 hrs:  BP Temp Temp src Pulse Resp SpO2  08/21/20 0812 104/62 98.4 F (36.9 C) Oral 86 14 97 %  08/21/20 0412 120/64 98.5 F (36.9 C) Oral 88 18 94 %  08/21/20 0402 -- -- -- -- 16 95 %  08/21/20 0025 -- -- -- -- -- 95 %  08/20/20 2350 -- -- -- -- 17 95 %  08/20/20 2326 (!) 142/77 98.9 F (37.2 C) Oral (!) 105 17 98 %  08/20/20 2315 -- -- -- -- -- 96 %  08/20/20 2300 -- -- -- -- -- 96 %  08/20/20 2245 -- -- -- -- -- 95 %  08/20/20 2230 -- -- -- -- -- 96 %  08/20/20 2215 -- -- -- -- -- 95 %  08/20/20 2210 -- -- -- -- -- 94 %  08/20/20 2205 -- -- -- -- -- 94 %  08/20/20 2200 -- -- -- -- -- 94 %  08/20/20 2155 -- -- -- -- -- 94 %  08/20/20 2150 -- -- -- -- -- 94 %  08/20/20 2145 -- -- -- -- -- 94 %  08/20/20 2130 -- -- -- -- -- 94 %  08/20/20 2120 -- -- -- -- -- 97 %  08/20/20 2115 -- -- -- -- -- 96 %  08/20/20 2110 -- -- -- -- -- 97 %  08/20/20 2105 -- -- -- -- -- 96 %  08/20/20 2100 111/66 -- -- 100 -- 96 %  08/20/20 2055 -- -- -- -- -- 96 %  08/20/20 2050 -- -- -- -- -- 97 %  08/20/20 2045 -- -- -- -- -- 97 %  08/20/20 2040 -- -- -- -- -- 96 %  08/20/20 2035 -- -- -- -- -- 96 %  08/20/20 2030 -- -- -- -- -- 96 %  08/20/20 2025 -- -- -- -- -- 97 %  08/20/20 2020 -- -- -- -- -- 97 %    Physical exam: General appearance: alert, cooperative and appears stated age Abdomen: +BS, ND, nttp, prevena working GU: No gross VB. Foley cath with clear UOP, approximately 522mL in bag Lungs: clear to auscultation bilaterally Heart: S1, S2 normal, no murmur, rub or gallop, regular rate and rhythm Extremities: no c/c/e Skin: warm and dry Psych: appropriate Neurologic: Grossly  normal  Medications Current Facility-Administered Medications  Medication Dose Route Frequency Provider Last Rate Last Admin  . amLODipine (NORVASC) tablet 5 mg  5 mg Oral Daily Aletha Halim, MD      . bictegravir-emtricitabine-tenofovir AF (BIKTARVY) 50-200-25 MG per tablet 1 tablet  1 tablet Oral QHS Aletha Halim, MD      . citalopram (CELEXA) tablet 20 mg  20 mg Oral QHS Aletha Halim, MD      . colestipol (COLESTID) tablet 1 g  1 g Oral Daily Aletha Halim, MD      . diphenhydrAMINE (BENADRYL) injection 12.5 mg  12.5 mg Intravenous Q6H PRN Aletha Halim, MD       Or  . diphenhydrAMINE (BENADRYL) 12.5 MG/5ML elixir 12.5 mg  12.5 mg Oral Q6H PRN Aletha Halim, MD      . HYDROmorphone (DILAUDID) 1 mg/mL PCA injection   Intravenous Q4H Aletha Halim, MD   0.9 mg at 08/20/20 2350  . insulin aspart (novoLOG) injection 0-20 Units  0-20 Units Subcutaneous Q4H Aletha Halim, MD   4 Units at 08/21/20 0500  . lactated ringers infusion   Intravenous Continuous Aletha Halim, MD 125 mL/hr at 08/21/20 0342 New Bag at 08/21/20 0342  . lisinopril (ZESTRIL) tablet 10 mg  10 mg Oral QHS Aletha Halim, MD      . naloxone [facility]) injection 0.4 mg  0.4 mg Intravenous PRN Aletha Halim, MD       And  . sodium chloride flush (NS) 0.9 % injection 9 mL  9 mL Intravenous PRN Aletha Halim, MD      . ondansetron (ZOFRAN) tablet 4 mg  4 mg Oral Q6H PRN Aletha Halim, MD       Or  . ondansetron (ZOFRAN) injection 4 mg  4 mg Intravenous Q6H PRN Aletha Halim, MD      . ondansetron (ZOFRAN) injection 4 mg  4 mg Intravenous Q6H PRN Aletha Halim, MD      . pantoprazole (PROTONIX) injection 40 mg  40 mg Intravenous QHS Aletha Halim, MD   40 mg at 08/20/20 2215  . polyethylene glycol (MIRALAX / GLYCOLAX) packet 17 g  17 g Oral Q supper Aletha Halim, MD      . simethicone [facility]) chewable tablet 80 mg  80 mg Oral TID Aletha Halim, MD   80 mg at 08/20/20 2216  Labs  Recent Labs  Lab 08/20/20 1029 08/20/20 1711 08/21/20 0614  WBC 9.2  --  13.4*  HGB 14.1 11.5* 11.1*  HCT 42.0 33.7* 33.8*  PLT 329  --  263    Recent Labs  Lab 08/20/20 1029 08/21/20 0614  NA 139 138  K 3.7 3.9  CL 106 106  CO2 25 25  BUN 9 7  CREATININE 1.17* 0.91  CALCIUM 8.9 8.7*  PROT 7.1 6.2*  BILITOT 1.2 1.3*  ALKPHOS 74 58  ALT 22 16  AST 24 19  GLUCOSE 147* 173*   Results for NATE, PERRI (MRN [id number]) as of 08/21/2020 08:23  Ref. Range 08/20/2020 20:17 08/20/2020 23:38 08/21/2020 04:08 08/21/2020 06:14 08/21/2020 08:04  Glucose-Capillary Latest Ref Range: 70 - 99 mg/dL 242 (H) 189 (H) 166 (H)  156 (H)   Radiology No new imaging  Assessment & Plan:  Pt doing well *GYN: routine post op care. D/c foley and pca. Continue with OOB. Follow up final pathology *HTN: continue home meds. Cr improved today so can start home hctz. *DM2: hold home januvia until taking regular PO well. Continue SSI with achs CBG checks *ID: continue home HAART *Pain: start PO PRNs *FEN/GI: did well on clears. Will do DM diet. D/c MIVF after pt voids *PPx: SCDs, OOB ad lib *Dispo: hopefully tomorrow  Code Status: Full Code  Durene Romans MD Attending Center for [facility])

## 2020-08-21 NOTE — Op Note (Addendum)
Operative Note   08/20/2020  PRE-OP DIAGNOSIS *Abnormal uterine bleeding *History of failed endometrial ablation    POST-OP DIAGNOSIS *Same *Pelvic adhesive disease   SURGEON: Surgeon(s) and Role:    * Aletha Halim, MD - Primary  ASSISTANT:    * Harolyn Rutherford, Sallyanne Havers, MD - Assisting  An experienced assistant was required given the standard of surgical care given the complexity of the case. This assistant was needed for exposure, dissection, suctioning, retraction, instrument exchange, and for overall help during the procedure.   PROCEDURE: Diagnostic laparoscopy converted to total abdominal hysterectomy, bilateral salpingectomy, lysis of adhesions approximately 45 minutes, cystoscopy. Prevena placement  ANESTHESIA: General and local  ESTIMATED BLOOD LOSS: 259mL  DRAINS: indwelling foley UOP 19mL   TOTAL IV FLUIDS: per anesthesia note  VTE PROPHYLAXIS: SCDs to the bilateral lower extremities  ANTIBIOTICS: Three grams of Cefazolin were given. within 1 hour of skin incision  SPECIMENS: cervix, uterus, bilateral fallopian tubes  DISPOSITION: PACU - hemodynamically stable.  CONDITION: stable  COMPLICATIONS: Dense pelvic adhesions necessitating conversion to an open procedure.   FINDINGS: On diagnostic laparoscopy, omentum adhered to posterior aspect of anterior abdominal wall. Uterus deviated to the left and adhered to the anterior abdominal near the left pelvic brim, from the fundus to the bladder.   Grossly normal ovaries, tubes with surgically absent mid sections on both sides, cervix, normal bladder and ureteral orifices; positive bilateral efflux of blue urine from ureteral orifices. Grossly normal uterus  DESCRIPTION OF PROCEDURE: After informed consent was obtained, the patient was prepped and draped in the usual sterile manner and placed in the dorsal lithotomy position with arms carefully tucked.  A time out was done, and a foley catheter and then a small v-care  uterine manipulator was then placed. New gloves donned, and then the umbilicus was injected with local anesthesia and an infraumbilical incision was made with the 15 blade.  Using the open technique, the abdomen was entered and a balloon trocar was placed.  The abdomen was insufflated to 30mmHg with CO2 and the laparoscope placed with no injury noted below the entry site.  The patient was placed into Trendelenburg with the above noted findings.  Given the extensive amount of adhesions, the decision was made to convert to an open procedure.   Once inside the abdominal cavity, via a pfannenstiel incision, the bowel was packed away with moist lap sponges. The uterus was then identified with the above noted findings.  The right cornua was grasped with a Kelly clamp. An avascular space was identified in the broad ligament and a window was made and another Kelly clamp placed and the initial one moved through the window. This was cut and suture ligated with a 0- vicryl free tie and then suture ligated.  The round ligament was then able to be identified on the right side and this was suture ligated, tagged and cut.  The bladder flap was able to be made with sharp dissection and carried through to the opposite side; the bladder was easily mobilized well below the uterus and mid cervix.  The uterine arteries on the right side were skeletonized and Heaney clamps used to doubly clamp the arteries. This was then cut and suture ligated with 0-Vicryl.   On the left side, the adhesion was carefully taken down with sharp dissection.  This allowed the adhesion to be isolated and to be clear of the left IP.  A large Kelly clamp was placed across the adhesion and cut and suture  ligated.  This allowed normal anatomy to be relatively restored and the left adnexa was doubly clamped, cut and ligated with a free tie and then a suture ligated.  Using more more sharp dissection, the uterine arteries were skeletonized and clamped with  Heaney clamp, cut and suture ligated.  The bladder was noted to be well below and straight clamps were used to get to the level of the external os.  Curved Heaney clamps were used to come across the vagina and cut and the specimen removed from the patient.  The vaginal cuff was then closed with several figure of eight sutures of 0-Vicryl.  The right tube was then identified and cross clamped at the meso salpingx and then suture ligated with 0-vicryl.  The same was done on the left side.  The patient had some oozing in the lower pelvis on the left side after irrigation. The left ureter was identified and well away from the operative field. This area was oversewn with right angle clamps and 3-0 vicryl. Arista was applied and the area packed. While the cystoscopy was done. The umbilical fascia was closed with 0-vicryl  Indigo carmine was given and a 31F, 70 degree scope was introduced after removal of the foley, with the above noted findings. Bladder emptied and foley replaced.  New gloves were donned and attention back to the abdomen.  The abdomen was hemostatic and all instruments and sponges removed.  The fascia was closed with 0 looped PDS, the subcutaneous tissue closed with interuppted sutures of 3-0 plain gut and the skin closed with 4-0 monocryl and also at the umbilical site.  A Prevena device was then placed.   Hemostasis was secured throughout the entire layers. Instrument counts were correct x 2 and the patient was taken to the recovery room in good condition and breathing independently.  Durene Romans MD Attending Center for [employer] [facility])

## 2020-08-21 NOTE — Progress Notes (Signed)
PCA was d/c'd, witnessed Dilaudid 28ml wasted by Michele Rockers RN.

## 2020-08-21 NOTE — Progress Notes (Signed)
Wasted 38ml 1:1 of Dilaudid PCA syringe after order discontinued. Witnessed by [employer]

## 2020-08-21 NOTE — Progress Notes (Signed)
Pt transported to 5N via bed, accompanied by [facility].

## 2020-08-22 LAB — GLUCOSE, CAPILLARY
Glucose-Capillary: 150 mg/dL — ABNORMAL HIGH (ref 70–99)
Glucose-Capillary: 137 mg/dL — ABNORMAL HIGH (ref 70–99)
Glucose-Capillary: 125 mg/dL — ABNORMAL HIGH (ref 70–99)

## 2020-08-22 MED ORDER — OXYCODONE HCL 5 MG PO TABS: 5.0000 mg | ORAL_TABLET | ORAL | 0 refills | Status: DC | PRN

## 2020-08-22 MED ORDER — FLEET ENEMA 7-19 GM/118ML RE ENEM: 1.0000 | ENEMA | Freq: Once | RECTAL | Status: DC

## 2020-08-22 MED ORDER — POLYETHYLENE GLYCOL 3350 17 G PO PACK: 17.0000 g | PACK | Freq: Every day | ORAL | 0 refills | Status: AC

## 2020-08-22 MED ORDER — BISACODYL 5 MG PO TBEC: 5.0000 mg | DELAYED_RELEASE_TABLET | Freq: Every day | ORAL | 0 refills | Status: DC | PRN

## 2020-08-22 MED ORDER — BISACODYL 10 MG RE SUPP
10.0000 mg | Freq: Once | RECTAL | Status: AC
  Administered 2020-08-22: 10 mg via RECTAL
  Filled 2020-08-22: qty 1

## 2020-08-22 MED ORDER — FLEET ENEMA 7-19 GM/118ML RE ENEM
1.0000 | ENEMA | Freq: Once | RECTAL | Status: DC
  Filled 2020-08-22: qty 1

## 2020-08-22 MED ORDER — SIMETHICONE 80 MG PO CHEW: 80.0000 mg | CHEWABLE_TABLET | Freq: Four times a day (QID) | ORAL | 0 refills | Status: DC | PRN

## 2020-08-22 MED ORDER — BISACODYL 5 MG PO TBEC: 5.0000 mg | DELAYED_RELEASE_TABLET | Freq: Every day | ORAL | Status: DC | PRN

## 2020-08-22 NOTE — Progress Notes (Addendum)
Provided discharge education/instructions, all questions and concerns addressed, Pt not in distress. Pt to discharge home with belongings accompanied by husband.  Switched to [employer] wound vac, dressing clean/dry/intact, no error noted on the wound vac.

## 2020-08-22 NOTE — Discharge Instructions (Signed)
Abdominal Hysterectomy, Care After The following information offers guidance on how to care for yourself after your procedure. Your doctor may also give you more specific instructions. If you have problems or questions, contact your doctor. What can I expect after the procedure? After the procedure, it is common to have:  Pain.  Tiredness.  No desire to eat.  Less interest in sex.  Bleeding and fluid (discharge) from your vagina. You may need to use a pad after this procedure.  Trouble pooping (constipation).  Feelings of sadness or other emotions. Follow these instructions at home: Medicines  Take over-the-counter and prescription medicines only as told by your doctor.  Do not take aspirin or NSAIDs, such as ibuprofen. These medicines can cause bleeding.  If you were prescribed an antibiotic medicine, take it as told by your doctor. Do not stop using the antibiotic even if you start to feel better.  If told, take steps to prevent problems with pooping (constipation). You may need to: ? Drink enough fluid to keep your pee (urine) pale yellow. ? Take medicines. You will be told what medicines to take. ? Eat foods that are high in fiber. These include beans, whole grains, and fresh fruits and vegetables. ? Limit foods that are high in fat and sugar. These include fried or sweet foods.  Ask your doctor if you should avoid driving or using machines while you are taking your medicine. Surgical cut care  Follow instructions from your doctor about how to take care of your cut from surgery (incision). Make sure you: ? Wash your hands with soap and water for at least 20 seconds before and after you change your bandage. If you cannot use soap and water, use hand sanitizer. ? Change your bandage. ? Leave stitches or skin glue in place for at least two weeks. ? Leave tape strips alone unless you are told to take them off. You may trim the edges of the tape strips if they curl up.  Keep  the bandage dry until your doctor says it can be taken off.  Check your incision every day for signs of infection. Check for: ? More redness, swelling, or pain. ? Fluid or blood. ? Warmth. ? Pus or a bad smell.   Activity  Rest as told by your doctor.  Get up to take short walks every 1 to 2 hours. Ask for help if you feel weak or unsteady.  Do not lift anything that is heavier than 10 lb (4.5 kg), or the limit that you are told.  Follow your doctor's advice about exercise, driving, and general activities.  Return to your normal activities when your doctor says that it is safe.   Lifestyle  Do not douche, use tampons, or have sex for at least 6 weeks or as told by your doctor.  Do not drink alcohol until your doctor says it is okay.  Do not smoke or use any products that contain nicotine or tobacco. These can delay healing after surgery. If you need help quitting, ask your doctor. General instructions  Do not take baths, swim, or use a hot tub. Ask your doctor about taking showers or sponge baths.  Try to have a responsible adult at home with you for the first 1-2 weeks to help with your daily chores.  Wear tight-fitting (compression) stockings as told by your doctor.  Keep all follow-up visits. Contact a doctor if:  You have chills or a fever.  You have any of these signs  of infection around your cut: ? More redness, swelling, or pain. ? Fluid or blood. ? Warmth. ? Pus or a bad smell.  Your cut breaks open.  You feel dizzy or light-headed.  You have pain or bleeding when you pee.  You keep having watery poop (diarrhea).  You keep feeling like you may vomit or you keep vomiting.  You have fluid coming from your vagina that is not normal.  You have any type of reaction to your medicine that is not normal, like a rash, or you develop an allergy to your medicine.  Your pain medicine does not help. Get help right away if:  You have a fever and your symptoms  get worse suddenly.  You have very bad pain in your belly (abdomen).  You are short of breath.  You faint.  You have pain, swelling, or redness of your leg.  You bleed a lot from your vagina and you see blood clots. Summary  It is normal to have some pain, tiredness, and fluid that comes from your vagina.  Do not take baths, swim, or use a hot tub. Ask your doctor about taking showers or sponge baths.  Do not lift anything that is heavier than 10 lb (4.5 kg), or the limit that you are told.  Follow your doctor's advice about exercise, driving, and general activities.  Try to have a responsible adult at home with you for the first 1-2 weeks to help with your daily chores. This information is not intended to replace advice given to you by your health care provider. Make sure you discuss any questions you have with your health care provider. Document Revised: 01/25/2020 Document Reviewed: 01/25/2020 Elsevier Patient Education  2021 [employer]  

## 2020-08-22 NOTE — Discharge Summary (Signed)
Gynecology Discharge Summary Date of Admission: 08/20/2020 Date of Discharge: 08/22/2020  The patient was admitted, as scheduled, and underwent a diagnostic laparoscopy converted to a TAH/BS/cystoscopy/LOA; please refer to operative note for full details.  She was meeting all post op goals, including a BM, and discharged to home on POD#2.  Patient states she was not taking her HCTZ at home.   Allergies as of 08/22/2020       Reactions   Metformin And Related Diarrhea        Medication List     STOP taking these medications    hydrochlorothiazide 25 MG tablet Commonly known as: HYDRODIURIL   megestrol 40 MG tablet Commonly known as: MEGACE   Ozempic (1 MG/DOSE) 4 MG/3ML Sopn Generic drug: Semaglutide (1 MG/DOSE)       TAKE these medications    Accu-Chek FastClix Lancets Misc 1 each by Does not apply route as directed. Use as directed to check blood sugar twice daily as needed. E11.8   Accu-Chek Guide test strip Generic drug: glucose blood 1 each by Other route as needed for other. Use as instructed to check blood sugar   acetaminophen 500 MG tablet Commonly known as: TYLENOL Take 1,000 mg by mouth 2 (two) times daily as needed for moderate pain or headache.   amLODipine 5 MG tablet Commonly known as: NORVASC TAKE 1 TABLET BY MOUTH EVERY DAY What changed: when to take this   Biktarvy 50-200-25 MG Tabs tablet Generic drug: bictegravir-emtricitabine-tenofovir AF Take 1 tablet by mouth at bedtime.   bisacodyl 5 MG EC tablet Commonly known as: DULCOLAX Take 1 tablet (5 mg total) by mouth daily as needed for moderate constipation.   citalopram 20 MG tablet Commonly known as: CELEXA TAKE 1 TABLET BY MOUTH EVERY DAY What changed: when to take this   colestipol 1 g tablet Commonly known as: COLESTID TAKE 2 TABLETS (2 G TOTAL) BY MOUTH 2 (TWO) TIMES DAILY. What changed:  how much to take when to take this   cyclobenzaprine 5 MG tablet Commonly known as:  FLEXERIL TAKE 1 TABLET (5 MG TOTAL) BY MOUTH 2 (TWO) TIMES DAILY AS NEEDED (HEADACHE (SEDATION PRECAUTIONS)).   diphenhydrAMINE 25 MG tablet Commonly known as: BENADRYL Take 25 mg by mouth at bedtime as needed for allergies or sleep.   IBGARD PO Take 2 tablets by mouth daily as needed (diarrhea).   lisinopril 10 MG tablet Commonly known as: ZESTRIL TAKE 1 TABLET BY MOUTH EVERY DAY What changed: when to take this   loratadine 10 MG tablet Commonly known as: CLARITIN Take 10 mg by mouth daily as needed for allergies.   oxyCODONE 5 MG immediate release tablet Commonly known as: Oxy IR/ROXICODONE Take 1-2 tablets (5-10 mg total) by mouth every 4 (four) hours as needed for moderate pain or severe pain (5mg  for pain score  4-6; 10mg  for  7-10).   pantoprazole 40 MG tablet Commonly known as: PROTONIX TAKE 1 TABLET BY MOUTH TWICE A DAY   polyethylene glycol 17 g packet Commonly known as: MIRALAX / GLYCOLAX Take 17 g by mouth daily.   simethicone 80 MG chewable tablet Commonly known as: MYLICON Chew 1 tablet (80 mg total) by mouth 4 (four) times daily as needed for flatulence.   sitaGLIPtin 50 MG tablet Commonly known as: Januvia Take 1 tablet (50 mg total) by mouth daily.   topiramate 25 MG tablet Commonly known as: TOPAMAX TAKE 1 TABLET BY MOUTH EVERYDAY AT BEDTIME What changed: See the new  instructions.        Future Appointments  Date Time Provider [place]  08/27/2020  9:30 AM [facility] NURSE [facility]  09/26/2020 11:00 AM Aletha Halim, MD [facility]  10/21/2020  9:30 AM Ria Bush, MD [facility]  11/19/2020  9:45 AM Tsosie Billing, MD [facility] None    Durene Romans MD Attending Center for [facility])

## 2020-08-22 NOTE — Progress Notes (Signed)
Gynecology Post Op Note  Admission Date: 08/20/2020 Current Date: 08/22/2020 7:45 AM  Cassandra [name] is a 45 y.o. [id number] POD#2 diagnostic laparoscopy converted to TAH/bilateral salpingectomy/cystoscopy/LOA >45 minutes for AUB  History complicated by: Patient Active Problem List   Diagnosis Date Noted  . Abnormal uterine bleeding 08/20/2020  . Status post hysterectomy 08/20/2020  . Uterine bleeding 08/20/2020  . COVID-19 07/13/2020  . 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  . Migraine 06/30/2019  . Vitamin B12 deficiency 12/31/2018  . Paresthesia of right foot 12/27/2018  . GERD (gastroesophageal reflux disease) 12/27/2018  . Polyp of descending colon   . Chronic diarrhea 04/04/2018  . Cervical high risk human papillomavirus (HPV) DNA test positive 11/17/2017  . Closed compression fracture of L1 lumbar vertebra 07/05/2016  . Lumbar pain with radiation down both legs 12/20/2015  . Health maintenance examination 05/25/2014  . Ulnar neuropathy 06/28/2013  . Heavy menstrual bleeding 10/19/2012  . Hypertension 09/14/2012  . HIV (human immunodeficiency virus infection) ([place]) 01/04/2012  . Varicose vein 01/01/2012  . MDD (major depressive disorder), recurrent episode, moderate ([place]) 09/02/2011  . Morbid obesity with BMI of 45.0-49.9, adult ([place])   . Ex-smoker   . Controlled diabetes mellitus type 2 with complications ([place])     ROS and patient/family/surgical history, located on admission H&P note dated 08/20/2020, have been reviewed, and there are no changes except as noted below Yesterday/Overnight Events:  None  Subjective:  Meeting all postop goals except no flatus and feeling cramping in lower belly area. .   Objective:    Current Vital Signs 24h Vital Sign Ranges  T 98.5 F (36.9 C) Temp  Avg: 98.5 F (36.9 C)  Min: 98.4 F (36.9 C)  Max: 98.7 F (37.1 C)  BP 106/63 BP  Min: 104/62  Max: 123/70  HR 97 Pulse  Avg: 91.5  Min: 86  Max: 97  RR 17 Resp  Avg:  15.6  Min: 14  Max: 17  SaO2 92 % Room Air SpO2  Avg: 95.4 %  Min: 92 %  Max: 99 %       24 Hour I/O Current Shift I/O  Time Ins Outs No intake/output data recorded. No intake/output data recorded.   Patient Vitals for the past 12 hrs:  BP Temp Temp src Pulse Resp SpO2  08/22/20 0312 106/63 98.5 F (36.9 C) - 97 17 92 %  08/21/20 1955 123/70 98.5 F (36.9 C) Oral 96 16 95 %    Physical exam: General appearance: alert, cooperative and appears stated age Abdomen: +BS, mild to moderately distended, nttp, prevena working GU: No gross VB. Foley cath with clear UOP, approximately 561mL in bag Lungs: clear to auscultation bilaterally Heart: S1, S2 normal, no murmur, rub or gallop, regular rate and rhythm Extremities: no c/c/e Skin: warm and dry Psych: appropriate Neurologic: Grossly normal  Medications Current Facility-Administered Medications  Medication Dose Route Frequency Provider Last Rate Last Admin  . amLODipine (NORVASC) tablet 5 mg  5 mg Oral Daily Aletha Halim, MD   5 mg at 08/21/20 0927  . bictegravir-emtricitabine-tenofovir AF (BIKTARVY) 50-200-25 MG per tablet 1 tablet  1 tablet Oral QHS Aletha Halim, MD   1 tablet at 08/21/20 2124  . bisacodyl (DULCOLAX) suppository 10 mg  10 mg Rectal Once Aletha Halim, MD      . citalopram (CELEXA) tablet 20 mg  20 mg Oral QHS Aletha Halim, MD   20 mg at 08/21/20 2123  . colestipol (COLESTID) tablet 1 g  1 g Oral Daily Aletha Halim, MD   1 g at 08/21/20 0165  . enoxaparin (LOVENOX) injection 65 mg  65 mg Subcutaneous Q24H Aletha Halim, MD   65 mg at 08/21/20 2123  . insulin aspart (novoLOG) injection 0-20 Units  0-20 Units Subcutaneous TID WC Aletha Halim, MD   4 Units at 08/21/20 1731  . insulin aspart (novoLOG) injection 0-5 Units  0-5 Units Subcutaneous QHS Aletha Halim, MD      . insulin aspart (novoLOG) injection 6 Units  6 Units Subcutaneous TID WC Aletha Halim, MD   6 Units at 08/21/20 1731  .  lisinopril (ZESTRIL) tablet 10 mg  10 mg Oral QHS Aletha Halim, MD   10 mg at 08/21/20 2125  . ondansetron (ZOFRAN) tablet 4 mg  4 mg Oral Q6H PRN Aletha Halim, MD   4 mg at 08/22/20 0744   Or  . ondansetron (ZOFRAN) injection 4 mg  4 mg Intravenous Q6H PRN Aletha Halim, MD      . oxyCODONE (Oxy IR/ROXICODONE) immediate release tablet 5-10 mg  5-10 mg Oral Q4H PRN Aletha Halim, MD   10 mg at 08/22/20 0744  . pantoprazole (PROTONIX) EC tablet 40 mg  40 mg Oral Daily Pickens, Charlie, MD      . polyethylene glycol (MIRALAX / GLYCOLAX) packet 17 g  17 g Oral Q supper Aletha Halim, MD   17 g at 08/21/20 1731  . simethicone (MYLICON) chewable tablet 80 mg  80 mg Oral TID Aletha Halim, MD   80 mg at 08/21/20 2131      Labs  Results for Cassandra, [name] (MRN [id number]) as of 08/22/2020 07:46  Ref. Range 08/21/2020 08:04 08/21/2020 12:16 08/21/2020 16:29 08/21/2020 19:54 08/22/2020 06:39  Glucose-Capillary Latest Ref Range: 70 - 99 mg/dL 156 (H) 158 (H) 131 (H) 136 (H) 150 (H)   Radiology No new imaging  Assessment & Plan:  Pt doing well *GYN: routine post op care. Will try dulcolax supp today. If able to pass flatus, then I think she will feel much and will be fine for d/c to home today. Continue with OOB. Follow up final pathology *HTN: continue home meds.  *DM2: pt eating but not back to baseline. Will continue to hold home januvia until taking regular PO well. Continue SSI with achs CBG checks *ID: continue home HAART *Pain: start PO PRNs *FEN/GI: DM2 diet. See above. *PPx: lovenox, SCDs, OOB ad lib *Dispo: hopefully later today.   Code Status: Full Code  Durene Romans MD Attending Center for [facility])

## 2020-08-27 ENCOUNTER — Other Ambulatory Visit: Payer: Self-pay

## 2020-08-27 ENCOUNTER — Ambulatory Visit (INDEPENDENT_AMBULATORY_CARE_PROVIDER_SITE_OTHER): Payer: BC Managed Care – PPO | Admitting: *Deleted

## 2020-08-27 DIAGNOSIS — Z90710 Acquired absence of both cervix and uterus: Secondary | ICD-10-CM

## 2020-08-27 LAB — SURGICAL PATHOLOGY

## 2020-08-27 NOTE — Progress Notes (Signed)
Subjective:     Cassandra [name] is a 45 y.o. female who presents to the clinic 1 weeks status post total abdominal hysterectomy.     Objective:    BP (!) 161/79   Pulse (!) 103  General:  alert     Incision:   no hernia, no seroma, no swelling, no dehiscence, incision well approximated, Prevena removed.     Assessment:    Doing well postoperatively.   Plan:    1. Continue any current medications. 2. Wound care discussed. 3. Follow up: at post op visit or as needed  Crosby Oyster, RN

## 2020-08-31 ENCOUNTER — Encounter: Payer: Self-pay | Admitting: Family Medicine

## 2020-09-26 ENCOUNTER — Ambulatory Visit (INDEPENDENT_AMBULATORY_CARE_PROVIDER_SITE_OTHER): Payer: BC Managed Care – PPO | Admitting: Obstetrics and Gynecology

## 2020-09-26 ENCOUNTER — Encounter: Payer: Self-pay | Admitting: Obstetrics and Gynecology

## 2020-09-26 ENCOUNTER — Other Ambulatory Visit: Payer: Self-pay

## 2020-09-26 VITALS — BP 142/93 | HR 92 | Ht 69.0 in | Wt 293.6 lb

## 2020-09-26 DIAGNOSIS — N870 Mild cervical dysplasia: Secondary | ICD-10-CM | POA: Insufficient documentation

## 2020-09-26 NOTE — Progress Notes (Signed)
Center for [facility] 09/26/2020  CC: regular post op visit  Subjective:    S/p 3/15 diagnostic laparoscopy converted to TAH/BS/LOA/cysto and prevena placement due to AUB and h/o failed ablation.  She was discharged to home on 3/17 and had a normal 3/22 prevena removal and wound check with a steri strip placed over th the left lateral edge of the incision.  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   Still some discomfort at the incision area but feeling well and no VB, discharge, itching.    Objective:    BP (!) 142/93   Pulse 92   Ht 5\' 9"  (1.753 m)   Wt 293 lb 9.6 oz (133.2 kg)   LMP 07/10/2020   BMI 43.36 kg/m   General:  alert  Abdomen: soft, bowel sounds active, non-tender  Incision:   healing well, no drainage, no erythema, no hernia, no seroma, no swelling, no dehiscence, incision well approximated. Steri strip removed     EGBUS normal Vagina: normal, no d/c of VB Cuff: normal, nttp, well approximated  Assessment:    Doing well postoperatively.    Plan:   Patient doing well. 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   RTC 1 year  Aletha Halim, Brooke Bonito MD Attending Center for [employer])

## 2020-10-14 ENCOUNTER — Encounter (INDEPENDENT_AMBULATORY_CARE_PROVIDER_SITE_OTHER): Payer: Self-pay

## 2020-10-19 ENCOUNTER — Other Ambulatory Visit: Payer: Self-pay | Admitting: Family Medicine

## 2020-10-20 ENCOUNTER — Other Ambulatory Visit: Payer: Self-pay | Admitting: Gastroenterology

## 2020-10-21 ENCOUNTER — Ambulatory Visit (INDEPENDENT_AMBULATORY_CARE_PROVIDER_SITE_OTHER): Payer: BC Managed Care – PPO | Admitting: Family Medicine

## 2020-10-21 ENCOUNTER — Other Ambulatory Visit: Payer: Self-pay

## 2020-10-21 ENCOUNTER — Encounter: Payer: Self-pay | Admitting: Family Medicine

## 2020-10-21 VITALS — BP 124/74 | HR 99 | Temp 97.5°F | Ht 69.0 in | Wt 291.6 lb

## 2020-10-21 DIAGNOSIS — Z6841 Body Mass Index (BMI) 40.0 and over, adult: Secondary | ICD-10-CM

## 2020-10-21 DIAGNOSIS — E118 Type 2 diabetes mellitus with unspecified complications: Secondary | ICD-10-CM

## 2020-10-21 NOTE — Progress Notes (Signed)
Patient ID: Cassandra [name], female    DOB: 1976-05-29, 45 y.o.   MRN: [id number]  This visit was conducted in person.  BP 124/74   Pulse 99   Temp (!) 97.5 F (36.4 C) (Temporal)   Ht 5\' 9"  (1.753 m)   Wt 291 lb 9 oz (132.3 kg)   LMP 07/10/2020   SpO2 97%   BMI 43.06 kg/m    CC: 3 mo DM f/u visit  Subjective:   HPI: Cassandra [name] is a 45 y.o. female presenting on 10/21/2020 for Diabetes (Here for  3 mo f/u. 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   DM - does regularly check sugars 100 fasting and 120-130 in evenings. Compliant with antihyperglycemic regimen which includes: ozempic 1mg  weekly last visit transitioned to [place] 50mg  given increase in cost of medication. Now back on ozempic for the past 3 wks. Denies low sugars or hypoglycemic symptoms. Denies paresthesias. Last diabetic eye exam DUE. Pneumovax: 2016. Prevnar: not due. Glucometer brand: accuchek. DSME: 07/2019. Lab Results  Component Value Date   HGBA1C 7.1 (H) 08/20/2020   Diabetic Foot Exam - Simple   No data filed    Lab Results  Component Value Date   MICROALBUR 1.3 12/27/2019         Relevant past medical, surgical, family and social history reviewed and updated as indicated. Interim medical history since our last visit reviewed. Allergies and medications reviewed and updated. Outpatient Medications Prior to Visit  Medication Sig Dispense Refill  . Accu-Chek FastClix Lancets MISC 1 each by Does not apply route as directed. Use as directed to check blood sugar twice daily as needed. E11.8 102 each 3  . acetaminophen (TYLENOL) 500 MG tablet Take 1,000 mg by mouth 2 (two) times daily as needed for moderate pain or headache.     Marland Kitchen  amLODipine (NORVASC) 5 MG tablet TAKE 1 TABLET BY MOUTH EVERY DAY (Patient taking differently: Take 5 mg by mouth at bedtime.) 90 tablet 3  . BIKTARVY 50-200-25 MG TABS tablet Take 1 tablet by mouth at bedtime. 30 tablet 5  . bisacodyl (DULCOLAX) 5 MG EC tablet Take 1 tablet (5 mg total) by mouth daily as needed for moderate constipation. 14 tablet 0  . citalopram (CELEXA) 20 MG tablet TAKE 1 TABLET BY MOUTH EVERY DAY (Patient taking differently: Take 20 mg by mouth at bedtime.) 90 tablet 3  . colestipol (COLESTID) 1 g tablet TAKE 2 TABLETS (2 G TOTAL) BY MOUTH 2 (TWO) TIMES DAILY. (Patient taking differently: Take 1 g by mouth daily.) 360 tablet 0  . cyclobenzaprine (FLEXERIL) 5 MG tablet TAKE 1 TABLET (5 MG TOTAL) BY MOUTH 2 (TWO) TIMES DAILY AS NEEDED (HEADACHE (SEDATION PRECAUTIONS)). 30 tablet 3  . diphenhydrAMINE (BENADRYL) 25 MG tablet Take 25 mg by mouth at bedtime as needed for allergies or sleep.    Marland Kitchen glucose blood (ACCU-CHEK GUIDE) test strip 1 each by Other route as needed for other. Use as instructed to check blood sugar 100 each 0  . lisinopril (ZESTRIL) 10 MG tablet TAKE 1 TABLET BY MOUTH EVERY DAY (Patient taking differently: Take 10 mg by mouth at bedtime.) 90 tablet 3  . loratadine (CLARITIN) 10  MG tablet Take 10 mg by mouth daily as needed for allergies.    . pantoprazole (PROTONIX) 40 MG tablet TAKE 1 TABLET BY MOUTH TWICE A DAY 180 tablet 0  . Peppermint Oil (IBGARD PO) Take 2 tablets by mouth daily as needed (diarrhea).    . simethicone (MYLICON) 80 MG chewable tablet Chew 1 tablet (80 mg total) by mouth 4 (four) times daily as needed for flatulence. 30 tablet 0  . topiramate (TOPAMAX) 25 MG tablet TAKE 1 TABLET BY MOUTH EVERYDAY AT BEDTIME (Patient taking differently: Take 25 mg by mouth daily as needed (headaches).) 90 tablet 2  . OZEMPIC, 1 MG/DOSE, 4 MG/3ML SOPN Inject 1 mg into the skin once a week.    Marland Kitchen oxyCODONE (OXY IR/ROXICODONE) 5 MG immediate release tablet Take 1-2  tablets (5-10 mg total) by mouth every 4 (four) hours as needed for moderate pain or severe pain (5mg  for pain score  4-6; 10mg  for  7-10). (Patient not taking: Reported on 09/26/2020) 30 tablet 0  . OZEMPIC, 1 MG/DOSE, 4 MG/3ML SOPN SMARTSIG:0.75 Milliliter(s) SUB-Q Once a Week    . sitaGLIPtin (JANUVIA) 50 MG tablet Take 1 tablet (50 mg total) by mouth daily. 30 tablet 3   No facility-administered medications prior to visit.     Per HPI unless specifically indicated in ROS section below Review of Systems Objective:  BP 124/74   Pulse 99   Temp (!) 97.5 F (36.4 C) (Temporal)   Ht 5\' 9"  (1.753 m)   Wt 291 lb 9 oz (132.3 kg)   LMP 07/10/2020   SpO2 97%   BMI 43.06 kg/m   Wt Readings from Last 3 Encounters:  10/21/20 291 lb 9 oz (132.3 kg)  09/26/20 293 lb 9.6 oz (133.2 kg)  08/20/20 295 lb (133.8 kg)      Physical Exam Vitals and nursing note reviewed.  Constitutional:      General: She is not in acute distress.    Appearance: Normal appearance. She is well-developed. She is obese. She is not ill-appearing.  HENT:     Head: Normocephalic and atraumatic.  Eyes:     General: No scleral icterus.    Extraocular Movements: Extraocular movements intact.     Conjunctiva/sclera: Conjunctivae normal.     Pupils: Pupils are equal, round, and reactive to light.  Cardiovascular:     Rate and Rhythm: Normal rate and regular rhythm.     Pulses: Normal pulses.     Heart sounds: Normal heart sounds. No murmur heard.   Pulmonary:     Effort: Pulmonary effort is normal. No respiratory distress.     Breath sounds: Normal breath sounds. No wheezing, rhonchi or rales.  Musculoskeletal:     Cervical back: Normal range of motion and neck supple.     Right lower leg: No edema.     Left lower leg: No edema.     Comments: See HPI for foot exam if done  Lymphadenopathy:     Cervical: No cervical adenopathy.  Skin:    General: Skin is warm and dry.     Findings: No rash.  Neurological:      Mental Status: She is alert.  Psychiatric:        Mood and Affect: Mood normal.        Behavior: Behavior normal.       Lab Results  Component Value Date   HGBA1C 7.1 (H) 08/20/2020    Assessment & Plan:  This visit occurred during  the SARS-CoV-2 public health emergency.  Safety protocols were in place, including screening questions prior to the visit, additional usage of staff PPE, and extensive cleaning of exam room while observing appropriate contact time as indicated for disinfecting solutions.   Problem List Items Addressed This Visit    Controlled diabetes mellitus type 2 with complications (HCC)    Chronic, stable. Back on ozempic, restarted at 1mg  dose. Continue current regimen. Congratulated on good control to date based on recall cbg's.       Relevant Medications   OZEMPIC, 1 MG/DOSE, 4 MG/3ML SOPN   Body mass index (BMI) of 40.0 to 44.9 in adult [facility])    Congratulated on ongoing weight loss to date - 60 lbs since 05/2019. Pt motivated to continue ongoing weight loss efforts. Continue ozempic full dose. Continue staying physically active through [employer].       Relevant Medications   OZEMPIC, 1 MG/DOSE, 4 MG/3ML SOPN       No orders of the defined types were placed in this encounter.  No orders of the defined types were placed in this encounter.   Patient Instructions  Call to schedule eye exam and send [place] report to update your chart.  You are doing well today - continue ozempic 1mg  weekly.  Increase water intake.  Return for physical over the summer - check with insurance to verify ok to get done early   Follow up plan: Return for annual exam, prior fasting for blood work.  Ria Bush, MD

## 2020-10-21 NOTE — Patient Instructions (Addendum)
Call to schedule eye exam and send [place] report to update your chart.  You are doing well today - continue ozempic 1mg  weekly.  Increase water intake.  Return for physical over the summer - check with insurance to verify ok to get done early

## 2020-10-21 NOTE — Assessment & Plan Note (Addendum)
Congratulated on ongoing weight loss to date - 60 lbs since 05/2019. Pt motivated to continue ongoing weight loss efforts. Continue ozempic full dose. Continue staying physically active through [employer].

## 2020-10-21 NOTE — Assessment & Plan Note (Signed)
Chronic, stable. Back on ozempic, restarted at 1mg  dose. Continue current regimen. Congratulated on good control to date based on recall cbg's.

## 2020-11-09 ENCOUNTER — Other Ambulatory Visit: Payer: Self-pay | Admitting: Family Medicine

## 2020-11-09 DIAGNOSIS — E538 Deficiency of other specified B group vitamins: Secondary | ICD-10-CM

## 2020-11-09 DIAGNOSIS — E118 Type 2 diabetes mellitus with unspecified complications: Secondary | ICD-10-CM

## 2020-11-09 DIAGNOSIS — K529 Noninfective gastroenteritis and colitis, unspecified: Secondary | ICD-10-CM

## 2020-11-12 ENCOUNTER — Other Ambulatory Visit: Payer: BC Managed Care – PPO

## 2020-11-14 ENCOUNTER — Other Ambulatory Visit: Payer: Self-pay

## 2020-11-14 ENCOUNTER — Other Ambulatory Visit (INDEPENDENT_AMBULATORY_CARE_PROVIDER_SITE_OTHER): Payer: BC Managed Care – PPO

## 2020-11-14 DIAGNOSIS — E538 Deficiency of other specified B group vitamins: Secondary | ICD-10-CM

## 2020-11-14 DIAGNOSIS — E118 Type 2 diabetes mellitus with unspecified complications: Secondary | ICD-10-CM | POA: Diagnosis not present

## 2020-11-14 DIAGNOSIS — K529 Noninfective gastroenteritis and colitis, unspecified: Secondary | ICD-10-CM | POA: Diagnosis not present

## 2020-11-14 LAB — COMPREHENSIVE METABOLIC PANEL
ALT: 22 U/L (ref 0–35)
AST: 21 U/L (ref 0–37)
Albumin: 3.8 g/dL (ref 3.5–5.2)
Alkaline Phosphatase: 79 U/L (ref 39–117)
BUN: 8 mg/dL (ref 6–23)
CO2: 30 mEq/L (ref 19–32)
Calcium: 9 mg/dL (ref 8.4–10.5)
Chloride: 103 mEq/L (ref 96–112)
Creatinine, Ser: 0.99 mg/dL (ref 0.40–1.20)
GFR: 69.92 mL/min (ref >60.00)
Glucose, Bld: 111 mg/dL — ABNORMAL HIGH (ref 70–99)
Potassium: 3.7 mEq/L (ref 3.5–5.1)
Sodium: 139 mEq/L (ref 135–145)
Total Bilirubin: 0.7 mg/dL (ref 0.2–1.2)
Total Protein: 6.9 g/dL (ref 6.0–8.3)

## 2020-11-14 LAB — CBC WITH DIFFERENTIAL/PLATELET
Basophils Absolute: 0.1 10*3/uL (ref 0.0–0.1)
Basophils Relative: 0.9 % (ref 0.0–3.0)
Eosinophils Absolute: 0.2 10*3/uL (ref 0.0–0.7)
Eosinophils Relative: 2 % (ref 0.0–5.0)
HCT: 39.6 % (ref 36.0–46.0)
Hemoglobin: 13.1 g/dL (ref 12.0–15.0)
Lymphocytes Relative: 31.7 % (ref 12.0–46.0)
Lymphs Abs: 2.5 10*3/uL (ref 0.7–4.0)
MCHC: 33.2 g/dL (ref 30.0–36.0)
MCV: 96.3 fl (ref 78.0–100.0)
Monocytes Absolute: 0.5 10*3/uL (ref 0.1–1.0)
Monocytes Relative: 6.2 % (ref 3.0–12.0)
Neutro Abs: 4.6 10*3/uL (ref 1.4–7.7)
Neutrophils Relative %: 59.2 % (ref 43.0–77.0)
Platelets: 310 10*3/uL (ref 150.0–400.0)
RBC: 4.12 Mil/uL (ref 3.87–5.11)
RDW: 12.8 % (ref 11.5–15.5)
WBC: 7.8 10*3/uL (ref 4.0–10.5)

## 2020-11-14 LAB — LIPID PANEL
Cholesterol: 157 mg/dL (ref 0–200)
HDL: 38.5 mg/dL — ABNORMAL LOW (ref >39.00)
LDL Cholesterol: 96 mg/dL (ref 0–99)
NonHDL: 118.76
Total CHOL/HDL Ratio: 4
Triglycerides: 116 mg/dL (ref 0.0–149.0)
VLDL: 23.2 mg/dL (ref 0.0–40.0)

## 2020-11-14 LAB — HEMOGLOBIN A1C: Hgb A1c MFr Bld: 5.9 % (ref 4.6–6.5)

## 2020-11-14 LAB — VITAMIN B12: Vitamin B-12: 166 pg/mL — ABNORMAL LOW (ref 211–911)

## 2020-11-19 ENCOUNTER — Encounter: Payer: Self-pay | Admitting: Infectious Diseases

## 2020-11-19 ENCOUNTER — Other Ambulatory Visit: Payer: Self-pay

## 2020-11-19 ENCOUNTER — Other Ambulatory Visit
Admission: RE | Admit: 2020-11-19 | Discharge: 2020-11-19 | Disposition: A | Discharge status: Home / Self Care | Payer: BC Managed Care – PPO | Source: Ambulatory Visit | Attending: Infectious Diseases | Admitting: Infectious Diseases

## 2020-11-19 ENCOUNTER — Encounter: Payer: Self-pay | Admitting: Family Medicine

## 2020-11-19 ENCOUNTER — Ambulatory Visit: Payer: BC Managed Care – PPO | Attending: Infectious Diseases | Admitting: Infectious Diseases

## 2020-11-19 ENCOUNTER — Ambulatory Visit (INDEPENDENT_AMBULATORY_CARE_PROVIDER_SITE_OTHER): Payer: BC Managed Care – PPO | Admitting: Family Medicine

## 2020-11-19 VITALS — BP 107/76 | HR 89 | Resp 16 | Ht 69.0 in | Wt 286.0 lb

## 2020-11-19 VITALS — BP 126/70 | HR 99 | Temp 97.9°F | Ht 67.75 in | Wt 286.1 lb

## 2020-11-19 DIAGNOSIS — K529 Noninfective gastroenteritis and colitis, unspecified: Secondary | ICD-10-CM

## 2020-11-19 DIAGNOSIS — K219 Gastro-esophageal reflux disease without esophagitis: Secondary | ICD-10-CM

## 2020-11-19 DIAGNOSIS — E118 Type 2 diabetes mellitus with unspecified complications: Secondary | ICD-10-CM | POA: Diagnosis not present

## 2020-11-19 DIAGNOSIS — I10 Essential (primary) hypertension: Secondary | ICD-10-CM | POA: Diagnosis not present

## 2020-11-19 DIAGNOSIS — B20 Human immunodeficiency virus [HIV] disease: Secondary | ICD-10-CM | POA: Diagnosis not present

## 2020-11-19 DIAGNOSIS — E538 Deficiency of other specified B group vitamins: Secondary | ICD-10-CM | POA: Insufficient documentation

## 2020-11-19 DIAGNOSIS — F331 Major depressive disorder, recurrent, moderate: Secondary | ICD-10-CM

## 2020-11-19 DIAGNOSIS — Z79899 Other long term (current) drug therapy: Secondary | ICD-10-CM | POA: Diagnosis not present

## 2020-11-19 DIAGNOSIS — Z0000 Encounter for general adult medical examination without abnormal findings: Secondary | ICD-10-CM

## 2020-11-19 DIAGNOSIS — F17200 Nicotine dependence, unspecified, uncomplicated: Secondary | ICD-10-CM | POA: Insufficient documentation

## 2020-11-19 DIAGNOSIS — Z7901 Long term (current) use of anticoagulants: Secondary | ICD-10-CM | POA: Insufficient documentation

## 2020-11-19 DIAGNOSIS — E119 Type 2 diabetes mellitus without complications: Secondary | ICD-10-CM | POA: Diagnosis not present

## 2020-11-19 DIAGNOSIS — Z21 Asymptomatic human immunodeficiency virus [HIV] infection status: Secondary | ICD-10-CM

## 2020-11-19 DIAGNOSIS — Z6841 Body Mass Index (BMI) 40.0 and over, adult: Secondary | ICD-10-CM

## 2020-11-19 MED ORDER — CYANOCOBALAMIN 1000 MCG/ML IJ SOLN
1000.0000 ug | Freq: Once | INTRAMUSCULAR | Status: AC
  Administered 2020-11-19: 1000 ug via INTRAMUSCULAR

## 2020-11-19 NOTE — Patient Instructions (Addendum)
B12 shot today and then schedule weekly shots for 1 month then monthly for 6 months then recheck levels.  Call to schedule mammogram at your convenience: at [facility]. Schedule eye exam if you're due - ask them to send me report of latest diabetic eye exam.  Good to see you today. Return as needed or in 6 months for diabetes follow up visit.  Health Maintenance, Female Adopting a healthy lifestyle and getting preventive care are important in promoting health and wellness. Ask your health care provider about: The right schedule for you to have regular tests and exams. Things you can do on your own to prevent diseases and keep yourself healthy. What should I know about diet, weight, and exercise? Eat a healthy diet  Eat a diet that includes plenty of vegetables, fruits, low-fat dairy products, and lean protein. Do not eat a lot of foods that are high in solid fats, added sugars, or sodium.  Maintain a healthy weight Body mass index (BMI) is used to identify weight problems. It estimates body fat based on height and weight. Your health care provider can help determineyour BMI and help you achieve or maintain a healthy weight. Get regular exercise Get regular exercise. This is one of the most important things you can do for your health. Most adults should: Exercise for at least 150 minutes each week. The exercise should increase your heart rate and make you sweat (moderate-intensity exercise). Do strengthening exercises at least twice a week. This is in addition to the moderate-intensity exercise. Spend less time sitting. Even light physical activity can be beneficial. Watch cholesterol and blood lipids Have your blood tested for lipids and cholesterol at 43 years of age, then havethis test every 5 years. Have your cholesterol levels checked more often if: Your lipid or cholesterol levels are high. You are older than 43 years of age. You are at high risk for heart disease. What should  I know about cancer screening? Depending on your health history and family history, you may need to have cancer screening at various ages. This may include screening for: Breast cancer. Cervical cancer. Colorectal cancer. Skin cancer. Lung cancer. What should I know about heart disease, diabetes, and high blood pressure? Blood pressure and heart disease High blood pressure causes heart disease and increases the risk of stroke. This is more likely to develop in people who have high blood pressure readings, are of African descent, or are overweight. Have your blood pressure checked: Every 3-5 years if you are 39-56 years of age. Every year if you are 27 years old or older. Diabetes Have regular diabetes screenings. This checks your fasting blood sugar level. Have the screening done: Once every three years after age 58 if you are at a normal weight and have a low risk for diabetes. More often and at a younger age if you are overweight or have a high risk for diabetes. What should I know about preventing infection? Hepatitis B If you have a higher risk for hepatitis B, you should be screened for this virus. Talk with your health care provider to find out if you are at risk forhepatitis B infection. Hepatitis C Testing is recommended for: Everyone born from [birth year] through 1963. Anyone with known risk factors for hepatitis C. Sexually transmitted infections (STIs) Get screened for STIs, including gonorrhea and chlamydia, if: You are sexually active and are younger than 43 years of age. You are older than 43 years of age and your health care provider  tells you that you are at risk for this type of infection. Your sexual activity has changed since you were last screened, and you are at increased risk for chlamydia or gonorrhea. Ask your health care provider if you are at risk. Ask your health care provider about whether you are at high risk for HIV. Your health care provider may recommend a  prescription medicine to help prevent HIV infection. If you choose to take medicine to prevent HIV, you should first get tested for HIV. You should then be tested every 3 months for as long as you are taking the medicine. Pregnancy If you are about to stop having your period (premenopausal) and you may become pregnant, seek counseling before you get pregnant. Take 400 to 800 micrograms (mcg) of folic acid every day if you become pregnant. Ask for birth control (contraception) if you want to prevent pregnancy. Osteoporosis and menopause Osteoporosis is a disease in which the bones lose minerals and strength with aging. This can result in bone fractures. If you are 70 years old or older, or if you are at risk for osteoporosis and fractures, ask your health care provider if you should: Be screened for bone loss. Take a calcium or vitamin D supplement to lower your risk of fractures. Be given hormone replacement therapy (HRT) to treat symptoms of menopause. Follow these instructions at home: Lifestyle Do not use any products that contain nicotine or tobacco, such as cigarettes, e-cigarettes, and chewing tobacco. If you need help quitting, ask your health care provider. Do not use street drugs. Do not share needles. Ask your health care provider for help if you need support or information about quitting drugs. Alcohol use Do not drink alcohol if: Your health care provider tells you not to drink. You are pregnant, may be pregnant, or are planning to become pregnant. If you drink alcohol: Limit how much you use to 0-1 drink a day. Limit intake if you are breastfeeding. Be aware of how much alcohol is in your drink. In the [place] one drink equals one 12 oz bottle of beer (355 mL), one 5 oz glass of wine (148 mL), or one 1 oz glass of hard liquor (44 mL). General instructions Schedule regular health, dental, and eye exams. Stay current with your vaccines. Tell your health care provider if: You  often feel depressed. You have ever been abused or do not feel safe at home. Summary Adopting a healthy lifestyle and getting preventive care are important in promoting health and wellness. Follow your health care provider's instructions about healthy diet, exercising, and getting tested or screened for diseases. Follow your health care provider's instructions on monitoring your cholesterol and blood pressure. This information is not intended to replace advice given to you by your health care provider. Make sure you discuss any questions you have with your healthcare provider. Document Revised: 05/18/2018 Document Reviewed: 05/18/2018 Elsevier Patient Education  2022 [employer].

## 2020-11-19 NOTE — Patient Instructions (Signed)
You are here for follow up- On Biktarvy- doing well- labs- follow up 9 months

## 2020-11-19 NOTE — Progress Notes (Signed)
Patient ID: Cassandra [name], female    DOB: [birth year], 45 y.o.   MRN: [id number]  This visit was conducted in person.  BP 126/70   Pulse 99   Temp 97.9 F (36.6 C) (Temporal)   Ht 5' 7.75" (1.721 m)   Wt 286 lb 2 oz (129.8 kg)   LMP 07/10/2020   SpO2 96%   BMI 43.83 kg/m    CC: CPE Subjective:   HPI: Cassandra [name] is a 45 y.o. female presenting on 11/19/2020 for Annual Exam   Saw ID earlier today, good report.  Ongoing weight loss noted on ozempic.  Needed physical prior to summer camp.  [place].   B12 low despite daily b12 liquid vitamin   Preventative: COLONOSCOPY WITH PROPOFOL 07/04/2018 for chronic diarrhea after cholecystectomy started on colestipol with benefit ([place]). Thought she has IBS-D. 2 TA, rpt 5 yrs  Well woman with Dr. Ilda Basset - s/p hysterectomy 08/2019, pap obtained during hysterectomy CIN1 - rec pap next year and screening for 20 yrs.  Mammogram 12/2018 Birads1 @ [facility]. Will call and schedule rpt Flu yearly COVID vaccine Moderna 05/2020, 06/2020 Tdap 12/2012 Pneumovax23 2016  Seat belt use discussed   Sunscreen use discussed, no changing moles on skin.  Ex smoker - 2017, intermittent smoking  Alcohol - rare  Dentist - overdue Eye exam - yearly  Lives with husband and 2 children, no pets Occupation: call center rep Edu: some college Activity: walks around building. Diet: 1 mt dew in am, rest water, fruits/vegetables daily, bringing food from home     Relevant past medical, surgical, family and social history reviewed and updated as indicated. Interim medical history since our last visit reviewed. Allergies and medications reviewed and updated. Outpatient Medications Prior to Visit  Medication Sig Dispense Refill   Accu-Chek FastClix Lancets MISC 1 each by Does not apply route as directed. Use as directed to check blood sugar twice daily as needed. E11.8 102 each 3   acetaminophen  (TYLENOL) 500 MG tablet Take 1,000 mg by mouth 2 (two) times daily as needed for moderate pain or headache.      amLODipine (NORVASC) 5 MG tablet TAKE 1 TABLET BY MOUTH EVERY DAY (Patient taking differently: Take 5 mg by mouth at bedtime.) 90 tablet 3   BIKTARVY 50-200-25 MG TABS tablet Take 1 tablet by mouth at bedtime. 30 tablet 5   bisacodyl (DULCOLAX) 5 MG EC tablet Take 1 tablet (5 mg total) by mouth daily as needed for moderate constipation. 14 tablet 0   citalopram (CELEXA) 20 MG tablet TAKE 1 TABLET BY MOUTH EVERY DAY 90 tablet 3   colestipol (COLESTID) 1 g tablet TAKE 2 TABLETS (2 G TOTAL) BY MOUTH 2 (TWO) TIMES DAILY. (Patient taking differently: Take 1 g by mouth daily.) 360 tablet 0   cyclobenzaprine (FLEXERIL) 5 MG tablet TAKE 1 TABLET (5 MG TOTAL) BY MOUTH 2 (TWO) TIMES DAILY AS NEEDED (HEADACHE (SEDATION PRECAUTIONS)). 30 tablet 3   diphenhydrAMINE (BENADRYL) 25 MG tablet Take 25 mg by mouth at bedtime as needed for allergies or sleep.     glucose blood (ACCU-CHEK GUIDE) test strip 1 each by Other route as needed for other. Use as instructed to check blood sugar 100 each 0   lisinopril (ZESTRIL) 10 MG tablet TAKE 1 TABLET BY MOUTH EVERY DAY (Patient taking differently: Take 10 mg by mouth at bedtime.) 90 tablet 3   loratadine (CLARITIN) 10  MG tablet Take 10 mg by mouth daily as needed for allergies.     OZEMPIC, 1 MG/DOSE, 4 MG/3ML SOPN Inject 1 mg into the skin once a week.     pantoprazole (PROTONIX) 40 MG tablet TAKE 1 TABLET BY MOUTH TWICE A DAY 180 tablet 0   Peppermint Oil (IBGARD PO) Take 2 tablets by mouth daily as needed (diarrhea).     simethicone (MYLICON) 80 MG chewable tablet Chew 1 tablet (80 mg total) by mouth 4 (four) times daily as needed for flatulence. 30 tablet 0   No facility-administered medications prior to visit.     Per HPI unless specifically indicated in ROS section below Review of Systems  Constitutional:  Negative for activity change, appetite change,  chills, fatigue, fever and unexpected weight change.  HENT:  Negative for hearing loss.   Eyes:  Negative for visual disturbance.  Respiratory:  Negative for cough, chest tightness, shortness of breath and wheezing.   Cardiovascular:  Negative for chest pain, palpitations and leg swelling.  Gastrointestinal:  Positive for diarrhea (chronic, controlled on colestipol). Negative for abdominal distention, abdominal pain, blood in stool, constipation, nausea and vomiting.  Genitourinary:  Negative for difficulty urinating and hematuria.  Musculoskeletal:  Negative for arthralgias, myalgias and neck pain.  Skin:  Negative for rash.  Neurological:  Negative for dizziness, seizures, syncope and headaches.  Hematological:  Negative for adenopathy. Does not bruise/bleed easily.  Psychiatric/Behavioral:  Positive for dysphoric mood (stable on current medicines). The patient is not nervous/anxious.   Objective:  BP 126/70   Pulse 99   Temp 97.9 F (36.6 C) (Temporal)   Ht 5' 7.75" (1.721 m)   Wt 286 lb 2 oz (129.8 kg)   LMP 07/10/2020   SpO2 96%   BMI 43.83 kg/m   Wt Readings from Last 3 Encounters:  11/19/20 286 lb 2 oz (129.8 kg)  11/19/20 286 lb (129.7 kg)  10/21/20 291 lb 9 oz (132.3 kg)      Physical Exam Vitals and nursing note reviewed.  Constitutional:      Appearance: Normal appearance. She is obese. She is not ill-appearing.  HENT:     Head: Normocephalic and atraumatic.     Right Ear: Tympanic membrane, ear canal and external ear normal. There is no impacted cerumen.     Left Ear: Tympanic membrane, ear canal and external ear normal. There is no impacted cerumen.  Eyes:     General:        Right eye: No discharge.        Left eye: No discharge.     Extraocular Movements: Extraocular movements intact.     Conjunctiva/sclera: Conjunctivae normal.     Pupils: Pupils are equal, round, and reactive to light.  Neck:     Thyroid: No thyroid mass or thyromegaly.  Cardiovascular:      Rate and Rhythm: Normal rate and regular rhythm.     Pulses: Normal pulses.     Heart sounds: Normal heart sounds. No murmur heard. Pulmonary:     Effort: Pulmonary effort is normal. No respiratory distress.     Breath sounds: Normal breath sounds. No wheezing, rhonchi or rales.  Abdominal:     General: Bowel sounds are normal. There is no distension.     Palpations: Abdomen is soft. There is no mass.     Tenderness: There is no abdominal tenderness. There is no guarding or rebound.     Hernia: No hernia is present.  Musculoskeletal:  Cervical back: Normal range of motion and neck supple. No rigidity.     Right lower leg: No edema.     Left lower leg: No edema.  Lymphadenopathy:     Cervical: No cervical adenopathy.  Skin:    General: Skin is warm and dry.     Findings: No rash.  Neurological:     General: No focal deficit present.     Mental Status: She is alert. Mental status is at baseline.  Psychiatric:        Mood and Affect: Mood normal.        Behavior: Behavior normal.      Results for orders placed or performed in visit on 11/14/20  CBC with Differential/Platelet  Result Value Ref Range   WBC 7.8 4.0 - 10.5 K/uL   RBC 4.12 3.87 - 5.11 Mil/uL   Hemoglobin 13.1 12.0 - 15.0 g/dL   HCT 39.6 36.0 - 46.0 %   MCV 96.3 78.0 - 100.0 fl   MCHC 33.2 30.0 - 36.0 g/dL   RDW 12.8 11.5 - 15.5 %   Platelets 310.0 150.0 - 400.0 K/uL   Neutrophils Relative % 59.2 43.0 - 77.0 %   Lymphocytes Relative 31.7 12.0 - 46.0 %   Monocytes Relative 6.2 3.0 - 12.0 %   Eosinophils Relative 2.0 0.0 - 5.0 %   Basophils Relative 0.9 0.0 - 3.0 %   Neutro Abs 4.6 1.4 - 7.7 K/uL   Lymphs Abs 2.5 0.7 - 4.0 K/uL   Monocytes Absolute 0.5 0.1 - 1.0 K/uL   Eosinophils Absolute 0.2 0.0 - 0.7 K/uL   Basophils Absolute 0.1 0.0 - 0.1 K/uL  Vitamin B12  Result Value Ref Range   Vitamin B-12 166 (L) 211 - 911 pg/mL  Hemoglobin A1c  Result Value Ref Range   Hgb A1c MFr Bld 5.9 4.6 - 6.5 %   Comprehensive metabolic panel  Result Value Ref Range   Sodium 139 135 - 145 mEq/L   Potassium 3.7 3.5 - 5.1 mEq/L   Chloride 103 96 - 112 mEq/L   CO2 30 19 - 32 mEq/L   Glucose, Bld 111 (H) 70 - 99 mg/dL   BUN 8 6 - 23 mg/dL   Creatinine, Ser 0.99 0.40 - 1.20 mg/dL   Total Bilirubin 0.7 0.2 - 1.2 mg/dL   Alkaline Phosphatase 79 39 - 117 U/L   AST 21 0 - 37 U/L   ALT 22 0 - 35 U/L   Total Protein 6.9 6.0 - 8.3 g/dL   Albumin 3.8 3.5 - 5.2 g/dL   GFR 69.92 >60.00 mL/min   Calcium 9.0 8.4 - 10.5 mg/dL  Lipid panel  Result Value Ref Range   Cholesterol 157 0 - 200 mg/dL   Triglycerides 116.0 0.0 - 149.0 mg/dL   HDL 38.50 (L) >39.00 mg/dL   VLDL 23.2 0.0 - 40.0 mg/dL   LDL Cholesterol 96 0 - 99 mg/dL   Total CHOL/HDL Ratio 4    NonHDL 118.76    Depression screen [facility] 2/9 11/19/2020 01/16/2020 01/16/2020 07/31/2019 06/30/2019  Decreased Interest 1 1 0 1 2  Down, Depressed, Hopeless 1 1 1  0 2  PHQ - 2 Score 2 2 1 1 4   Altered sleeping 1 2 - - 3  Tired, decreased energy 1 3 - - 2  Change in appetite 0 2 - - 3  Feeling bad or failure about yourself  0 1 - - 2  Trouble concentrating 0 1 - -  3  Moving slowly or fidgety/restless 0 0 - - 2  Suicidal thoughts 0 0 - - 1  PHQ-9 Score 4 11 - - 20    GAD 7 : Generalized Anxiety Score 11/19/2020 01/16/2020 06/30/2019 12/03/2017  Nervous, Anxious, on Edge 1 1 3 2   Control/stop worrying 1 0 3 1  Worry too much - different things 0 1 3 2   Trouble relaxing 0 1 3 3   Restless 0 0 3 1  Easily annoyed or irritable 1 1 3 2   Afraid - awful might happen 0 1 3 2   Total GAD 7 Score 3 5 21 13    Assessment & Plan:  This visit occurred during the SARS-CoV-2 public health emergency.  Safety protocols were in place, including screening questions prior to the visit, additional usage of staff PPE, and extensive cleaning of exam room while observing appropriate contact time as indicated for disinfecting solutions.   Problem List Items Addressed This Visit      Controlled diabetes mellitus type 2 with complications ([place])    Great control on ozempic! Congratulated. Continue this.        MDD (major depressive disorder), recurrent episode, moderate (H[facility])    Chronic, stable period on celexa - continue.        Body mass index (BMI) of 40.0 to 44.9 in adult [facility])    Continued weight loss noted - congratulated!        HIV (human immunodeficiency virus infection) ([place])    Saw ID this morning - stable period under good control.        Hypertension    Chronic, stable. Continue current regimen.        Health maintenance examination - Primary    Preventative protocols reviewed and updated unless pt declined. Discussed healthy diet and lifestyle.        Chronic diarrhea    Stable period on colestipol.        GERD (gastroesophageal reflux disease)    Continues PPI BID through GI.        Vitamin B12 deficiency    Markedly low - ?PPI related - provided with B12 shot today then recommend weekly B12 for 1 month then monthly for 6 months and reassess, along with IF Ab levels next labwork.          Meds ordered this encounter  Medications   cyanocobalamin ((VITAMIN B-12)) injection 1,000 mcg    No orders of the defined types were placed in this encounter.   Patient instructions: B12 shot today and then schedule weekly shots for 1 month then monthly for 6 months then recheck levels.  Call to schedule mammogram at your convenience: at [facility]. Schedule eye exam if you're due - ask them to send me report of latest diabetic eye exam.  Good to see you today. Return as needed or in 6 months for diabetes follow up visit.   Follow up plan: Return in about 6 months (around 05/21/2021), or if symptoms worsen or fail to improve, for follow up visit.  Ria Bush, MD

## 2020-11-19 NOTE — Assessment & Plan Note (Signed)
Preventative protocols reviewed and updated unless pt declined. Discussed healthy diet and lifestyle.  

## 2020-11-19 NOTE — Assessment & Plan Note (Signed)
Chronic, stable period on celexa - continue.

## 2020-11-19 NOTE — Assessment & Plan Note (Signed)
Markedly low - ?PPI related - provided with B12 shot today then recommend weekly B12 for 1 month then monthly for 6 months and reassess, along with IF Ab levels next labwork.

## 2020-11-19 NOTE — Assessment & Plan Note (Signed)
Chronic, stable. Continue current regimen. 

## 2020-11-19 NOTE — Assessment & Plan Note (Signed)
Continued weight loss noted - congratulated!

## 2020-11-19 NOTE — Progress Notes (Signed)
NAME: Cassandra [name]  DOB: 29-Jun-1975  MRN: [id number]  Date/Time: 11/19/2020 9:58 AM   Subjective:  Here for follow up of HIV Here with her partner who is also positive and a patient of mine She is 100% adherent to HAART-on Biktarvy Last Vl < 20 and Cd4 1567 on 04/02/20 Weight loss on ozempic [id number] Had hysterectomy/b spalpingectomy in [place] 2022 Doing well No side effects from [place] history HIV diagnosed 2013 Nad[place] Cd4 >200 OI none HAARt history: first regimen Atripla 2nd regimen Biktarvy since Sept 2018  Acquired thru heterosexual contact. Husband HIV positive and on Rx   Past medical history Allergies to animal dander Gestational diabetes mellitus Diabetes mellitus GERD Hypertension Obesity Varicose veins Migraine Endometrial ablation for menorrhagia Hysterectomy Gall stones Depression  Tubular adenoma colon.  Colonoscopy done in January 2020.   Past Surgical History:  Procedure Laterality Date   BIOPSY  07/04/2018   Procedure: BIOPSY;  Surgeon: Thornton Park, MD;  Location: [facility];  Service: Gastroenterology;;   CESAREAN SECTION  [phone]   x2   CHOLECYSTECTOMY  2018   COLONOSCOPY WITH PROPOFOL N/A 07/04/2018   for chronic diarrhea after cholecystectomy started on colestipol with benefit ([place]). Thought she has IBS-D. 2 TA, rpt 5 yrs   CYSTOSCOPY N/A 08/20/2020   Procedure: CYSTOSCOPY;  Surgeon: Aletha Halim, MD;  Location: [facility];  Service: Gynecology;  Laterality: N/A;   ENDOMETRIAL BIOPSY  01/25/2020       HYSTERECTOMY ABDOMINAL WITH SALPINGECTOMY Bilateral 08/20/2020   done for heavy bleed and pelvic pain - pathology showed CIN1 of cervix Ilda Basset, Eduard Clos, MD)   HYSTEROSCOPY WITH D & C N/A 01/05/2018   [place] for heavy bleeding, IUD removed - DILATATION AND CURETTAGE /HYSTEROSCOPY WITH MINERVA ABLATION; [name], MD   INTRAUTERINE DEVICE INSERTION     Mirena   POLYPECTOMY  07/04/2018   Procedure: POLYPECTOMY;   Surgeon: Thornton Park, MD;  Location: [facility];  Service: Gastroenterology;;   TOTAL LAPAROSCOPIC HYSTERECTOMY WITH SALPINGECTOMY Bilateral 08/20/2020   Procedure: ATTEMPTED TOTAL LAPAROSCOPIC HYSTERECTOMY WITH SALPINGECTOMY;  Surgeon: Aletha Halim, MD;  Location: [facility];  Service: Gynecology;  Laterality: Bilateral;   TUBAL LIGATION     WISDOM TOOTH EXTRACTION     x 4     SH Current smoker Occasional alcohol Lives with her husband [name] female partner  and 2 sons    Family History  Problem Relation Age of Onset   Hypertension Mother    Hypertension Father    Appendicitis Father    Atrial fibrillation Father    Diabetes Maternal Grandmother        s/p amputation   Hyperlipidemia Maternal Grandmother    Hypertension Maternal Grandmother    Stroke Maternal Grandmother    Hyperlipidemia Maternal Grandfather    Hypertension Maternal Grandfather    Skin cancer Maternal Grandfather    Diabetes Maternal Grandfather    Seizures Paternal Grandmother 28       deceased   Coronary artery disease Paternal Grandfather 74       MI   Breast cancer Neg Hx    Colon cancer Neg Hx    Colon polyps Neg Hx    Esophageal cancer Neg Hx    Rectal cancer Neg Hx    Stomach cancer Neg Hx    Allergies  Allergen Reactions   Metformin And Related Diarrhea   ? Current Outpatient Medications  Medication Sig Dispense Refill   Accu-Chek FastClix Lancets MISC 1 each by Does  not apply route as directed. Use as directed to check blood sugar twice daily as needed. E11.8 102 each 3   acetaminophen (TYLENOL) 500 MG tablet Take 1,000 mg by mouth 2 (two) times daily as needed for moderate pain or headache.      amLODipine (NORVASC) 5 MG tablet TAKE 1 TABLET BY MOUTH EVERY DAY (Patient taking differently: Take 5 mg by mouth at bedtime.) 90 tablet 3   BIKTARVY 50-200-25 MG TABS tablet Take 1 tablet by mouth at bedtime. 30 tablet 5   bisacodyl (DULCOLAX) 5 MG EC tablet Take 1 tablet (5 mg total) by mouth  daily as needed for moderate constipation. 14 tablet 0   citalopram (CELEXA) 20 MG tablet TAKE 1 TABLET BY MOUTH EVERY DAY 90 tablet 3   colestipol (COLESTID) 1 g tablet TAKE 2 TABLETS (2 G TOTAL) BY MOUTH 2 (TWO) TIMES DAILY. (Patient taking differently: Take 1 g by mouth daily.) 360 tablet 0   cyclobenzaprine (FLEXERIL) 5 MG tablet TAKE 1 TABLET (5 MG TOTAL) BY MOUTH 2 (TWO) TIMES DAILY AS NEEDED (HEADACHE (SEDATION PRECAUTIONS)). 30 tablet 3   diphenhydrAMINE (BENADRYL) 25 MG tablet Take 25 mg by mouth at bedtime as needed for allergies or sleep.     glucose blood (ACCU-CHEK GUIDE) test strip 1 each by Other route as needed for other. Use as instructed to check blood sugar 100 each 0   lisinopril (ZESTRIL) 10 MG tablet TAKE 1 TABLET BY MOUTH EVERY DAY (Patient taking differently: Take 10 mg by mouth at bedtime.) 90 tablet 3   loratadine (CLARITIN) 10 MG tablet Take 10 mg by mouth daily as needed for allergies.     OZEMPIC, 1 MG/DOSE, 4 MG/3ML SOPN Inject 1 mg into the skin once a week.     pantoprazole (PROTONIX) 40 MG tablet TAKE 1 TABLET BY MOUTH TWICE A DAY 180 tablet 0   Peppermint Oil (IBGARD PO) Take 2 tablets by mouth daily as needed (diarrhea).     simethicone (MYLICON) 80 MG chewable tablet Chew 1 tablet (80 mg total) by mouth 4 (four) times daily as needed for flatulence. 30 tablet 0   No current facility-administered medications for this visit.    REVIEW OF SYSTEMS:  Const: negative fever, negative chills, 30 pound weight loss Eyes: negative diplopia or visual changes, negative eye pain ENT: negative coryza, negative sore throat Resp: negative cough, hemoptysis, dyspnea Cards: negative for chest pain, palpitations, lower extremity edema GU: negative for frequency, dysuria and hematuria Skin: negative for rash and pruritus Heme: negative for easy bruising and gum/nose bleeding MS: negative for myalgias, arthralgias, back pain and muscle weakness Neurolo:headaches much better  dizziness, NO vertigo, memory problems  Psych:stress   Objective:  VITALS:  BP 107/76   Pulse 89   Resp 16   Ht 5\' 9"  (1.753 m)   Wt 286 lb (129.7 kg)   LMP 07/10/2020   SpO2 98%   BMI 42.23 kg/m   Weight today is 306 PHYSICAL EXAM:  General:well Oral cavity no lesions Neck supple No lymphadenopathy Lungs: Clear to auscultation bilaterally. No Wheezing or Rhonchi. No rales. Heart: Regular rate and rhythm, no murmur, rub or gallop. Abdomen: Soft, non-tender,not distended. Bowel sounds normal. No masses Scar healed well Extremities: Extremities normal, atraumatic, no cyanosis. No edema. No clubbing Skin: No rashes or lesions. Not Jaundiced Lymph: Cervical, supraclavicular normal. Neurologic: Grossly non-focal Pertinent Labs Health maintenance:  Vaccination  Vaccine Date last given comment  Influenza 05/18/19   Hepatitis B  3/10, 5/19 and 02/16/19   Hepatitis A 08/16/18 and 02/16/19   Prevnar-PCV-13    Pneumovac-PPSV-23 01/04/15   TdaP 12/14/12   HPV    Shingrix ( zoster vaccine)     ______________________  Labs Lab Result  Date comment  HIV VL  <20    CD4 1263 03/2020   Genotype     [id number] Neg 08/16/2018   HIV antibody     RPR  nonreactive 11/16/19   Quantiferon Gold  negative 11/16/19   Hep C ab Neg 03/30/18   Hepatitis B-ab,ag,c Sag-Neg, Sab neg Cab neg 03/30/18   Hepatitis A-IgM, IgG /T Neg 03/30/18   Lipid TC 170, HDL 41, LDL 108 TGL 99 11/26/17   GC/CHL neg 01/25/20   PAP High risk HPV + 01/25/20          Preventive  Procedure Result  Date comment  colonoscopy Tubular adenoma 07/03/18   Mammogram Normal 11/26/17   Dental exam     Opthal       Impression/Recommendation 44 y.o. female with a history of HIV, hypertension               ? ?HIV under good control.  On Biktarvy.  100% adherent.  Viral load less than 20 and CD4 is 1263  30 pound weight loss since starting ozempic ( semaglutide  HTN better controlled- now on  triple therapy- amlodipine+  HCTZ= ACEI  DM started on ozempic    B12 deficiency on supplement ( she has been on long term PPI which can cause it)   Health maintenance updated-  Partner notification done   Discussed the management with patient - follow up 28months

## 2020-11-19 NOTE — Assessment & Plan Note (Signed)
Continues PPI BID through GI.

## 2020-11-19 NOTE — Assessment & Plan Note (Addendum)
Great control on ozempic! Congratulated. Continue this.

## 2020-11-19 NOTE — Assessment & Plan Note (Signed)
Stable period on colestipol.

## 2020-11-19 NOTE — Assessment & Plan Note (Signed)
Saw ID this morning - stable period under good control.

## 2020-11-20 ENCOUNTER — Other Ambulatory Visit: Payer: Self-pay | Admitting: Family Medicine

## 2020-11-20 DIAGNOSIS — Z1231 Encounter for screening mammogram for malignant neoplasm of breast: Secondary | ICD-10-CM

## 2020-11-20 LAB — T-HELPER CELLS CD4/CD8 %
% CD 4 Pos. Lymph.: 51.2 % (ref 30.8–58.5)
Absolute CD 4 Helper: 1178 /uL (ref 359–1519)
Basophils Absolute: 0 10*3/uL (ref 0.0–0.2)
Basos: 0 %
CD3+CD4+ Cells/CD3+CD8+ Cells Bld: 2.12 (ref 0.92–3.72)
CD3+CD8+ Cells # Bld: 554 /uL (ref 109–897)
CD3+CD8+ Cells NFr Bld: 24.1 % (ref 12.0–35.5)
EOS (ABSOLUTE): 0.1 10*3/uL (ref 0.0–0.4)
Eos: 2 %
Hematocrit: 38.3 % (ref 34.0–46.6)
Hemoglobin: 12.9 g/dL (ref 11.1–15.9)
Immature Grans (Abs): 0 10*3/uL (ref 0.0–0.1)
Immature Granulocytes: 0 %
Lymphocytes Absolute: 2.3 10*3/uL (ref 0.7–3.1)
Lymphs: 30 %
MCH: 31.9 pg (ref 26.6–33.0)
MCHC: 33.7 g/dL (ref 31.5–35.7)
MCV: 95 fL (ref 79–97)
Monocytes Absolute: 0.5 10*3/uL (ref 0.1–0.9)
Monocytes: 7 %
Neutrophils Absolute: 4.6 10*3/uL (ref 1.4–7.0)
Neutrophils: 61 %
Platelets: 330 10*3/uL (ref 150–450)
RBC: 4.05 x10E6/uL (ref 3.77–5.28)
RDW: 11.8 % (ref 11.7–15.4)
WBC: 7.5 10*3/uL (ref 3.4–10.8)

## 2020-11-20 LAB — HIV-1 RNA QUANT-NO REFLEX-BLD
HIV 1 RNA Quant: <20 copies/mL
LOG10 HIV-1 RNA: UNDETERMINED log10copy/mL

## 2020-11-20 LAB — RPR: RPR Ser Ql: NONREACTIVE

## 2020-11-23 LAB — QUANTIFERON-TB GOLD PLUS (RQFGPL)
QuantiFERON Mitogen Value: >10 IU/mL
QuantiFERON Nil Value: 0 IU/mL
QuantiFERON TB1 Ag Value: 0.02 IU/mL
QuantiFERON TB2 Ag Value: 0.01 IU/mL

## 2020-11-23 LAB — QUANTIFERON-TB GOLD PLUS: QuantiFERON-TB Gold Plus: NEGATIVE

## 2020-11-28 ENCOUNTER — Ambulatory Visit (INDEPENDENT_AMBULATORY_CARE_PROVIDER_SITE_OTHER): Payer: BC Managed Care – PPO

## 2020-11-28 ENCOUNTER — Other Ambulatory Visit: Payer: Self-pay

## 2020-11-28 DIAGNOSIS — E538 Deficiency of other specified B group vitamins: Secondary | ICD-10-CM | POA: Diagnosis not present

## 2020-11-28 MED ORDER — CYANOCOBALAMIN 1000 MCG/ML IJ SOLN
1000.0000 ug | Freq: Once | INTRAMUSCULAR | Status: AC
  Administered 2020-11-28: 1000 ug via INTRAMUSCULAR

## 2020-11-28 NOTE — Progress Notes (Signed)
Per orders of Dr. Tower, injection of vit B12 given by Tsering Leaman. Patient tolerated injection well.  

## 2020-12-03 ENCOUNTER — Other Ambulatory Visit: Payer: Self-pay

## 2020-12-03 ENCOUNTER — Ambulatory Visit
Admission: RE | Admit: 2020-12-03 | Discharge: 2020-12-03 | Disposition: A | Discharge status: Home / Self Care | Payer: BC Managed Care – PPO | Source: Ambulatory Visit | Attending: Family Medicine | Admitting: Family Medicine

## 2020-12-03 DIAGNOSIS — Z1231 Encounter for screening mammogram for malignant neoplasm of breast: Secondary | ICD-10-CM | POA: Insufficient documentation

## 2020-12-05 ENCOUNTER — Other Ambulatory Visit: Payer: Self-pay

## 2020-12-05 ENCOUNTER — Ambulatory Visit (INDEPENDENT_AMBULATORY_CARE_PROVIDER_SITE_OTHER): Payer: BC Managed Care – PPO | Admitting: *Deleted

## 2020-12-05 DIAGNOSIS — E538 Deficiency of other specified B group vitamins: Secondary | ICD-10-CM | POA: Diagnosis not present

## 2020-12-05 MED ORDER — CYANOCOBALAMIN 1000 MCG/ML IJ SOLN
1000.0000 ug | Freq: Once | INTRAMUSCULAR | Status: AC
  Administered 2020-12-05: 1000 ug via INTRAMUSCULAR

## 2020-12-05 NOTE — Progress Notes (Signed)
Per orders of Dr. Gutierrez, injection of Vitamin B12 given in Left Deltoid by Areeba Sulser Simpson. Patient tolerated injection well.  

## 2020-12-12 ENCOUNTER — Ambulatory Visit (INDEPENDENT_AMBULATORY_CARE_PROVIDER_SITE_OTHER): Payer: BC Managed Care – PPO | Admitting: *Deleted

## 2020-12-12 ENCOUNTER — Other Ambulatory Visit: Payer: Self-pay

## 2020-12-12 ENCOUNTER — Ambulatory Visit: Payer: BC Managed Care – PPO

## 2020-12-12 DIAGNOSIS — E538 Deficiency of other specified B group vitamins: Secondary | ICD-10-CM | POA: Diagnosis not present

## 2020-12-12 MED ORDER — CYANOCOBALAMIN 1000 MCG/ML IJ SOLN
1000.0000 ug | Freq: Once | INTRAMUSCULAR | Status: AC
Start: 2020-12-12 — End: 2020-12-12
  Administered 2020-12-12: 1000 ug via INTRAMUSCULAR

## 2020-12-12 NOTE — Progress Notes (Signed)
Per orders of Dr. Damita Dunnings, injection of B12 given by Earleen Reaper M in the Right Deltoid. PCP out of the office today  Patient tolerated injection well.

## 2020-12-19 ENCOUNTER — Other Ambulatory Visit: Payer: Self-pay

## 2020-12-19 ENCOUNTER — Ambulatory Visit (INDEPENDENT_AMBULATORY_CARE_PROVIDER_SITE_OTHER): Payer: BC Managed Care – PPO

## 2020-12-19 ENCOUNTER — Ambulatory Visit: Payer: BC Managed Care – PPO

## 2020-12-19 DIAGNOSIS — E538 Deficiency of other specified B group vitamins: Secondary | ICD-10-CM | POA: Diagnosis not present

## 2020-12-19 MED ORDER — CYANOCOBALAMIN 1000 MCG/ML IJ SOLN
1000.0000 ug | Freq: Once | INTRAMUSCULAR | Status: AC
  Administered 2020-12-19: 1000 ug via INTRAMUSCULAR

## 2020-12-19 NOTE — Progress Notes (Signed)
Per orders of Dr. Elsie Stain, injection of B-12 given by Francella Solian in right deltoid. Patient tolerated injection well. Patient will make appointment for next injection.

## 2020-12-24 ENCOUNTER — Other Ambulatory Visit: Payer: Self-pay

## 2020-12-24 DIAGNOSIS — B20 Human immunodeficiency virus [HIV] disease: Secondary | ICD-10-CM

## 2020-12-24 MED ORDER — BIKTARVY 50-200-25 MG PO TABS: 1.0000 | ORAL_TABLET | Freq: Every day | ORAL | 5 refills | Status: DC

## 2021-01-11 ENCOUNTER — Other Ambulatory Visit: Payer: Self-pay | Admitting: Family Medicine

## 2021-01-13 NOTE — Telephone Encounter (Signed)
Looks like med was stopped 08/22/20 at d/c by Dr. Ilda Basset.  Spoke with pt asking if she resumed med.  Denies that she has stating no one told her to.  Plz advise.   HCTZ Last filled:  01/06/20, #90 Last OV: 11/19/20, CPE Next OV:  05/21/21,  DM f/u

## 2021-01-14 ENCOUNTER — Encounter: Payer: Self-pay | Admitting: Family Medicine

## 2021-01-14 LAB — HM DIABETES EYE EXAM

## 2021-01-16 ENCOUNTER — Encounter: Payer: Self-pay | Admitting: Family Medicine

## 2021-01-20 ENCOUNTER — Encounter: Payer: Self-pay | Admitting: Family Medicine

## 2021-01-21 ENCOUNTER — Encounter: Payer: Self-pay | Admitting: Family Medicine

## 2021-01-21 ENCOUNTER — Ambulatory Visit (INDEPENDENT_AMBULATORY_CARE_PROVIDER_SITE_OTHER): Payer: BC Managed Care – PPO | Admitting: Family Medicine

## 2021-01-21 ENCOUNTER — Other Ambulatory Visit: Payer: Self-pay

## 2021-01-21 VITALS — BP 114/80 | HR 82 | Temp 98.0°F | Ht 67.75 in | Wt 279.1 lb

## 2021-01-21 DIAGNOSIS — S50861A Insect bite (nonvenomous) of right forearm, initial encounter: Secondary | ICD-10-CM | POA: Diagnosis not present

## 2021-01-21 DIAGNOSIS — E538 Deficiency of other specified B group vitamins: Secondary | ICD-10-CM | POA: Diagnosis not present

## 2021-01-21 DIAGNOSIS — W57XXXA Bitten or stung by nonvenomous insect and other nonvenomous arthropods, initial encounter: Secondary | ICD-10-CM | POA: Insufficient documentation

## 2021-01-21 MED ORDER — CYANOCOBALAMIN 1000 MCG/ML IJ SOLN
1000.0000 ug | Freq: Once | INTRAMUSCULAR | Status: AC
  Administered 2021-01-21: 1000 ug via INTRAMUSCULAR

## 2021-01-21 MED ORDER — TRIAMCINOLONE ACETONIDE 0.1 % EX CREA: 1.0000 "application " | TOPICAL_CREAM | Freq: Two times a day (BID) | CUTANEOUS | 0 refills | Status: DC

## 2021-01-21 NOTE — Patient Instructions (Signed)
Looks like resolving insect bite but a little strange to be bruising like that. Warm compresses as able throughout the day.  May use steroid cream sent to pharmacy (triamcinolone) as needed for itching.

## 2021-01-21 NOTE — Assessment & Plan Note (Signed)
Uncomplicated insect bite to R forearm. Supportive care measures reviewed. Rx TCI cream PRN itch. Discussed triple abx ointment and bandage as needed. She will let us know if not improving as expected.

## 2021-01-21 NOTE — Progress Notes (Signed)
Patient ID: Cassandra [name], female    DOB: 1975-08-22, 45 y.o.   MRN: [id number]  This visit was conducted in person.  BP 114/80   Pulse 82   Temp 98 F (36.7 C) (Temporal)   Ht 5' 7.75" (1.721 m)   Wt 279 lb 1 oz (126.6 kg)   LMP 07/10/2020   SpO2 97%   BMI 42.75 kg/m    CC: insect bite Subjective:   HPI: Cassandra [name] is a 45 y.o. female presenting on 01/21/2021 for Insect Bite (C/o spider bite on right forearm.  Noticed 01/17/21.  Area looked like a pimple, which pt popped.  Area is red, some bruising and itches.  Pt also c/o HA.  Applied Neosporin. )   Suffered insect bite to R forearm, first noted on Friday. Developed into pimple which she subsequently popped. Tender and itchy. No streaking redness. Tried OTC cortizone10 and neosporin without benefit. No fevers, nausea. Having a headache.       Relevant past medical, surgical, family and social history reviewed and updated as indicated. Interim medical history since our last visit reviewed. Allergies and medications reviewed and updated. Outpatient Medications Prior to Visit  Medication Sig Dispense Refill   Accu-Chek FastClix Lancets MISC 1 each by Does not apply route as directed. Use as directed to check blood sugar twice daily as needed. E11.8 102 each 3   acetaminophen (TYLENOL) 500 MG tablet Take 1,000 mg by mouth 2 (two) times daily as needed for moderate pain or headache.      amLODipine (NORVASC) 5 MG tablet TAKE 1 TABLET BY MOUTH EVERY DAY (Patient taking differently: Take 5 mg by mouth at bedtime.) 90 tablet 3   BIKTARVY 50-200-25 MG TABS tablet Take 1 tablet by mouth at bedtime. 30 tablet 5   bisacodyl (DULCOLAX) 5 MG EC tablet Take 1 tablet (5 mg total) by mouth daily as needed for moderate constipation. 14 tablet 0   citalopram (CELEXA) 20 MG tablet TAKE 1 TABLET BY MOUTH EVERY DAY 90 tablet 3   colestipol (COLESTID) 1 g tablet TAKE 2 TABLETS (2 G TOTAL) BY MOUTH 2 (TWO) TIMES DAILY. (Patient taking  differently: Take 1 g by mouth daily.) 360 tablet 0   cyclobenzaprine (FLEXERIL) 5 MG tablet TAKE 1 TABLET (5 MG TOTAL) BY MOUTH 2 (TWO) TIMES DAILY AS NEEDED (HEADACHE (SEDATION PRECAUTIONS)). 30 tablet 3   diphenhydrAMINE (BENADRYL) 25 MG tablet Take 25 mg by mouth at bedtime as needed for allergies or sleep.     glucose blood (ACCU-CHEK GUIDE) test strip 1 each by Other route as needed for other. Use as instructed to check blood sugar 100 each 0   lisinopril (ZESTRIL) 10 MG tablet TAKE 1 TABLET BY MOUTH EVERY DAY (Patient taking differently: Take 10 mg by mouth at bedtime.) 90 tablet 3   loratadine (CLARITIN) 10 MG tablet Take 10 mg by mouth daily as needed for allergies.     OZEMPIC, 1 MG/DOSE, 4 MG/3ML SOPN Inject 1 mg into the skin once a week.     pantoprazole (PROTONIX) 40 MG tablet TAKE 1 TABLET BY MOUTH TWICE A DAY 180 tablet 0   Peppermint Oil (IBGARD PO) Take 2 tablets by mouth daily as needed (diarrhea).     simethicone (MYLICON) 80 MG chewable tablet Chew 1 tablet (80 mg total) by mouth 4 (four) times daily as needed for flatulence. 30 tablet 0   No facility-administered medications prior to visit.     Per  HPI unless specifically indicated in ROS section below Review of Systems  Objective:  BP 114/80   Pulse 82   Temp 98 F (36.7 C) (Temporal)   Ht 5' 7.75" (1.721 m)   Wt 279 lb 1 oz (126.6 kg)   LMP 07/10/2020   SpO2 97%   BMI 42.75 kg/m   Wt Readings from Last 3 Encounters:  01/21/21 279 lb 1 oz (126.6 kg)  11/19/20 286 lb 2 oz (129.8 kg)  11/19/20 286 lb (129.7 kg)      Physical Exam Vitals and nursing note reviewed.  Skin:    General: Skin is warm and dry.     Findings: No rash.     Comments: Small scabbed pustule to right medial forearm without drainage or streaking erythema      Results for orders placed or performed in visit on 01/16/21  HM DIABETES EYE EXAM  Result Value Ref Range   HM Diabetic Eye Exam No Retinopathy No Retinopathy    Assessment  & Plan:  This visit occurred during the SARS-CoV-2 public health emergency.  Safety protocols were in place, including screening questions prior to the visit, additional usage of staff PPE, and extensive cleaning of exam room while observing appropriate contact time as indicated for disinfecting solutions.   Problem List Items Addressed This Visit     Vitamin B12 deficiency    b12 shot today.       Insect bite - Primary    Uncomplicated insect bite to R forearm. Supportive care measures reviewed. Rx TCI cream PRN itch. Discussed triple abx ointment and bandage as needed. She will let us know if not improving as expected.         Meds ordered this encounter  Medications   triamcinolone cream (KENALOG) 0.1 %    Sig: Apply 1 application topically 2 (two) times daily. Apply to AA.    Dispense:  30 g    Refill:  0   cyanocobalamin ((VITAMIN B-12)) injection 1,000 mcg    No orders of the defined types were placed in this encounter.    Patient Instructions  Looks like resolving insect bite but a little strange to be bruising like that. Warm compresses as able throughout the day.  May use steroid cream sent to pharmacy (triamcinolone) as needed for itching.   Follow up plan: No follow-ups on file.  Ria Bush, MD

## 2021-01-21 NOTE — Telephone Encounter (Signed)
Seen today. 

## 2021-01-21 NOTE — Assessment & Plan Note (Signed)
b12 shot today. 

## 2021-01-23 ENCOUNTER — Ambulatory Visit: Payer: BC Managed Care – PPO

## 2021-01-23 ENCOUNTER — Other Ambulatory Visit: Payer: Self-pay | Admitting: Family Medicine

## 2021-02-27 ENCOUNTER — Ambulatory Visit: Payer: BC Managed Care – PPO

## 2021-03-02 ENCOUNTER — Other Ambulatory Visit: Payer: Self-pay

## 2021-03-02 ENCOUNTER — Emergency Department: Payer: 59

## 2021-03-02 DIAGNOSIS — Z21 Asymptomatic human immunodeficiency virus [HIV] infection status: Secondary | ICD-10-CM | POA: Insufficient documentation

## 2021-03-02 DIAGNOSIS — X501XXA Overexertion from prolonged static or awkward postures, initial encounter: Secondary | ICD-10-CM | POA: Diagnosis not present

## 2021-03-02 DIAGNOSIS — M79672 Pain in left foot: Secondary | ICD-10-CM | POA: Diagnosis not present

## 2021-03-02 DIAGNOSIS — F17210 Nicotine dependence, cigarettes, uncomplicated: Secondary | ICD-10-CM | POA: Insufficient documentation

## 2021-03-02 DIAGNOSIS — I10 Essential (primary) hypertension: Secondary | ICD-10-CM | POA: Insufficient documentation

## 2021-03-02 DIAGNOSIS — Z8616 Personal history of COVID-19: Secondary | ICD-10-CM | POA: Diagnosis not present

## 2021-03-02 DIAGNOSIS — S92351A Displaced fracture of fifth metatarsal bone, right foot, initial encounter for closed fracture: Secondary | ICD-10-CM | POA: Diagnosis not present

## 2021-03-02 DIAGNOSIS — S92352A Displaced fracture of fifth metatarsal bone, left foot, initial encounter for closed fracture: Secondary | ICD-10-CM | POA: Diagnosis not present

## 2021-03-02 DIAGNOSIS — Z79899 Other long term (current) drug therapy: Secondary | ICD-10-CM | POA: Diagnosis not present

## 2021-03-02 DIAGNOSIS — T1490XA Injury, unspecified, initial encounter: Secondary | ICD-10-CM

## 2021-03-02 DIAGNOSIS — Y9301 Activity, walking, marching and hiking: Secondary | ICD-10-CM | POA: Insufficient documentation

## 2021-03-02 DIAGNOSIS — S92354A Nondisplaced fracture of fifth metatarsal bone, right foot, initial encounter for closed fracture: Secondary | ICD-10-CM | POA: Diagnosis not present

## 2021-03-02 DIAGNOSIS — E119 Type 2 diabetes mellitus without complications: Secondary | ICD-10-CM | POA: Insufficient documentation

## 2021-03-02 DIAGNOSIS — S99921A Unspecified injury of right foot, initial encounter: Secondary | ICD-10-CM | POA: Diagnosis present

## 2021-03-02 NOTE — ED Triage Notes (Signed)
Pt states she was walking down steps when "I missed as step". Pt complains of bilateral foot pain, no ankle pain. No obvious deformity noted.

## 2021-03-03 ENCOUNTER — Emergency Department
Admission: EM | Admit: 2021-03-03 | Discharge: 2021-03-03 | Disposition: A | Discharge status: Home / Self Care | Payer: 59 | Attending: Emergency Medicine | Admitting: Emergency Medicine

## 2021-03-03 ENCOUNTER — Encounter: Payer: Self-pay | Admitting: Family Medicine

## 2021-03-03 DIAGNOSIS — S92351A Displaced fracture of fifth metatarsal bone, right foot, initial encounter for closed fracture: Secondary | ICD-10-CM

## 2021-03-03 DIAGNOSIS — S92312A Displaced fracture of first metatarsal bone, left foot, initial encounter for closed fracture: Secondary | ICD-10-CM

## 2021-03-03 DIAGNOSIS — T1490XA Injury, unspecified, initial encounter: Secondary | ICD-10-CM

## 2021-03-03 MED ORDER — ACETAMINOPHEN 500 MG PO TABS: 1000.0000 mg | ORAL_TABLET | Freq: Three times a day (TID) | ORAL | 0 refills | Status: AC | PRN

## 2021-03-03 MED ORDER — OXYCODONE HCL 5 MG PO TABS: 5.0000 mg | ORAL_TABLET | ORAL | 0 refills | Status: DC | PRN

## 2021-03-03 MED ORDER — OXYCODONE-ACETAMINOPHEN 5-325 MG PO TABS
1.0000 | ORAL_TABLET | Freq: Once | ORAL | Status: AC
  Administered 2021-03-03: 1 via ORAL
  Filled 2021-03-03: qty 1

## 2021-03-03 MED ORDER — IBUPROFEN 600 MG PO TABS
600.0000 mg | ORAL_TABLET | Freq: Once | ORAL | Status: AC
  Administered 2021-03-03: 600 mg via ORAL
  Filled 2021-03-03: qty 1

## 2021-03-03 NOTE — ED Provider Notes (Signed)
[facility] Emergency Department Provider Note  ____________________________________________  Time seen: Approximately 2:48 AM  I have reviewed the triage vital signs and the nursing notes.   HISTORY  Chief Complaint Foot Injury   HPI TAL NEER is a 45 y.o. female presents for evaluation of bilateral foot pain.  Patient reports that she was walking down steps when she missed a step.  As she was trying to prevent a fall she twisted both of her feet.  She is now complaining of pain in the dorsum of both right and left feet.  She denies having a fall, head trauma or LOC.  She denies hip pain or back pain.  She reports that she is able to bear weight on the right foot but not on the left.  She reports that the pain is mild with elevation but severe with weightbearing.   Past Medical History:  Diagnosis Date   Allergy    Allergy to animal dander    cats and dogs   Anxiety    Arthritis    Back   Complication of anesthesia    Pt does not want an epidural, have had reactions to that medication   Constipation    alternating from constipation to diarrhea   COVID-19 07/12/2020   Depression    Diabetes mellitus without complication ([place])    Type II   Gallstones 06/2016   by xray - surgery to remove   Generalized headaches    frequent   GERD (gastroesophageal reflux disease)    History of abnormal Pap smear    remote   History of gestational diabetes    first 2 pregnancies   History of kidney stones    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    Hypertension    IBS (irritable bowel syndrome)    Morbid obesity ([place])    Periodontal disease 08/2011   currently getting dental work   Tobacco use     Patient Active Problem List   Diagnosis Date Noted   Insect bite 01/21/2021   Dysplasia of cervix, low grade (CIN 1) 09/26/2020   COVID-19 07/13/2020   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   Migraine  06/30/2019   Vitamin B12 deficiency 12/31/2018   Paresthesia of right foot 12/27/2018   GERD (gastroesophageal reflux disease) 12/27/2018   Polyp of descending colon    Chronic diarrhea 04/04/2018   Closed compression fracture of L1 lumbar vertebra 07/05/2016   Lumbar pain with radiation down both legs 12/20/2015   Health maintenance examination 05/25/2014   Ulnar neuropathy 06/28/2013   Hypertension 09/14/2012   HIV (human immunodeficiency virus infection) ([place]) 01/04/2012   Varicose vein 01/01/2012   MDD (major depressive disorder), recurrent episode, moderate ([place]) 09/02/2011   Body mass index (BMI) of 40.0 to 44.9 in adult [facility])    Ex-smoker    Controlled diabetes mellitus type 2 with complications [place])     Past Surgical History:  Procedure Laterality Date   BIOPSY  07/04/2018   Procedure: BIOPSY;  Surgeon: Thornton Park, MD;  Location: [facility];  Service: Gastroenterology;;   CESAREAN SECTION  [phone]   x2   CHOLECYSTECTOMY  2018   COLONOSCOPY WITH PROPOFOL N/A 07/04/2018   for chronic diarrhea after cholecystectomy started on colestipol with benefit ([place]). Thought she has IBS-D. 2 TA, rpt 5 yrs   CYSTOSCOPY N/A 08/20/2020   Procedure: CYSTOSCOPY;  Surgeon: Aletha Halim, MD;  Location: [facility] OR;  Service: Gynecology;  Laterality: N/A;   ENDOMETRIAL BIOPSY  01/25/2020       HYSTERECTOMY ABDOMINAL WITH SALPINGECTOMY Bilateral 08/20/2020   done for heavy bleed and pelvic pain - pathology showed CIN1 of cervix Ilda Basset, Eduard Clos, MD)   HYSTEROSCOPY WITH D & C N/A 01/05/2018   [place] for heavy bleeding, IUD removed - DILATATION AND CURETTAGE /HYSTEROSCOPY WITH MINERVA ABLATION; [name], MD   INTRAUTERINE DEVICE INSERTION     Mirena   POLYPECTOMY  07/04/2018   Procedure: POLYPECTOMY;  Surgeon: Thornton Park, MD;  Location: [facility];  Service: Gastroenterology;;   TOTAL LAPAROSCOPIC HYSTERECTOMY WITH SALPINGECTOMY Bilateral 08/20/2020   Procedure:  ATTEMPTED TOTAL LAPAROSCOPIC HYSTERECTOMY WITH SALPINGECTOMY;  Surgeon: Aletha Halim, MD;  Location: [facility];  Service: Gynecology;  Laterality: Bilateral;   TUBAL LIGATION     WISDOM TOOTH EXTRACTION     x 4    Prior to Admission medications   Medication Sig Start Date End Date Taking? Authorizing Provider  acetaminophen (TYLENOL) 500 MG tablet Take 2 tablets (1,000 mg total) by mouth every 8 (eight) hours as needed for mild pain, moderate pain, fever or headache. 03/03/21 03/03/22 Yes Alfred Levins, [place], MD  amLODipine (NORVASC) 5 MG tablet TAKE 1 TABLET BY MOUTH EVERY DAY 01/23/21   Ria Bush, MD  oxyCODONE (ROXICODONE) 5 MG immediate release tablet Take 1 tablet (5 mg total) by mouth every 4 (four) hours as needed for moderate pain or severe pain. 03/03/21 03/03/22 Yes Alfred Levins, [place], MD  Accu-Chek FastClix Lancets MISC 1 each by Does not apply route as directed. Use as directed to check blood sugar twice daily as needed. E11.8 10/03/19   Ria Bush, MD  BIKTARVY 50-200-25 MG TABS tablet Take 1 tablet by mouth at bedtime. 12/24/20   Tsosie Billing, MD  bisacodyl (DULCOLAX) 5 MG EC tablet Take 1 tablet (5 mg total) by mouth daily as needed for moderate constipation. 08/22/20   Aletha Halim, MD  citalopram (CELEXA) 20 MG tablet TAKE 1 TABLET BY MOUTH EVERY DAY 10/21/20   Ria Bush, MD  colestipol (COLESTID) 1 g tablet TAKE 2 TABLETS (2 G TOTAL) BY MOUTH 2 (TWO) TIMES DAILY. Patient taking differently: Take 1 g by mouth daily. 05/23/19   Thornton Park, MD  cyclobenzaprine (FLEXERIL) 5 MG tablet TAKE 1 TABLET (5 MG TOTAL) BY MOUTH 2 (TWO) TIMES DAILY AS NEEDED (HEADACHE (SEDATION PRECAUTIONS)). 07/05/20   Ria Bush, MD  diphenhydrAMINE (BENADRYL) 25 MG tablet Take 25 mg by mouth at bedtime as needed for allergies or sleep.    [provider]  glucose blood (ACCU-CHEK GUIDE) test strip 1 each by Other route as needed for other. Use as instructed  to check blood sugar 01/17/19   Ria Bush, MD  lisinopril (ZESTRIL) 10 MG tablet TAKE 1 TABLET BY MOUTH EVERY DAY Patient taking differently: Take 10 mg by mouth at bedtime. 04/19/20   Ria Bush, MD  loratadine (CLARITIN) 10 MG tablet Take 10 mg by mouth daily as needed for allergies.    [provider]  OZEMPIC, 1 MG/DOSE, 4 MG/3ML SOPN Inject 1 mg into the skin once a week. 10/21/20   Ria Bush, MD  pantoprazole (PROTONIX) 40 MG tablet TAKE 1 TABLET BY MOUTH TWICE A DAY 07/04/20   Thornton Park, MD  Peppermint Oil (IBGARD PO) Take 2 tablets by mouth daily as needed (diarrhea).    [provider]  simethicone (MYLICON) 80 MG chewable tablet Chew 1 tablet (80 mg  total) by mouth 4 (four) times daily as needed for flatulence. 08/22/20   Aletha Halim, MD  triamcinolone cream (KENALOG) 0.1 % Apply 1 application topically 2 (two) times daily. Apply to [place]. 01/21/21 01/21/22  Ria Bush, MD    Allergies Metformin and related  Family History  Problem Relation Age of Onset   Hypertension Mother    Hypertension Father    Appendicitis Father    Atrial fibrillation Father    Diabetes Maternal Grandmother        s/p amputation   Hyperlipidemia Maternal Grandmother    Hypertension Maternal Grandmother    Stroke Maternal Grandmother    Hyperlipidemia Maternal Grandfather    Hypertension Maternal Grandfather    Skin cancer Maternal Grandfather    Diabetes Maternal Grandfather    Seizures Paternal Grandmother 79       deceased   Coronary artery disease Paternal Grandfather 70       MI   Breast cancer Neg Hx    Colon cancer Neg Hx    Colon polyps Neg Hx    Esophageal cancer Neg Hx    Rectal cancer Neg Hx    Stomach cancer Neg Hx     Social History Social History   Tobacco Use   Smoking status: Some Days    Packs/day: 0.10    Years: 23.00    Pack years: 2.30    Types: Cigarettes   Smokeless tobacco: Never   Tobacco comments:    1  every now and then  Vaping Use   Vaping Use: Never used  Substance Use Topics   Alcohol use: Yes    Alcohol/week: 0.0 standard drinks    Comment: occasional   Drug use: No    Review of Systems  Constitutional: Negative for fever. Eyes: Negative for visual changes. ENT: Negative for sore throat. Neck: No neck pain  Cardiovascular: Negative for chest pain. Respiratory: Negative for shortness of breath. Gastrointestinal: Negative for abdominal pain, vomiting or diarrhea. Genitourinary: Negative for dysuria. Musculoskeletal: Negative for back pain. + b/l feet pain Skin: Negative for rash. Neurological: Negative for headaches, weakness or numbness. Psych: No SI or HI  ____________________________________________   PHYSICAL EXAM:  VITAL SIGNS: ED Triage Vitals [03/02/21 2225]  Enc Vitals Group     BP (!) 132/93     Pulse Rate 100     Resp 16     Temp      Temp Source Oral     SpO2 100 %     Weight 279 lb (126.6 kg)     Height 5\' 7"  (1.702 m)     Head Circumference      Peak Flow      Pain Score 9     Pain Loc      Pain Edu?      Excl. in [place]?     Constitutional: Alert and oriented. Well appearing and in no apparent distress. HEENT:      Head: Normocephalic and atraumatic.         Eyes: Conjunctivae are normal. Sclera is non-icteric.       Mouth/Throat: Mucous membranes are moist.       Neck: No midline C-spine tenderness Cardiovascular: Regular rate and rhythm. No murmurs, gallops, or rubs. 2+ symmetrical distal pulses are present in all extremities. No JVD. Respiratory: Normal respiratory effort. Lungs are clear to auscultation bilaterally.  Gastrointestinal: Soft, non tender. Musculoskeletal:  Full painless range of motion of bilateral hips and knees and ankles.  Patient is tender to palpation on the right fifth metatarsal and left first metatarsal.  Skin is intact.  No midline spine tenderness Neurologic: Normal speech and language. Face is symmetric. Moving all  extremities. No gross focal neurologic deficits are appreciated. Skin: Skin is warm, dry and intact. No rash noted. Psychiatric: Mood and affect are normal. Speech and behavior are normal.  ____________________________________________   LABS (all labs ordered are listed, but only abnormal results are displayed)  Labs Reviewed - No data to display ____________________________________________  EKG  none  ____________________________________________  RADIOLOGY  I have personally reviewed the images performed during this visit and I agree with the Radiologist's read.   Interpretation by Radiologist:  DG Foot Complete Left  Result Date: 03/02/2021 CLINICAL DATA:  Missed a step bilateral foot pain EXAM: LEFT FOOT - COMPLETE 3+ VIEW COMPARISON:  09/14/2014 FINDINGS: Acute comminuted and distraction fracture involving lateral sesamoid bone at the head of first metatarsal. Medial sesamoid bone appears intact. Small fracture fragment adjacent to the head of the first metatarsal, possibly due to cortical fractures. No subluxation. Small plantar calcaneal spur IMPRESSION: Acute comminuted and distracted fracture involving lateral sesamoid bone at the head of first metatarsal. Suspect additional small cortical fractures off the head of the first metatarsal Electronically Signed   By: Donavan Foil M.D.   On: 03/02/2021 22:50   DG Foot Complete Right  Result Date: 03/02/2021 CLINICAL DATA:  Missed a step with foot pain EXAM: RIGHT FOOT COMPLETE - 3+ VIEW COMPARISON:  02/26/2019 FINDINGS: Acute nondisplaced fracture involving the neck of the fifth metatarsal. No significant angulation. No subluxation IMPRESSION: Acute nondisplaced fracture involving the neck of the fifth metatarsal Electronically Signed   By: Donavan Foil M.D.   On: 03/02/2021 22:48     ____________________________________________   PROCEDURES  Procedure(s) performed: yes Procedures  SPLINT APPLICATION Date/Time: 6:21  AM Authorized by: Rudene Re Consent: Verbal consent obtained. Risks and benefits: risks, benefits and alternatives were discussed Consent given by: patient Splint applied by: ED technician Location details: b/l feet  Splint type: b/l short leg Supplies used: orthoglass Post-procedure: The splinted body part was neurovascularly unchanged following the procedure. Patient tolerance: Patient tolerated the procedure well with no immediate complications.    Critical Care performed:  None ____________________________________________   INITIAL IMPRESSION / ASSESSMENT AND PLAN / ED COURSE  45 y.o. female presents for evaluation of bilateral foot pain.  Patient has an acute nondisplaced fracture of the neck of the fifth metatarsal on the right and a acute comminuted fracture of the lateral sesamoid bone at the head of the first metatarsal on the left.  Patient received a splint in each foot per procedure note above.  Recommended nonweightbearing.  Prescription for wheelchair was provided.  Patient was given a walker to help get in and out of the house today.  Discussed follow-up with orthopedics.  Will provide with a short course of Percocet for pain.  Discussed my standard return precautions      _____________________________________________ Please note:  Patient was evaluated in Emergency Department today for the symptoms described in the history of present illness. Patient was evaluated in the context of the global COVID-19 pandemic, which necessitated consideration that the patient might be at risk for infection with the SARS-CoV-2 virus that causes COVID-19. Institutional protocols and algorithms that pertain to the evaluation of patients at risk for COVID-19 are in a state of rapid change based on information released by regulatory bodies including the CDC and federal  and state organizations. These policies and algorithms were followed during the patient's care in the ED.  Some ED  evaluations and interventions may be delayed as a result of limited staffing during the pandemic.   [place] Controlled Substance Database was reviewed by me. ____________________________________________   FINAL CLINICAL IMPRESSION(S) / ED DIAGNOSES   Final diagnoses:  Injury  Closed displaced fracture of fifth metatarsal bone of right foot, initial encounter  Closed displaced fracture of first metatarsal bone of left foot, initial encounter      NEW MEDICATIONS STARTED DURING THIS VISIT:  ED Discharge Orders          Ordered    acetaminophen (TYLENOL) 500 MG tablet  Every 8 hours PRN        03/03/21 0301    oxyCODONE (ROXICODONE) 5 MG immediate release tablet  Every 4 hours PRN        03/03/21 0301             Note:  This document was prepared using Dragon voice recognition software and may include unintentional dictation errors.    Alfred Levins, [place], MD 03/03/21 [id number]

## 2021-03-03 NOTE — ED Notes (Signed)
Pt states she lost her footing on the stairs when letting her dogs out this evening. Pt c/o b/l foot pain. Unable to bear weight on L foot at this time.

## 2021-03-03 NOTE — Discharge Instructions (Addendum)
Do not put weight on either foot until you are seen by an orthopedic surgeon.  Keep the splint in place.  Make sure to keep it dry and clean.  The splint cannot get wet.  For pain take 1000 mg of tylenol 3 times a day and 1 oxycodone as needed every 4-6 hours for breakthrough pain.

## 2021-03-04 ENCOUNTER — Ambulatory Visit: Payer: BC Managed Care – PPO

## 2021-03-05 ENCOUNTER — Other Ambulatory Visit: Payer: Self-pay

## 2021-03-05 ENCOUNTER — Ambulatory Visit (INDEPENDENT_AMBULATORY_CARE_PROVIDER_SITE_OTHER): Payer: BC Managed Care – PPO

## 2021-03-05 DIAGNOSIS — E538 Deficiency of other specified B group vitamins: Secondary | ICD-10-CM

## 2021-03-05 MED ORDER — CYANOCOBALAMIN 1000 MCG/ML IJ SOLN
1000.0000 ug | Freq: Once | INTRAMUSCULAR | Status: AC
  Administered 2021-03-05: 1000 ug via INTRAMUSCULAR

## 2021-03-05 NOTE — Progress Notes (Signed)
Per orders of Dr. Lorelei Pont, in Dr. Danise Mina' absence, monthly injection of B12, given by Loreen Freud. Patient tolerated injection well.

## 2021-03-25 ENCOUNTER — Other Ambulatory Visit: Payer: Self-pay | Admitting: Gastroenterology

## 2021-03-25 ENCOUNTER — Other Ambulatory Visit: Payer: Self-pay | Admitting: Family Medicine

## 2021-04-01 ENCOUNTER — Ambulatory Visit: Payer: BC Managed Care – PPO

## 2021-04-03 ENCOUNTER — Ambulatory Visit: Payer: BC Managed Care – PPO

## 2021-04-09 ENCOUNTER — Telehealth: Payer: Self-pay | Admitting: Family Medicine

## 2021-04-09 NOTE — Telephone Encounter (Signed)
[name] with [name] called stating that pt needs authorization for medication  OZEMPIC, 1 MG/DOSE, 4 MG/3ML SOPN.

## 2021-04-09 NOTE — Telephone Encounter (Signed)
Pt called stating that she called the pharmacy about medication ozempic and they said the insurance is not going through and she need to contact the provider so he can give authorization

## 2021-04-10 NOTE — Telephone Encounter (Signed)
Submitted PA; key:  [id number].  Decision pending.   Made pt aware by phn.  Cassandra [name] understanding and expresses her thanks.

## 2021-04-10 NOTE — Telephone Encounter (Signed)
See other phn note, 04/09/21.

## 2021-04-24 NOTE — Telephone Encounter (Addendum)
Received PA approval via CoverMyMeds, valid through 04/10/2022.  Made pharmacy aware.   Printed copy of approval to scan.

## 2021-04-24 NOTE — Telephone Encounter (Addendum)
Noted.  See other 04/09/21 phn note.

## 2021-04-24 NOTE — Telephone Encounter (Signed)
Camile from [employer] called in wants to make PCP aware that pt has been approved for [facility]

## 2021-05-07 ENCOUNTER — Ambulatory Visit: Payer: BC Managed Care – PPO

## 2021-05-08 ENCOUNTER — Ambulatory Visit: Payer: BC Managed Care – PPO

## 2021-05-21 ENCOUNTER — Ambulatory Visit: Payer: BC Managed Care – PPO | Admitting: Family Medicine

## 2021-05-21 ENCOUNTER — Other Ambulatory Visit: Payer: Self-pay

## 2021-05-21 ENCOUNTER — Ambulatory Visit (INDEPENDENT_AMBULATORY_CARE_PROVIDER_SITE_OTHER): Payer: BC Managed Care – PPO | Admitting: Family Medicine

## 2021-05-21 ENCOUNTER — Encounter: Payer: Self-pay | Admitting: Family Medicine

## 2021-05-21 ENCOUNTER — Ambulatory Visit: Payer: BC Managed Care – PPO

## 2021-05-21 VITALS — BP 122/82 | HR 82 | Temp 97.7°F | Ht 67.0 in | Wt 274.6 lb

## 2021-05-21 DIAGNOSIS — E538 Deficiency of other specified B group vitamins: Secondary | ICD-10-CM

## 2021-05-21 DIAGNOSIS — Z23 Encounter for immunization: Secondary | ICD-10-CM

## 2021-05-21 DIAGNOSIS — E118 Type 2 diabetes mellitus with unspecified complications: Secondary | ICD-10-CM

## 2021-05-21 DIAGNOSIS — Z6841 Body Mass Index (BMI) 40.0 and over, adult: Secondary | ICD-10-CM

## 2021-05-21 LAB — POCT GLYCOSYLATED HEMOGLOBIN (HGB A1C): Hemoglobin A1C: 5 % (ref 4.0–5.6)

## 2021-05-21 MED ORDER — CYANOCOBALAMIN 1000 MCG/ML IJ SOLN
1000.0000 ug | Freq: Once | INTRAMUSCULAR | Status: AC
  Administered 2021-05-21: 10:00:00 1000 ug via INTRAMUSCULAR

## 2021-05-21 NOTE — Assessment & Plan Note (Addendum)
Chronic, great control on ozempic - continue this medication which is effective and well tolerated.

## 2021-05-21 NOTE — Patient Instructions (Signed)
You are doing well today. Sugars are doing great! Continue ozempic and other medicines. Schedule lab visit in 1 month (non-fasting) for B12 check.  Return in 6 months for physical.

## 2021-05-21 NOTE — Assessment & Plan Note (Addendum)
H/o R foot paresthesias, none since starting supplementation.  Continue monthly B12 shots, one provided today.  RTC 1 month to recheck B12 levels, then determine need for ongoing treatment.

## 2021-05-21 NOTE — Progress Notes (Signed)
Patient ID: Cassandra [name], female    DOB: Jun 18, 1975, 45 y.o.   MRN: [id number]  This visit was conducted in person.  BP 122/82    Pulse 82    Temp 97.7 F (36.5 C) (Temporal)    Ht 5\' 7"  (1.702 m)    Wt 274 lb 9 oz (124.5 kg) Comment: Wearing L boot   LMP 07/10/2020    SpO2 97%    BMI 43.00 kg/m    CC: DM f/u visit  Subjective:   HPI: Cassandra [name] is a 45 y.o. female presenting on 05/21/2021 for Diabetes (Here for f/u.)   Recovering from recent L foot surgery 04/2021 for fractured sesamoid bones. Also had R base of 5th MT fracture - this healed well without surgery.   Continues monthly B12 shots started 11/2020, one provided today.   DM - does regularly check sugars fasting in the mornings (80-90 in am, pm 120-130). Compliant with antihyperglycemic regimen which includes: ozempic 1mg  daily. Tolerating well without nausea, constipation or epigastric pain. Denies low sugars or hypoglycemic symptoms. Denies paresthesias, blurry vision. Last diabetic eye exam 01/2021. Glucometer brand: accuchek. Last foot exam: 07/2020. DSME: 09/2019.  Lab Results  Component Value Date   HGBA1C 5.0 05/21/2021   Diabetic Foot Exam - Simple   Simple Foot Form Diabetic Foot exam was performed with the following findings: Yes 05/21/2021 10:55 AM  Visual Inspection No deformities, no ulcerations, no other skin breakdown bilaterally: Yes Sensation Testing Intact to touch and monofilament testing bilaterally: Yes Pulse Check Posterior Tibialis and Dorsalis pulse intact bilaterally: Yes Comments L foot in post op shoe, removed and evaluated Healing scabs to R medial foot after surgery    Lab Results  Component Value Date   MICROALBUR 1.3 12/27/2019         Relevant past medical, surgical, family and social history reviewed and updated as indicated. Interim medical history since our last visit reviewed. Allergies and medications reviewed and updated. Outpatient Medications Prior to Visit   Medication Sig Dispense Refill   Accu-Chek FastClix Lancets MISC 1 each by Does not apply route as directed. Use as directed to check blood sugar twice daily as needed. E11.8 102 each 3   acetaminophen (TYLENOL) 500 MG tablet Take 2 tablets (1,000 mg total) by mouth every 8 (eight) hours as needed for mild pain, moderate pain, fever or headache. 50 tablet 0   amLODipine (NORVASC) 5 MG tablet TAKE 1 TABLET BY MOUTH EVERY DAY 90 tablet 3   BIKTARVY 50-200-25 MG TABS tablet Take 1 tablet by mouth at bedtime. 30 tablet 5   bisacodyl (DULCOLAX) 5 MG EC tablet Take 1 tablet (5 mg total) by mouth daily as needed for moderate constipation. 14 tablet 0   citalopram (CELEXA) 20 MG tablet TAKE 1 TABLET BY MOUTH EVERY DAY 90 tablet 3   colestipol (COLESTID) 1 g tablet TAKE 2 TABLETS (2 G TOTAL) BY MOUTH 2 (TWO) TIMES DAILY. (Patient taking differently: Take 1 g by mouth daily.) 360 tablet 0   cyclobenzaprine (FLEXERIL) 5 MG tablet TAKE 1 TABLET (5 MG TOTAL) BY MOUTH 2 (TWO) TIMES DAILY AS NEEDED (HEADACHE (SEDATION PRECAUTIONS)). 30 tablet 3   diphenhydrAMINE (BENADRYL) 25 MG tablet Take 25 mg by mouth at bedtime as needed for allergies or sleep.     glucose blood (ACCU-CHEK GUIDE) test strip 1 each by Other route as needed for other. Use as instructed to check blood sugar 100 each 0   lisinopril (  ZESTRIL) 10 MG tablet TAKE 1 TABLET BY MOUTH EVERY DAY 90 tablet 2   loratadine (CLARITIN) 10 MG tablet Take 10 mg by mouth daily as needed for allergies.     meloxicam (MOBIC) 15 MG tablet Take 1 tablet (15 mg total) by mouth daily.     OZEMPIC, 1 MG/DOSE, 4 MG/3ML SOPN INJECT 0.75 MLS (1 MG TOTAL) INTO THE SKIN ONCE A WEEK. 9 mL 3   pantoprazole (PROTONIX) 40 MG tablet TAKE 1 TABLET BY MOUTH TWICE A DAY 180 tablet 0   Peppermint Oil (IBGARD PO) Take 2 tablets by mouth daily as needed (diarrhea).     simethicone (MYLICON) 80 MG chewable tablet Chew 1 tablet (80 mg total) by mouth 4 (four) times daily as needed for  flatulence. 30 tablet 0   oxyCODONE (ROXICODONE) 5 MG immediate release tablet Take 1 tablet (5 mg total) by mouth every 4 (four) hours as needed for moderate pain or severe pain. 12 tablet 0   triamcinolone cream (KENALOG) 0.1 % Apply 1 application topically 2 (two) times daily. Apply to AA. 30 g 0   No facility-administered medications prior to visit.     Per HPI unless specifically indicated in ROS section below Review of Systems  Objective:  BP 122/82    Pulse 82    Temp 97.7 F (36.5 C) (Temporal)    Ht 5\' 7"  (1.702 m)    Wt 274 lb 9 oz (124.5 kg) Comment: Wearing L boot   LMP 07/10/2020    SpO2 97%    BMI 43.00 kg/m   Wt Readings from Last 3 Encounters:  05/21/21 274 lb 9 oz (124.5 kg)  03/02/21 279 lb (126.6 kg)  01/21/21 279 lb 1 oz (126.6 kg)      Physical Exam Vitals and nursing note reviewed.  Constitutional:      Appearance: Normal appearance. She is not ill-appearing.  Eyes:     Extraocular Movements: Extraocular movements intact.     Conjunctiva/sclera: Conjunctivae normal.     Pupils: Pupils are equal, round, and reactive to light.  Cardiovascular:     Rate and Rhythm: Normal rate and regular rhythm.     Pulses: Normal pulses.     Heart sounds: Normal heart sounds. No murmur heard. Pulmonary:     Effort: Pulmonary effort is normal. No respiratory distress.     Breath sounds: Normal breath sounds. No wheezing, rhonchi or rales.  Musculoskeletal:     Right lower leg: No edema.     Left lower leg: No edema.  Skin:    General: Skin is warm and dry.     Findings: No rash.  Neurological:     Mental Status: She is alert.  Psychiatric:        Mood and Affect: Mood normal.        Behavior: Behavior normal.      Results for orders placed or performed in visit on 05/21/21  POCT glycosylated hemoglobin (Hb A1C)  Result Value Ref Range   Hemoglobin A1C 5.0 4.0 - 5.6 %   HbA1c POC (<> result, manual entry)     HbA1c, POC (prediabetic range)     HbA1c, POC  (controlled diabetic range)      Assessment & Plan:  This visit occurred during the SARS-CoV-2 public health emergency.  Safety protocols were in place, including screening questions prior to the visit, additional usage of staff PPE, and extensive cleaning of exam room while observing appropriate contact time as indicated  for disinfecting solutions.   Problem List Items Addressed This Visit     Controlled diabetes mellitus type 2 with complications ([place]) - Primary    Chronic, great control on ozempic - continue this medication which is effective and well tolerated.       Relevant Orders   POCT glycosylated hemoglobin (Hb A1C) (Completed)   Body mass index (BMI) of 40.0 to 44.9 in adult [facility])    Chronic, stable period on ozempic - continue. Has lost a total of 75 lbs!      Vitamin B12 deficiency    H/o R foot paresthesias, none since starting supplementation.  Continue monthly B12 shots, one provided today.  RTC 1 month to recheck B12 levels, then determine need for ongoing treatment.       Relevant Orders   Vitamin B12   Intrinsic Factor Antibodies   Other Visit Diagnoses     Need for influenza vaccination       Relevant Orders   Flu Vaccine QUAD 82mo+IM (Fluarix, Fluzone & Alfiuria Quad PF) (Completed)        Meds ordered this encounter  Medications   cyanocobalamin ((VITAMIN B-12)) injection 1,000 mcg   Orders Placed This Encounter  Procedures   Flu Vaccine QUAD 42mo+IM (Fluarix, Fluzone & Alfiuria Quad PF)   Vitamin B12    Standing Status:   Future    Standing Expiration Date:   05/21/2022   Intrinsic Factor Antibodies    Standing Status:   Future    Standing Expiration Date:   05/21/2022   POCT glycosylated hemoglobin (Hb A1C)     Patient Instructions  You are doing well today. Sugars are doing great! Continue ozempic and other medicines. Schedule lab visit in 1 month (non-fasting) for B12 check.  Return in 6 months for physical.   Follow up  plan: Return in about 6 months (around 11/19/2021) for annual exam, prior fasting for blood work.  Ria Bush, MD

## 2021-05-21 NOTE — Assessment & Plan Note (Addendum)
Chronic, stable period on ozempic - continue. Has lost a total of 75 lbs!

## 2021-05-23 ENCOUNTER — Other Ambulatory Visit: Payer: Self-pay | Admitting: Infectious Diseases

## 2021-05-23 DIAGNOSIS — B20 Human immunodeficiency virus [HIV] disease: Secondary | ICD-10-CM

## 2021-05-30 MED ORDER — BIKTARVY 50-200-25 MG PO TABS: 1.0000 | ORAL_TABLET | Freq: Every day | ORAL | 5 refills | Status: DC

## 2021-06-03 ENCOUNTER — Other Ambulatory Visit: Payer: Self-pay

## 2021-06-03 DIAGNOSIS — B20 Human immunodeficiency virus [HIV] disease: Secondary | ICD-10-CM

## 2021-06-03 MED ORDER — BIKTARVY 50-200-25 MG PO TABS: 1.0000 | ORAL_TABLET | Freq: Every day | ORAL | 5 refills | Status: DC

## 2021-06-12 DIAGNOSIS — S93522D Sprain of metatarsophalangeal joint of left great toe, subsequent encounter: Secondary | ICD-10-CM | POA: Diagnosis not present

## 2021-06-17 ENCOUNTER — Other Ambulatory Visit (HOSPITAL_COMMUNITY): Payer: Self-pay

## 2021-06-23 ENCOUNTER — Other Ambulatory Visit: Payer: Self-pay

## 2021-06-23 ENCOUNTER — Other Ambulatory Visit (INDEPENDENT_AMBULATORY_CARE_PROVIDER_SITE_OTHER): Payer: BC Managed Care – PPO

## 2021-06-23 DIAGNOSIS — E538 Deficiency of other specified B group vitamins: Secondary | ICD-10-CM | POA: Diagnosis not present

## 2021-06-23 LAB — VITAMIN B12: Vitamin B-12: 230 pg/mL (ref 211–911)

## 2021-06-27 ENCOUNTER — Telehealth: Payer: Self-pay

## 2021-06-27 ENCOUNTER — Other Ambulatory Visit (HOSPITAL_COMMUNITY): Payer: Self-pay

## 2021-06-27 LAB — INTRINSIC FACTOR ANTIBODIES: Intrinsic Factor: NEGATIVE

## 2021-06-27 NOTE — Telephone Encounter (Signed)
Received faxed PA request from [facility] for Ozempic 1 mg/dose, 4 mg/3 mL SOPN; key;  [id number].  Decision pending.

## 2021-06-28 ENCOUNTER — Other Ambulatory Visit: Payer: Self-pay | Admitting: Family Medicine

## 2021-06-28 MED ORDER — B-12 1000 MCG SL SUBL: 1.0000 | SUBLINGUAL_TABLET | Freq: Every day | SUBLINGUAL | Status: DC

## 2021-06-30 ENCOUNTER — Encounter: Payer: Self-pay | Admitting: Family Medicine

## 2021-07-04 ENCOUNTER — Other Ambulatory Visit: Payer: Self-pay

## 2021-07-04 DIAGNOSIS — B20 Human immunodeficiency virus [HIV] disease: Secondary | ICD-10-CM

## 2021-07-04 MED ORDER — BIKTARVY 50-200-25 MG PO TABS: 1.0000 | ORAL_TABLET | Freq: Every day | ORAL | 5 refills | Status: DC

## 2021-07-04 NOTE — Telephone Encounter (Signed)
Due to insurance changes this year patient ow using Manufacturing systems engineer for [employer]. Rx updated and send electronically.  Eugenia Mcalpine

## 2021-07-04 NOTE — Telephone Encounter (Signed)
Received faxed PA approval, valid 06/13/2021- 06/27/2022.   Made pharmacy aware.

## 2021-07-30 ENCOUNTER — Other Ambulatory Visit: Payer: Self-pay

## 2021-07-30 ENCOUNTER — Ambulatory Visit (INDEPENDENT_AMBULATORY_CARE_PROVIDER_SITE_OTHER): Payer: BC Managed Care – PPO

## 2021-07-30 DIAGNOSIS — E538 Deficiency of other specified B group vitamins: Secondary | ICD-10-CM

## 2021-07-30 MED ORDER — CYANOCOBALAMIN 1000 MCG/ML IJ SOLN
1000.0000 ug | Freq: Once | INTRAMUSCULAR | Status: AC
  Administered 2021-07-30: 1000 ug via INTRAMUSCULAR

## 2021-07-30 NOTE — Progress Notes (Signed)
Per orders of Dr. Letvak, injection of vit B12 given by Caridad Silveira. Patient tolerated injection well.  

## 2021-08-02 ENCOUNTER — Encounter: Payer: Self-pay | Admitting: Radiology

## 2021-08-05 ENCOUNTER — Other Ambulatory Visit (HOSPITAL_COMMUNITY): Payer: Self-pay

## 2021-08-21 ENCOUNTER — Ambulatory Visit: Payer: BC Managed Care – PPO | Admitting: Infectious Diseases

## 2021-08-26 ENCOUNTER — Telehealth: Payer: Self-pay | Admitting: Family Medicine

## 2021-08-26 MED ORDER — CYCLOBENZAPRINE HCL 5 MG PO TABS: 5.0000 mg | ORAL_TABLET | Freq: Two times a day (BID) | ORAL | 3 refills | Status: DC | PRN

## 2021-08-26 NOTE — Addendum Note (Signed)
Addended by: Ria Bush on: 08/26/2021 01:34 PM ? ? Modules accepted: Orders ? ?

## 2021-08-26 NOTE — Telephone Encounter (Signed)
?  Encourage patient to contact the pharmacy for refills or they can request refills through [facility] ? ?LAST APPOINTMENT DATE:  Please schedule appointment if longer than 1 year ? ?NEXT APPOINTMENT DATE: ? ?MEDICATION:cyclobenzaprine (FLEXERIL) 5 MG tablet ? ?Is the patient out of medication?  ? ?PHARMACY:[id number] [place] - [address] ? ?Let patient know to contact pharmacy at the end of the day to make sure medication is ready. ? ?Please notify patient to allow 48-72 hours to process ? ?CLINICAL FILLS OUT ALL BELOW:  ? ?LAST REFILL: ? ?QTY: ? ?REFILL DATE: ? ? ? ?OTHER COMMENTS:  ? ? ?Okay for refill? ? ?Please advise ? ? ?  ?

## 2021-08-26 NOTE — Telephone Encounter (Signed)
ERx 

## 2021-08-26 NOTE — Telephone Encounter (Signed)
Cyclobenzaprine ?Last rx:  07/05/20, #30 ?Last OV:  05/21/21, DM f/u ?Next OV: 11/21/21 CPE ?

## 2021-09-02 ENCOUNTER — Ambulatory Visit: Payer: BC Managed Care – PPO | Attending: Infectious Diseases | Admitting: Infectious Diseases

## 2021-09-02 ENCOUNTER — Other Ambulatory Visit
Admission: RE | Admit: 2021-09-02 | Discharge: 2021-09-02 | Disposition: A | Discharge status: Home / Self Care | Payer: BC Managed Care – PPO | Attending: Infectious Diseases | Admitting: Infectious Diseases

## 2021-09-02 ENCOUNTER — Other Ambulatory Visit: Payer: Self-pay

## 2021-09-02 ENCOUNTER — Encounter: Payer: Self-pay | Admitting: Infectious Diseases

## 2021-09-02 VITALS — BP 135/90 | HR 85 | Temp 98.6°F | Wt 271.0 lb

## 2021-09-02 DIAGNOSIS — Z7901 Long term (current) use of anticoagulants: Secondary | ICD-10-CM | POA: Insufficient documentation

## 2021-09-02 DIAGNOSIS — B20 Human immunodeficiency virus [HIV] disease: Secondary | ICD-10-CM | POA: Diagnosis not present

## 2021-09-02 DIAGNOSIS — E119 Type 2 diabetes mellitus without complications: Secondary | ICD-10-CM | POA: Insufficient documentation

## 2021-09-02 DIAGNOSIS — I10 Essential (primary) hypertension: Secondary | ICD-10-CM | POA: Insufficient documentation

## 2021-09-02 DIAGNOSIS — Z79899 Other long term (current) drug therapy: Secondary | ICD-10-CM | POA: Insufficient documentation

## 2021-09-02 DIAGNOSIS — E538 Deficiency of other specified B group vitamins: Secondary | ICD-10-CM | POA: Insufficient documentation

## 2021-09-02 LAB — COMPREHENSIVE METABOLIC PANEL
ALT: 12 U/L (ref 0–44)
AST: 19 U/L (ref 15–41)
Albumin: 3.8 g/dL (ref 3.5–5.0)
Alkaline Phosphatase: 56 U/L (ref 38–126)
Anion gap: 6 (ref 5–15)
BUN: 15 mg/dL (ref 6–20)
CO2: 30 mmol/L (ref 22–32)
Calcium: 9.2 mg/dL (ref 8.9–10.3)
Chloride: 102 mmol/L (ref 98–111)
Creatinine, Ser: 0.87 mg/dL (ref 0.44–1.00)
GFR, Estimated: >60 mL/min (ref >60)
Glucose, Bld: 92 mg/dL (ref 70–99)
Potassium: 4.7 mmol/L (ref 3.5–5.1)
Sodium: 138 mmol/L (ref 135–145)
Total Bilirubin: 0.7 mg/dL (ref 0.3–1.2)
Total Protein: 7 g/dL (ref 6.5–8.1)

## 2021-09-02 MED ORDER — BIKTARVY 50-200-25 MG PO TABS: 1.0000 | ORAL_TABLET | Freq: Every day | ORAL | 4 refills | Status: DC

## 2021-09-02 NOTE — Patient Instructions (Signed)
You are here for follow up . You are on [place] and doing well. Today will do labs and follow up 1 year ?

## 2021-09-02 NOTE — Progress Notes (Signed)
NAME: Cassandra [name]  ?DOB: Oct 22, 1975  ?MRN: [id number]  ?Date/Time: 09/02/2021 10:15 AM ? ? ?Subjective:  ?Here for follow up of HIV ?Here with her partner who is also positive and a patient of mine ?Since her last visit in June 2022 she had a surgery to the left foot- arthrodesis of 1st MTP by Dr.Viens following trauma ?80 pound weight loss since starting ozempic ?She is 100% adherent to [employer] ?Last Vl < 20 and Cd4 1178 on 11/19/20 ?Weight loss on ozempic 357>287 ?Had hysterectomy/b salpingectomy in [place] ?Doing well ? ? ?Medical history ?HIV diagnosed 2013 ?Nadir Cd4 >200 ?OI none ?HAARt history: first regimen Atripla ?2nd regimen Biktarvy since Sept 2018 ? ?Acquired thru heterosexual contact. Husband HIV positive and on Rx ? ? ?Past medical history ?Allergies to animal dander ?Gestational diabetes mellitus ?Diabetes mellitus ?GERD ?Hypertension ?Obesity ?Varicose veins ?Migraine ?Endometrial ablation for menorrhagia ?Hysterectomy ?Gall stones ?Depression  ?Tubular adenoma colon.  Colonoscopy done in January 2020. ? ? ?Past Surgical History:  ?Procedure Laterality Date  ? BIOPSY  07/04/2018  ? Procedure: BIOPSY;  Surgeon: Thornton Park, MD;  Location: [facility];  Service: Gastroenterology;;  ? CESAREAN SECTION  [phone]  ? x2  ? CHOLECYSTECTOMY  2018  ? COLONOSCOPY WITH PROPOFOL N/A 07/04/2018  ? for chronic diarrhea after cholecystectomy started on colestipol with benefit ([place]). Thought she has IBS-D. 2 TA, rpt 5 yrs  ? CYSTOSCOPY N/A 08/20/2020  ? Procedure: CYSTOSCOPY;  Surgeon: Aletha Halim, MD;  Location: [facility];  Service: Gynecology;  Laterality: N/A;  ? ENDOMETRIAL BIOPSY  01/25/2020  ?    ? HYSTERECTOMY ABDOMINAL WITH SALPINGECTOMY Bilateral 08/20/2020  ? done for heavy bleed and pelvic pain - pathology showed CIN1 of cervix Ilda Basset, Eduard Clos, MD)  ? HYSTEROSCOPY WITH D & C N/A 01/05/2018  ? [place]a for heavy bleeding, IUD removed - [place]; [name], MD  ? [place]    ? Mirena  ? POLYPECTOMY  07/04/2018  ? Procedure: POLYPECTOMY;  Surgeon: Thornton Park, MD;  Location: [facility];  Service: Gastroenterology;;  ? TOTAL LAPAROSCOPIC HYSTERECTOMY WITH SALPINGECTOMY Bilateral 08/20/2020  ? Procedure: ATTEMPTED TOTAL LAPAROSCOPIC HYSTERECTOMY WITH SALPINGECTOMY;  Surgeon: Aletha Halim, MD;  Location: [facility];  Service: Gynecology;  Laterality: Bilateral;  ? TUBAL LIGATION    ? WISDOM TOOTH EXTRACTION    ? x 4  ?  ? ?SH ?Current smoker ?Occasional alcohol ?Lives with her husband [name] female partner  and 2 sons ? ?  ?Family History  ?Problem Relation Age of Onset  ? Hypertension Mother   ? Hypertension Father   ? Appendicitis Father   ? Atrial fibrillation Father   ? Diabetes Maternal Grandmother   ?     s/p amputation  ? Hyperlipidemia Maternal Grandmother   ? Hypertension Maternal Grandmother   ? Stroke Maternal Grandmother   ? Hyperlipidemia Maternal Grandfather   ? Hypertension Maternal Grandfather   ? Skin cancer Maternal Grandfather   ? Diabetes Maternal Grandfather   ? Seizures Paternal Grandmother 69  ?     deceased  ? Coronary artery disease Paternal Grandfather 19  ?     MI  ? Breast cancer Neg Hx   ? Colon cancer Neg Hx   ? Colon polyps Neg Hx   ? Esophageal cancer Neg Hx   ? Rectal cancer Neg Hx   ? Stomach cancer Neg Hx   ? ?Allergies  ?Allergen Reactions  ?  Metformin And Related Diarrhea  ? Nickel Rash  ? ?? ?Current Outpatient Medications  ?Medication Sig Dispense Refill  ? Accu-Chek FastClix Lancets MISC 1 each by Does not apply route as directed. Use as directed to check blood sugar twice daily as needed. E11.8 102 each 3  ? acetaminophen (TYLENOL) 500 MG tablet Take 2 tablets (1,000 mg total) by mouth every 8 (eight) hours as needed for mild pain, moderate pain, fever or headache. 50 tablet 0  ? amLODipine (NORVASC) 5 MG tablet TAKE 1 TABLET BY MOUTH EVERY DAY 90 tablet 3  ? BIKTARVY 50-200-25  MG TABS tablet Take 1 tablet by mouth at bedtime. 30 tablet 5  ? bisacodyl (DULCOLAX) 5 MG EC tablet Take 1 tablet (5 mg total) by mouth daily as needed for moderate constipation. 14 tablet 0  ? citalopram (CELEXA) 20 MG tablet TAKE 1 TABLET BY MOUTH EVERY DAY 90 tablet 3  ? colestipol (COLESTID) 1 g tablet TAKE 2 TABLETS (2 G TOTAL) BY MOUTH 2 (TWO) TIMES DAILY. (Patient taking differently: Take 1 g by mouth daily.) 360 tablet 0  ? cyanocobalamin (,VITAMIN B-12,) 1000 MCG/ML injection Inject 1 mL (1,000 mcg total) into the muscle every 30 (thirty) days.    ? cyclobenzaprine (FLEXERIL) 5 MG tablet Take 1 tablet (5 mg total) by mouth 2 (two) times daily as needed (headache (sedation precautions)). 30 tablet 3  ? diphenhydrAMINE (BENADRYL) 25 MG tablet Take 25 mg by mouth at bedtime as needed for allergies or sleep.    ? glucose blood (ACCU-CHEK GUIDE) test strip 1 each by Other route as needed for other. Use as instructed to check blood sugar 100 each 0  ? lisinopril (ZESTRIL) 10 MG tablet TAKE 1 TABLET BY MOUTH EVERY DAY 90 tablet 2  ? loratadine (CLARITIN) 10 MG tablet Take 10 mg by mouth daily as needed for allergies.    ? OZEMPIC, 1 MG/DOSE, 4 MG/3ML SOPN INJECT 0.75 MLS (1 MG TOTAL) INTO THE SKIN ONCE A WEEK. 9 mL 3  ? pantoprazole (PROTONIX) 40 MG tablet TAKE 1 TABLET BY MOUTH TWICE A DAY 180 tablet 0  ? Peppermint Oil (IBGARD PO) Take 2 tablets by mouth daily as needed (diarrhea).    ? Cyanocobalamin (B-12) 1000 MCG SUBL Place 1 tablet under the tongue daily.    ? meloxicam (MOBIC) 15 MG tablet Take 1 tablet (15 mg total) by mouth daily.    ? simethicone (MYLICON) 80 MG chewable tablet Chew 1 tablet (80 mg total) by mouth 4 (four) times daily as needed for flatulence. 30 tablet 0  ? ?No current facility-administered medications for this visit.  ?  ?REVIEW OF SYSTEMS:  ?Const: negative fever, negative chills, 30 pound weight loss ?Eyes: negative diplopia or visual changes, negative eye pain ?ENT: negative  coryza, negative sore throat ?Resp: negative cough, hemoptysis, dyspnea ?Cards: negative for chest pain, palpitations, lower extremity edema ?GU: negative for frequency, dysuria and hematuria ?Skin: negative for rash and pruritus ?Heme: negative for easy bruising and gum/nose bleeding ?MS: negative for myalgias, arthralgias, back pain and muscle weakness ?Neurolo:headaches much better dizziness, NO vertigo, memory problems  ?Psych:stress  ? ?Objective:  ?VITALS:  ?BP 135/90   Pulse 85   Temp 98.6 ?F (37 ?C) (Temporal)   Wt 271 lb (122.9 kg)   LMP 07/10/2020   BMI 42.44 kg/m?   Weight today is 306 ?PHYSICAL EXAM:  ?General:well ?Oral cavity no lesions ?Neck supple ?No lymphadenopathy ?Lungs: Clear to auscultation bilaterally. No Wheezing  or Rhonchi. No rales. ?Heart: Regular rate and rhythm, no murmur, rub or gallop. ?Abdomen: Soft, non-tender,not distended. Bowel sounds normal. No masses ?Scar healed well ?Extremities: Extremities normal, atraumatic, no cyanosis. No edema. No clubbing ?Skin: No rashes or lesions. Not Jaundiced ?Lymph: Cervical, supraclavicular normal. ?Neurologic: Grossly non-focal ?Pertinent Labs ?Health maintenance: ? ?Vaccination ? ?Vaccine Date last given comment  ?Influenza 05/18/19   ?Hepatitis B 3/10, 5/19 and 02/16/19   ?Hepatitis A 08/16/18 and 02/16/19   ?Prevnar-PCV-13    ?Pneumovac-PPSV-23 01/04/15   ?TdaP 12/14/12   ?HPV    ?Shingrix ( zoster vaccine)    ? ?______________________ ? ?Labs ?Lab Result  Date comment  ?HIV VL  <20    ?CD4 1263 03/2020   ?Genotype     ?[id number] Neg 08/16/2018   ?HIV antibody     ?RPR  nonreactive 11/16/19   ?Quantiferon Gold  negative 11/16/19   ?Hep C ab Neg 03/30/18   ?Hepatitis B-ab,ag,c Sag-Neg, Sab neg ?Cab neg 03/30/18   ?Hepatitis A-IgM, IgG /T Neg 03/30/18   ?Lipid TC 170, HDL 41, LDL 108 ?TGL 99 11/26/17   ?GC/CHL neg 01/25/20   ?PAP High risk HPV + 01/25/20   ?     ? ? ?Preventive  ?Procedure Result  Date comment  ?colonoscopy Tubular adenoma 07/03/18    ?Mammogram Normal 11/26/17   ?Dental exam     ?Opthal     ? ? ?Impression/Recommendation ?46 y.o. female with a history of HIV, hypertension  ?             ?? ??HIV under good control.  On Biktarvy.  100% adherent.  V

## 2021-09-03 LAB — T-HELPER CELLS CD4/CD8 %
% CD 4 Pos. Lymph.: 53 % (ref 30.8–58.5)
Absolute CD 4 Helper: 1060 /uL (ref 359–1519)
Basophils Absolute: 0 10*3/uL (ref 0.0–0.2)
Basos: 1 %
CD3+CD4+ Cells/CD3+CD8+ Cells Bld: 2.22 (ref 0.92–3.72)
CD3+CD8+ Cells # Bld: 478 /uL (ref 109–897)
CD3+CD8+ Cells NFr Bld: 23.9 % (ref 12.0–35.5)
EOS (ABSOLUTE): 0.2 10*3/uL (ref 0.0–0.4)
Eos: 2 %
Hematocrit: 40.7 % (ref 34.0–46.6)
Hemoglobin: 13.8 g/dL (ref 11.1–15.9)
Immature Grans (Abs): 0 10*3/uL (ref 0.0–0.1)
Immature Granulocytes: 0 %
Lymphocytes Absolute: 2 10*3/uL (ref 0.7–3.1)
Lymphs: 25 %
MCH: 32.9 pg (ref 26.6–33.0)
MCHC: 33.9 g/dL (ref 31.5–35.7)
MCV: 97 fL (ref 79–97)
Monocytes Absolute: 0.4 10*3/uL (ref 0.1–0.9)
Monocytes: 5 %
Neutrophils Absolute: 5.3 10*3/uL (ref 1.4–7.0)
Neutrophils: 67 %
Platelets: 301 10*3/uL (ref 150–450)
RBC: 4.2 x10E6/uL (ref 3.77–5.28)
RDW: 11.7 % (ref 11.7–15.4)
WBC: 7.9 10*3/uL (ref 3.4–10.8)

## 2021-09-03 LAB — HIV-1 RNA QUANT-NO REFLEX-BLD
HIV 1 RNA Quant: <20 copies/mL
LOG10 HIV-1 RNA: UNDETERMINED log10copy/mL

## 2021-09-03 LAB — RPR: RPR Ser Ql: NONREACTIVE

## 2021-09-04 ENCOUNTER — Ambulatory Visit: Payer: BC Managed Care – PPO

## 2021-09-04 LAB — QUANTIFERON-TB GOLD PLUS (RQFGPL)
QuantiFERON Mitogen Value: >10 IU/mL
QuantiFERON Nil Value: 0.02 IU/mL
QuantiFERON TB1 Ag Value: 0.04 IU/mL
QuantiFERON TB2 Ag Value: 0.02 IU/mL

## 2021-09-04 LAB — QUANTIFERON-TB GOLD PLUS: QuantiFERON-TB Gold Plus: NEGATIVE

## 2021-09-05 ENCOUNTER — Telehealth: Payer: Self-pay

## 2021-09-05 NOTE — Telephone Encounter (Signed)
Patient informed of lab results. Patient verbalized understanding.  ?

## 2021-09-05 NOTE — Telephone Encounter (Signed)
-----   Message from Tsosie Billing, MD sent at 09/05/2021 12:02 PM EDT ----- ?Please let her know that all labs look good ? ?----- Message ----- ?From: Interface[facility] [place] ?Sent: 09/02/2021  12:20 PM EDT ?To: Tsosie Billing, MD ? ? ?

## 2021-10-22 IMAGING — US US PELVIS COMPLETE WITH TRANSVAGINAL
1 series · 13 of 25 positions shown · non-contrast
Comparison: 11/02/2012

CLINICAL DATA: Abnormal uterine bleeding, history of endometrial
ablation at, LMP 12/21/2019, still bleeding

EXAM:
TRANSABDOMINAL AND TRANSVAGINAL ULTRASOUND OF PELVIS
TECHNIQUE: Both transabdominal and transvaginal ultrasound examinations of the
pelvis were performed. Transabdominal technique was performed for
global imaging of the pelvis including uterus, ovaries, adnexal
regions, and pelvic cul-de-sac. It was necessary to proceed with
endovaginal exam following the transabdominal exam to visualize the
endometrium and ovaries. Transvaginal imaging is severely limited by
bowel and shadowing.

[Series 1: us pelvic complete with transvaginal · 13 of 28 slices shown]
[im 1/28]
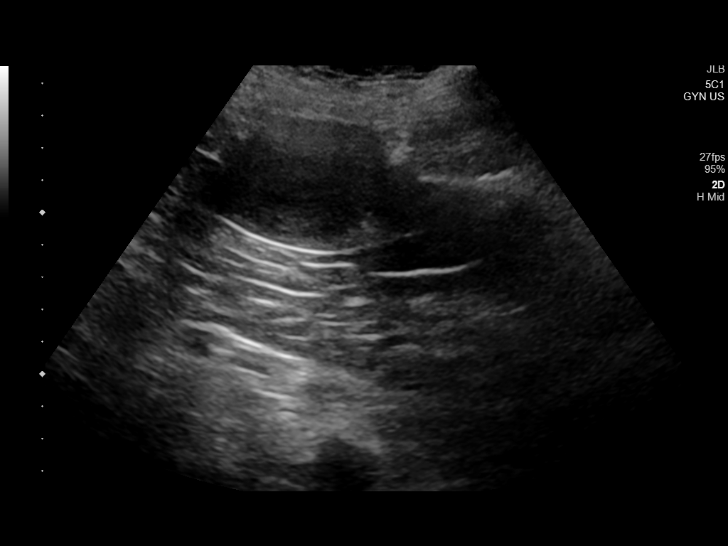
[im 3/28]
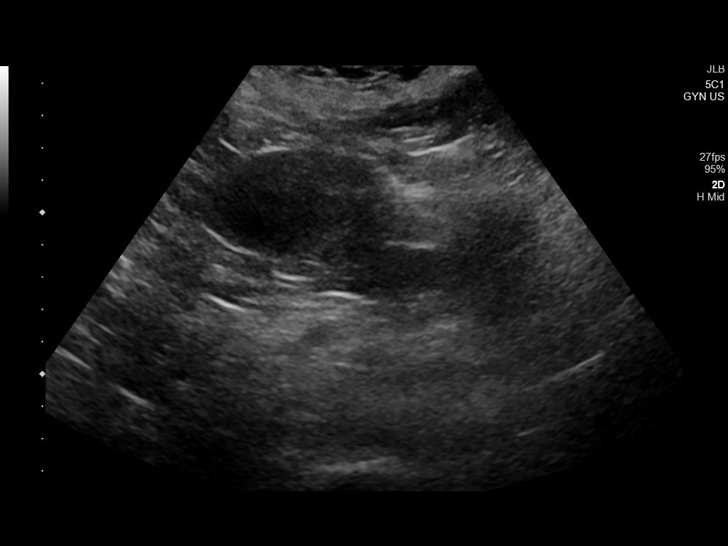
[im 5/28]
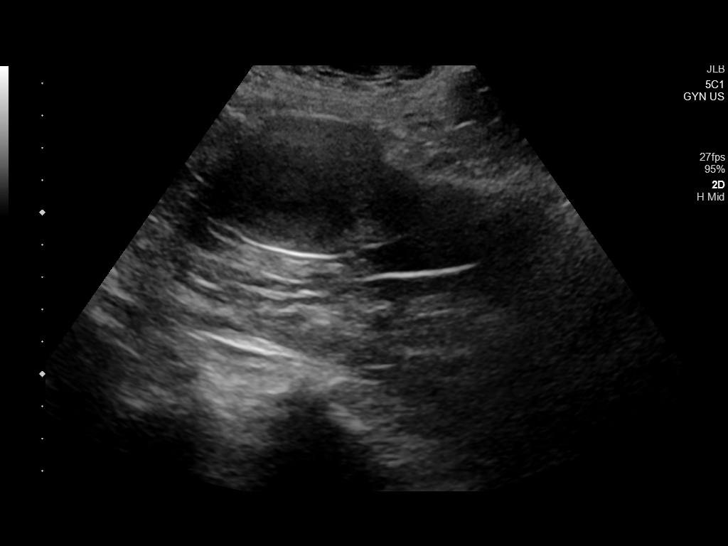
[im 7/28]
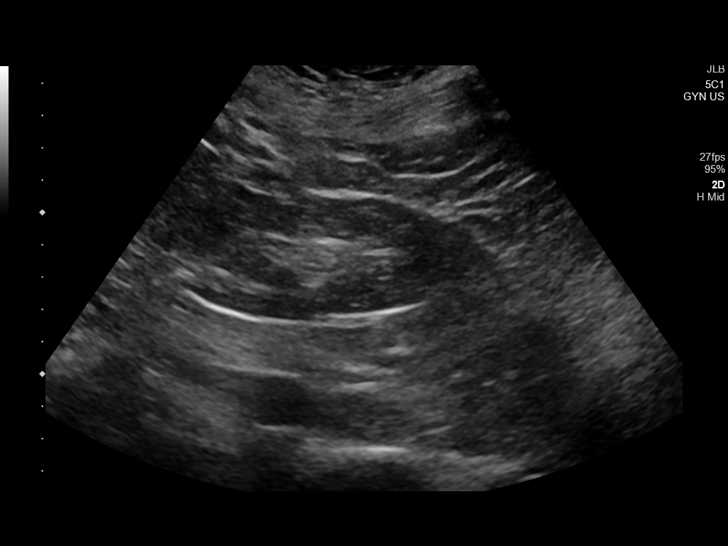
[im 10/28]
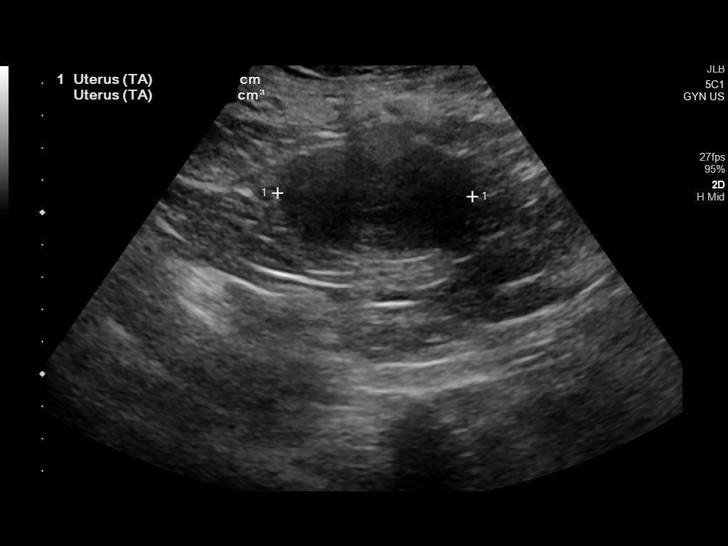
[im 12/28]
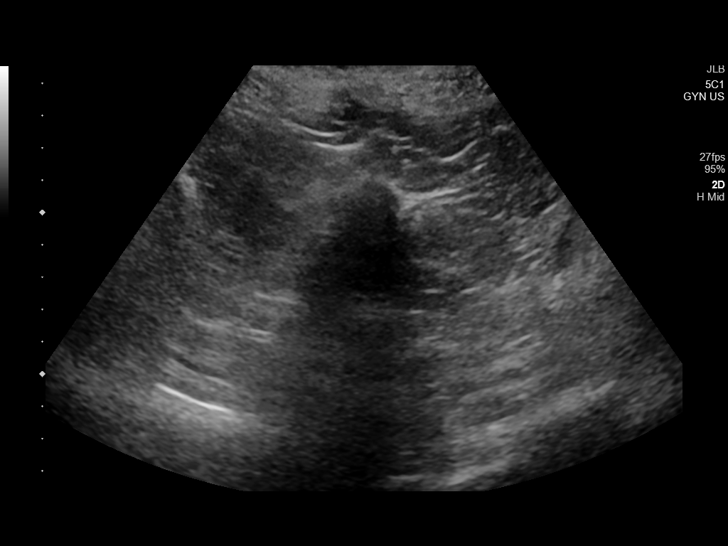
[im 14/28]
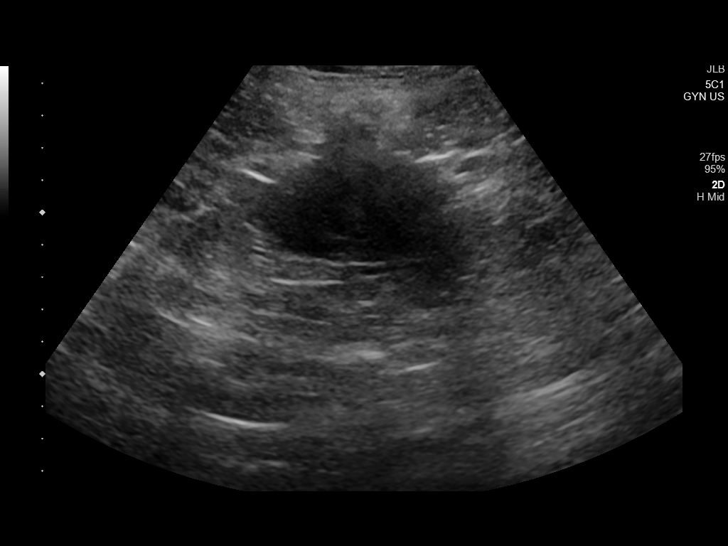
[im 16/28]
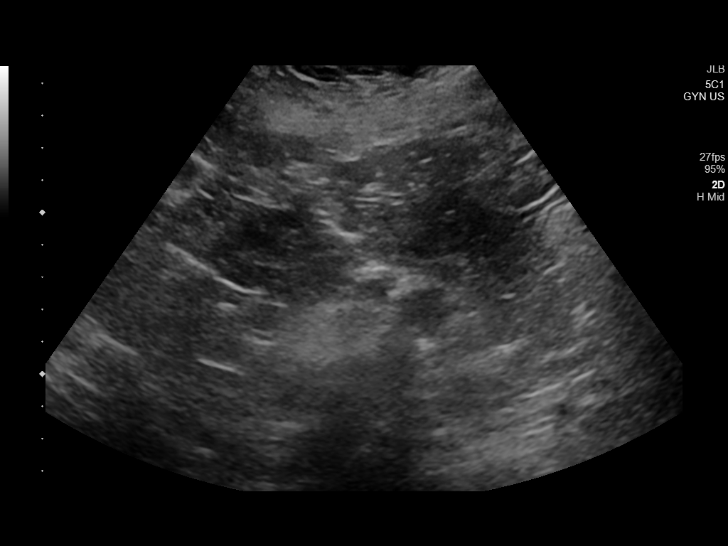
[im 19/28]
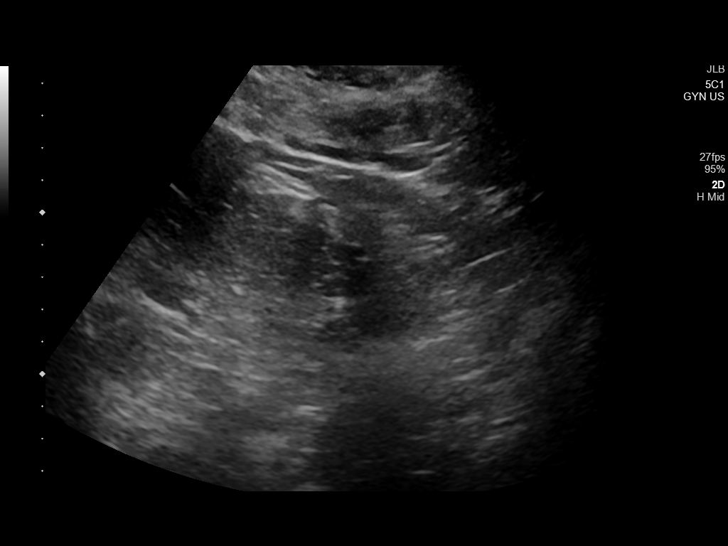
[im 21/28]
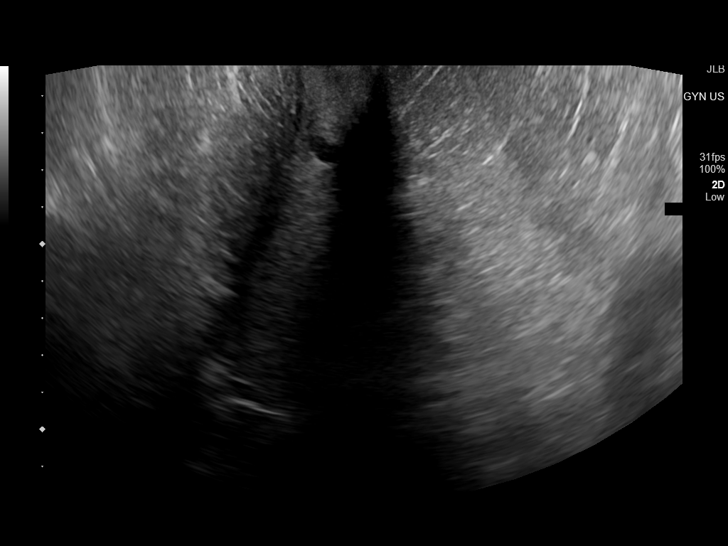
[im 23/28]
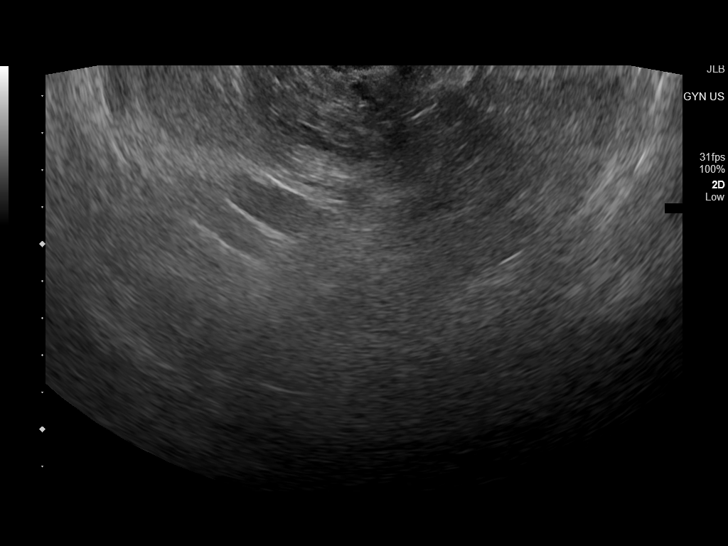
[im 25/28]
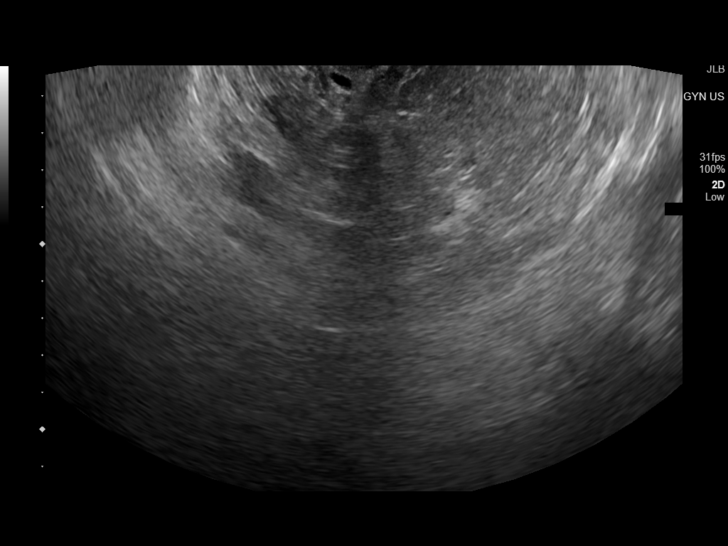
[im 28/28]
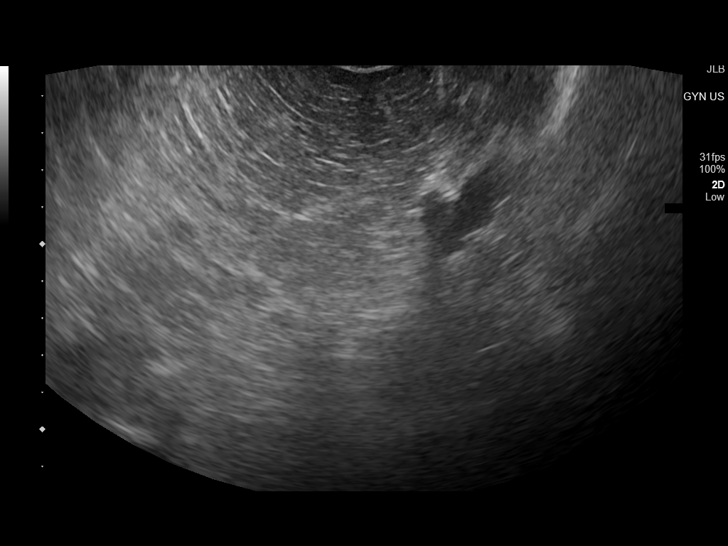

[13 of 25 positions shown; findings below may reference images not displayed]

FINDINGS: Uterus

Measurements: 10.1 x 4.3 x 6.0 cm = volume: 135 mL. Grossly normal
morphology without mass

Endometrium

Thickness: 9 mm. No endometrial fluid or mass grossly identified on
transabdominal imaging, limited, and nonvisualized on transvaginal
imaging.

Right ovary

Not visualized, likely obscured by bowel

Left ovary

Not visualized, likely obscured by bowel

Other findings

No free pelvic fluid or adnexal masses.
IMPRESSION: Significantly limited exam due to bowel with nondiagnostic
endovaginal imaging.

Nonvisualization of ovaries.

Otherwise grossly unremarkable uterus and endometrial complex.

## 2021-10-26 ENCOUNTER — Other Ambulatory Visit: Payer: Self-pay | Admitting: Family Medicine

## 2021-10-28 ENCOUNTER — Other Ambulatory Visit: Payer: Self-pay

## 2021-10-28 ENCOUNTER — Encounter: Payer: Self-pay | Admitting: Emergency Medicine

## 2021-10-28 ENCOUNTER — Ambulatory Visit
Admission: EM | Admit: 2021-10-28 | Discharge: 2021-10-28 | Disposition: A | Discharge status: Home / Self Care | Payer: BC Managed Care – PPO | Attending: Emergency Medicine | Admitting: Emergency Medicine

## 2021-10-28 DIAGNOSIS — G5602 Carpal tunnel syndrome, left upper limb: Secondary | ICD-10-CM | POA: Diagnosis not present

## 2021-10-28 MED ORDER — MELOXICAM 7.5 MG PO TABS: 7.5000 mg | ORAL_TABLET | Freq: Every day | ORAL | 0 refills | Status: DC

## 2021-10-28 MED ORDER — KETOROLAC TROMETHAMINE 60 MG/2ML IM SOLN
30.0000 mg | Freq: Once | INTRAMUSCULAR | Status: AC
  Administered 2021-10-28: 30 mg via INTRAMUSCULAR

## 2021-10-28 NOTE — ED Triage Notes (Signed)
Since last Thursday or Friday noticed swelling and numbness in left hand at base of left middle and ring finger.  Patient reports pain radiates to ulnas aspect of left wrist.  Patient is right handed.  No known injury

## 2021-10-28 NOTE — ED Provider Notes (Signed)
[facility]    CSN: [id number] Arrival date & time: 10/28/21  1247      History   Chief Complaint Chief Complaint  Patient presents with   Extremity Pain    Left wrist and knuckle pain. Slight swelling - Entered by patient    HPI Cassandra [name] is a 46 y.o. female.   Patient presents with pain to the left wrist extending to the third and fourth finger beginning 6 days ago.  Symptoms were initially noticed when she attempted to place hands on hips. associated tingling and numbness intermittently.  Symptoms are worsening with certain placement of the wrist.  Range of motion is intact.  Denies precipitating event, trauma or injury.  Has attempted use of a wrist brace which has been ineffective.  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  Patient is right-handed   Past Medical History:  Diagnosis Date   Allergy    Allergy to animal dander    cats and dogs   Anxiety    Arthritis    Back   Complication of anesthesia    Pt does not want an epidural, have had reactions to that medication   Constipation    alternating from constipation to diarrhea   COVID-19 07/12/2020   Depression    Diabetes mellitus without complication ([place])    Type II   Gallstones 06/2016   by xray - surgery to remove   Generalized headaches    frequent   GERD (gastroesophageal reflux disease)    History of abnormal Pap smear    remote   History of gestational diabetes    first 2 pregnancies   History of kidney stones    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    Hypertension    IBS (irritable bowel syndrome)    Morbid obesity ([place])    Periodontal disease 08/2011   currently getting dental work   Tobacco use     Patient Active Problem List   Diagnosis Date Noted   Insect bite 01/21/2021   Dysplasia of cervix, low grade (CIN 1) 09/26/2020   COVID-19 07/13/2020   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   Migraine 06/30/2019    Vitamin B12 deficiency 12/31/2018   GERD (gastroesophageal reflux disease) 12/27/2018   Polyp of descending colon    Chronic diarrhea 04/04/2018   Closed compression fracture of L1 lumbar vertebra 07/05/2016   Lumbar pain with radiation down both legs 12/20/2015   Health maintenance examination 05/25/2014   Ulnar neuropathy 06/28/2013   Hypertension 09/14/2012   HIV (human immunodeficiency virus infection) ([place]) 01/04/2012   Varicose vein 01/01/2012   MDD (major depressive disorder), recurrent episode, moderate ([place]) 09/02/2011   Body mass index (BMI) of 40.0 to 44.9 in adult [facility])    Ex-smoker    Controlled diabetes mellitus type 2 with complications [facility])     Past Surgical History:  Procedure Laterality Date   BIOPSY  07/04/2018   Procedure: BIOPSY;  Surgeon: Thornton Park, MD;  Location: [facility];  Service: Gastroenterology;;   CESAREAN SECTION  [phone]   x2   CHOLECYSTECTOMY  2018   COLONOSCOPY WITH PROPOFOL N/A 07/04/2018   for chronic diarrhea after cholecystectomy started on colestipol with benefit ([place]). Thought she has IBS-D. 2 TA, rpt 5 yrs   CYSTOSCOPY N/A 08/20/2020   Procedure: CYSTOSCOPY;  Surgeon: Aletha Halim, MD;  Location: [place];  Service: Gynecology;  Laterality: N/A;   ENDOMETRIAL BIOPSY  01/25/2020       HYSTERECTOMY ABDOMINAL WITH SALPINGECTOMY Bilateral 08/20/2020   done for heavy bleed and pelvic pain - pathology showed CIN1 of cervix Ilda Basset, Eduard Clos, MD)   HYSTEROSCOPY WITH D & C N/A 01/05/2018   Minerva for heavy bleeding, IUD removed - DILATATION AND CURETTAGE /HYSTEROSCOPY WITH MINERVA ABLATION; [name], MD   INTRAUTERINE DEVICE INSERTION     Mirena   POLYPECTOMY  07/04/2018   Procedure: POLYPECTOMY;  Surgeon: Thornton Park, MD;  [facility];  Service: Gastroenterology;;   TOTAL LAPAROSCOPIC HYSTERECTOMY WITH SALPINGECTOMY Bilateral 08/20/2020   Procedure: ATTEMPTED TOTAL LAPAROSCOPIC HYSTERECTOMY WITH SALPINGECTOMY;   Surgeon: Aletha Halim, MD;  Location: [place];  Service: Gynecology;  Laterality: Bilateral;   TUBAL LIGATION     WISDOM TOOTH EXTRACTION     x 4    OB History     Gravida  2   Para  2   Term  2   Preterm  0   AB  0   Living  2      SAB  0   IAB  0   Ectopic  0   Multiple  0   Live Births  2            Home Medications    Prior to Admission medications   Medication Sig Start Date End Date Taking? Authorizing Provider  Accu-Chek FastClix Lancets MISC 1 each by Does not apply route as directed. Use as directed to check blood sugar twice daily as needed. E11.8 10/03/19   Ria Bush, MD  acetaminophen (TYLENOL) 500 MG tablet Take 2 tablets (1,000 mg total) by mouth every 8 (eight) hours as needed for mild pain, moderate pain, fever or headache. 03/03/21 03/03/22  Alfred Levins, [place], MD  amLODipine (NORVASC) 5 MG tablet TAKE 1 TABLET BY MOUTH EVERY DAY 01/23/21   Ria Bush, MD  BIKTARVY 50-200-25 MG TABS tablet Take 1 tablet by mouth at bedtime. 09/02/21   Tsosie Billing, MD  bisacodyl (DULCOLAX) 5 MG EC tablet Take 1 tablet (5 mg total) by mouth daily as needed for moderate constipation. 08/22/20   Aletha Halim, MD  citalopram (CELEXA) 20 MG tablet TAKE 1 TABLET BY MOUTH EVERY DAY 10/27/21   Ria Bush, MD  colestipol (COLESTID) 1 g tablet TAKE 2 TABLETS (2 G TOTAL) BY MOUTH 2 (TWO) TIMES DAILY. Patient taking differently: Take 1 g by mouth daily. 05/23/19   Thornton Park, MD  cyanocobalamin (,VITAMIN B-12,) 1000 MCG/ML injection Inject 1 mL (1,000 mcg total) into the muscle every 30 (thirty) days. 06/28/21   Ria Bush, MD  Cyanocobalamin (B-12) 1000 MCG SUBL Place 1 tablet under the tongue daily. 06/28/21   Ria Bush, MD  cyclobenzaprine (FLEXERIL) 5 MG tablet Take 1 tablet (5 mg total) by mouth 2 (two) times daily as needed (headache (sedation precautions)). 08/26/21   Ria Bush, MD  diphenhydrAMINE (BENADRYL)  25 MG tablet Take 25 mg by mouth at bedtime as needed for allergies or sleep.    [provider]  glucose blood (ACCU-CHEK GUIDE) test strip 1 each by Other route as needed for other. Use as instructed to check blood sugar 01/17/19   Ria Bush, MD  lisinopril (ZESTRIL) 10 MG tablet TAKE 1 TABLET BY MOUTH EVERY DAY 03/25/21   Ria Bush, MD  loratadine (CLARITIN) 10 MG tablet Take 10 mg by mouth daily as needed for allergies.    [provider]  OZEMPIC, 1 MG/DOSE, 4 MG/3ML SOPN INJECT 0.75 MLS (1 MG TOTAL) INTO THE SKIN ONCE A WEEK. 03/25/21   Ria Bush, MD  pantoprazole (PROTONIX) 40 MG tablet TAKE 1 TABLET BY MOUTH TWICE A DAY 07/04/20   Thornton Park, MD  Peppermint Oil (IBGARD PO) Take 2 tablets by mouth daily as needed (diarrhea).    [provider]  simethicone (MYLICON) 80 MG chewable tablet Chew 1 tablet (80 mg total) by mouth 4 (four) times daily as needed for flatulence. 08/22/20   Aletha Halim, MD    Family History Family History  Problem Relation Age of Onset   Hypertension Mother    Hypertension Father    Appendicitis Father    Atrial fibrillation Father    Diabetes Maternal Grandmother        s/p amputation   Hyperlipidemia Maternal Grandmother    Hypertension Maternal Grandmother    Stroke Maternal Grandmother    Hyperlipidemia Maternal Grandfather    Hypertension Maternal Grandfather    Skin cancer Maternal Grandfather    Diabetes Maternal Grandfather    Seizures Paternal Grandmother 61       deceased   Coronary artery disease Paternal Grandfather 34       MI   Breast cancer Neg Hx    Colon cancer Neg Hx    Colon polyps Neg Hx    Esophageal cancer Neg Hx    Rectal cancer Neg Hx    Stomach cancer Neg Hx     Social History Social History   Tobacco Use   Smoking status: Some Days    Packs/day: 0.10    Years: 23.00    Pack years: 2.30    Types: Cigarettes   Smokeless tobacco: Never   Tobacco comments:     1 every now and then  Vaping Use   Vaping Use: Never used  Substance Use Topics   Alcohol use: Yes    Alcohol/week: 0.0 standard drinks    Comment: occasional   Drug use: No     Allergies   Metformin and related and Nickel   Review of Systems Review of Systems  Constitutional: Negative.   Respiratory: Negative.    Cardiovascular: Negative.   Musculoskeletal:  Positive for joint swelling. Negative for arthralgias, back pain, gait problem, myalgias, neck pain and neck stiffness.  Skin: Negative.   Neurological: Negative.     Physical Exam Triage Vital Signs ED Triage Vitals  Enc Vitals Group     BP 10/28/21 1345 130/88     Pulse Rate 10/28/21 1345 94     Resp 10/28/21 1345 20     Temp 10/28/21 1345 98.1 F (36.7 C)     Temp Source 10/28/21 1345 Oral     SpO2 10/28/21 1345 95 %     Weight --      Height --      Head Circumference --      Peak Flow --      Pain Score 10/28/21 1343 6     Pain Loc --      Pain Edu? --      Excl. in [place]? --    No data found.  Updated Vital Signs BP 130/88 (BP Location: Left Arm) Comment (BP Location): regular cuff on forearm  Pulse 94   Temp 98.1 F (36.7 C) (Oral)   Resp 20   LMP 07/10/2020   SpO2 95%   Visual Acuity Right Eye Distance:   Left Eye Distance:   Bilateral Distance:  Right Eye Near:   Left Eye Near:    Bilateral Near:     Physical Exam Constitutional:      Appearance: Normal appearance.  HENT:     Head: Normocephalic.  Eyes:     Extraocular Movements: Extraocular movements intact.  Pulmonary:     Effort: Pulmonary effort is normal.  Musculoskeletal:     Comments: Tenderness is present at the volar aspect of the left wrist without point tenderness tenderness present at the base of the third and fourth metacarpal with mild swelling noted at the base of the fourth metacarpal extending into the proximal phalanx, 2+ radial pulse, sensation intact, capillary refill less than 3, positive Tinel's and  Phalen   Neurological:     Mental Status: She is alert and oriented to person, place, and time. Mental status is at baseline.  Psychiatric:        Mood and Affect: Mood normal.        Behavior: Behavior normal.     UC Treatments / Results  Labs (all labs ordered are listed, but only abnormal results are displayed) Labs Reviewed - No data to display  EKG   Radiology No results found.  Procedures Procedures (including critical care time)  Medications Ordered in UC Medications - No data to display  Initial Impression / Assessment and Plan / UC Course  I have reviewed the triage vital signs and the nursing notes.  Pertinent labs & imaging results that were available during my care of the patient were reviewed by me and considered in my medical decision making (see chart for details).  Carpal tunnel syndrome of left wrist  Symptomology and presentation is consistent with carpal tunnel, discussed with patient, Toradol injection given in office scribe meloxicam daily for 5 days then as needed for outpatient management, may use Tylenol in addition, recommended RICE, heat with activity as tolerated, given walking referral to orthopedics if symptoms continue to persist or recur, work note given Final Clinical Impressions(s) / UC Diagnoses   Final diagnoses:  None   Discharge Instructions   None    ED Prescriptions   None    PDMP not reviewed this encounter.   Hans Eden, NP 10/28/21 1744

## 2021-10-28 NOTE — Discharge Instructions (Addendum)
I believe your symptoms today are related to carpal tunnel of your wrist, information is inside of your packet to provide general information about this condition  You have been given an injection of Toradol here in office, this medication helps to reduce inflammation which in turn will help with your discomfort  Take meloxicam daily with food for the next 5 days, then you may use as needed, this is to help continue to process to reduce inflammation, you may use 500 to 1000 mg of Tylenol every 6 hours in addition  Continue to wear your wrist brace when pain is flared to provide additional comfort and support  You may place ice or heat over the affected area in 10 to 15-minute intervals as needed\ You You may wrist hand and wrist on 2 pillows while sitting and lying for comfort and additional support  If your symptoms continue to persist or recur you may follow-up with orthopedics for further evaluation and management

## 2021-11-10 ENCOUNTER — Other Ambulatory Visit: Payer: Self-pay | Admitting: Family Medicine

## 2021-11-10 DIAGNOSIS — E118 Type 2 diabetes mellitus with unspecified complications: Secondary | ICD-10-CM

## 2021-11-10 DIAGNOSIS — E538 Deficiency of other specified B group vitamins: Secondary | ICD-10-CM

## 2021-11-13 ENCOUNTER — Other Ambulatory Visit (INDEPENDENT_AMBULATORY_CARE_PROVIDER_SITE_OTHER): Payer: BC Managed Care – PPO

## 2021-11-13 ENCOUNTER — Other Ambulatory Visit: Payer: Self-pay | Admitting: *Deleted

## 2021-11-13 DIAGNOSIS — E538 Deficiency of other specified B group vitamins: Secondary | ICD-10-CM

## 2021-11-13 DIAGNOSIS — E118 Type 2 diabetes mellitus with unspecified complications: Secondary | ICD-10-CM

## 2021-11-13 LAB — BASIC METABOLIC PANEL
BUN: 10 mg/dL (ref 6–23)
CO2: 28 mEq/L (ref 19–32)
Calcium: 8.5 mg/dL (ref 8.4–10.5)
Chloride: 105 mEq/L (ref 96–112)
Creatinine, Ser: 0.9 mg/dL (ref 0.40–1.20)
GFR: 77.85 mL/min (ref >60.00)
Glucose, Bld: 82 mg/dL (ref 70–99)
Potassium: 4.1 mEq/L (ref 3.5–5.1)
Sodium: 140 mEq/L (ref 135–145)

## 2021-11-13 LAB — LIPID PANEL
Cholesterol: 165 mg/dL (ref 0–200)
HDL: 44.3 mg/dL (ref >39.00)
LDL Cholesterol: 97 mg/dL (ref 0–99)
NonHDL: 120.34
Total CHOL/HDL Ratio: 4
Triglycerides: 118 mg/dL (ref 0.0–149.0)
VLDL: 23.6 mg/dL (ref 0.0–40.0)

## 2021-11-13 LAB — HEMOGLOBIN A1C: Hgb A1c MFr Bld: 5.3 % (ref 4.6–6.5)

## 2021-11-13 LAB — MICROALBUMIN / CREATININE URINE RATIO
Creatinine,U: 218 mg/dL
Microalb Creat Ratio: 0.7 mg/g (ref 0.0–30.0)
Microalb, Ur: 1.5 mg/dL (ref 0.0–1.9)

## 2021-11-13 LAB — VITAMIN B12: Vitamin B-12: 475 pg/mL (ref 211–911)

## 2021-11-16 ENCOUNTER — Ambulatory Visit
Admission: EM | Admit: 2021-11-16 | Discharge: 2021-11-16 | Disposition: A | Discharge status: Home / Self Care | Payer: BC Managed Care – PPO | Attending: Family Medicine | Admitting: Family Medicine

## 2021-11-16 ENCOUNTER — Ambulatory Visit (INDEPENDENT_AMBULATORY_CARE_PROVIDER_SITE_OTHER): Payer: BC Managed Care – PPO

## 2021-11-16 DIAGNOSIS — S99922A Unspecified injury of left foot, initial encounter: Secondary | ICD-10-CM | POA: Diagnosis not present

## 2021-11-16 DIAGNOSIS — S90112A Contusion of left great toe without damage to nail, initial encounter: Secondary | ICD-10-CM

## 2021-11-16 DIAGNOSIS — M7989 Other specified soft tissue disorders: Secondary | ICD-10-CM | POA: Diagnosis not present

## 2021-11-16 DIAGNOSIS — Z981 Arthrodesis status: Secondary | ICD-10-CM | POA: Diagnosis not present

## 2021-11-16 DIAGNOSIS — M79675 Pain in left toe(s): Secondary | ICD-10-CM

## 2021-11-16 MED ORDER — TRAMADOL HCL 50 MG PO TABS: 50.0000 mg | ORAL_TABLET | Freq: Three times a day (TID) | ORAL | 0 refills | Status: DC | PRN

## 2021-11-16 NOTE — ED Triage Notes (Signed)
Pt c/o injury to left big toe.   Pt was hiking on 11/13/21 and stepped into soft ground and her toe went backwards toward her leg.   Pt states that she has metal pins in her toe from a previous surgery in November.

## 2021-11-16 NOTE — ED Provider Notes (Signed)
[facility]    CSN: [id number] Arrival date & time: 11/16/21  1429      History   Chief Complaint Chief Complaint  Patient presents with   Toe Injury    HPI 46 year old female presents for evaluation the above.  Patient reports that she was hiking on 6/3 and stepped in a hole.  She subsequently injured her left great toe.  She has quite a lot of bruising and pain.  Decreased range of motion.  She has had previous surgery with hardware.  She is concerned about fracture.  No ankle pain.  No other associated symptoms.  No other complaints.  Past Medical History:  Diagnosis Date   Allergy    Allergy to animal dander    cats and dogs   Anxiety    Arthritis    Back   Complication of anesthesia    Pt does not want an epidural, have had reactions to that medication   Constipation    alternating from constipation to diarrhea   COVID-19 07/12/2020   Depression    Diabetes mellitus without complication ([place])    Type II   Gallstones 06/2016   by xray - surgery to remove   Generalized headaches    frequent   GERD (gastroesophageal reflux disease)    History of abnormal Pap smear    remote   History of gestational diabetes    first 2 pregnancies   History of kidney stones    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[place])    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    Hypertension    IBS (irritable bowel syndrome)    Morbid obesity ([place])    Periodontal disease 08/2011   currently getting dental work   Tobacco use     Patient Active Problem List   Diagnosis Date Noted   Insect bite 01/21/2021   Dysplasia of cervix, low grade (CIN 1) 09/26/2020   COVID-19 07/13/2020   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0 to 44.9 in adult [facility])    Ex-smoker    Controlled diabetes mellitus type 2 with complications [facility])     Past Surgical History:  Procedure Laterality Date   BIOPSY  07/04/2018   Procedure: BIOPSY;  Surgeon: Thornton Park, MD;  [facility];  Service: Gastroenterology;;   CESAREAN SECTION  [phone]   x2   CHOLECYSTECTOMY  2018   COLONOSCOPY WITH PROPOFOL N/A 07/04/2018   for chronic diarrhea after cholecystectomy started on colestipol with benefit ([place]). Thought she has IBS-D. 2 TA, rpt 5 yrs   CYSTOSCOPY N/A 08/20/2020   Procedure: CYSTOSCOPY;  Surgeon: Aletha Halim, MD;  Location: [place];  Service: Gynecology;  Laterality: N/A;   ENDOMETRIAL BIOPSY  01/25/2020       HYSTERECTOMY ABDOMINAL WITH SALPINGECTOMY Bilateral 08/20/2020   done for heavy bleed and pelvic pain - pathology showed CIN1 of cervix Ilda Basset, Eduard Clos, MD)   HYSTEROSCOPY WITH  D & C N/A 01/05/2018   Minerva for heavy bleeding, IUD removed - DILATATION AND CURETTAGE /HYSTEROSCOPY WITH MINERVA ABLATION; [name], MD   INTRAUTERINE DEVICE INSERTION     Mirena   POLYPECTOMY  07/04/2018   Procedure: POLYPECTOMY;  Surgeon: Thornton Park, MD;  [facility];  Service: Gastroenterology;;   TOTAL LAPAROSCOPIC HYSTERECTOMY WITH SALPINGECTOMY Bilateral 08/20/2020   Procedure: ATTEMPTED TOTAL LAPAROSCOPIC HYSTERECTOMY WITH SALPINGECTOMY;  Surgeon: Aletha Halim, MD;  Location: [place];  Service: Gynecology;  Laterality: Bilateral;   TUBAL LIGATION     WISDOM TOOTH EXTRACTION     x 4    OB History     Gravida  2   Para  2   Term  2   Preterm  0   AB   0   Living  2      SAB  0   IAB  0   Ectopic  0   Multiple  0   Live Births  2            Home Medications    Prior to Admission medications   Medication Sig Start Date End Date Taking? Authorizing Provider  Accu-Chek FastClix Lancets MISC 1 each by Does not apply route as directed. Use as directed to check blood sugar twice daily as needed. E11.8 10/03/19  Yes Ria Bush, MD  acetaminophen (TYLENOL) 500 MG tablet Take 2 tablets (1,000 mg total) by mouth every 8 (eight) hours as needed for mild pain, moderate pain, fever or headache. 03/03/21 03/03/22 Yes Alfred Levins, [place], MD  amLODipine (NORVASC) 5 MG tablet TAKE 1 TABLET BY MOUTH EVERY DAY 01/23/21  Yes Ria Bush, MD  BIKTARVY 50-200-25 MG TABS tablet Take 1 tablet by mouth at bedtime. 09/02/21  Yes Tsosie Billing, MD  citalopram (CELEXA) 20 MG tablet TAKE 1 TABLET BY MOUTH EVERY DAY 10/27/21  Yes Ria Bush, MD  colestipol (COLESTID) 1 g tablet TAKE 2 TABLETS (2 G TOTAL) BY MOUTH 2 (TWO) TIMES DAILY. Patient taking differently: Take 1 g by mouth daily. 05/23/19  Yes Thornton Park, MD  cyanocobalamin (,VITAMIN B-12,) 1000 MCG/ML injection Inject 1 mL (1,000 mcg total) into the muscle every 30 (thirty) days. 06/28/21  Yes Ria Bush, MD  cyclobenzaprine (FLEXERIL) 5 MG tablet Take 1 tablet (5 mg total) by mouth 2 (two) times daily as needed (headache (sedation precautions)). 08/26/21  Yes Ria Bush, MD  diphenhydrAMINE (BENADRYL) 25 MG tablet Take 25 mg by mouth at bedtime as needed for allergies or sleep.   Yes [provider]  glucose blood (ACCU-CHEK GUIDE) test strip 1 each by Other route as needed for other. Use as instructed to check blood sugar 01/17/19  Yes Ria Bush, MD  lisinopril (ZESTRIL) 10 MG tablet TAKE 1 TABLET BY MOUTH EVERY DAY 03/25/21  Yes Ria Bush, MD  loratadine (CLARITIN) 10 MG tablet Take 10 mg by mouth daily as needed for allergies.    Yes [provider]  OZEMPIC, 1 MG/DOSE, 4 MG/3ML SOPN INJECT 0.75 MLS (1 MG TOTAL) INTO THE SKIN ONCE A WEEK. 03/25/21  Yes Ria Bush, MD  pantoprazole (PROTONIX) 40 MG tablet TAKE 1 TABLET BY MOUTH TWICE A DAY 07/04/20  Yes Thornton Park, MD  Peppermint Oil (IBGARD PO) Take 2 tablets by mouth daily as needed (diarrhea).   Yes [provider]  traMADol (ULTRAM) 50 MG tablet Take 1 tablet (50 mg total) by mouth every 8 (eight) hours as needed for moderate pain or  severe pain. 11/16/21  Yes Coral Spikes, DO    Family History Family History  Problem Relation Age of Onset   Hypertension Mother    Hypertension Father    Appendicitis Father    Atrial fibrillation Father    Diabetes Maternal Grandmother        s/p amputation   Hyperlipidemia Maternal Grandmother    Hypertension Maternal Grandmother    Stroke Maternal Grandmother    Hyperlipidemia Maternal Grandfather    Hypertension Maternal Grandfather    Skin cancer Maternal Grandfather    Diabetes Maternal Grandfather    Seizures Paternal Grandmother 71       deceased   Coronary artery disease Paternal Grandfather 69       MI   Breast cancer Neg Hx    Colon cancer Neg Hx    Colon polyps Neg Hx    Esophageal cancer Neg Hx    Rectal cancer Neg Hx    Stomach cancer Neg Hx     Social History Social History   Tobacco Use   Smoking status: Some Days    Packs/day: 0.10    Years: 23.00    Total pack years: 2.30    Types: Cigarettes   Smokeless tobacco: Never   Tobacco comments:    1 every now and then  Vaping Use   Vaping Use: Never used  Substance Use Topics   Alcohol use: Yes    Alcohol/week: 0.0 standard drinks of alcohol    Comment: occasional   Drug use: No     Allergies   Metformin and related and Nickel   Review of Systems Review of Systems Per HPI  Physical Exam Triage Vital Signs ED Triage Vitals  Enc Vitals Group     BP 11/16/21 1449 (!) 130/93     Pulse Rate 11/16/21  1449 79     Resp 11/16/21 1449 18     Temp 11/16/21 1449 98.8 F (37.1 C)     Temp Source 11/16/21 1449 Oral     SpO2 11/16/21 1449 96 %     Weight 11/16/21 1447 270 lb (122.5 kg)     Height 11/16/21 1447 '5\' 8"'$  (1.727 m)     Head Circumference --      Peak Flow --      Pain Score 11/16/21 1446 6     Pain Loc --      Pain Edu? --      Excl. in [place]? --    Updated Vital Signs BP (!) 130/93 (BP Location: Left Arm)   Pulse 79   Temp 98.8 F (37.1 C) (Oral)   Resp 18   Ht '5\' 8"'$  (1.727 m)   Wt 122.5 kg   LMP 07/10/2020   SpO2 96%   BMI 41.05 kg/m   Visual Acuity Right Eye Distance:   Left Eye Distance:   Bilateral Distance:    Right Eye Near:   Left Eye Near:    Bilateral Near:     Physical Exam Vitals and nursing note reviewed.  Constitutional:      General: She is not in acute distress.    Appearance: Normal appearance. She is not ill-appearing.  HENT:     Head: Normocephalic and atraumatic.  Eyes:     General:        Right eye: No discharge.        Left eye: No discharge.     Conjunctiva/sclera: Conjunctivae normal.  Pulmonary:     Effort: Pulmonary  effort is normal. No respiratory distress.  Musculoskeletal:     Comments: Left great toe -decreased range of motion.  Mild edema.  Significant contusion noted.  Neurological:     Mental Status: She is alert.  Psychiatric:        Mood and Affect: Mood normal.        Behavior: Behavior normal.    UC Treatments / Results  Labs (all labs ordered are listed, but only abnormal results are displayed) Labs Reviewed - No data to display  EKG   Radiology DG Toe Great Left  Result Date: 11/16/2021 CLINICAL DATA:  Status post trauma 3 days ago. EXAM: LEFT GREAT TOE COMPARISON:  March 02, 2021 FINDINGS: There is no evidence of an acute fracture or dislocation. A large radiopaque fusion plate and screws are seen overlying the distal aspect of the first left metatarsal and proximal phalanx of the left great toe.  Underlying chronic osseous deformities are seen. Diffuse soft tissue swelling is noted. IMPRESSION: Chronic and postoperative changes without evidence of acute osseous abnormality. Electronically Signed   By: Virgina Norfolk M.D.   On: 11/16/2021 15:28    Procedures Procedures (including critical care time)  Medications Ordered in UC Medications - No data to display  Initial Impression / Assessment and Plan / UC Course  I have reviewed the triage vital signs and the nursing notes.  Pertinent labs & imaging results that were available during my care of the patient were reviewed by me and considered in my medical decision making (see chart for details).    46 year old female presents with an injury to the left great toe.  X-ray was obtained was independently reviewed by me.  Interpretation: Hardware intact.  No acute fracture.  Patient diagnosed with contusion.  Advise rest, ice, elevation.  Tramadol as needed for pain.  Supportive care.  Final Clinical Impressions(s) / UC Diagnoses   Final diagnoses:  Contusion of left great toe without damage to nail, initial encounter     Discharge Instructions      Rest, ice, elevation.  Medication as directed.  Take care  Dr. Lacinda Axon    ED Prescriptions     Medication Sig Dispense Auth. Provider   traMADol (ULTRAM) 50 MG tablet Take 1 tablet (50 mg total) by mouth every 8 (eight) hours as needed for moderate pain or severe pain. 10 tablet Thersa Salt G, DO      I have reviewed the PDMP during this encounter.   Coral Spikes, [place] 11/16/21 1614

## 2021-11-16 NOTE — Discharge Instructions (Addendum)
Rest, ice, elevation. ° °Medication as directed. ° °Take care ° °Dr. Avagail Whittlesey  °

## 2021-11-21 ENCOUNTER — Encounter: Payer: Self-pay | Admitting: Family Medicine

## 2021-11-21 ENCOUNTER — Ambulatory Visit (INDEPENDENT_AMBULATORY_CARE_PROVIDER_SITE_OTHER): Payer: BC Managed Care – PPO | Admitting: Family Medicine

## 2021-11-21 VITALS — BP 118/82 | HR 98 | Temp 97.7°F | Ht 68.0 in | Wt 274.0 lb

## 2021-11-21 DIAGNOSIS — Z0000 Encounter for general adult medical examination without abnormal findings: Secondary | ICD-10-CM | POA: Diagnosis not present

## 2021-11-21 DIAGNOSIS — E538 Deficiency of other specified B group vitamins: Secondary | ICD-10-CM

## 2021-11-21 DIAGNOSIS — K529 Noninfective gastroenteritis and colitis, unspecified: Secondary | ICD-10-CM

## 2021-11-21 DIAGNOSIS — Z6841 Body Mass Index (BMI) 40.0 and over, adult: Secondary | ICD-10-CM

## 2021-11-21 DIAGNOSIS — B20 Human immunodeficiency virus [HIV] disease: Secondary | ICD-10-CM

## 2021-11-21 DIAGNOSIS — F331 Major depressive disorder, recurrent, moderate: Secondary | ICD-10-CM | POA: Diagnosis not present

## 2021-11-21 DIAGNOSIS — M67432 Ganglion, left wrist: Secondary | ICD-10-CM

## 2021-11-21 DIAGNOSIS — N870 Mild cervical dysplasia: Secondary | ICD-10-CM

## 2021-11-21 DIAGNOSIS — E1169 Type 2 diabetes mellitus with other specified complication: Secondary | ICD-10-CM

## 2021-11-21 DIAGNOSIS — K219 Gastro-esophageal reflux disease without esophagitis: Secondary | ICD-10-CM

## 2021-11-21 DIAGNOSIS — I10 Essential (primary) hypertension: Secondary | ICD-10-CM

## 2021-11-21 MED ORDER — CYANOCOBALAMIN 1000 MCG/ML IJ SOLN
1000.0000 ug | Freq: Once | INTRAMUSCULAR | Status: AC
  Administered 2021-11-21: 1000 ug via INTRAMUSCULAR

## 2021-11-21 MED ORDER — AMLODIPINE BESYLATE 5 MG PO TABS: 5.0000 mg | ORAL_TABLET | Freq: Every day | ORAL | 3 refills | Status: DC

## 2021-11-21 MED ORDER — CITALOPRAM HYDROBROMIDE 20 MG PO TABS: 20.0000 mg | ORAL_TABLET | Freq: Every day | ORAL | 3 refills | Status: DC

## 2021-11-21 MED ORDER — OZEMPIC (1 MG/DOSE) 4 MG/3ML ~~LOC~~ SOPN: 1.0000 mg | PEN_INJECTOR | SUBCUTANEOUS | 3 refills | Status: DC

## 2021-11-21 MED ORDER — LISINOPRIL 10 MG PO TABS: 10.0000 mg | ORAL_TABLET | Freq: Every day | ORAL | 3 refills | Status: DC

## 2021-11-21 NOTE — Assessment & Plan Note (Signed)
Stable period on Biktarvy regularly followed by ID.

## 2021-11-21 NOTE — Assessment & Plan Note (Addendum)
Will call GYN to schedule appt.

## 2021-11-21 NOTE — Assessment & Plan Note (Signed)
Stable period on celexa - continue.

## 2021-11-21 NOTE — Assessment & Plan Note (Addendum)
Preventative protocols reviewed and updated unless pt declined. Discussed healthy diet and lifestyle.  She will call to schedule mammogram and well woman exam.

## 2021-11-21 NOTE — Assessment & Plan Note (Signed)
Chronic, great control on ozempic - continue this.  

## 2021-11-21 NOTE — Assessment & Plan Note (Signed)
Story/exam consistent with this.  Discussed conservative care with topical voltaren gel and wrist brace.  Will let us know if desires hand surgery referral.

## 2021-11-21 NOTE — Assessment & Plan Note (Signed)
Maintaining levels on daily oral B12 replacement. Will update B12 shot today then do PRN.  Anticipate stopping metformin and decreasing PPI use has helped with this.

## 2021-11-21 NOTE — Assessment & Plan Note (Signed)
Chronic, stable on current regimen - continue. Consider switch to lotrel for convenience.

## 2021-11-21 NOTE — Assessment & Plan Note (Addendum)
Post-cholecystectomy diarrhea managed with PRN colestipol. Finds ozempic has helped control looser stools.  Also anticipate component of IBS-D managed with IBGard.  Sees GI.

## 2021-11-21 NOTE — Patient Instructions (Addendum)
B12 shot today. Continue daily oral replacement.  Call GYN for appointment.  Call [facility] to schedule mammogram.  Return as needed or in 6 months for diabetes follow up visit I think you have ganglion cyst to left posterior wrist - use voltaren gel, continue wrist brace especially at night, let us know for referral to hand surgeon if not improving with this.   Health Maintenance, Female Adopting a healthy lifestyle and getting preventive care are important in promoting health and wellness. Ask your health care provider about: The right schedule for you to have regular tests and exams. Things you can do on your own to prevent diseases and keep yourself healthy. What should I know about diet, weight, and exercise? Eat a healthy diet  Eat a diet that includes plenty of vegetables, fruits, low-fat dairy products, and lean protein. Do not eat a lot of foods that are high in solid fats, added sugars, or sodium. Maintain a healthy weight Body mass index (BMI) is used to identify weight problems. It estimates body fat based on height and weight. Your health care provider can help determine your BMI and help you achieve or maintain a healthy weight. Get regular exercise Get regular exercise. This is one of the most important things you can do for your health. Most adults should: Exercise for at least 150 minutes each week. The exercise should increase your heart rate and make you sweat (moderate-intensity exercise). Do strengthening exercises at least twice a week. This is in addition to the moderate-intensity exercise. Spend less time sitting. Even light physical activity can be beneficial. Watch cholesterol and blood lipids Have your blood tested for lipids and cholesterol at 44 years of age, then have this test every 5 years. Have your cholesterol levels checked more often if: Your lipid or cholesterol levels are high. You are older than 44 years of age. You are at high risk for heart  disease. What should I know about cancer screening? Depending on your health history and family history, you may need to have cancer screening at various ages. This may include screening for: Breast cancer. Cervical cancer. Colorectal cancer. Skin cancer. Lung cancer. What should I know about heart disease, diabetes, and high blood pressure? Blood pressure and heart disease High blood pressure causes heart disease and increases the risk of stroke. This is more likely to develop in people who have high blood pressure readings or are overweight. Have your blood pressure checked: Every 3-5 years if you are 56-73 years of age. Every year if you are 24 years old or older. Diabetes Have regular diabetes screenings. This checks your fasting blood sugar level. Have the screening done: Once every three years after age 73 if you are at a normal weight and have a low risk for diabetes. More often and at a younger age if you are overweight or have a high risk for diabetes. What should I know about preventing infection? Hepatitis B If you have a higher risk for hepatitis B, you should be screened for this virus. Talk with your health care provider to find out if you are at risk for hepatitis B infection. Hepatitis C Testing is recommended for: Everyone born from [birth year] through 1963. Anyone with known risk factors for hepatitis C. Sexually transmitted infections (STIs) Get screened for STIs, including gonorrhea and chlamydia, if: You are sexually active and are younger than 43 years of age. You are older than 44 years of age and your health care provider tells you  that you are at risk for this type of infection. Your sexual activity has changed since you were last screened, and you are at increased risk for chlamydia or gonorrhea. Ask your health care provider if you are at risk. Ask your health care provider about whether you are at high risk for HIV. Your health care provider may recommend a  prescription medicine to help prevent HIV infection. If you choose to take medicine to prevent HIV, you should first get tested for HIV. You should then be tested every 3 months for as long as you are taking the medicine. Pregnancy If you are about to stop having your period (premenopausal) and you may become pregnant, seek counseling before you get pregnant. Take 400 to 800 micrograms (mcg) of folic acid every day if you become pregnant. Ask for birth control (contraception) if you want to prevent pregnancy. Osteoporosis and menopause Osteoporosis is a disease in which the bones lose minerals and strength with aging. This can result in bone fractures. If you are 10 years old or older, or if you are at risk for osteoporosis and fractures, ask your health care provider if you should: Be screened for bone loss. Take a calcium or vitamin D supplement to lower your risk of fractures. Be given hormone replacement therapy (HRT) to treat symptoms of menopause. Follow these instructions at home: Alcohol use Do not drink alcohol if: Your health care provider tells you not to drink. You are pregnant, may be pregnant, or are planning to become pregnant. If you drink alcohol: Limit how much you have to: 0-1 drink a day. Know how much alcohol is in your drink. In the [place] one drink equals one 12 oz bottle of beer (355 mL), one 5 oz glass of wine (148 mL), or one 1 oz glass of hard liquor (44 mL). Lifestyle Do not use any products that contain nicotine or tobacco. These products include cigarettes, chewing tobacco, and vaping devices, such as e-cigarettes. If you need help quitting, ask your health care provider. Do not use street drugs. Do not share needles. Ask your health care provider for help if you need support or information about quitting drugs. General instructions Schedule regular health, dental, and eye exams. Stay current with your vaccines. Tell your health care provider if: You often  feel depressed. You have ever been abused or do not feel safe at home. Summary Adopting a healthy lifestyle and getting preventive care are important in promoting health and wellness. Follow your health care provider's instructions about healthy diet, exercising, and getting tested or screened for diseases. Follow your health care provider's instructions on monitoring your cholesterol and blood pressure. This information is not intended to replace advice given to you by your health care provider. Make sure you discuss any questions you have with your health care provider. Document Revised: 10/14/2020 Document Reviewed: 10/14/2020 Elsevier Patient Education  [place].

## 2021-11-21 NOTE — Progress Notes (Signed)
Patient ID: Cassandra [name], female    DOB: March 05, 1976, 46 y.o.   MRN: [id number]  This visit was conducted in person.  BP 118/82   Pulse 98   Temp 97.7 F (36.5 C) (Temporal)   Ht '5\' 8"'$  (1.727 m)   Wt 274 lb (124.3 kg)   LMP 07/10/2020   SpO2 96%   BMI 41.66 kg/m    CC: CPE Subjective:   HPI: Cassandra [name] is a 46 y.o. female presenting on 11/21/2021 for Annual Exam (Needs summer camp form completed. )   Sees ID yearly for h/o HIV.   Notes some concentration difficulty.  3-4 wk h/o L posterior hand pain, seen at [facility] thought CTS related placed in wrist brace without significant improvement. Notes L dorsal wrist pain, worse with certain movements and pressure, notes numbness to dorsal 3rd MCPJ. Denies inciting trauma/injury or fall. Initial swelling, now better. No redness or warmth of joints.    B12 deficiency - levels improved on monthly injections + oral replacement (1027mg daily), last IM dose 07/30/2021. She is also off metformin, and not regularly taking protonix (previously BID) - GERD symptoms better with weight loss.    Preventative: COLONOSCOPY WITH PROPOFOL 07/04/2018 for chronic diarrhea after cholecystectomy started on colestipol with benefit ([place]). Thought she has IBS-D. 2 TA, rpt 5 yrs  Well woman with Dr. PIlda Basset- s/p hysterectomy 08/2019, pap obtained during hysterectomy CIN1 - rec yearly pap screening for 20 yrs - will call and schedule f/u.  Mammogram 11/2020 Bir[facility]1 @ [facility]. Will call and schedule rpt - wants to continue yearly.  Flu yearly COVID vaccine Moderna 05/2020, 06/2020, no booster Tdap 12/2012 Pneumovax23 2016  Seat belt use discussed   Sunscreen use discussed, no changing moles on skin.  Sleep - averaging 6-8 hours/night  Ex smoker - 2017, intermittent smoking hx Alcohol - rare  Dentist - due Eye exam - yearly  Lives with husband and 2 children, no pets Occupation: call center rep Edu: some college Activity: walking  regularly - recovering after recent foot surgery  Diet: 1 mt dew in am, rest water, fruits/vegetables daily     Relevant past medical, surgical, family and social history reviewed and updated as indicated. Interim medical history since our last visit reviewed. Allergies and medications reviewed and updated. Outpatient Medications Prior to Visit  Medication Sig Dispense Refill   Accu-Chek FastClix Lancets MISC 1 each by Does not apply route as directed. Use as directed to check blood sugar twice daily as needed. E11.8 102 each 3   acetaminophen (TYLENOL) 500 MG tablet Take 2 tablets (1,000 mg total) by mouth every 8 (eight) hours as needed for mild pain, moderate pain, fever or headache. 50 tablet 0   BIKTARVY 50-200-25 MG TABS tablet Take 1 tablet by mouth at bedtime. 90 tablet 4   colestipol (COLESTID) 1 g tablet TAKE 2 TABLETS (2 G TOTAL) BY MOUTH 2 (TWO) TIMES DAILY. (Patient taking differently: Take 1 g by mouth daily.) 360 tablet 0   cyanocobalamin (,VITAMIN B-12,) 1000 MCG/ML injection Inject 1 mL (1,000 mcg total) into the muscle every 30 (thirty) days.     cyclobenzaprine (FLEXERIL) 5 MG tablet Take 1 tablet (5 mg total) by mouth 2 (two) times daily as needed (headache (sedation precautions)). 30 tablet 3   diphenhydrAMINE (BENADRYL) 25 MG tablet Take 25 mg by mouth at bedtime as needed for allergies or sleep.     glucose blood (ACCU-CHEK GUIDE) test strip 1  each by Other route as needed for other. Use as instructed to check blood sugar 100 each 0   loratadine (CLARITIN) 10 MG tablet Take 10 mg by mouth daily as needed for allergies.     Peppermint Oil (IBGARD PO) Take 2 tablets by mouth daily as needed (diarrhea).     traMADol (ULTRAM) 50 MG tablet Take 1 tablet (50 mg total) by mouth every 8 (eight) hours as needed for moderate pain or severe pain. 10 tablet 0   amLODipine (NORVASC) 5 MG tablet TAKE 1 TABLET BY MOUTH EVERY DAY 90 tablet 3   citalopram (CELEXA) 20 MG tablet TAKE 1 TABLET  BY MOUTH EVERY DAY 90 tablet 0   lisinopril (ZESTRIL) 10 MG tablet TAKE 1 TABLET BY MOUTH EVERY DAY 90 tablet 2   OZEMPIC, 1 MG/DOSE, 4 MG/3ML SOPN INJECT 0.75 MLS (1 MG TOTAL) INTO THE SKIN ONCE A WEEK. 9 mL 3   pantoprazole (PROTONIX) 40 MG tablet TAKE 1 TABLET BY MOUTH TWICE A DAY 180 tablet 0   No facility-administered medications prior to visit.     Per HPI unless specifically indicated in ROS section below Review of Systems  Constitutional:  Negative for activity change, appetite change, chills, fatigue, fever and unexpected weight change.  HENT:  Negative for hearing loss.   Eyes:  Negative for visual disturbance.  Respiratory:  Negative for cough, chest tightness, shortness of breath and wheezing.   Cardiovascular:  Negative for chest pain, palpitations and leg swelling.  Gastrointestinal:  Positive for abdominal pain (occ lower abd discomfort), blood in stool (yesteday - attributes to constipation), constipation (occ) and diarrhea (occ). Negative for abdominal distention, nausea and vomiting.  Genitourinary:  Negative for difficulty urinating and hematuria.  Musculoskeletal:  Negative for arthralgias, myalgias and neck pain.  Skin:  Negative for rash.  Neurological:  Positive for headaches (occ). Negative for dizziness, seizures and syncope.  Hematological:  Negative for adenopathy. Does not bruise/bleed easily.  Psychiatric/Behavioral:  Negative for dysphoric mood. The patient is not nervous/anxious.     Objective:  BP 118/82   Pulse 98   Temp 97.7 F (36.5 C) (Temporal)   Ht '5\' 8"'$  (1.727 m)   Wt 274 lb (124.3 kg)   LMP 07/10/2020   SpO2 96%   BMI 41.66 kg/m   Wt Readings from Last 3 Encounters:  11/21/21 274 lb (124.3 kg)  11/16/21 270 lb (122.5 kg)  09/02/21 271 lb (122.9 kg)      Physical Exam Vitals and nursing note reviewed.  Constitutional:      Appearance: Normal appearance. She is not ill-appearing.  HENT:     Head: Normocephalic and atraumatic.      Right Ear: Tympanic membrane, ear canal and external ear normal. There is no impacted cerumen.     Left Ear: Tympanic membrane, ear canal and external ear normal. There is no impacted cerumen.  Eyes:     General:        Right eye: No discharge.        Left eye: No discharge.     Extraocular Movements: Extraocular movements intact.     Conjunctiva/sclera: Conjunctivae normal.     Pupils: Pupils are equal, round, and reactive to light.  Neck:     Thyroid: No thyroid mass or thyromegaly.  Cardiovascular:     Rate and Rhythm: Normal rate and regular rhythm.     Pulses: Normal pulses.     Heart sounds: Normal heart sounds. No murmur heard. Pulmonary:  Effort: Pulmonary effort is normal. No respiratory distress.     Breath sounds: Normal breath sounds. No wheezing, rhonchi or rales.  Abdominal:     General: Bowel sounds are normal. There is no distension.     Palpations: Abdomen is soft. There is no mass.     Tenderness: There is no abdominal tenderness. There is no guarding or rebound.     Hernia: No hernia is present.  Musculoskeletal:        General: Swelling and tenderness present.     Cervical back: Normal range of motion and neck supple. No rigidity.     Right lower leg: No edema.     Left lower leg: No edema.     Comments:  1+ rad pulses bilaterally Painful swelling to dorsal L wrist with evident lump with wrist flexion  Lymphadenopathy:     Cervical: No cervical adenopathy.  Skin:    General: Skin is warm and dry.     Findings: No rash.  Neurological:     General: No focal deficit present.     Mental Status: She is alert. Mental status is at baseline.  Psychiatric:        Mood and Affect: Mood normal.        Behavior: Behavior normal.       Results for orders placed or performed in visit on 62/95/28  Basic metabolic panel  Result Value Ref Range   Sodium 140 135 - 145 mEq/L   Potassium 4.1 3.5 - 5.1 mEq/L   Chloride 105 96 - 112 mEq/L   CO2 28 19 - 32 mEq/L    Glucose, Bld 82 70 - 99 mg/dL   BUN 10 6 - 23 mg/dL   Creatinine, Ser 0.90 0.40 - 1.20 mg/dL   GFR 77.85 >60.00 mL/min   Calcium 8.5 8.4 - 10.5 mg/dL  Microalbumin / creatinine urine ratio  Result Value Ref Range   Microalb, Ur 1.5 0.0 - 1.9 mg/dL   Creatinine,U 218.0 mg/dL   Microalb Creat Ratio 0.7 0.0 - 30.0 mg/g  Hemoglobin A1c  Result Value Ref Range   Hgb A1c MFr Bld 5.3 4.6 - 6.5 %  Lipid panel  Result Value Ref Range   Cholesterol 165 0 - 200 mg/dL   Triglycerides 118.0 0.0 - 149.0 mg/dL   HDL 44.30 >39.00 mg/dL   VLDL 23.6 0.0 - 40.0 mg/dL   LDL Cholesterol 97 0 - 99 mg/dL   Total CHOL/HDL Ratio 4    NonHDL 120.34   Vitamin B12  Result Value Ref Range   Vitamin B-12 475 211 - 911 pg/mL    Assessment & Plan:   Problem List Items Addressed This Visit     Health maintenance examination - Primary (Chronic)    Preventative protocols reviewed and updated unless pt declined. Discussed healthy diet and lifestyle.  She will call to schedule mammogram and well woman exam.       Type 2 diabetes mellitus with other specified complication ([place])    Chronic, great control on ozempic - continue this.       Relevant Medications   lisinopril (ZESTRIL) 10 MG tablet   Semaglutide, 1 MG/DOSE, (OZEMPIC, 1 MG/DOSE,) 4 MG/3ML SOPN   MDD (major depressive disorder), recurrent episode, moderate (HCC)    Stable period on celexa - continue.       Relevant Medications   citalopram (CELEXA) 20 MG tablet   Body mass index (BMI) of 40.0 to 44.9 in adult [facility])  Continue to encourage healthy diet and lifestyle choices to affect sustainable weight loss. ozempic has been beneficial for weight loss.       Relevant Medications   Semaglutide, 1 MG/DOSE, (OZEMPIC, 1 MG/DOSE,) 4 MG/3ML SOPN   HIV (human immunodeficiency virus infection) ([place])    Stable period on Biktarvy regularly followed by ID.       Hypertension    Chronic, stable on current regimen - continue. Consider switch to  lotrel for convenience.       Relevant Medications   amLODipine (NORVASC) 5 MG tablet   lisinopril (ZESTRIL) 10 MG tablet   Chronic diarrhea    Post-cholecystectomy diarrhea managed with PRN colestipol. Finds ozempic has helped control looser stools.  Also anticipate component of IBS-D managed with IBGard.  Sees GI.       GERD (gastroesophageal reflux disease)    Weight loss has helped control reflux - now only using PPI PRN.      Vitamin B12 deficiency    Maintaining levels on daily oral B12 replacement. Will update B12 shot today then do PRN.  Anticipate stopping metformin and decreasing PPI use has helped with this.       Relevant Medications   cyanocobalamin ((VITAMIN B-12)) injection 1,000 mcg (Start on 11/21/2021  9:15 AM)   Dysplasia of cervix, low grade (CIN 1)    Will call GYN to schedule appt.       Ganglion cyst of dorsum of left wrist    Story/exam consistent with this.  Discussed conservative care with topical voltaren gel and wrist brace.  Will let us know if desires hand surgery referral.         Meds ordered this encounter  Medications   amLODipine (NORVASC) 5 MG tablet    Sig: Take 1 tablet (5 mg total) by mouth daily.    Dispense:  90 tablet    Refill:  3   citalopram (CELEXA) 20 MG tablet    Sig: Take 1 tablet (20 mg total) by mouth daily.    Dispense:  90 tablet    Refill:  3   lisinopril (ZESTRIL) 10 MG tablet    Sig: Take 1 tablet (10 mg total) by mouth daily.    Dispense:  90 tablet    Refill:  3   Semaglutide, 1 MG/DOSE, (OZEMPIC, 1 MG/DOSE,) 4 MG/3ML SOPN    Sig: Inject 1 mg as directed once a week.    Dispense:  9 mL    Refill:  3   cyanocobalamin ((VITAMIN B-12)) injection 1,000 mcg   No orders of the defined types were placed in this encounter.    Patient instructions: B12 shot today. Continue daily oral replacement.  Call GYN for appointment.  Call [facility] to schedule mammogram.  Return as needed or in 6 months for  diabetes follow up visit I think you have ganglion cyst to left posterior wrist - use voltaren gel, continue wrist brace especially at night, let us know for referral to hand surgeon if not improving with this.   Follow up plan: Return in about 6 months (around 05/23/2022) for follow up visit.  Ria Bush, MD

## 2021-11-21 NOTE — Assessment & Plan Note (Signed)
Continue to encourage healthy diet and lifestyle choices to affect sustainable weight loss. ozempic has been beneficial for weight loss.

## 2021-11-21 NOTE — Assessment & Plan Note (Signed)
Weight loss has helped control reflux - now only using PPI PRN.

## 2021-12-27 ENCOUNTER — Encounter: Payer: Self-pay | Admitting: Emergency Medicine

## 2021-12-27 ENCOUNTER — Ambulatory Visit
Admission: EM | Admit: 2021-12-27 | Discharge: 2021-12-27 | Disposition: A | Discharge status: Home / Self Care | Payer: BC Managed Care – PPO | Attending: Family Medicine | Admitting: Family Medicine

## 2021-12-27 ENCOUNTER — Ambulatory Visit (INDEPENDENT_AMBULATORY_CARE_PROVIDER_SITE_OTHER): Payer: BC Managed Care – PPO

## 2021-12-27 DIAGNOSIS — M7989 Other specified soft tissue disorders: Secondary | ICD-10-CM | POA: Diagnosis not present

## 2021-12-27 DIAGNOSIS — S97112A Crushing injury of left great toe, initial encounter: Secondary | ICD-10-CM

## 2021-12-27 DIAGNOSIS — S99922A Unspecified injury of left foot, initial encounter: Secondary | ICD-10-CM | POA: Diagnosis not present

## 2021-12-27 NOTE — Discharge Instructions (Addendum)
Rest and elevate the affected painful area.   Apply cold compresses intermittently as needed.  As pain recedes, begin normal activities slowly as tolerated.  Recommend follow-up with [facility] if pain does not subside within the next 3 to 5 days or sooner if pain worsens.

## 2021-12-27 NOTE — ED Triage Notes (Signed)
Patient c/o LFT greater toe injury that happened 2 days ago.   Patient endorses onset of symptoms began after "stepping on stairs and extending toe".  Patient endorses swelling and bruise to site.   Patient endorses having previous history of surgery on affected area.   Patient endorses increased pain with ambulation.   Patient has taken tylenol and ibuprofen with some relief of symptoms.

## 2021-12-27 NOTE — ED Provider Notes (Signed)
Cassandra [name]    CSN: [id number] Arrival date & time: 12/27/21  1405      History   Chief Complaint Chief Complaint  Patient presents with   Toe Injury    HPI Cassandra [name] is a 46 y.o. female.   HPI Patient here for evaluation of left great toe pain following injury 2 days ago in which she hyperextended the great toe while stopping her stairs. She endorses swelling and bruising. Seen 6/11 for prior injury of same toe and is s/p surgery for repair of fracture of great toe.  She has been taking ibuprofen and Tylenol for management of pain.  Past Medical History:  Diagnosis Date   Allergy    Allergy to animal dander    cats and dogs   Anxiety    Arthritis    Back   Complication of anesthesia    Pt does not want an epidural, have had reactions to that medication   Constipation    alternating from constipation to diarrhea   COVID-19 07/12/2020   Depression    Diabetes mellitus without complication ([place])    Type II   Gallstones 06/2016   by xray - surgery to remove   Generalized headaches    frequent   GERD (gastroesophageal reflux disease)    History of abnormal Pap smear    remote   History of gestational diabetes    first 2 pregnancies   History of kidney stones    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[place])    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    Hypertension    IBS (irritable bowel syndrome)    Morbid obesity ([place])    Periodontal disease 08/2011   currently getting dental work   Tobacco use     Patient Active Problem List   Diagnosis Date Noted   Ganglion cyst of dorsum of left wrist 11/21/2021   Dysplasia of cervix, low grade (CIN 1) 09/26/2020   COVID-19 07/13/2020   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   Migraine 06/30/2019   Vitamin B12 deficiency 12/31/2018   GERD (gastroesophageal reflux disease) 12/27/2018   Polyp of descending colon    Chronic diarrhea 04/04/2018   Closed compression fracture of L1 lumbar vertebra  07/05/2016   Lumbar pain with radiation down both legs 12/20/2015   Health maintenance examination 05/25/2014   Ulnar neuropathy 06/28/2013   Hypertension 09/14/2012   HIV (human immunodeficiency virus infection) ([place]) 01/04/2012   Varicose vein 01/01/2012   MDD (major depressive disorder), recurrent episode, moderate ([place]) 09/02/2011   Body mass index (BMI) of 40.0 to 44.9 in adult [facility])    Ex-smoker    Type 2 diabetes mellitus with other specified complication [facility])     Past Surgical History:  Procedure Laterality Date   BIOPSY  07/04/2018   Procedure: BIOPSY;  Surgeon: Thornton Park, MD;  Location: [facility];  Service: Gastroenterology;;   CESAREAN SECTION  [phone]   x2   CHOLECYSTECTOMY  2018   COLONOSCOPY WITH PROPOFOL N/A 07/04/2018   for chronic diarrhea after cholecystectomy started on colestipol with benefit ([place]). Thought she has IBS-D. 2 TA, rpt 5 yrs   CYSTOSCOPY N/A 08/20/2020   Procedure: CYSTOSCOPY;  Surgeon: Aletha Halim, MD;  Location: [facility];  Service: Gynecology;  Laterality: N/A;   ENDOMETRIAL BIOPSY  01/25/2020       HYSTERECTOMY ABDOMINAL WITH SALPINGECTOMY Bilateral 08/20/2020   done for heavy bleed and pelvic pain - pathology showed CIN1 of cervix (  Aletha Halim, MD)   HYSTEROSCOPY WITH D & C N/A 01/05/2018   [place] for heavy bleeding, IUD removed - DILATATION AND CURETTAGE /HYSTEROSCOPY WITH MINERVA ABLATION; [name], MD   INTRAUTERINE DEVICE INSERTION     Mirena   POLYPECTOMY  07/04/2018   Procedure: POLYPECTOMY;  Surgeon: Thornton Park, MD;  Location: [facility];  Service: Gastroenterology;;   TOE SURGERY Left 2022   TOTAL LAPAROSCOPIC HYSTERECTOMY WITH SALPINGECTOMY Bilateral 08/20/2020   Procedure: ATTEMPTED TOTAL LAPAROSCOPIC HYSTERECTOMY WITH SALPINGECTOMY;  Surgeon: Aletha Halim, MD;  Location: [place];  Service: Gynecology;  Laterality: Bilateral;   TUBAL LIGATION     WISDOM TOOTH EXTRACTION     x 4    OB  History     Gravida  2   Para  2   Term  2   Preterm  0   AB  0   Living  2      SAB  0   IAB  0   Ectopic  0   Multiple  0   Live Births  2            Home Medications    Prior to Admission medications   Medication Sig Start Date End Date Taking? Authorizing Provider  amLODipine (NORVASC) 5 MG tablet Take 1 tablet (5 mg total) by mouth daily. 11/21/21  Yes Ria Bush, MD  BIKTARVY 50-200-25 MG TABS tablet Take 1 tablet by mouth at bedtime. 09/02/21  Yes Tsosie Billing, MD  citalopram (CELEXA) 20 MG tablet Take 1 tablet (20 mg total) by mouth daily. 11/21/21  Yes Ria Bush, MD  lisinopril (ZESTRIL) 10 MG tablet Take 1 tablet (10 mg total) by mouth daily. 11/21/21  Yes Ria Bush, MD  Semaglutide, 1 MG/DOSE, (OZEMPIC, 1 MG/DOSE,) 4 MG/3ML SOPN Inject 1 mg as directed once a week. 11/21/21  Yes Ria Bush, MD  Accu-Chek FastClix Lancets MISC 1 each by Does not apply route as directed. Use as directed to check blood sugar twice daily as needed. E11.8 10/03/19   Ria Bush, MD  acetaminophen (TYLENOL) 500 MG tablet Take 2 tablets (1,000 mg total) by mouth every 8 (eight) hours as needed for mild pain, moderate pain, fever or headache. 03/03/21 03/03/22  Alfred Levins, [place], MD  colestipol (COLESTID) 1 g tablet TAKE 2 TABLETS (2 G TOTAL) BY MOUTH 2 (TWO) TIMES DAILY. Patient taking differently: Take 1 g by mouth daily. 05/23/19   Thornton Park, MD  cyanocobalamin (,VITAMIN B-12,) 1000 MCG/ML injection Inject 1 mL (1,000 mcg total) into the muscle every 30 (thirty) days. 06/28/21   Ria Bush, MD  cyclobenzaprine (FLEXERIL) 5 MG tablet Take 1 tablet (5 mg total) by mouth 2 (two) times daily as needed (headache (sedation precautions)). 08/26/21   Ria Bush, MD  diphenhydrAMINE (BENADRYL) 25 MG tablet Take 25 mg by mouth at bedtime as needed for allergies or sleep.    [provider]  glucose blood (ACCU-CHEK GUIDE)  test strip 1 each by Other route as needed for other. Use as instructed to check blood sugar 01/17/19   Ria Bush, MD  loratadine (CLARITIN) 10 MG tablet Take 10 mg by mouth daily as needed for allergies.    [provider]  meloxicam (MOBIC) 15 MG tablet Take 15 mg by mouth daily. 12/19/21   [provider]  Peppermint Oil (IBGARD PO) Take 2 tablets by mouth daily as needed (diarrhea).    [provider]  traMADol (ULTRAM) 50 MG tablet Take 1  tablet (50 mg total) by mouth every 8 (eight) hours as needed for moderate pain or severe pain. 11/16/21   Coral Spikes, DO    Family History Family History  Problem Relation Age of Onset   Hypertension Mother    Hypertension Father    Appendicitis Father    Atrial fibrillation Father    Diabetes Maternal Grandmother        s/p amputation   Hyperlipidemia Maternal Grandmother    Hypertension Maternal Grandmother    Stroke Maternal Grandmother    Hyperlipidemia Maternal Grandfather    Hypertension Maternal Grandfather    Skin cancer Maternal Grandfather    Diabetes Maternal Grandfather    Seizures Paternal Grandmother 91       deceased   Coronary artery disease Paternal Grandfather 31       MI   Breast cancer Neg Hx    Colon cancer Neg Hx    Colon polyps Neg Hx    Esophageal cancer Neg Hx    Rectal cancer Neg Hx    Stomach cancer Neg Hx     Social History Social History   Tobacco Use   Smoking status: Some Days    Packs/day: 0.10    Years: 23.00    Total pack years: 2.30    Types: Cigarettes   Smokeless tobacco: Never   Tobacco comments:    1 every now and then  Vaping Use   Vaping Use: Never used  Substance Use Topics   Alcohol use: Yes    Alcohol/week: 0.0 standard drinks of alcohol    Comment: occasional   Drug use: No     Allergies   Metformin and related and Nickel   Review of Systems Review of Systems   Physical Exam Triage Vital Signs ED Triage Vitals  Enc Vitals Group      BP 12/27/21 1506 137/88     Pulse Rate 12/27/21 1506 84     Resp 12/27/21 1506 16     Temp 12/27/21 1506 98.9 F (37.2 C)     Temp Source 12/27/21 1506 Oral     SpO2 12/27/21 1506 94 %     Weight --      Height --      Head Circumference --      Peak Flow --      Pain Score 12/27/21 1510 8     Pain Loc --      Pain Edu? --      Excl. in [place]? --    No data found.  Updated Vital Signs BP 137/88 (BP Location: Left Arm)   Pulse 84   Temp 98.9 F (37.2 C) (Oral)   Resp 16   LMP 07/10/2020   SpO2 94%   Visual Acuity Right Eye Distance:   Left Eye Distance:   Bilateral Distance:    Right Eye Near:   Left Eye Near:    Bilateral Near:     Physical Exam   UC Treatments / Results  Labs (all labs ordered are listed, but only abnormal results are displayed) Labs Reviewed - No data to display  EKG   Radiology DG Foot Complete Left  Result Date: 12/27/2021 CLINICAL DATA:  Left great toe surgery greater than 6 months ago. [place] Olivia Mackie ago. Concern for acute fracture. Hyperexpanded great toe walking up stairs. EXAM: LEFT FOOT - COMPLETE 3+ VIEW COMPARISON:  Left great toe radiographs 11/16/2021 FINDINGS: Redemonstration of dorsal plate and screw fusion of the great toe metatarsophalangeal  joint. No significant change in two bone densities just medial to the distal great toe metatarsal on frontal view. Mild-to-moderate first ray soft tissue swelling is similar to prior. No definite acute fracture is seen. Mild to moderate medial greater than lateral great toe interphalangeal joint space narrowing. Small plantar calcaneal heel spur. IMPRESSION: 1. No acute fracture is seen. 2. Redemonstration of dorsal instrumented great toe metatarsophalangeal arthrodesis. Electronically Signed   By: Yvonne Kendall M.D.   On: 12/27/2021 14:55    Procedures Procedures (including critical care time)  Medications Ordered in UC Medications - No data to display  Initial Impression / Assessment and  Plan / UC Course  I have reviewed the triage vital signs and the nursing notes.  Pertinent labs & imaging results that were available during my care of the patient were reviewed by me and considered in my medical decision making (see chart for details).     *** Final Clinical Impressions(s) / UC Diagnoses   Final diagnoses:  Crushing injury of left great toe, initial encounter   Discharge Instructions   None    ED Prescriptions   None    PDMP not reviewed this encounter.

## 2022-02-05 DIAGNOSIS — Z1239 Encounter for other screening for malignant neoplasm of breast: Secondary | ICD-10-CM | POA: Diagnosis not present

## 2022-02-05 DIAGNOSIS — Z1231 Encounter for screening mammogram for malignant neoplasm of breast: Secondary | ICD-10-CM | POA: Diagnosis not present

## 2022-02-05 LAB — HM MAMMOGRAPHY

## 2022-02-18 ENCOUNTER — Encounter: Payer: Self-pay | Admitting: Family Medicine

## 2022-03-11 LAB — HM DIABETES EYE EXAM

## 2022-03-17 ENCOUNTER — Telehealth: Payer: Self-pay

## 2022-03-17 NOTE — Telephone Encounter (Signed)
I received a prior auth for Ozempic (1 MG/DOSE) '4MG'$ /3ML pen-injectors. Cassandra [name] (Key: [id number]) Per Cover My Meds:  This medication may be excluded from the patient's benefit. For more information, please reach out to [employer] directly at [phone].  I called [employer] to see if they would approve the PA.  I spoke to [place], who stated that this medication has already been approved for patient from 06/27/2021-06/27/2022.  Case # [id number].  No new PA needed.

## 2022-04-12 ENCOUNTER — Encounter: Payer: Self-pay | Admitting: Family Medicine

## 2022-05-25 ENCOUNTER — Encounter: Payer: Self-pay | Admitting: Family Medicine

## 2022-05-25 ENCOUNTER — Ambulatory Visit (INDEPENDENT_AMBULATORY_CARE_PROVIDER_SITE_OTHER): Payer: BC Managed Care – PPO | Admitting: Family Medicine

## 2022-05-25 VITALS — BP 126/80 | HR 88 | Temp 97.5°F | Ht 68.0 in | Wt 283.8 lb

## 2022-05-25 DIAGNOSIS — K529 Noninfective gastroenteritis and colitis, unspecified: Secondary | ICD-10-CM

## 2022-05-25 DIAGNOSIS — Z6841 Body Mass Index (BMI) 40.0 and over, adult: Secondary | ICD-10-CM | POA: Diagnosis not present

## 2022-05-25 DIAGNOSIS — E1169 Type 2 diabetes mellitus with other specified complication: Secondary | ICD-10-CM

## 2022-05-25 DIAGNOSIS — I872 Venous insufficiency (chronic) (peripheral): Secondary | ICD-10-CM

## 2022-05-25 DIAGNOSIS — Z23 Encounter for immunization: Secondary | ICD-10-CM | POA: Diagnosis not present

## 2022-05-25 LAB — POCT GLYCOSYLATED HEMOGLOBIN (HGB A1C): Hemoglobin A1C: 5.1 % (ref 4.0–5.6)

## 2022-05-25 MED ORDER — MULTIVITAMIN ADULT PO TABS: 1.0000 | ORAL_TABLET | Freq: Every day | ORAL | Status: DC

## 2022-05-25 NOTE — Assessment & Plan Note (Signed)
Chronic, great control on ozempic - continue this.

## 2022-05-25 NOTE — Assessment & Plan Note (Signed)
Chronic, weight gain noted. Encouraged renewed efforts at diet and regular aerobic exercise - she is planning hike 06/2022.  Obesity complicated by comorbidities of hypertension, diabetes, history of lower back pain.

## 2022-05-25 NOTE — Assessment & Plan Note (Addendum)
Stable period off metformin and on colestipol for post-cholecystectomy diarrhea

## 2022-05-25 NOTE — Progress Notes (Signed)
Patient ID: Cassandra [name], female    DOB: [birth year], 46 y.o.   MRN: [id number]  This visit was conducted in person.  BP 126/80   Pulse 88   Temp (!) 97.5 F (36.4 C) (Temporal)   Ht '5\' 8"'$  (1.727 m)   Wt 283 lb 12.8 oz (128.7 kg)   LMP 07/10/2020   SpO2 97%   BMI 43.15 kg/m    CC: 6 mo DM f/u visit  Subjective:   HPI: Cassandra [name] is a 46 y.o. female presenting on 05/25/2022 for Diabetes (Here for 6 mo f/u.)   Going back to [employer] in [employer] online program to get degree in business administration.   DM - does regularly check sugars every morning fasting - 80-90s. Compliant with antihyperglycemic regimen which includes: ozempic '1mg'$  weekly. No nausea or constipation or epigastric pain. Metformin even low dose caused diarrhea. Denies low sugars or hypoglycemic symptoms. Denies paresthesias, blurry vision. Last diabetic eye exam 01/2022 [facility] doctor. Glucometer brand: accuchek. Last foot exam: 05/2021. DSME: completed 09/2019. More  trouble with diet during holiday season. Planning upcoming hike in [facility] 06/2022.  Lab Results  Component Value Date   HGBA1C 5.1 05/25/2022   Diabetic Foot Exam - Simple   Simple Foot Form Diabetic Foot exam was performed with the following findings: Yes 05/25/2022  9:38 AM  Visual Inspection No deformities, no ulcerations, no other skin breakdown bilaterally: Yes Sensation Testing Intact to touch and monofilament testing bilaterally: Yes Pulse Check Posterior Tibialis and Dorsalis pulse intact bilaterally: Yes Comments    Lab Results  Component Value Date   MICROALBUR 1.5 11/13/2021    B12 deficiency - improved off metformin and PPI. Off b12 shots, now just on MVI daily.      Relevant past medical, surgical, family and social history reviewed and updated as indicated. Interim medical history since our last visit reviewed. Allergies and medications reviewed and updated. Outpatient Medications Prior to  Visit  Medication Sig Dispense Refill   Accu-Chek FastClix Lancets MISC 1 each by Does not apply route as directed. Use as directed to check blood sugar twice daily as needed. E11.8 102 each 3   amLODipine (NORVASC) 5 MG tablet Take 1 tablet (5 mg total) by mouth daily. 90 tablet 3   BIKTARVY 50-200-25 MG TABS tablet Take 1 tablet by mouth at bedtime. 90 tablet 4   citalopram (CELEXA) 20 MG tablet Take 1 tablet (20 mg total) by mouth daily. 90 tablet 3   colestipol (COLESTID) 1 g tablet TAKE 2 TABLETS (2 G TOTAL) BY MOUTH 2 (TWO) TIMES DAILY. (Patient taking differently: Take 1 g by mouth daily.) 360 tablet 0   cyclobenzaprine (FLEXERIL) 5 MG tablet Take 1 tablet (5 mg total) by mouth 2 (two) times daily as needed (headache (sedation precautions)). 30 tablet 3   diphenhydrAMINE (BENADRYL) 25 MG tablet Take 25 mg by mouth at bedtime as needed for allergies or sleep.     glucose blood (ACCU-CHEK GUIDE) test strip 1 each by Other route as needed for other. Use as instructed to check blood sugar 100 each 0   lisinopril (ZESTRIL) 10 MG tablet Take 1 tablet (10 mg total) by mouth daily. 90 tablet 3   loratadine (CLARITIN) 10 MG tablet Take 10 mg by mouth daily as needed for allergies.     meloxicam (MOBIC) 15 MG tablet Take 15 mg by mouth daily.     Peppermint Oil (IBGARD PO) Take 2  tablets by mouth daily as needed (diarrhea).     Semaglutide, 1 MG/DOSE, (OZEMPIC, 1 MG/DOSE,) 4 MG/3ML SOPN Inject 1 mg as directed once a week. 9 mL 3   cyanocobalamin (,VITAMIN B-12,) 1000 MCG/ML injection Inject 1 mL (1,000 mcg total) into the muscle every 30 (thirty) days.     traMADol (ULTRAM) 50 MG tablet Take 1 tablet (50 mg total) by mouth every 8 (eight) hours as needed for moderate pain or severe pain. 10 tablet 0   No facility-administered medications prior to visit.     Per HPI unless specifically indicated in ROS section below Review of Systems  Objective:  BP 126/80   Pulse 88   Temp (!) 97.5 F (36.4  C) (Temporal)   Ht '5\' 8"'$  (1.727 m)   Wt 283 lb 12.8 oz (128.7 kg)   LMP 07/10/2020   SpO2 97%   BMI 43.15 kg/m   Wt Readings from Last 3 Encounters:  05/25/22 283 lb 12.8 oz (128.7 kg)  11/21/21 274 lb (124.3 kg)  11/16/21 270 lb (122.5 kg)      Physical Exam Vitals and nursing note reviewed.  Constitutional:      Appearance: Normal appearance. She is not ill-appearing.  Eyes:     Extraocular Movements: Extraocular movements intact.     Conjunctiva/sclera: Conjunctivae normal.     Pupils: Pupils are equal, round, and reactive to light.  Cardiovascular:     Rate and Rhythm: Normal rate and regular rhythm.     Pulses: Normal pulses.     Heart sounds: Normal heart sounds. No murmur heard. Pulmonary:     Effort: Pulmonary effort is normal. No respiratory distress.     Breath sounds: Normal breath sounds. No wheezing, rhonchi or rales.  Musculoskeletal:     Right lower leg: No edema.     Left lower leg: No edema.     Comments: Small varicose veins bilateral lower legs   Skin:    General: Skin is warm and dry.     Findings: No rash.  Neurological:     Mental Status: She is alert.  Psychiatric:        Mood and Affect: Mood normal.        Behavior: Behavior normal.       Results for orders placed or performed in visit on 05/25/22  POCT glycosylated hemoglobin (Hb A1C)  Result Value Ref Range   Hemoglobin A1C 5.1 4.0 - 5.6 %   HbA1c POC (<> result, manual entry)     HbA1c, POC (prediabetic range)     HbA1c, POC (controlled diabetic range)     Lab Results  Component Value Date   VITAMINB12 475 11/13/2021    Assessment & Plan:   Problem List Items Addressed This Visit     Type 2 diabetes mellitus with other specified complication ([place]) - Primary    Chronic, great control on ozempic - continue this.       Relevant Orders   POCT glycosylated hemoglobin (Hb A1C) (Completed)   Body mass index (BMI) of 40.0 to 44.9 in adult [facility])    Chronic, weight gain noted.  Encouraged renewed efforts at diet and regular aerobic exercise - she is planning hike 06/2022.  Obesity complicated by comorbidities of hypertension, diabetes, history of lower back pain.       Chronic venous insufficiency of lower extremity    Discussed with patient. Encouraged try TED hoses OTC, to let me know if desires prescription grade compression stockings  Chronic diarrhea    Stable period off metformin and on colestipol for post-cholecystectomy diarrhea      Other Visit Diagnoses     Need for influenza vaccination       Relevant Orders   Flu Vaccine QUAD 49moIM (Fluarix, Fluzone & Alfiuria Quad PF) (Completed)        Meds ordered this encounter  Medications   Multiple Vitamin (MULTIVITAMIN ADULT) TABS    Sig: Take 1 tablet by mouth daily.   Orders Placed This Encounter  Procedures   Flu Vaccine QUAD 626moM (Fluarix, Fluzone & Alfiuria Quad PF)   POCT glycosylated hemoglobin (Hb A1C)     Patient Instructions  Flu shot today  You are doing well today - continue current medicines.  Try ted hose for venous insufficiency of legs, let me know if interested in higher strength prescription compression stockings.  Good to see you today Return as needed or in 6 months for next physical.   Follow up plan: Return in about 6 months (around 11/24/2022), or if symptoms worsen or fail to improve, for annual exam, prior fasting for blood work.  JaRia BushMD

## 2022-05-25 NOTE — Assessment & Plan Note (Signed)
Discussed with patient. Encouraged try TED hoses OTC, to let me know if desires prescription grade compression stockings

## 2022-05-25 NOTE — Patient Instructions (Signed)
Flu shot today  You are doing well today - continue current medicines.  Try ted hose for venous insufficiency of legs, let me know if interested in higher strength prescription compression stockings.  Good to see you today Return as needed or in 6 months for next physical.

## 2022-05-27 ENCOUNTER — Encounter: Payer: Self-pay | Admitting: Family Medicine

## 2022-06-17 ENCOUNTER — Other Ambulatory Visit (HOSPITAL_COMMUNITY): Payer: Self-pay

## 2022-06-22 ENCOUNTER — Other Ambulatory Visit (HOSPITAL_COMMUNITY): Payer: Self-pay

## 2022-06-22 ENCOUNTER — Telehealth: Payer: Self-pay

## 2022-06-22 NOTE — Telephone Encounter (Signed)
Left message to return call to our office.  

## 2022-06-22 NOTE — Telephone Encounter (Signed)
Pharmacy Patient Advocate Encounter   Received notification from [facility] that prior authorization for Ozempic '4mg'$ /39m is required/requested.   PA submitted on 06/22/2022 to (ins) [employer] via CoverMyMeds  Key  [id number]  Status is drug is covered by current benefit plan. No further PA activity needed.

## 2022-06-24 ENCOUNTER — Encounter: Payer: Self-pay | Admitting: Family Medicine

## 2022-06-24 NOTE — Telephone Encounter (Signed)
Spoke with [facility] notifying them Ozempic rx does not need PA. Told it shows it will go through, however, the 1 mg is out of stock. Says they can put it on order for pt but not sure when it will come in. Plz advise.

## 2022-06-24 NOTE — Telephone Encounter (Addendum)
Would see if she has another pharmacy she would like [place] to try at. She should call them to see if they have ozempic '1mg'$  available.

## 2022-06-24 NOTE — Telephone Encounter (Signed)
Spoke with pt relaying Dr. Synthia Innocent message. Pt verbalizes understanding and will call back with the name and address of a different pharmacy or to let us know if she can't find it anywhere.

## 2022-06-26 NOTE — Telephone Encounter (Signed)
Pt called to let Kisa know she found out [facility] has ozempic in stock. Call back # [phone]

## 2022-06-27 MED ORDER — OZEMPIC (1 MG/DOSE) 4 MG/3ML ~~LOC~~ SOPN: 1.0000 mg | PEN_INJECTOR | SUBCUTANEOUS | 3 refills | Status: DC

## 2022-06-27 NOTE — Addendum Note (Signed)
Addended by: Ria Bush on: 06/27/2022 12:27 PM   Modules accepted: Orders

## 2022-09-03 ENCOUNTER — Encounter: Payer: Self-pay | Admitting: Infectious Diseases

## 2022-09-03 ENCOUNTER — Other Ambulatory Visit
Admission: RE | Admit: 2022-09-03 | Discharge: 2022-09-03 | Disposition: A | Discharge status: Home / Self Care | Payer: BC Managed Care – PPO | Source: Ambulatory Visit | Attending: Infectious Diseases | Admitting: Infectious Diseases

## 2022-09-03 ENCOUNTER — Ambulatory Visit: Payer: BC Managed Care – PPO | Admitting: Infectious Diseases

## 2022-09-03 ENCOUNTER — Ambulatory Visit: Payer: BC Managed Care – PPO | Attending: Infectious Diseases | Admitting: Infectious Diseases

## 2022-09-03 VITALS — BP 156/97 | HR 85 | Temp 97.0°F | Ht 68.0 in | Wt 291.0 lb

## 2022-09-03 DIAGNOSIS — F17200 Nicotine dependence, unspecified, uncomplicated: Secondary | ICD-10-CM | POA: Insufficient documentation

## 2022-09-03 DIAGNOSIS — I10 Essential (primary) hypertension: Secondary | ICD-10-CM | POA: Diagnosis not present

## 2022-09-03 DIAGNOSIS — Z23 Encounter for immunization: Secondary | ICD-10-CM | POA: Diagnosis not present

## 2022-09-03 DIAGNOSIS — B20 Human immunodeficiency virus [HIV] disease: Secondary | ICD-10-CM | POA: Insufficient documentation

## 2022-09-03 DIAGNOSIS — Z79899 Other long term (current) drug therapy: Secondary | ICD-10-CM | POA: Diagnosis not present

## 2022-09-03 DIAGNOSIS — E538 Deficiency of other specified B group vitamins: Secondary | ICD-10-CM | POA: Diagnosis not present

## 2022-09-03 DIAGNOSIS — E119 Type 2 diabetes mellitus without complications: Secondary | ICD-10-CM | POA: Insufficient documentation

## 2022-09-03 LAB — CBC WITH DIFFERENTIAL/PLATELET
Abs Immature Granulocytes: 0.01 10*3/uL (ref 0.00–0.07)
Basophils Absolute: 0 10*3/uL (ref 0.0–0.1)
Basophils Relative: 1 %
Eosinophils Absolute: 0.1 10*3/uL (ref 0.0–0.5)
Eosinophils Relative: 2 %
HCT: 39.4 % (ref 36.0–46.0)
Hemoglobin: 13.2 g/dL (ref 12.0–15.0)
Immature Granulocytes: 0 %
Lymphocytes Relative: 27 %
Lymphs Abs: 1.7 10*3/uL (ref 0.7–4.0)
MCH: 33.4 pg (ref 26.0–34.0)
MCHC: 33.5 g/dL (ref 30.0–36.0)
MCV: 99.7 fL (ref 80.0–100.0)
Monocytes Absolute: 0.4 10*3/uL (ref 0.1–1.0)
Monocytes Relative: 7 %
Neutro Abs: 4.1 10*3/uL (ref 1.7–7.7)
Neutrophils Relative %: 63 %
Platelets: 236 10*3/uL (ref 150–400)
RBC: 3.95 MIL/uL (ref 3.87–5.11)
RDW: 11.7 % (ref 11.5–15.5)
WBC: 6.5 10*3/uL (ref 4.0–10.5)
nRBC: 0 % (ref 0.0–0.2)

## 2022-09-03 LAB — COMPREHENSIVE METABOLIC PANEL
ALT: 13 U/L (ref 0–44)
AST: 18 U/L (ref 15–41)
Albumin: 3.4 g/dL — ABNORMAL LOW (ref 3.5–5.0)
Alkaline Phosphatase: 53 U/L (ref 38–126)
Anion gap: 7 (ref 5–15)
BUN: 14 mg/dL (ref 6–20)
CO2: 27 mmol/L (ref 22–32)
Calcium: 8.2 mg/dL — ABNORMAL LOW (ref 8.9–10.3)
Chloride: 103 mmol/L (ref 98–111)
Creatinine, Ser: 0.82 mg/dL (ref 0.44–1.00)
GFR, Estimated: >60 mL/min (ref >60)
Glucose, Bld: 89 mg/dL (ref 70–99)
Potassium: 3.9 mmol/L (ref 3.5–5.1)
Sodium: 137 mmol/L (ref 135–145)
Total Bilirubin: 1.1 mg/dL (ref 0.3–1.2)
Total Protein: 6.7 g/dL (ref 6.5–8.1)

## 2022-09-03 MED ORDER — BIKTARVY 50-200-25 MG PO TABS: 1.0000 | ORAL_TABLET | Freq: Every day | ORAL | 4 refills | Status: DC

## 2022-09-03 NOTE — Patient Instructions (Signed)
the REPRIEVE study regarding cardiovascular event risk reduction for [facility],  the 8 year trial initiation of statin (pitavastatin) therapy was shown to reduce CV disease by 35% independent of other risk factors.   The 10-year ASCVD risk score (Arnett DK, et al., 2019) is: 10.6%   Values used to calculate the score:     Age: 47 years     Sex: Female     Is Non-Hispanic African American: No     Diabetic: Yes     Tobacco smoker: Yes     Systolic Blood Pressure: [id number] mmHg     Is BP treated: Yes     HDL Cholesterol: 44.3 mg/dL     Total Cholesterol: 165 mg/dL  We discussed the patient's 10-year CVD Risk Score to be at least moderately elevated, and would benefit from intervention given HIV+ and > 14 yo.   lower-intensity statin pitavastatin   or any other statin can be used

## 2022-09-03 NOTE — Progress Notes (Signed)
NAME: Cassandra [name]  DOB: 13-Oct-1975  MRN: [id number]  Date/Time: 09/03/2022 9:32 AM   Subjective:  Here for follow up of HIV Last seen March 2023 No significant change in her medical conditions   She is 100% adherent to HAART-on Biktarvy Last Vl < 20 and Cd4 1178 on 11/19/20 Weight loss on ozempic [id number] Had hysterectomy/b salpingectomy in March 2022 Foot surgery June 2022 Doing well   Medical history HIV diagnosed 2013 Nadir Cd4 >200 OI none HAARt history: first regimen Atripla 2nd regimen Biktarvy since Sept 2018  Acquired thru heterosexual contact. Husband HIV positive and on Rx   Past medical history Allergies to animal dander Gestational diabetes mellitus Diabetes mellitus GERD Hypertension Obesity Varicose veins Migraine Endometrial ablation for menorrhagia Hysterectomy Gall stones Depression  Tubular adenoma colon.  Colonoscopy done in January 2020.   Past Surgical History:  Procedure Laterality Date   BIOPSY  07/04/2018   Procedure: BIOPSY;  Surgeon: Thornton Park, MD;  Location: [facility];  Service: Gastroenterology;;   CESAREAN SECTION  [phone]   x2   CHOLECYSTECTOMY  2018   COLONOSCOPY WITH PROPOFOL N/A 07/04/2018   for chronic diarrhea after cholecystectomy started on colestipol with benefit ([facility]). Thought she has IBS-D. 2 TA, rpt 5 yrs   CYSTOSCOPY N/A 08/20/2020   Procedure: CYSTOSCOPY;  Surgeon: Aletha Halim, MD;  Location: [place];  Service: Gynecology;  Laterality: N/A;   ENDOMETRIAL BIOPSY  01/25/2020       HYSTERECTOMY ABDOMINAL WITH SALPINGECTOMY Bilateral 08/20/2020   done for heavy bleed and pelvic pain - pathology showed CIN1 of cervix Ilda Basset, Eduard Clos, MD)   HYSTEROSCOPY WITH D & C N/A 01/05/2018   Minerva for heavy bleeding, IUD removed - DILATATION AND CURETTAGE /HYSTEROSCOPY WITH MINERVA ABLATION; [name], MD   INTRAUTERINE DEVICE INSERTION     Mirena   POLYPECTOMY  07/04/2018   Procedure:  POLYPECTOMY;  Surgeon: Thornton Park, MD;  Location: [facility];  Service: Gastroenterology;;   TOE SURGERY Left 2022   TOTAL LAPAROSCOPIC HYSTERECTOMY WITH SALPINGECTOMY Bilateral 08/20/2020   Procedure: ATTEMPTED TOTAL LAPAROSCOPIC HYSTERECTOMY WITH SALPINGECTOMY;  Surgeon: Aletha Halim, MD;  Location: [place];  Service: Gynecology;  Laterality: Bilateral;   TUBAL LIGATION     WISDOM TOOTH EXTRACTION     x 4     SH Current smoker Occasional alcohol Lives with her husband [name] female partner  and 2 sons    Family History  Problem Relation Age of Onset   Hypertension Mother    Hypertension Father    Appendicitis Father    Atrial fibrillation Father    Diabetes Maternal Grandmother        s/p amputation   Hyperlipidemia Maternal Grandmother    Hypertension Maternal Grandmother    Stroke Maternal Grandmother    Hyperlipidemia Maternal Grandfather    Hypertension Maternal Grandfather    Skin cancer Maternal Grandfather    Diabetes Maternal Grandfather    Seizures Paternal Grandmother 42       deceased   Coronary artery disease Paternal Grandfather 53       MI   Breast cancer Neg Hx    Colon cancer Neg Hx    Colon polyps Neg Hx    Esophageal cancer Neg Hx    Rectal cancer Neg Hx    Stomach cancer Neg Hx    Allergies  Allergen Reactions   Metformin And Related Diarrhea   Nickel Rash   ? Current Outpatient Medications  Medication Sig Dispense Refill  Accu-Chek FastClix Lancets MISC 1 each by Does not apply route as directed. Use as directed to check blood sugar twice daily as needed. E11.8 102 each 3   amLODipine (NORVASC) 5 MG tablet Take 1 tablet (5 mg total) by mouth daily. 90 tablet 3   BIKTARVY 50-200-25 MG TABS tablet Take 1 tablet by mouth at bedtime. 90 tablet 4   citalopram (CELEXA) 20 MG tablet Take 1 tablet (20 mg total) by mouth daily. 90 tablet 3   colestipol (COLESTID) 1 g tablet TAKE 2 TABLETS (2 G TOTAL) BY MOUTH 2 (TWO) TIMES DAILY. (Patient  taking differently: Take 1 g by mouth daily.) 360 tablet 0   cyclobenzaprine (FLEXERIL) 5 MG tablet Take 1 tablet (5 mg total) by mouth 2 (two) times daily as needed (headache (sedation precautions)). 30 tablet 3   diphenhydrAMINE (BENADRYL) 25 MG tablet Take 25 mg by mouth at bedtime as needed for allergies or sleep.     glucose blood (ACCU-CHEK GUIDE) test strip 1 each by Other route as needed for other. Use as instructed to check blood sugar 100 each 0   lisinopril (ZESTRIL) 10 MG tablet Take 1 tablet (10 mg total) by mouth daily. 90 tablet 3   loratadine (CLARITIN) 10 MG tablet Take 10 mg by mouth daily as needed for allergies.     meloxicam (MOBIC) 15 MG tablet Take 15 mg by mouth daily.     Multiple Vitamin (MULTIVITAMIN ADULT) TABS Take 1 tablet by mouth daily.     Peppermint Oil (IBGARD PO) Take 2 tablets by mouth daily as needed (diarrhea).     Semaglutide, 1 MG/DOSE, (OZEMPIC, 1 MG/DOSE,) 4 MG/3ML SOPN Inject 1 mg as directed once a week. 9 mL 3   No current facility-administered medications for this visit.    REVIEW OF SYSTEMS:  Const: negative fever, negative chills, 30 pound weight loss Eyes: negative diplopia or visual changes, negative eye pain ENT: negative coryza, negative sore throat Resp: negative cough, hemoptysis, dyspnea Cards: negative for chest pain, palpitations, lower extremity edema GU: negative for frequency, dysuria and hematuria Skin: negative for rash and pruritus Heme: negative for easy bruising and gum/nose bleeding MS: negative for myalgias, arthralgias, back pain and muscle weakness Neurolo:none  Psych:stress   Objective:  VITALS:  BP (!) 156/97   Pulse 85   Temp (!) 97 F (36.1 C) (Temporal)   Ht 5\' 8"  (1.727 m)   Wt 291 lb (132 kg)   LMP 07/10/2020   BMI 44.25 kg/m    PHYSICAL EXAM:  General:well Oral cavity no lesions Neck supple No lymphadenopathy Lungs: Clear to auscultation bilaterally. No Wheezing or Rhonchi. No rales. Heart:  Regular rate and rhythm, no murmur, rub or gallop. Abdomen: Soft, non-tender,not distended. Bowel sounds normal. No masses Scar healed well Extremities: Extremities normal, atraumatic, no cyanosis. No edema. No clubbing Skin: No rashes or lesions. Not Jaundiced Lymph: Cervical, supraclavicular normal. Neurologic: Grossly non-focal Pertinent Labs Health maintenance:  Vaccination  Vaccine Date last given comment  Influenza 05/18/19   Hepatitis B 3/10, 5/19 and 02/16/19   Hepatitis A 08/16/18 and 02/16/19   Prevnar-PCV-13    Pneumovac-PPSV-23 01/04/15   TdaP 12/14/12   HPV    Shingrix ( zoster vaccine)     ______________________  Labs Lab Result  Date comment  HIV VL  <20 09/02/21   CD4 1060 3/23   Genotype     [id number] Neg 08/16/2018   HIV antibody     RPR  nonreactive  09/02/21   Quantiferon Gold  negative 09/02/21   Hep C ab Neg 03/30/18   Hepatitis B-ab,ag,c Sag-Neg, Sab neg Cab neg 03/30/18   Hepatitis A-IgM, IgG /T Neg 03/30/18   Lipid TC 170, HDL 41, LDL 108 TGL 99 11/26/17   GC/CHL neg 01/25/20   PAP High risk HPV + 01/25/20          Preventive  Procedure Result  Date comment  colonoscopy Tubular adenoma 07/03/18   Mammogram Normal 11/26/17   Dental exam     Opthal       Impression/Recommendation 47 y.o. female with a history of HIV, hypertension , DM              ? ?HIV under good control.  On Biktarvy.  100% adherent.  Viral load less than 20 and CD4 is 1178 80 pound weight loss since starting ozempic ( semaglutide  HTN  on  triple therapy- amlodipine+ HCTZ= ACEI  DM started on ozempic    B12 deficiency on supplement ( she has been on long term PPI which can cause it)   Health maintenance updated-  Partner notification done  Smoker   With new research coming out by way of the REPRIEVE study regarding cardiovascular event risk reduction for PLWH, I discussed that over the 8 year trial initiation of statin (pitavastatin) therapy was shown to reduce CV  disease by 35% independent of other risk factors.   The 10-year ASCVD risk score (Arnett DK, et al., 2019) is: 10.6%   Values used to calculate the score:     Age: 9 years     Sex: Female     Is Non-Hispanic African American: No     Diabetic: Yes     Tobacco smoker: Yes     Systolic Blood Pressure: [id number] mmHg     Is BP treated: Yes     HDL Cholesterol: 44.3 mg/dL     Total Cholesterol: 165 mg/dL  We discussed the patient's 10-year CVD Risk Score to be at least moderately elevated, and would benefit from intervention given HIV+ and > 67 yo.   She is going to think about it Discussed the management with patient - follow up 12 months or earlier Prevnar 20 vaccine given today  After visit Will need to test/treat for STD- as her spouse positive for chlamydia

## 2022-09-04 ENCOUNTER — Encounter: Payer: Self-pay | Admitting: Infectious Diseases

## 2022-09-04 LAB — T-HELPER CELLS CD4/CD8 %
% CD 4 Pos. Lymph.: 54.1 % (ref 30.8–58.5)
Absolute CD 4 Helper: 920 /uL (ref 359–1519)
Basophils Absolute: 0 10*3/uL (ref 0.0–0.2)
Basos: 1 %
CD3+CD4+ Cells/CD3+CD8+ Cells Bld: 2.23 (ref 0.92–3.72)
CD3+CD8+ Cells # Bld: 413 /uL (ref 109–897)
CD3+CD8+ Cells NFr Bld: 24.3 % (ref 12.0–35.5)
EOS (ABSOLUTE): 0.2 10*3/uL (ref 0.0–0.4)
Eos: 3 %
Hematocrit: 39.8 % (ref 34.0–46.6)
Hemoglobin: 13.3 g/dL (ref 11.1–15.9)
Immature Grans (Abs): 0 10*3/uL (ref 0.0–0.1)
Immature Granulocytes: 0 %
Lymphocytes Absolute: 1.7 10*3/uL (ref 0.7–3.1)
Lymphs: 27 %
MCH: 32.6 pg (ref 26.6–33.0)
MCHC: 33.4 g/dL (ref 31.5–35.7)
MCV: 98 fL — ABNORMAL HIGH (ref 79–97)
Monocytes Absolute: 0.4 10*3/uL (ref 0.1–0.9)
Monocytes: 7 %
Neutrophils Absolute: 4 10*3/uL (ref 1.4–7.0)
Neutrophils: 62 %
Platelets: 264 10*3/uL (ref 150–450)
RBC: 4.08 x10E6/uL (ref 3.77–5.28)
RDW: 11.6 % — ABNORMAL LOW (ref 11.7–15.4)
WBC: 6.3 10*3/uL (ref 3.4–10.8)

## 2022-09-04 LAB — HIV-1 RNA QUANT-NO REFLEX-BLD
HIV 1 RNA Quant: <20 copies/mL
LOG10 HIV-1 RNA: UNDETERMINED log10copy/mL

## 2022-09-04 LAB — RPR: RPR Ser Ql: NONREACTIVE

## 2022-09-08 ENCOUNTER — Ambulatory Visit: Payer: BC Managed Care – PPO | Attending: Infectious Diseases | Admitting: Infectious Diseases

## 2022-09-08 ENCOUNTER — Encounter: Payer: Self-pay | Admitting: Infectious Diseases

## 2022-09-08 ENCOUNTER — Other Ambulatory Visit
Admission: RE | Admit: 2022-09-08 | Discharge: 2022-09-08 | Disposition: A | Discharge status: Home / Self Care | Payer: BC Managed Care – PPO | Source: Ambulatory Visit | Attending: Infectious Diseases | Admitting: Infectious Diseases

## 2022-09-08 ENCOUNTER — Other Ambulatory Visit: Payer: Self-pay | Admitting: Infectious Diseases

## 2022-09-08 VITALS — Wt 291.0 lb

## 2022-09-08 DIAGNOSIS — Z202 Contact with and (suspected) exposure to infections with a predominantly sexual mode of transmission: Secondary | ICD-10-CM

## 2022-09-08 DIAGNOSIS — J029 Acute pharyngitis, unspecified: Secondary | ICD-10-CM

## 2022-09-08 DIAGNOSIS — M5412 Radiculopathy, cervical region: Secondary | ICD-10-CM | POA: Diagnosis not present

## 2022-09-08 DIAGNOSIS — M546 Pain in thoracic spine: Secondary | ICD-10-CM | POA: Diagnosis not present

## 2022-09-08 DIAGNOSIS — M9902 Segmental and somatic dysfunction of thoracic region: Secondary | ICD-10-CM | POA: Diagnosis not present

## 2022-09-08 DIAGNOSIS — M9901 Segmental and somatic dysfunction of cervical region: Secondary | ICD-10-CM | POA: Diagnosis not present

## 2022-09-08 LAB — CHLAMYDIA/NGC RT PCR (ARMC ONLY)
Chlamydia Tr: NOT DETECTED
Chlamydia Tr: NOT DETECTED
Chlamydia Tr: NOT DETECTED
N gonorrhoeae: NOT DETECTED
N gonorrhoeae: NOT DETECTED
N gonorrhoeae: NOT DETECTED

## 2022-09-08 LAB — GROUP A STREP BY PCR: Group A Strep by PCR: NOT DETECTED

## 2022-09-08 NOTE — Progress Notes (Unsigned)
For STD screening Partner chlamydia throat Will check GC/Chl today

## 2022-09-09 ENCOUNTER — Telehealth: Payer: Self-pay

## 2022-09-09 DIAGNOSIS — M9901 Segmental and somatic dysfunction of cervical region: Secondary | ICD-10-CM | POA: Diagnosis not present

## 2022-09-09 DIAGNOSIS — M5412 Radiculopathy, cervical region: Secondary | ICD-10-CM | POA: Diagnosis not present

## 2022-09-09 DIAGNOSIS — M546 Pain in thoracic spine: Secondary | ICD-10-CM | POA: Diagnosis not present

## 2022-09-09 DIAGNOSIS — M9902 Segmental and somatic dysfunction of thoracic region: Secondary | ICD-10-CM | POA: Diagnosis not present

## 2022-09-09 NOTE — Telephone Encounter (Signed)
-----   Message from Tsosie Billing, MD sent at 09/09/2022 10:35 AM EDT ----- Can you please let her know that STD screen was negative and so was strep throat. All good. thx ----- Message ----- From: Buel Ream, [facility] [place] Sent: 09/08/2022  12:48 PM EDT To: Tsosie Billing, MD

## 2022-09-11 DIAGNOSIS — M5412 Radiculopathy, cervical region: Secondary | ICD-10-CM | POA: Diagnosis not present

## 2022-09-11 DIAGNOSIS — M546 Pain in thoracic spine: Secondary | ICD-10-CM | POA: Diagnosis not present

## 2022-09-11 DIAGNOSIS — M9901 Segmental and somatic dysfunction of cervical region: Secondary | ICD-10-CM | POA: Diagnosis not present

## 2022-09-11 DIAGNOSIS — M9902 Segmental and somatic dysfunction of thoracic region: Secondary | ICD-10-CM | POA: Diagnosis not present

## 2022-09-12 LAB — QUANTIFERON-TB GOLD PLUS (RQFGPL)
QuantiFERON Mitogen Value: >10 IU/mL
QuantiFERON Nil Value: 0.05 IU/mL
QuantiFERON TB1 Ag Value: 0.07 IU/mL
QuantiFERON TB2 Ag Value: 0.05 IU/mL

## 2022-09-12 LAB — QUANTIFERON-TB GOLD PLUS: QuantiFERON-TB Gold Plus: NEGATIVE

## 2022-09-14 DIAGNOSIS — M546 Pain in thoracic spine: Secondary | ICD-10-CM | POA: Diagnosis not present

## 2022-09-14 DIAGNOSIS — M9902 Segmental and somatic dysfunction of thoracic region: Secondary | ICD-10-CM | POA: Diagnosis not present

## 2022-09-14 DIAGNOSIS — M5412 Radiculopathy, cervical region: Secondary | ICD-10-CM | POA: Diagnosis not present

## 2022-09-14 DIAGNOSIS — M9901 Segmental and somatic dysfunction of cervical region: Secondary | ICD-10-CM | POA: Diagnosis not present

## 2022-09-16 DIAGNOSIS — M546 Pain in thoracic spine: Secondary | ICD-10-CM | POA: Diagnosis not present

## 2022-09-16 DIAGNOSIS — M5412 Radiculopathy, cervical region: Secondary | ICD-10-CM | POA: Diagnosis not present

## 2022-09-16 DIAGNOSIS — M9901 Segmental and somatic dysfunction of cervical region: Secondary | ICD-10-CM | POA: Diagnosis not present

## 2022-09-16 DIAGNOSIS — M9902 Segmental and somatic dysfunction of thoracic region: Secondary | ICD-10-CM | POA: Diagnosis not present

## 2022-09-18 DIAGNOSIS — M9902 Segmental and somatic dysfunction of thoracic region: Secondary | ICD-10-CM | POA: Diagnosis not present

## 2022-09-18 DIAGNOSIS — M9901 Segmental and somatic dysfunction of cervical region: Secondary | ICD-10-CM | POA: Diagnosis not present

## 2022-09-18 DIAGNOSIS — M5412 Radiculopathy, cervical region: Secondary | ICD-10-CM | POA: Diagnosis not present

## 2022-09-18 DIAGNOSIS — M546 Pain in thoracic spine: Secondary | ICD-10-CM | POA: Diagnosis not present

## 2022-09-21 DIAGNOSIS — M9902 Segmental and somatic dysfunction of thoracic region: Secondary | ICD-10-CM | POA: Diagnosis not present

## 2022-09-21 DIAGNOSIS — M546 Pain in thoracic spine: Secondary | ICD-10-CM | POA: Diagnosis not present

## 2022-09-21 DIAGNOSIS — M9901 Segmental and somatic dysfunction of cervical region: Secondary | ICD-10-CM | POA: Diagnosis not present

## 2022-09-21 DIAGNOSIS — M5412 Radiculopathy, cervical region: Secondary | ICD-10-CM | POA: Diagnosis not present

## 2022-09-23 DIAGNOSIS — M546 Pain in thoracic spine: Secondary | ICD-10-CM | POA: Diagnosis not present

## 2022-09-23 DIAGNOSIS — M9901 Segmental and somatic dysfunction of cervical region: Secondary | ICD-10-CM | POA: Diagnosis not present

## 2022-09-23 DIAGNOSIS — M9902 Segmental and somatic dysfunction of thoracic region: Secondary | ICD-10-CM | POA: Diagnosis not present

## 2022-09-23 DIAGNOSIS — M5412 Radiculopathy, cervical region: Secondary | ICD-10-CM | POA: Diagnosis not present

## 2022-09-25 DIAGNOSIS — M9902 Segmental and somatic dysfunction of thoracic region: Secondary | ICD-10-CM | POA: Diagnosis not present

## 2022-09-25 DIAGNOSIS — M9901 Segmental and somatic dysfunction of cervical region: Secondary | ICD-10-CM | POA: Diagnosis not present

## 2022-09-25 DIAGNOSIS — M5412 Radiculopathy, cervical region: Secondary | ICD-10-CM | POA: Diagnosis not present

## 2022-09-25 DIAGNOSIS — M546 Pain in thoracic spine: Secondary | ICD-10-CM | POA: Diagnosis not present

## 2022-09-28 DIAGNOSIS — M9901 Segmental and somatic dysfunction of cervical region: Secondary | ICD-10-CM | POA: Diagnosis not present

## 2022-09-28 DIAGNOSIS — M5412 Radiculopathy, cervical region: Secondary | ICD-10-CM | POA: Diagnosis not present

## 2022-09-28 DIAGNOSIS — M546 Pain in thoracic spine: Secondary | ICD-10-CM | POA: Diagnosis not present

## 2022-09-28 DIAGNOSIS — M9902 Segmental and somatic dysfunction of thoracic region: Secondary | ICD-10-CM | POA: Diagnosis not present

## 2022-09-30 DIAGNOSIS — M546 Pain in thoracic spine: Secondary | ICD-10-CM | POA: Diagnosis not present

## 2022-09-30 DIAGNOSIS — M9902 Segmental and somatic dysfunction of thoracic region: Secondary | ICD-10-CM | POA: Diagnosis not present

## 2022-09-30 DIAGNOSIS — M9901 Segmental and somatic dysfunction of cervical region: Secondary | ICD-10-CM | POA: Diagnosis not present

## 2022-09-30 DIAGNOSIS — M5412 Radiculopathy, cervical region: Secondary | ICD-10-CM | POA: Diagnosis not present

## 2022-10-02 DIAGNOSIS — M546 Pain in thoracic spine: Secondary | ICD-10-CM | POA: Diagnosis not present

## 2022-10-02 DIAGNOSIS — M5412 Radiculopathy, cervical region: Secondary | ICD-10-CM | POA: Diagnosis not present

## 2022-10-02 DIAGNOSIS — M9901 Segmental and somatic dysfunction of cervical region: Secondary | ICD-10-CM | POA: Diagnosis not present

## 2022-10-02 DIAGNOSIS — M9902 Segmental and somatic dysfunction of thoracic region: Secondary | ICD-10-CM | POA: Diagnosis not present

## 2022-10-05 DIAGNOSIS — M9902 Segmental and somatic dysfunction of thoracic region: Secondary | ICD-10-CM | POA: Diagnosis not present

## 2022-10-05 DIAGNOSIS — M5412 Radiculopathy, cervical region: Secondary | ICD-10-CM | POA: Diagnosis not present

## 2022-10-05 DIAGNOSIS — M546 Pain in thoracic spine: Secondary | ICD-10-CM | POA: Diagnosis not present

## 2022-10-05 DIAGNOSIS — M9901 Segmental and somatic dysfunction of cervical region: Secondary | ICD-10-CM | POA: Diagnosis not present

## 2022-10-07 DIAGNOSIS — M546 Pain in thoracic spine: Secondary | ICD-10-CM | POA: Diagnosis not present

## 2022-10-07 DIAGNOSIS — M9901 Segmental and somatic dysfunction of cervical region: Secondary | ICD-10-CM | POA: Diagnosis not present

## 2022-10-07 DIAGNOSIS — M9902 Segmental and somatic dysfunction of thoracic region: Secondary | ICD-10-CM | POA: Diagnosis not present

## 2022-10-07 DIAGNOSIS — M5412 Radiculopathy, cervical region: Secondary | ICD-10-CM | POA: Diagnosis not present

## 2022-10-09 ENCOUNTER — Emergency Department: Payer: BC Managed Care – PPO

## 2022-10-09 ENCOUNTER — Other Ambulatory Visit: Payer: Self-pay

## 2022-10-09 ENCOUNTER — Emergency Department
Admission: EM | Admit: 2022-10-09 | Discharge: 2022-10-09 | Disposition: A | Discharge status: Home / Self Care | Payer: BC Managed Care – PPO | Attending: Emergency Medicine | Admitting: Emergency Medicine

## 2022-10-09 ENCOUNTER — Encounter: Payer: Self-pay | Admitting: Emergency Medicine

## 2022-10-09 ENCOUNTER — Ambulatory Visit
Admission: EM | Admit: 2022-10-09 | Discharge: 2022-10-09 | Disposition: A | Discharge status: Home / Self Care | Payer: BC Managed Care – PPO | Attending: Family Medicine | Admitting: Family Medicine

## 2022-10-09 ENCOUNTER — Encounter (INDEPENDENT_AMBULATORY_CARE_PROVIDER_SITE_OTHER): Payer: BC Managed Care – PPO | Admitting: Family Medicine

## 2022-10-09 DIAGNOSIS — I10 Essential (primary) hypertension: Secondary | ICD-10-CM | POA: Insufficient documentation

## 2022-10-09 DIAGNOSIS — R1031 Right lower quadrant pain: Secondary | ICD-10-CM | POA: Insufficient documentation

## 2022-10-09 DIAGNOSIS — N2889 Other specified disorders of kidney and ureter: Secondary | ICD-10-CM | POA: Diagnosis not present

## 2022-10-09 DIAGNOSIS — N200 Calculus of kidney: Secondary | ICD-10-CM | POA: Diagnosis not present

## 2022-10-09 DIAGNOSIS — E119 Type 2 diabetes mellitus without complications: Secondary | ICD-10-CM | POA: Diagnosis not present

## 2022-10-09 DIAGNOSIS — Z21 Asymptomatic human immunodeficiency virus [HIV] infection status: Secondary | ICD-10-CM | POA: Insufficient documentation

## 2022-10-09 LAB — COMPREHENSIVE METABOLIC PANEL
ALT: 14 U/L (ref 0–44)
AST: 21 U/L (ref 15–41)
Albumin: 3.8 g/dL (ref 3.5–5.0)
Alkaline Phosphatase: 64 U/L (ref 38–126)
Anion gap: 8 (ref 5–15)
BUN: 11 mg/dL (ref 6–20)
CO2: 26 mmol/L (ref 22–32)
Calcium: 8.9 mg/dL (ref 8.9–10.3)
Chloride: 103 mmol/L (ref 98–111)
Creatinine, Ser: 0.88 mg/dL (ref 0.44–1.00)
GFR, Estimated: >60 mL/min (ref >60)
Glucose, Bld: 106 mg/dL — ABNORMAL HIGH (ref 70–99)
Potassium: 3.9 mmol/L (ref 3.5–5.1)
Sodium: 137 mmol/L (ref 135–145)
Total Bilirubin: 1 mg/dL (ref 0.3–1.2)
Total Protein: 7.4 g/dL (ref 6.5–8.1)

## 2022-10-09 LAB — CBC
HCT: 42.3 % (ref 36.0–46.0)
Hemoglobin: 14.3 g/dL (ref 12.0–15.0)
MCH: 33.5 pg (ref 26.0–34.0)
MCHC: 33.8 g/dL (ref 30.0–36.0)
MCV: 99.1 fL (ref 80.0–100.0)
Platelets: 297 10*3/uL (ref 150–400)
RBC: 4.27 MIL/uL (ref 3.87–5.11)
RDW: 11.4 % — ABNORMAL LOW (ref 11.5–15.5)
WBC: 8.9 10*3/uL (ref 4.0–10.5)
nRBC: 0 % (ref 0.0–0.2)

## 2022-10-09 LAB — URINALYSIS, ROUTINE W REFLEX MICROSCOPIC
Bilirubin Urine: NEGATIVE
Glucose, UA: NEGATIVE mg/dL
Hgb urine dipstick: NEGATIVE
Ketones, ur: NEGATIVE mg/dL
Leukocytes,Ua: NEGATIVE
Nitrite: NEGATIVE
Protein, ur: NEGATIVE mg/dL
Specific Gravity, Urine: 1.018 (ref 1.005–1.030)
pH: 5 (ref 5.0–8.0)

## 2022-10-09 LAB — LIPASE, BLOOD: Lipase: 31 U/L (ref 11–51)

## 2022-10-09 MED ORDER — IOHEXOL 300 MG/ML  SOLN
100.0000 mL | Freq: Once | INTRAMUSCULAR | Status: AC | PRN
  Administered 2022-10-09: 100 mL via INTRAVENOUS

## 2022-10-09 MED ORDER — HYDROCODONE-ACETAMINOPHEN 5-325 MG PO TABS: 1.0000 | ORAL_TABLET | Freq: Three times a day (TID) | ORAL | 0 refills | Status: AC | PRN

## 2022-10-09 NOTE — ED Notes (Signed)
Patient is resting in a darkened room per patient's request for headache.

## 2022-10-09 NOTE — ED Provider Notes (Signed)
[facility]    CSN: [id number] Arrival date & time: 10/09/22  1037      History   Chief Complaint Chief Complaint  Patient presents with   Abdominal Pain    RLQ    HPI Cassandra [name] is a 46 y.o. female.   HPI  Cassandra [name] presents for sharp constant RLQ abdominal pain that started this morning after waking up and is getting worse.  Reports RLQ abdominal pain that travels to her groin. Has history of kidney stones, GERD, fatty liver.   She has 2 C-sections, hysterectomy and her gallbladder removed.  Symptoms Nausea/Vomiting: yes, nausea   Diarrhea: no  Constipation: no  Melena/BRBPR: no  Hematemesis: no  Anorexia: decreased Fever/Chills: no  Dysuria: no  Urinary frequency/ urgency: no Rash: no  Wt loss: not unintended, Ozempic   EtOH use: rarely   NSAIDs/ASA: not recently  Vaginal bleeding: no  STD risk/hx: yes  Sore throat: no   Cough: no Nasal congestion : no  Sleep disturbance: sleeping  Back Pain: not new  Headache: yes but resolved   Past Medical History:  Diagnosis Date   Allergy    Allergy to animal dander    cats and dogs   Anxiety    Arthritis    Back   Complication of anesthesia    Pt does not want an epidural, have had reactions to that medication   Constipation    alternating from constipation to diarrhea   COVID-19 07/12/2020   Depression    Diabetes mellitus without complication (HCC)    Type II   Gallstones 06/2016   by xray - surgery to remove   Generalized headaches    frequent   GERD (gastroesophageal reflux disease)    History of abnormal Pap smear    remote   History of gestational diabetes    first 2 pregnancies   History of kidney stones    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HCC)    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    Hypertension    IBS (irritable bowel syndrome)    Morbid obesity (HCC)    Periodontal disease 08/2011   currently getting dental work   Tobacco use     Patient Active Problem List    Diagnosis Date Noted   Ganglion cyst of dorsum of left wrist 11/21/2021   Dysplasia of cervix, low grade (CIN 1) 09/26/2020   COVID-19 07/13/2020   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   Migraine 06/30/2019   Vitamin B12 deficiency 12/31/2018   GERD (gastroesophageal reflux disease) 12/27/2018   Polyp of descending colon    Chronic diarrhea 04/04/2018   Closed compression fracture of L1 lumbar vertebra 07/05/2016   Lumbar pain with radiation down both legs 12/20/2015   Health maintenance examination 05/25/2014   Ulnar neuropathy 06/28/2013   Hypertension 09/14/2012   HIV (human immunodeficiency virus infection) (HCC) 01/04/2012   Chronic venous insufficiency of lower extremity 01/01/2012   MDD (major depressive disorder), recurrent episode, moderate (HCC) 09/02/2011   Body mass index (BMI) of 40.0 to 44.9 in adult [facility])    Ex-smoker    Type 2 diabetes mellitus with other specified complication [facility])     Past Surgical History:  Procedure Laterality Date   BIOPSY  07/04/2018   Procedure: BIOPSY;  Surgeon: Tressia Danas, MD;  Location: [facility];  Service: Gastroenterology;;   CESAREAN SECTION  [phone]   x2   CHOLECYSTECTOMY  2018   COLONOSCOPY WITH PROPOFOL N/A  07/04/2018   for chronic diarrhea after cholecystectomy started on colestipol with benefit ([facility]). Thought she has IBS-D. 2 TA, rpt 5 yrs   CYSTOSCOPY N/A 08/20/2020   Procedure: CYSTOSCOPY;  Surgeon: [place] Bing, MD;  Location: [facility];  Service: Gynecology;  Laterality: N/A;   ENDOMETRIAL BIOPSY  01/25/2020       HYSTERECTOMY ABDOMINAL WITH SALPINGECTOMY Bilateral 08/20/2020   done for heavy bleed and pelvic pain - pathology showed CIN1 of cervix Vergie Living, Billey Gosling, MD)   HYSTEROSCOPY WITH D & C N/A 01/05/2018   Minerva for heavy bleeding, IUD removed - DILATATION AND CURETTAGE /HYSTEROSCOPY WITH MINERVA ABLATION; Reva Bores, MD   INTRAUTERINE DEVICE INSERTION     Mirena   POLYPECTOMY   07/04/2018   Procedure: POLYPECTOMY;  Surgeon: Tressia Danas, MD;  Location: [facility];  Service: Gastroenterology;;   TOE SURGERY Left 2022   TOTAL LAPAROSCOPIC HYSTERECTOMY WITH SALPINGECTOMY Bilateral 08/20/2020   Procedure: ATTEMPTED TOTAL LAPAROSCOPIC HYSTERECTOMY WITH SALPINGECTOMY;  Surgeon: [place] Bing, MD;  Location: [facility] OR;  Service: Gynecology;  Laterality: Bilateral;   TUBAL LIGATION     WISDOM TOOTH EXTRACTION     x 4    OB History     Gravida  2   Para  2   Term  2   Preterm  0   AB  0   Living  2      SAB  0   IAB  0   Ectopic  0   Multiple  0   Live Births  2            Home Medications    Prior to Admission medications   Medication Sig Start Date End Date Taking? Authorizing Provider  Accu-Chek FastClix Lancets MISC 1 each by Does not apply route as directed. Use as directed to check blood sugar twice daily as needed. E11.8 10/03/19   Eustaquio Boyden, MD  amLODipine (NORVASC) 5 MG tablet Take 1 tablet (5 mg total) by mouth daily. 11/21/21   Eustaquio Boyden, MD  BIKTARVY 50-200-25 MG TABS tablet Take 1 tablet by mouth at bedtime. 09/03/22   Lynn Ito, MD  citalopram (CELEXA) 20 MG tablet Take 1 tablet (20 mg total) by mouth daily. 11/21/21   Eustaquio Boyden, MD  colestipol (COLESTID) 1 g tablet TAKE 2 TABLETS (2 G TOTAL) BY MOUTH 2 (TWO) TIMES DAILY. Patient taking differently: Take 1 g by mouth daily. 05/23/19   Tressia Danas, MD  cyclobenzaprine (FLEXERIL) 5 MG tablet Take 1 tablet (5 mg total) by mouth 2 (two) times daily as needed (headache (sedation precautions)). 08/26/21   Eustaquio Boyden, MD  diphenhydrAMINE (BENADRYL) 25 MG tablet Take 25 mg by mouth at bedtime as needed for allergies or sleep.    [provider]  glucose blood (ACCU-CHEK GUIDE) test strip 1 each by Other route as needed for other. Use as instructed to check blood sugar 01/17/19   Eustaquio Boyden, MD  lisinopril (ZESTRIL) 10 MG  tablet Take 1 tablet (10 mg total) by mouth daily. 11/21/21   Eustaquio Boyden, MD  loratadine (CLARITIN) 10 MG tablet Take 10 mg by mouth daily as needed for allergies.    [provider]  meloxicam (MOBIC) 15 MG tablet Take 15 mg by mouth daily. 12/19/21   [provider]  Multiple Vitamin (MULTIVITAMIN ADULT) TABS Take 1 tablet by mouth daily. 05/25/22   Eustaquio Boyden, MD  Peppermint Oil (IBGARD PO) Take 2 tablets by mouth daily as needed (diarrhea).  [provider]  Semaglutide, 1 MG/DOSE, (OZEMPIC, 1 MG/DOSE,) 4 MG/3ML SOPN Inject 1 mg as directed once a week. 06/27/22   Eustaquio Boyden, MD    Family History Family History  Problem Relation Age of Onset   Hypertension Mother    Hypertension Father    Appendicitis Father    Atrial fibrillation Father    Diabetes Maternal Grandmother        s/p amputation   Hyperlipidemia Maternal Grandmother    Hypertension Maternal Grandmother    Stroke Maternal Grandmother    Hyperlipidemia Maternal Grandfather    Hypertension Maternal Grandfather    Skin cancer Maternal Grandfather    Diabetes Maternal Grandfather    Seizures Paternal Grandmother 21       deceased   Coronary artery disease Paternal Grandfather 49       MI   Breast cancer Neg Hx    Colon cancer Neg Hx    Colon polyps Neg Hx    Esophageal cancer Neg Hx    Rectal cancer Neg Hx    Stomach cancer Neg Hx     Social History Social History   Tobacco Use   Smoking status: Some Days    Packs/day: 0.10    Years: 23.00    Additional pack years: 0.00    Total pack years: 2.30    Types: Cigarettes   Smokeless tobacco: Never   Tobacco comments:    1 every now and then  Vaping Use   Vaping Use: Never used  Substance Use Topics   Alcohol use: Yes    Alcohol/week: 0.0 standard drinks of alcohol    Comment: occasional   Drug use: No     Allergies   Metformin and related and Nickel   Review of Systems Review of Systems :negative  unless otherwise stated in HPI.      Physical Exam Triage Vital Signs ED Triage Vitals  Enc Vitals Group     BP 10/09/22 1048 137/83     Pulse Rate 10/09/22 1048 90     Resp 10/09/22 1048 15     Temp 10/09/22 1048 98.7 F (37.1 C)     Temp Source 10/09/22 1048 Oral     SpO2 10/09/22 1048 96 %     Weight 10/09/22 1046 290 lb (131.5 kg)     Height 10/09/22 1046 5\' 8"  (1.727 m)     Head Circumference --      Peak Flow --      Pain Score 10/09/22 1046 7     Pain Loc --      Pain Edu? --      Excl. in GC? --    No data found.  Updated Vital Signs BP 137/83 (BP Location: Right Arm)   Pulse 90   Temp 98.7 F (37.1 C) (Oral)   Resp 15   Ht 5\' 8"  (1.727 m)   Wt 131.5 kg   LMP 07/10/2020   SpO2 96%   BMI 44.09 kg/m   Visual Acuity Right Eye Distance:   Left Eye Distance:   Bilateral Distance:    Right Eye Near:   Left Eye Near:    Bilateral Near:     Physical Exam  GEN: non-toxic appearing female, in no acute distress  CV: regular rate and rhythm RESP: no increased work of breathing, clear to ascultation bilaterally ABD: Bowel sounds present. Soft, RLQ > RUQ and epigastric tenderness, non-distended.  No guarding, no rebound, no appreciable positive McBurney's MSK: no  extremity edema SKIN: warm, dry NEURO: alert, moves all extremities appropriately PSYCH: Normal affect, appropriate speech and behavior   UC Treatments / Results  Labs (all labs ordered are listed, but only abnormal results are displayed) Labs Reviewed - No data to display  EKG  If EKG performed, see my interpretation and MDM section  Radiology No results found.   Procedures Procedures (including critical care time)  Medications Ordered in UC Medications - No data to display  Initial Impression / Assessment and Plan / UC Course  I have reviewed the triage vital signs and the nursing notes.  Pertinent labs & imaging results that were available during my care of the patient were  reviewed by me and considered in my medical decision making (see chart for details).      Patient is a  47 y.o. femalewith history of HIV, kidney stones, GERD, T2DM, hypertension, obesity who is s/p cholecystectomy  presents for insidious moderately severe right lower quadrant severe abdominal pain since waking up this morning.  Patient is taking her HIV medications.  On chart review, she had a CT in November 2018 that showed diffuse hepatic steatosis with enlargement of the liver and she had a tiny stone in the inferior pole of the left kidney that was nonobstructing.  Her last CD4 count is > 900 on 09/03/2022.  Overall, patient is well-appearing, well-hydrated, and in no acute distress.  Vital signs stable.  Chanin is afebrile without recent antipyretics.  She has right lower quadrant, right upper quadrant and epigastric abdominal pain concerning for an acute abdominal process.  I do not appreciate peritoneal signs.  As I am unable to rule out a kidney stone versus acute appendicitis here recommended she be evaluated in the emergency department for advanced imaging of her abdomen.  Patient is agreeable.  She will travel by private vehicle to [facility] emergency department.  Patient stable for discharge.  Called and spoke with triage RN at [facility] ED will await previous arrival.  Discussed MDM, treatment plan and plan for follow-up with patient who agrees with plan.    Final Clinical Impressions(s) / UC Diagnoses   Final diagnoses:  Right lower quadrant abdominal pain     Discharge Instructions      You have been advised to follow up immediately in the emergency department for concerning signs or symptoms as discussed during your visit. If you declined EMS transport, please have a family member take you directly to the ED at this time. Do not delay.   Based on concerns about condition, if you do not follow up in the ED, you may risk poor outcomes including worsening of condition,  delayed treatment and potentially life threatening issues. If you have declined to go to the ED at this time, you should call your PCP immediately to set up a follow up appointment.   Go to ED for red flag symptoms, including; fevers you cannot reduce with Tylenol/Motrin, severe headaches, vision changes, numbness/weakness in part of the body, lethargy, confusion, intractable vomiting, severe dehydration, chest pain, breathing difficulty, severe persistent abdominal or pelvic pain, signs of severe infection (increased redness, swelling of an area), feeling faint or passing out, dizziness, etc. You should especially go to the ED for sudden acute worsening of condition if you do not elect to go at this time.       ED Prescriptions   None    PDMP not reviewed this encounter.   Katha Cabal, DO 10/09/22 1115

## 2022-10-09 NOTE — Discharge Instructions (Signed)

## 2022-10-09 NOTE — ED Notes (Signed)
Patient is being discharged from the Urgent Care and sent to the [facility] Emergency Department via private vehicle . Per Dr. Rachael Darby, patient is in need of higher level of care due to possible Appendicitis. Patient is aware and verbalizes understanding of plan of care.  Vitals:   10/09/22 1048  BP: 137/83  Pulse: 90  Resp: 15  Temp: 98.7 F (37.1 C)  SpO2: 96%

## 2022-10-09 NOTE — Telephone Encounter (Signed)
Patient called in to follow up on this message. Informed patient that message just came through to [place]. She stated that she wanted Dr. Reece Agar to look over her CT scan she had done. Thank you!

## 2022-10-09 NOTE — ED Provider Notes (Signed)
[facility] Emergency Department Provider Note     Event Date/Time   First MD Initiated Contact with Patient 10/09/22 1146     (approximate)   History   Abdominal Pain   HPI  Cassandra [name] is a 47 y.o. female with a history of obesity, HIV, HTN, DM, presents to the ED for evaluation of right lower quadrant abdominal pain.  Patient was onset of symptoms this morning.  She reports referral to pain into her groin.  She does give a remote history of nonobstructive kidney stones.  Presents to the ED from local urgent care for evaluation of acute abdominal symptoms.    Physical Exam   Triage Vital Signs: ED Triage Vitals  Enc Vitals Group     BP 10/09/22 1131 135/81     Pulse Rate 10/09/22 1131 91     Resp 10/09/22 1131 18     Temp 10/09/22 1131 98.6 F (37 C)     Temp Source 10/09/22 1131 Oral     SpO2 10/09/22 1131 96 %     Weight --      Height --      Head Circumference --      Peak Flow --      Pain Score 10/09/22 1130 8     Pain Loc --      Pain Edu? --      Excl. in GC? --     Most recent vital signs: Vitals:   10/09/22 1131  BP: 135/81  Pulse: 91  Resp: 18  Temp: 98.6 F (37 C)  SpO2: 96%    General Awake, no distress. NAD HEENT NCAT. PERRL. EOMI. No rhinorrhea. Mucous membranes are moist.  CV:  Good peripheral perfusion.  RESP:  Normal effort.  ABD:  No distention.  Soft and mildly tender to palpation over the right lower quadrant and suprapubic region.  No CVA tenderness elicited.  No rebound, guarding, or rigidity noted.  No bowel sounds appreciated   ED Results / Procedures / Treatments   Labs (all labs ordered are listed, but only abnormal results are displayed) Labs Reviewed  COMPREHENSIVE METABOLIC PANEL - Abnormal; Notable for the following components:      Result Value   Glucose, Bld 106 (*)    All other components within normal limits  CBC - Abnormal; Notable for the following components:   RDW 11.4 (*)     All other components within normal limits  URINALYSIS, ROUTINE W REFLEX MICROSCOPIC - Abnormal; Notable for the following components:   Color, Urine YELLOW (*)    APPearance CLOUDY (*)    All other components within normal limits  LIPASE, BLOOD    EKG  RADIOLOGY  I personally viewed and evaluated these images as part of my medical decision making, as well as reviewing the written report by the radiologist.  ED Provider Interpretation: no acute findings  CT ABDOMEN PELVIS W CONTRAST  Result Date: 10/09/2022 CLINICAL DATA:  Right lower quadrant pain extending into the groin region with nausea. Acute onset EXAM: CT ABDOMEN AND PELVIS WITH CONTRAST TECHNIQUE: Multidetector CT imaging of the abdomen and pelvis was performed using the standard protocol following bolus administration of intravenous contrast. RADIATION DOSE REDUCTION: This exam was performed according to the departmental dose-optimization program which includes automated exposure control, adjustment of the mA and/or kV according to patient size and/or use of iterative reconstruction technique. CONTRAST:  OMNIPAQUE IOHEXOL 300 MG/ML  SOLN COMPARISON:  CT  04/15/2018 pelvic ultrasound 01/30/2020 FINDINGS: Lower chest: Lung bases are clear.  No pleural effusion. Hepatobiliary: Mild fatty liver infiltration. Patent portal vein. Previous cholecystectomy. No space-occupying liver lesion. Pancreas: Unremarkable. No pancreatic ductal dilatation or surrounding inflammatory changes. Spleen: Normal in size without focal abnormality. Adrenals/Urinary Tract: Right adrenal gland is preserved. The left is slightly thickened but unchanged. Kidneys show several small low-attenuation lesions. Many are under a cm too small to completely characterize. There is a larger focus in the upper aspect of the left kidney with diameter of 4.2 cm and Hounsfield unit of close to 0. No aggressive features. This lesion is larger from previous but again Bosniak 1  and 2 lesions. No specific imaging follow-up. The ureters have normal course and caliber down to the bladder. The bladder is only mildly distended but has a preserved contour. Nonobstructing lower pole left-sided renal stone measuring 4 mm. Stomach/Bowel: On this non oral contrast exam large bowel has a normal course and caliber with scattered stool. Few sigmoid colon diverticula. Normal appendix extends lateral to the cecum in the right lower quadrant. The stomach and small bowel are nondilated. Vascular/Lymphatic: No significant vascular findings are present. No enlarged abdominal or pelvic lymph nodes. Reproductive: Uterus is absent. There is cystic areas seen in the area of the left ovary in the anterior left hemipelvis. Multiple cysts are seen. Largest single focus measures 3.1 by 2.7 cm. Other: No free air or free fluid. Musculoskeletal: Mild degenerative changes seen along the spine and pelvis. IMPRESSION: Nonobstructing lower pole left-sided renal stone. No ureteral stones. Scattered colonic stool. No bowel obstruction, free air or free fluid. Normal appendix. Few colonic diverticula Multiple cystic foci seen in the area of the left ovary in the anterior left hemipelvis with the largest component measuring 3.1 cm. No follow-up imaging recommended in the absence of symptoms. If there is concern further of the ovary with the patient's symptoms a pelvic ultrasound could be performed such as to assess for blood flow to the ovary. Note: This recommendation does not apply to premenarchal patients and to those with increased risk (genetic, family history, elevated tumor markers or other high-risk factors) of ovarian cancer. Reference: JACR 2020 Feb; 17(2):248-254 Electronically Signed   By: Karen Kays M.D.   On: 10/09/2022 13:36     PROCEDURES:  Critical Care performed: No  Procedures   MEDICATIONS ORDERED IN ED: Medications  iohexol (OMNIPAQUE) 300 MG/ML solution 100 mL (100 mLs Intravenous Contrast  Given 10/09/22 1311)     IMPRESSION / MDM / ASSESSMENT AND PLAN / ED COURSE  I reviewed the triage vital signs and the nursing notes.                              Differential diagnosis includes, but is not limited to, ovarian cyst, ovarian torsion, acute appendicitis, diverticulitis, urinary tract infection/pyelonephritis, endometriosis, bowel obstruction, colitis, renal colic, gastroenteritis, hernia, fibroids, endometriosis, pregnancy related pain including ectopic pregnancy, etc.   Patient's presentation is most consistent with acute complicated illness / injury requiring diagnostic workup.  Patient's diagnosis is consistent with right lower quadrant abdominal pain without evidence of acute appendicitis.  Patient with otherwise reassuring exam and workup overall.  She stable on presentation, afebrile with reassuring vital signs.  She is not tachycardic, tachypneic, and has no intractable nausea vomiting.  Nontoxic-appearing.  Labs without acute abnormalities, and CT negative for acute appendicitis or any other significant intra-abdominal process, based on my  interpretation of images. Patient will be discharged home with prescriptions for hydrocodone, and advised to take previously prescribed nausea medicine. Patient is to follow up with her primary provider as needed or otherwise directed. Patient is given ED precautions to return to the ED for any worsening or new symptoms.   FINAL CLINICAL IMPRESSION(S) / ED DIAGNOSES   Final diagnoses:  Right lower quadrant abdominal pain     Rx / DC Orders   ED Discharge Orders          Ordered    HYDROcodone-acetaminophen (NORCO) 5-325 MG tablet  3 times daily PRN        10/09/22 1401             Note:  This document was prepared using Dragon voice recognition software and may include unintentional dictation errors.    Lissa Hoard, PA-C 10/09/22 1422    Sharman Cheek, MD 10/09/22 [phone]

## 2022-10-09 NOTE — Discharge Instructions (Addendum)
Your exam, labs, and CT scan are normal return at time.  No signs of any acute appendicitis.  No other findings other than scattered left-sided kidney stones and a stable left kidney cyst noted.  Take the nausea medicine as needed and pain medicine as needed and follow-up with primary provider for ongoing symptoms.  Return to ED if necessary.

## 2022-10-09 NOTE — ED Triage Notes (Signed)
Patient to ED via [facility] for RLQ abd pain that started this AM. Patient states pain is radiating into groin with nausea.

## 2022-10-09 NOTE — ED Triage Notes (Signed)
Patient c/o RLQ abdominal pain and nausea that started when she woke up this morning.  Patient reports constant stabbing pain.  Patient unsure of fevers.

## 2022-10-12 DIAGNOSIS — N200 Calculus of kidney: Secondary | ICD-10-CM | POA: Diagnosis not present

## 2022-10-12 DIAGNOSIS — M546 Pain in thoracic spine: Secondary | ICD-10-CM | POA: Diagnosis not present

## 2022-10-12 DIAGNOSIS — N281 Cyst of kidney, acquired: Secondary | ICD-10-CM

## 2022-10-12 DIAGNOSIS — K760 Fatty (change of) liver, not elsewhere classified: Secondary | ICD-10-CM | POA: Diagnosis not present

## 2022-10-12 DIAGNOSIS — M9902 Segmental and somatic dysfunction of thoracic region: Secondary | ICD-10-CM | POA: Diagnosis not present

## 2022-10-12 DIAGNOSIS — N83202 Unspecified ovarian cyst, left side: Secondary | ICD-10-CM | POA: Diagnosis not present

## 2022-10-12 DIAGNOSIS — M9901 Segmental and somatic dysfunction of cervical region: Secondary | ICD-10-CM | POA: Diagnosis not present

## 2022-10-12 DIAGNOSIS — M5412 Radiculopathy, cervical region: Secondary | ICD-10-CM | POA: Diagnosis not present

## 2022-10-12 NOTE — Telephone Encounter (Signed)

## 2022-10-14 DIAGNOSIS — M9902 Segmental and somatic dysfunction of thoracic region: Secondary | ICD-10-CM | POA: Diagnosis not present

## 2022-10-14 DIAGNOSIS — M5412 Radiculopathy, cervical region: Secondary | ICD-10-CM | POA: Diagnosis not present

## 2022-10-14 DIAGNOSIS — M9901 Segmental and somatic dysfunction of cervical region: Secondary | ICD-10-CM | POA: Diagnosis not present

## 2022-10-14 DIAGNOSIS — M546 Pain in thoracic spine: Secondary | ICD-10-CM | POA: Diagnosis not present

## 2022-10-20 DIAGNOSIS — M9902 Segmental and somatic dysfunction of thoracic region: Secondary | ICD-10-CM | POA: Diagnosis not present

## 2022-10-20 DIAGNOSIS — M5412 Radiculopathy, cervical region: Secondary | ICD-10-CM | POA: Diagnosis not present

## 2022-10-20 DIAGNOSIS — M9901 Segmental and somatic dysfunction of cervical region: Secondary | ICD-10-CM | POA: Diagnosis not present

## 2022-10-20 DIAGNOSIS — M546 Pain in thoracic spine: Secondary | ICD-10-CM | POA: Diagnosis not present

## 2022-11-03 DIAGNOSIS — M5412 Radiculopathy, cervical region: Secondary | ICD-10-CM | POA: Diagnosis not present

## 2022-11-03 DIAGNOSIS — M9901 Segmental and somatic dysfunction of cervical region: Secondary | ICD-10-CM | POA: Diagnosis not present

## 2022-11-03 DIAGNOSIS — M9902 Segmental and somatic dysfunction of thoracic region: Secondary | ICD-10-CM | POA: Diagnosis not present

## 2022-11-03 DIAGNOSIS — M546 Pain in thoracic spine: Secondary | ICD-10-CM | POA: Diagnosis not present

## 2022-11-05 ENCOUNTER — Encounter: Payer: Self-pay | Admitting: Family Medicine

## 2022-11-06 ENCOUNTER — Encounter: Payer: Self-pay | Admitting: Nurse Practitioner

## 2022-11-06 ENCOUNTER — Ambulatory Visit (INDEPENDENT_AMBULATORY_CARE_PROVIDER_SITE_OTHER): Payer: BC Managed Care – PPO | Admitting: Nurse Practitioner

## 2022-11-06 VITALS — BP 120/86 | HR 97 | Temp 98.5°F | Resp 16 | Ht 68.0 in | Wt 292.8 lb

## 2022-11-06 DIAGNOSIS — S7012XA Contusion of left thigh, initial encounter: Secondary | ICD-10-CM | POA: Diagnosis not present

## 2022-11-06 NOTE — Telephone Encounter (Signed)
Noted and agree with evaluation. 

## 2022-11-06 NOTE — Patient Instructions (Signed)
Contusion A contusion is a deep bruise. This is a result of an injury that causes bleeding under the skin. Symptoms of bruising include pain, swelling, and discolored skin. The skin may turn blue, purple, or yellow. Follow these instructions at home: Managing pain, stiffness, and swelling You may use RICE. This stands for: Resting. Icing. Compression, or putting pressure on the injured area. Elevating, or raising the injured area. To follow this method, do these actions: Rest the injured area. If told, put ice on the injured area. To do this: Put ice in a plastic bag. Place a towel between your skin and the bag. Leave the ice on for 20 minutes, 2-3 times per day. If your skin turns bright red, take off the ice right away to prevent skin damage. The risk of skin damage is higher if you cannot feel pain, heat, or cold. If told, apply compression on the injured area using an elastic bandage. Make sure the bandage is not too tight. If the area tingles or has a loss of feeling (numbness), remove it and put it back on as told by your doctor. If possible, elevate the injured area above the level of your heart while you are sitting or lying down.  General instructions Take over-the-counter and prescription medicines only as told by your doctor. Keep all follow-up visits. Your doctor may want to see how your contusion is healing with treatment. Contact a doctor if: Your symptoms do not get better after several days of treatment. Your symptoms get worse. You have trouble moving the injured area. Get help right away if: You have very bad pain. You have a loss of feeling (numbness) in a hand or foot. Your hand or foot turns pale or cold. This information is not intended to replace advice given to you by your health care provider. Make sure you discuss any questions you have with your health care provider. Document Revised: 11/10/2021 Document Reviewed: 11/10/2021 Elsevier Patient Education  2024  [employer].

## 2022-11-06 NOTE — Progress Notes (Signed)
Acute Office Visit  Subjective:    Patient ID: Cassandra [name], female    DOB: [birth year], 47 y.o.   MRN: [id number]  Chief Complaint  Patient presents with   Bleeding/Bruising    Pt has bruising on left leg on side of knee.  Noticed last night and it's tender to the touch. Denies injury    HPI Patient is in today for left leg bruise. Unsure about cause and how long it has been present. She denies any trauma or leg pain or swelling or redness. No chronic corticosteroid or ASA or NSAID use.   Outpatient Medications Prior to Visit  Medication Sig   Accu-Chek FastClix Lancets MISC 1 each by Does not apply route as directed. Use as directed to check blood sugar twice daily as needed. E11.8   amLODipine (NORVASC) 5 MG tablet Take 1 tablet (5 mg total) by mouth daily.   BIKTARVY 50-200-25 MG TABS tablet Take 1 tablet by mouth at bedtime.   citalopram (CELEXA) 20 MG tablet Take 1 tablet (20 mg total) by mouth daily.   colestipol (COLESTID) 1 g tablet TAKE 2 TABLETS (2 G TOTAL) BY MOUTH 2 (TWO) TIMES DAILY. (Patient taking differently: Take 1 g by mouth daily.)   cyclobenzaprine (FLEXERIL) 5 MG tablet Take 1 tablet (5 mg total) by mouth 2 (two) times daily as needed (headache (sedation precautions)).   diphenhydrAMINE (BENADRYL) 25 MG tablet Take 25 mg by mouth at bedtime as needed for allergies or sleep.   glucose blood (ACCU-CHEK GUIDE) test strip 1 each by Other route as needed for other. Use as instructed to check blood sugar   lisinopril (ZESTRIL) 10 MG tablet Take 1 tablet (10 mg total) by mouth daily.   loratadine (CLARITIN) 10 MG tablet Take 10 mg by mouth daily as needed for allergies.   meloxicam (MOBIC) 15 MG tablet Take 15 mg by mouth daily.   Multiple Vitamin (MULTIVITAMIN ADULT) TABS Take 1 tablet by mouth daily.   Peppermint Oil (IBGARD PO) Take 2 tablets by mouth daily as needed (diarrhea).   Semaglutide, 1 MG/DOSE, (OZEMPIC, 1 MG/DOSE,) 4 MG/3ML SOPN Inject 1 mg as directed  once a week.   No facility-administered medications prior to visit.    Reviewed past medical and social history.  Review of Systems Per HPI     Objective:    Physical Exam Vitals and nursing note reviewed.  Musculoskeletal:     Right upper leg: Normal.     Left upper leg: Normal.     Right knee: Normal.     Left knee: Normal.     Right lower leg: Normal. No edema.     Left lower leg: Normal. No edema.  Skin:    Findings: Bruising present. No erythema or rash.          Comments: No hematoma, no cellulitis, no tenderness  Neurological:     Mental Status: She is alert and oriented to person, place, and time.    BP 120/86 (BP Location: Left Arm, Patient Position: Sitting, Cuff Size: Large)   Pulse 97   Temp 98.5 F (36.9 C) (Temporal)   Resp 16   Ht 5\' 8"  (1.727 m)   Wt 292 lb 12.8 oz (132.8 kg)   LMP 07/10/2020   SpO2 97%   BMI 44.52 kg/m    No results found for any visits on 11/06/22.     Assessment & Plan:   Problem List Items Addressed This Visit   None  Visit Diagnoses     Contusion of left thigh, initial encounter    -  Primary     Simple discrete contusion, no concern about internal bleeding or DVT or cellulitis. Advised to apply cold compress as needed and avoid ASA or NSAIDs  No orders of the defined types were placed in this encounter.  Return if symptoms worsen or fail to improve.    Alysia Penna, NP

## 2022-11-06 NOTE — Telephone Encounter (Signed)
Called patient states first noticed it yesterday. Does not remember any injury. It is left leg behind knee. Patient is not on any blood thinner or Asprin. Denies any redness around or selling in leg. She is having some numbness in foot. 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 I have set patient up for evaluation today at [facility]

## 2022-11-17 DIAGNOSIS — M5412 Radiculopathy, cervical region: Secondary | ICD-10-CM | POA: Diagnosis not present

## 2022-11-17 DIAGNOSIS — M9901 Segmental and somatic dysfunction of cervical region: Secondary | ICD-10-CM | POA: Diagnosis not present

## 2022-11-17 DIAGNOSIS — M9902 Segmental and somatic dysfunction of thoracic region: Secondary | ICD-10-CM | POA: Diagnosis not present

## 2022-11-17 DIAGNOSIS — M546 Pain in thoracic spine: Secondary | ICD-10-CM | POA: Diagnosis not present

## 2022-11-24 DIAGNOSIS — M546 Pain in thoracic spine: Secondary | ICD-10-CM | POA: Diagnosis not present

## 2022-11-24 DIAGNOSIS — M9901 Segmental and somatic dysfunction of cervical region: Secondary | ICD-10-CM | POA: Diagnosis not present

## 2022-11-24 DIAGNOSIS — M9902 Segmental and somatic dysfunction of thoracic region: Secondary | ICD-10-CM | POA: Diagnosis not present

## 2022-11-24 DIAGNOSIS — M5412 Radiculopathy, cervical region: Secondary | ICD-10-CM | POA: Diagnosis not present

## 2022-12-07 DIAGNOSIS — M9902 Segmental and somatic dysfunction of thoracic region: Secondary | ICD-10-CM | POA: Diagnosis not present

## 2022-12-07 DIAGNOSIS — M546 Pain in thoracic spine: Secondary | ICD-10-CM | POA: Diagnosis not present

## 2022-12-07 DIAGNOSIS — M9901 Segmental and somatic dysfunction of cervical region: Secondary | ICD-10-CM | POA: Diagnosis not present

## 2022-12-07 DIAGNOSIS — M5412 Radiculopathy, cervical region: Secondary | ICD-10-CM | POA: Diagnosis not present

## 2023-01-08 ENCOUNTER — Other Ambulatory Visit: Payer: Self-pay | Admitting: Family Medicine

## 2023-01-08 DIAGNOSIS — E1169 Type 2 diabetes mellitus with other specified complication: Secondary | ICD-10-CM

## 2023-01-08 NOTE — Telephone Encounter (Signed)
Ozempic Last filled:  09/19/22, #9 mL Last OV:  05/25/22, 6 mo DM f/u Next OV:  none

## 2023-01-11 NOTE — Telephone Encounter (Signed)
Lvmtcb, sent mychart message  

## 2023-01-11 NOTE — Telephone Encounter (Signed)
Rx refilled. Plz call to schedule CPE as due.

## 2023-01-14 ENCOUNTER — Other Ambulatory Visit: Payer: Self-pay | Admitting: Family Medicine

## 2023-01-14 DIAGNOSIS — F331 Major depressive disorder, recurrent, moderate: Secondary | ICD-10-CM

## 2023-01-14 DIAGNOSIS — I10 Essential (primary) hypertension: Secondary | ICD-10-CM

## 2023-01-14 NOTE — Telephone Encounter (Signed)
E-scribed refills.  Plz schedule CPE and fasting lab (no food/drink- except water and/or blk coffee 5 hrs prior) visits for additional refills.  

## 2023-01-15 NOTE — Telephone Encounter (Signed)
Lvmtcb, sent mychart message  

## 2023-01-28 ENCOUNTER — Other Ambulatory Visit: Payer: Self-pay | Admitting: Medical Genetics

## 2023-01-28 DIAGNOSIS — Z006 Encounter for examination for normal comparison and control in clinical research program: Secondary | ICD-10-CM

## 2023-02-14 ENCOUNTER — Other Ambulatory Visit: Payer: Self-pay | Admitting: Family Medicine

## 2023-02-14 DIAGNOSIS — E538 Deficiency of other specified B group vitamins: Secondary | ICD-10-CM

## 2023-02-14 DIAGNOSIS — E1169 Type 2 diabetes mellitus with other specified complication: Secondary | ICD-10-CM

## 2023-02-15 ENCOUNTER — Other Ambulatory Visit: Payer: BC Managed Care – PPO

## 2023-02-17 ENCOUNTER — Other Ambulatory Visit: Payer: BC Managed Care – PPO

## 2023-02-22 ENCOUNTER — Encounter: Payer: BC Managed Care – PPO | Admitting: Family Medicine

## 2023-04-10 ENCOUNTER — Other Ambulatory Visit: Payer: Self-pay | Admitting: Family Medicine

## 2023-04-10 DIAGNOSIS — I10 Essential (primary) hypertension: Secondary | ICD-10-CM

## 2023-04-11 ENCOUNTER — Other Ambulatory Visit: Payer: Self-pay | Admitting: Family Medicine

## 2023-04-11 DIAGNOSIS — I10 Essential (primary) hypertension: Secondary | ICD-10-CM

## 2023-04-11 DIAGNOSIS — F331 Major depressive disorder, recurrent, moderate: Secondary | ICD-10-CM

## 2023-04-26 ENCOUNTER — Other Ambulatory Visit (INDEPENDENT_AMBULATORY_CARE_PROVIDER_SITE_OTHER): Payer: BC Managed Care – PPO

## 2023-04-26 DIAGNOSIS — E1169 Type 2 diabetes mellitus with other specified complication: Secondary | ICD-10-CM

## 2023-04-26 DIAGNOSIS — E538 Deficiency of other specified B group vitamins: Secondary | ICD-10-CM

## 2023-04-26 LAB — MICROALBUMIN / CREATININE URINE RATIO
Creatinine,U: 183 mg/dL
Microalb Creat Ratio: 0.4 mg/g (ref 0.0–30.0)
Microalb, Ur: <0.7 mg/dL (ref 0.0–1.9)

## 2023-04-26 LAB — HEMOGLOBIN A1C: Hgb A1c MFr Bld: 5.6 % (ref 4.6–6.5)

## 2023-04-26 LAB — COMPREHENSIVE METABOLIC PANEL
ALT: 18 U/L (ref 0–35)
AST: 20 U/L (ref 0–37)
Albumin: 4.2 g/dL (ref 3.5–5.2)
Alkaline Phosphatase: 74 U/L (ref 39–117)
BUN: 12 mg/dL (ref 6–23)
CO2: 30 meq/L (ref 19–32)
Calcium: 9.6 mg/dL (ref 8.4–10.5)
Chloride: 103 meq/L (ref 96–112)
Creatinine, Ser: 1.07 mg/dL (ref 0.40–1.20)
GFR: 62.61 mL/min (ref >60.00)
Glucose, Bld: 102 mg/dL — ABNORMAL HIGH (ref 70–99)
Potassium: 4.4 meq/L (ref 3.5–5.1)
Sodium: 140 meq/L (ref 135–145)
Total Bilirubin: 0.9 mg/dL (ref 0.2–1.2)
Total Protein: 7 g/dL (ref 6.0–8.3)

## 2023-04-26 LAB — LIPID PANEL
Cholesterol: 182 mg/dL (ref 0–200)
HDL: 43.3 mg/dL (ref >39.00)
LDL Cholesterol: 120 mg/dL — ABNORMAL HIGH (ref 0–99)
NonHDL: 138.71
Total CHOL/HDL Ratio: 4
Triglycerides: 92 mg/dL (ref 0.0–149.0)
VLDL: 18.4 mg/dL (ref 0.0–40.0)

## 2023-04-26 LAB — VITAMIN B12: Vitamin B-12: 856 pg/mL (ref 211–911)

## 2023-05-03 ENCOUNTER — Encounter: Payer: Self-pay | Admitting: Family Medicine

## 2023-05-03 ENCOUNTER — Ambulatory Visit (INDEPENDENT_AMBULATORY_CARE_PROVIDER_SITE_OTHER): Payer: BC Managed Care – PPO | Admitting: Family Medicine

## 2023-05-03 VITALS — BP 124/80 | HR 95 | Temp 98.4°F | Ht 67.5 in | Wt 284.4 lb

## 2023-05-03 DIAGNOSIS — Z6841 Body Mass Index (BMI) 40.0 and over, adult: Secondary | ICD-10-CM

## 2023-05-03 DIAGNOSIS — F331 Major depressive disorder, recurrent, moderate: Secondary | ICD-10-CM

## 2023-05-03 DIAGNOSIS — I10 Essential (primary) hypertension: Secondary | ICD-10-CM | POA: Diagnosis not present

## 2023-05-03 DIAGNOSIS — Z21 Asymptomatic human immunodeficiency virus [HIV] infection status: Secondary | ICD-10-CM

## 2023-05-03 DIAGNOSIS — Z87891 Personal history of nicotine dependence: Secondary | ICD-10-CM

## 2023-05-03 DIAGNOSIS — Z23 Encounter for immunization: Secondary | ICD-10-CM | POA: Diagnosis not present

## 2023-05-03 DIAGNOSIS — E538 Deficiency of other specified B group vitamins: Secondary | ICD-10-CM

## 2023-05-03 DIAGNOSIS — E1169 Type 2 diabetes mellitus with other specified complication: Secondary | ICD-10-CM | POA: Diagnosis not present

## 2023-05-03 DIAGNOSIS — N870 Mild cervical dysplasia: Secondary | ICD-10-CM

## 2023-05-03 DIAGNOSIS — Z0000 Encounter for general adult medical examination without abnormal findings: Secondary | ICD-10-CM

## 2023-05-03 DIAGNOSIS — K529 Noninfective gastroenteritis and colitis, unspecified: Secondary | ICD-10-CM

## 2023-05-03 MED ORDER — VITAMIN B-12 1000 MCG PO TABS: 1000.0000 ug | ORAL_TABLET | Freq: Every day | ORAL | Status: DC

## 2023-05-03 MED ORDER — AMLODIPINE BESYLATE 5 MG PO TABS: 5.0000 mg | ORAL_TABLET | Freq: Every day | ORAL | 4 refills | Status: DC

## 2023-05-03 MED ORDER — COLESTIPOL HCL 1 G PO TABS: 2.0000 g | ORAL_TABLET | ORAL | 3 refills | Status: DC

## 2023-05-03 MED ORDER — ESCITALOPRAM OXALATE 20 MG PO TABS: 20.0000 mg | ORAL_TABLET | Freq: Every day | ORAL | 4 refills | Status: DC

## 2023-05-03 MED ORDER — LISINOPRIL 10 MG PO TABS: 10.0000 mg | ORAL_TABLET | Freq: Every day | ORAL | 4 refills | Status: DC

## 2023-05-03 NOTE — Patient Instructions (Addendum)
Tdap today  We will refer you to Clydie Braun our counselor. Stop celexa, start lexapro 20mg  daily.  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  Call and schedule GYN exam.  Return in 3 months for follow up visit

## 2023-05-03 NOTE — Assessment & Plan Note (Addendum)
Continue healthy diet and lifestyle choices for sustainable weight loss  Continue ozempic.  Peak weight 350 lbs

## 2023-05-03 NOTE — Assessment & Plan Note (Signed)
Chronic, stable. Continue current regimen. 

## 2023-05-03 NOTE — Assessment & Plan Note (Addendum)
Continue daily oral replacement

## 2023-05-03 NOTE — Telephone Encounter (Signed)
Filled and in Lisa's box.

## 2023-05-03 NOTE — Progress Notes (Signed)
Ph: [phone] Fax: [phone]   Patient ID: Cassandra [name], female    DOB: 1975/11/14, 47 y.o.   MRN: [id number]  This visit was conducted in person.  BP 124/80   Pulse 95   Temp 98.4 F (36.9 C) (Oral)   Ht 5' 7.45" (1.713 m)   Wt 284 lb 6 oz (129 kg)   LMP 07/10/2020   SpO2 98%   BMI 43.95 kg/m    CC: CPE Subjective:   HPI: Cassandra [name] is a 47 y.o. female presenting on 05/03/2023 for Annual Exam   Sees ID yearly   Continues working from home.  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   Worsening mood issues - since death of close friend 07/24/22 - more difficult time with this. Symptoms are some better. This is despite celexa 20gm daily. Some SI - but no plan. 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  Previously saw Dr Laymond Purser until she retired. Requests new referral.    DM - metformin intolerance. Continues ozempic 1mg  weekly.  HLD - on colestipol about once a week - for loose stools/diarrhea.   Vit B12 deficiency - continues oral replacement daily   Preventative: COLONOSCOPY WITH PROPOFOL 07/04/2018 for chronic diarrhea after cholecystectomy started on colestipol with benefit ([facility]). Thought she has IBS-D. 2 TA, rpt 5 yrs  Well woman with Dr. Vergie Living - s/p hysterectomy 08/2019, pap obtained during hysterectomy CIN1 - rec yearly pap screening for 20 yrs - will call and schedule f/u.  Mammogram 01/2022 Birads1 @ [facility].  Flu yearly COVID vaccine Moderna 05/2020, 06/2020, no booster Tdap 12/2012, update today Pneumovax23 2016  Prevnar-20 08/2022 Seat belt use discussed   Sunscreen use discussed, no changing moles on skin.  Sleep - averaging 6-8 hours/night  Smoker - quit 2017, now intermittent smoking with increased stress Alcohol - rare  Dentist - upcoming 05/2023  Eye exam - yearly  Lives with husband and 2 children, no pets Occupation: call center rep Edu: some college Activity:  walking regularly - recovering after recent foot surgery  Diet: 1 mt dew in am, rest water, fruits/vegetables daily     Relevant past medical, surgical, family and social history reviewed and updated as indicated. Interim medical history since our last visit reviewed. Allergies and medications reviewed and updated. Outpatient Medications Prior to Visit  Medication Sig Dispense Refill   Accu-Chek FastClix Lancets MISC 1 each by Does not apply route as directed. Use as directed to check blood sugar twice daily as needed. E11.8 102 each 3   BIKTARVY 50-200-25 MG TABS tablet Take 1 tablet by mouth at bedtime. 90 tablet 4   cyclobenzaprine (FLEXERIL) 5 MG tablet Take 1 tablet (5 mg total) by mouth 2 (two) times daily as needed (headache (sedation precautions)). 30 tablet 3   diphenhydrAMINE (BENADRYL) 25 MG tablet Take 25 mg by mouth at bedtime as needed for allergies or sleep.     glucose blood (ACCU-CHEK GUIDE) test strip 1 each by Other route as needed for other. Use as instructed to check blood sugar 100 each 0   loratadine (CLARITIN) 10 MG tablet Take 10 mg by mouth daily as needed for allergies.     Multiple Vitamin (MULTIVITAMIN ADULT) TABS Take 1 tablet by mouth daily.     Peppermint Oil (IBGARD PO) Take 2 tablets by mouth daily as needed (diarrhea).     Semaglutide, 1 MG/DOSE, (OZEMPIC, 1 MG/DOSE,) 4 MG/3ML SOPN INJECT  1 MG ONCE A WEEK AS DIRECTED 9 mL 3   amLODipine (NORVASC) 5 MG tablet TAKE 1 TABLET (5 MG TOTAL) BY MOUTH DAILY. 90 tablet 0   citalopram (CELEXA) 20 MG tablet TAKE 1 TABLET BY MOUTH EVERY DAY 90 tablet 0   colestipol (COLESTID) 1 g tablet TAKE 2 TABLETS (2 G TOTAL) BY MOUTH 2 (TWO) TIMES DAILY. (Patient taking differently: Take 1 g by mouth daily.) 360 tablet 0   lisinopril (ZESTRIL) 10 MG tablet TAKE 1 TABLET BY MOUTH EVERY DAY 90 tablet 0   No facility-administered medications prior to visit.     Per HPI unless specifically indicated in ROS section below Review of  Systems  Constitutional:  Negative for activity change, appetite change, chills, fatigue, fever and unexpected weight change.  HENT:  Negative for hearing loss.   Eyes:  Negative for visual disturbance.  Respiratory:  Negative for cough, chest tightness, shortness of breath and wheezing.   Cardiovascular:  Negative for chest pain, palpitations and leg swelling.  Gastrointestinal:  Positive for constipation (occ) and diarrhea (occ). Negative for abdominal distention, abdominal pain, blood in stool, nausea and vomiting.  Genitourinary:  Negative for difficulty urinating and hematuria.  Musculoskeletal:  Negative for arthralgias, myalgias and neck pain.  Skin:  Negative for rash.  Neurological:  Negative for dizziness, seizures, syncope and headaches.  Hematological:  Negative for adenopathy. Does not bruise/bleed easily.  Psychiatric/Behavioral:  Positive for dysphoric mood. Negative for behavioral problems. The patient is nervous/anxious.     Objective:  BP 124/80   Pulse 95   Temp 98.4 F (36.9 C) (Oral)   Ht 5' 7.45" (1.713 m)   Wt 284 lb 6 oz (129 kg)   LMP 07/10/2020   SpO2 98%   BMI 43.95 kg/m   Wt Readings from Last 3 Encounters:  05/03/23 284 lb 6 oz (129 kg)  11/06/22 292 lb 12.8 oz (132.8 kg)  10/09/22 290 lb (131.5 kg)      Physical Exam Vitals and nursing note reviewed.  Constitutional:      Appearance: Normal appearance. She is not ill-appearing.  HENT:     Head: Normocephalic and atraumatic.     Right Ear: Tympanic membrane, ear canal and external ear normal. There is no impacted cerumen.     Left Ear: Tympanic membrane, ear canal and external ear normal. There is no impacted cerumen.     Mouth/Throat:     Mouth: Mucous membranes are moist.     Pharynx: Oropharynx is clear. No oropharyngeal exudate or posterior oropharyngeal erythema.  Eyes:     General:        Right eye: No discharge.        Left eye: No discharge.     Extraocular Movements: Extraocular  movements intact.     Conjunctiva/sclera: Conjunctivae normal.     Pupils: Pupils are equal, round, and reactive to light.  Neck:     Thyroid: No thyroid mass or thyromegaly.  Cardiovascular:     Rate and Rhythm: Normal rate and regular rhythm.     Pulses: Normal pulses.     Heart sounds: Normal heart sounds. No murmur heard. Pulmonary:     Effort: Pulmonary effort is normal. No respiratory distress.     Breath sounds: Normal breath sounds. No wheezing, rhonchi or rales.  Abdominal:     General: Bowel sounds are normal. There is no distension.     Palpations: Abdomen is soft. There is no mass.  Tenderness: There is no abdominal tenderness. There is no guarding or rebound.     Hernia: No hernia is present.  Musculoskeletal:     Cervical back: Normal range of motion and neck supple. No rigidity.     Right lower leg: No edema.     Left lower leg: No edema.  Lymphadenopathy:     Cervical: No cervical adenopathy.  Skin:    General: Skin is warm and dry.     Findings: No rash.  Neurological:     General: No focal deficit present.     Mental Status: She is alert. Mental status is at baseline.  Psychiatric:        Mood and Affect: Mood normal.        Behavior: Behavior normal.       Results for orders placed or performed in visit on 04/26/23  Lipid panel  Result Value Ref Range   Cholesterol 182 0 - 200 mg/dL   Triglycerides 52.8 0.0 - 149.0 mg/dL   HDL 41.32 >44.01 mg/dL   VLDL 02.7 0.0 - 25.3 mg/dL   LDL Cholesterol 664 (H) 0 - 99 mg/dL   Total CHOL/HDL Ratio 4    NonHDL 138.71   Comprehensive metabolic panel  Result Value Ref Range   Sodium 140 135 - 145 mEq/L   Potassium 4.4 3.5 - 5.1 mEq/L   Chloride 103 96 - 112 mEq/L   CO2 30 19 - 32 mEq/L   Glucose, Bld 102 (H) 70 - 99 mg/dL   BUN 12 6 - 23 mg/dL   Creatinine, Ser 4.03 0.40 - 1.20 mg/dL   Total Bilirubin 0.9 0.2 - 1.2 mg/dL   Alkaline Phosphatase 74 39 - 117 U/L   AST 20 0 - 37 U/L   ALT 18 0 - 35 U/L    Total Protein 7.0 6.0 - 8.3 g/dL   Albumin 4.2 3.5 - 5.2 g/dL   GFR 47.42 >59.56 mL/min   Calcium 9.6 8.4 - 10.5 mg/dL  Vitamin L87  Result Value Ref Range   Vitamin B-12 856 211 - 911 pg/mL  Microalbumin / creatinine urine ratio  Result Value Ref Range   Microalb, Ur <0.7 0.0 - 1.9 mg/dL   Creatinine,U 564.3 mg/dL   Microalb Creat Ratio 0.4 0.0 - 30.0 mg/g  Hemoglobin A1c  Result Value Ref Range   Hgb A1c MFr Bld 5.6 4.6 - 6.5 %    Assessment & Plan:   Problem List Items Addressed This Visit     Health maintenance examination - Primary (Chronic)    Preventative protocols reviewed and updated unless pt declined. Discussed healthy diet and lifestyle.       Type 2 diabetes mellitus with other specified complication (HCC)    Chronic, great control on ozempic. Metformin intolerance.       Relevant Medications   lisinopril (ZESTRIL) 10 MG tablet   MDD (major depressive disorder), recurrent episode, moderate (HCC)    Chronic, deteriorated since close friend's passing 07/2022.  Change celexa to lexapro 20mg  daily.  Will refer back to counselor per pt request.  RTC 3 mo closer f/u.      Relevant Medications   escitalopram (LEXAPRO) 20 MG tablet   Other Relevant Orders   Ambulatory referral to Psychology   Body mass index (BMI) of 40.0 to 44.9 in adult [facility])    Continue healthy diet and lifestyle choices for sustainable weight loss  Continue ozempic.  Peak weight 350 lbs  Ex-smoker    Restarted smoking intermittently 1-2 cig/day with increased stress this past year.  Encouraged full cessation.       HIV (human immunodeficiency virus infection) (HCC)    Sees [facility] ID on [facility]      Hypertension    Chronic, stable. Continue current regimen.       Relevant Medications   amLODipine (NORVASC) 5 MG tablet   colestipol (COLESTID) 1 g tablet   lisinopril (ZESTRIL) 10 MG tablet   Chronic diarrhea    Bile salt diarrhea managing with PRN colestipol.       Vitamin  B12 deficiency    Continue daily oral replacement      Dysplasia of cervix, low grade (CIN 1)    H/o this - due for GYN f/u - will call and schedule.       Other Visit Diagnoses     Encounter for immunization       Relevant Orders   Tdap vaccine greater than or equal to 7yo IM (Completed)        Meds ordered this encounter  Medications   amLODipine (NORVASC) 5 MG tablet    Sig: Take 1 tablet (5 mg total) by mouth daily.    Dispense:  90 tablet    Refill:  4   escitalopram (LEXAPRO) 20 MG tablet    Sig: Take 1 tablet (20 mg total) by mouth daily.    Dispense:  90 tablet    Refill:  4    To replace citalopram (Celexa)   cyanocobalamin (VITAMIN B12) 1000 MCG tablet    Sig: Take 1 tablet (1,000 mcg total) by mouth daily.   colestipol (COLESTID) 1 g tablet    Sig: Take 2 tablets (2 g total) by mouth every Monday, Wednesday, and Friday.    Dispense:  90 tablet    Refill:  3   lisinopril (ZESTRIL) 10 MG tablet    Sig: Take 1 tablet (10 mg total) by mouth daily.    Dispense:  90 tablet    Refill:  4    Orders Placed This Encounter  Procedures   Tdap vaccine greater than or equal to 7yo IM   Ambulatory referral to Psychology    Referral Priority:   Routine    Referral Type:   Psychiatric    Referral Reason:   Specialty Services Required    Requested Specialty:   Psychology    Number of Visits Requested:   1    Patient Instructions  Tdap today  We will refer you to Clydie Braun our counselor. Stop celexa, start lexapro 20mg  daily.  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  Call and schedule GYN exam.  Return in 3 months for follow up visit   Follow up plan: Return in about 3 months (around 08/03/2023), or if symptoms worsen or fail to improve, for follow up visit.  Eustaquio Boyden, MD

## 2023-05-03 NOTE — Assessment & Plan Note (Signed)
H/o this - due for GYN f/u - will call and schedule.

## 2023-05-03 NOTE — Assessment & Plan Note (Signed)
Preventative protocols reviewed and updated unless pt declined. Discussed healthy diet and lifestyle.

## 2023-05-03 NOTE — Assessment & Plan Note (Signed)
Bile salt diarrhea managing with PRN colestipol.

## 2023-05-03 NOTE — Assessment & Plan Note (Addendum)
Restarted smoking intermittently 1-2 cig/day with increased stress this past year.  Encouraged full cessation.

## 2023-05-03 NOTE — Assessment & Plan Note (Signed)
Chronic, deteriorated since close friend's passing 07/2022.  Change celexa to lexapro 20mg  daily.  Will refer back to counselor per pt request.  RTC 3 mo closer f/u.

## 2023-05-03 NOTE — Telephone Encounter (Signed)
Mailed form to pt

## 2023-05-03 NOTE — Assessment & Plan Note (Signed)
Sees [facility] ID on [employer]

## 2023-05-03 NOTE — Assessment & Plan Note (Signed)
Chronic, great control on ozempic. Metformin intolerance.

## 2023-05-03 NOTE — Telephone Encounter (Signed)
Printed [employer] form to be completed.   Placed form in Dr Timoteo Expose box.

## 2023-05-17 ENCOUNTER — Ambulatory Visit: Payer: BC Managed Care – PPO | Admitting: Licensed Clinical Social Worker

## 2023-05-20 ENCOUNTER — Ambulatory Visit (INDEPENDENT_AMBULATORY_CARE_PROVIDER_SITE_OTHER): Payer: BC Managed Care – PPO | Admitting: Licensed Clinical Social Worker

## 2023-05-20 DIAGNOSIS — F329 Major depressive disorder, single episode, unspecified: Secondary | ICD-10-CM

## 2023-05-20 DIAGNOSIS — F418 Other specified anxiety disorders: Secondary | ICD-10-CM

## 2023-05-20 DIAGNOSIS — F419 Anxiety disorder, unspecified: Secondary | ICD-10-CM

## 2023-05-20 NOTE — Progress Notes (Signed)
Comprehensive Clinical Assessment (CCA) Note  05/20/2023 Cassandra [name] [id number]  Time Spent: 3:06  pm - 3:58 pm: 52 Minutes  Chief Complaint: No chief complaint on file.  Visit Diagnosis: Anxiety   Guardian/Payee:  Adult/Self    Paperwork requested: No   Reason for Visit /Presenting Problem: Lots of thoughts about death, questioning why is she here,   Mental Status Exam: Appearance:   Fairly Groomed     Behavior:  Appropriate  Motor:  Normal and Restlestness  Speech/Language:   Normal Rate  Affect:  Depressed  Mood:  depressed  Thought process:  normal  Thought content:    WNL  Sensory/Perceptual disturbances:    WNL  Orientation:  oriented to person, place, and time/date  Attention:  Good  Concentration:  Good  Memory:  WNL  Fund of knowledge:   Good  Insight:    Good  Judgment:   Good  Impulse Control:  Good   Reported Symptoms:  sleep disturbances last few weeks on and of again, frustrated easily over silly things, sadness, disengaged  Risk Assessment: Danger to Self:  No Self-injurious Behavior: No Danger to Others: No Duty to Warn:no Physical Aggression / Violence:No  Access to Firearms a concern: No  Gang Involvement:Yes  Patient / guardian was educated about steps to take if suicide or homicide risk level increases between visits: yes While future psychiatric events cannot be accurately predicted, the patient does not currently require acute inpatient psychiatric care and does not currently meet [facility] involuntary commitment criteria.  Substance Abuse History: Current substance abuse: Yes     Caffeine:  Tobacco: Few cigs Alcohol:maybe once every 6 months Substance use:  Past Psychiatric History:   No previous psychological problems have been observed Outpatient Providers:N/A History of Psych Hospitalization: No  Psychological Testing:  None    Abuse History:  Victim of: No., sexual   Report needed: No. Victim of  Neglect:No. Perpetrator of  None   Witness / Exposure to Domestic Violence: No   Protective Services Involvement: No  Witness to [employer] Violence:  No   Family History:  Family History  Problem Relation Age of Onset   Hypertension Mother    Hypertension Father    Appendicitis Father    Atrial fibrillation Father    Diabetes Maternal Grandmother        s/p amputation   Hyperlipidemia Maternal Grandmother    Hypertension Maternal Grandmother    Stroke Maternal Grandmother    Hyperlipidemia Maternal Grandfather    Hypertension Maternal Grandfather    Skin cancer Maternal Grandfather    Diabetes Maternal Grandfather    Seizures Paternal Grandmother 75       deceased   Coronary artery disease Paternal Grandfather 102       MI   Breast cancer Neg Hx    Colon cancer Neg Hx    Colon polyps Neg Hx    Esophageal cancer Neg Hx    Rectal cancer Neg Hx    Stomach cancer Neg Hx     Living situation: the patient lives with their family  Sexual Orientation:  Fluid  Relationship Status: married open marriage last 25 years Name of spouse / other:Cecil and female companion for husband and self  If a parent, number of children / ages:15 son at home, adult son   Support Systems: spouse friends  Surveyor, quantity Stress:  Yes   Income/Employment/Disability: Employment  Financial planner: No   Educational History: Education: some college working on [employer] currently  Religion/Sprituality/World View: Spiritual  Any cultural differences that may affect / interfere with treatment:  None  Recreation/Hobbies: reading, scouting, genealogy, music    Stressors: Educational concerns  , Surveyor, quantity Concerns, Health   Strengths: Supportive Relationships and Friends  Barriers:  None   Legal History: Pending legal issue / charges: The patient has no significant history of legal issues. History of legal issue / charges:  None  Medical History/Surgical History: not reviewed Past Medical History:   Diagnosis Date   Allergy    Allergy to animal dander    cats and dogs   Anxiety    Arthritis    Back   Complication of anesthesia    Pt does not want an epidural, have had reactions to that medication   Constipation    alternating from constipation to diarrhea   COVID-19 07/12/2020   Depression    Diabetes mellitus without complication (HCC)    Type II   Gallstones 06/2016   by xray - surgery to remove   Generalized headaches    frequent   GERD (gastroesophageal reflux disease)    History of abnormal Pap smear    remote   History of gestational diabetes    first 2 pregnancies   History of kidney stones    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HCC)    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    Hypertension    IBS (irritable bowel syndrome)    Morbid obesity (HCC)    Periodontal disease 08/2011   currently getting dental work   Tobacco use     Past Surgical History:  Procedure Laterality Date   BIOPSY  07/04/2018   Procedure: BIOPSY;  Surgeon: Tressia Danas, MD;  Location: [facility];  Service: Gastroenterology;;   CESAREAN SECTION  [phone]   x2   CHOLECYSTECTOMY  2018   COLONOSCOPY WITH PROPOFOL N/A 07/04/2018   for chronic diarrhea after cholecystectomy started on colestipol with benefit ([facility]). Thought she has IBS-D. 2 TA, rpt 5 yrs   CYSTOSCOPY N/A 08/20/2020   Procedure: CYSTOSCOPY;  Surgeon: Redan Bing, MD;  Location: [facility] OR;  Service: Gynecology;  Laterality: N/A;   ENDOMETRIAL BIOPSY  01/25/2020       HYSTERECTOMY ABDOMINAL WITH SALPINGECTOMY Bilateral 08/20/2020   done for heavy bleed and pelvic pain - pathology showed CIN1 of cervix Vergie Living, Billey Gosling, MD)   HYSTEROSCOPY WITH D & C N/A 01/05/2018   [facility]a for heavy bleeding, IUD removed - DILATATION AND CURETTAGE /HYSTEROSCOPY WITH MINERVA ABLATION; Reva Bores, MD   INTRAUTERINE DEVICE INSERTION     Mirena   POLYPECTOMY  07/04/2018   Procedure: POLYPECTOMY;  Surgeon: Tressia Danas, MD;   Location: [facility];  Service: Gastroenterology;;   TOE SURGERY Left 2022   TOTAL LAPAROSCOPIC HYSTERECTOMY WITH SALPINGECTOMY Bilateral 08/20/2020   Procedure: ATTEMPTED TOTAL LAPAROSCOPIC HYSTERECTOMY WITH SALPINGECTOMY;  Surgeon: Fortuna Bing, MD;  Location[facility];  Service: Gynecology;  Laterality: Bilateral;   TUBAL LIGATION     WISDOM TOOTH EXTRACTION     x 4    Medications: Current Outpatient Medications  Medication Sig Dispense Refill   Accu-Chek FastClix Lancets MISC 1 each by Does not apply route as directed. Use as directed to check blood sugar twice daily as needed. E11.8 102 each 3   amLODipine (NORVASC) 5 MG tablet Take 1 tablet (5 mg total) by mouth daily. 90 tablet 4   BIKTARVY 50-200-25 MG TABS tablet Take 1 tablet by mouth at bedtime. 90 tablet 4  colestipol (COLESTID) 1 g tablet Take 2 tablets (2 g total) by mouth every Monday, Wednesday, and Friday. 90 tablet 3   cyanocobalamin (VITAMIN B12) 1000 MCG tablet Take 1 tablet (1,000 mcg total) by mouth daily.     cyclobenzaprine (FLEXERIL) 5 MG tablet Take 1 tablet (5 mg total) by mouth 2 (two) times daily as needed (headache (sedation precautions)). 30 tablet 3   diphenhydrAMINE (BENADRYL) 25 MG tablet Take 25 mg by mouth at bedtime as needed for allergies or sleep.     escitalopram (LEXAPRO) 20 MG tablet Take 1 tablet (20 mg total) by mouth daily. 90 tablet 4   glucose blood (ACCU-CHEK GUIDE) test strip 1 each by Other route as needed for other. Use as instructed to check blood sugar 100 each 0   lisinopril (ZESTRIL) 10 MG tablet Take 1 tablet (10 mg total) by mouth daily. 90 tablet 4   loratadine (CLARITIN) 10 MG tablet Take 10 mg by mouth daily as needed for allergies.     Multiple Vitamin (MULTIVITAMIN ADULT) TABS Take 1 tablet by mouth daily.     Peppermint Oil (IBGARD PO) Take 2 tablets by mouth daily as needed (diarrhea).     Semaglutide, 1 MG/DOSE, (OZEMPIC, 1 MG/DOSE,) 4 MG/3ML SOPN INJECT 1 MG ONCE A WEEK AS  DIRECTED 9 mL 3   No current facility-administered medications for this visit.    Allergies  Allergen Reactions   Metformin And Related Diarrhea   Nickel Rash    Diagnoses:  Anxiety and Depression   Psychiatric Treatment: No , None  Plan of Care: Virtual Sessions weekly  Narrative:   AMMARA CRAUN participated from home, via video, is aware of tele-sessions limitations, and consented to treatment. Therapist participated from home office. We reviewed the limits of confidentiality prior to the start of the evaluation. Celine Mans expressed understanding and agreement to proceed. Patient has shared that the recent loss of a lover back in February has caused some spaces of grief but she is ready to work on other areas at this time in managing emotions.  Has been feeling down and wondering if she should be here some days but has shared her son needs her to be here so that is her motivation to keep going.    A follow-up was scheduled to create a treatment plan and begin treatment. Therapist answered all questions during the evaluation and contact information was provided. Patient agreed to obtain a journal for next session to begin the work together.     Anselmo Pickler, [facility]

## 2023-05-27 ENCOUNTER — Ambulatory Visit (INDEPENDENT_AMBULATORY_CARE_PROVIDER_SITE_OTHER): Payer: BC Managed Care – PPO | Admitting: Licensed Clinical Social Worker

## 2023-05-27 DIAGNOSIS — F331 Major depressive disorder, recurrent, moderate: Secondary | ICD-10-CM

## 2023-05-27 DIAGNOSIS — F419 Anxiety disorder, unspecified: Secondary | ICD-10-CM | POA: Diagnosis not present

## 2023-05-27 NOTE — Progress Notes (Addendum)
[facility] Counselor/Therapist Progress Note  Patient ID: Cassandra [name], MRN: [id number]   Date: 05/27/23  Time Spent: 11:05  am - 12:06 pm : 61 Minutes  Treatment Type: Individual Therapy.  Reported Symptoms: sadness. Sleep disturbances. Overall flat/sad mood  Mental Status Exam: Appearance:  Casual     Behavior: Care-Taking  Motor: Normal  Speech/Language:  Clear and Coherent  Affect: Flat  Mood: dysthymic  Thought process: concrete  Thought content:   WNL  Sensory/Perceptual disturbances:   WNL  Orientation: oriented to person, place, and time/date  Attention: Good  Concentration: Good  Memory: WNL  Fund of knowledge:  Good  Insight:   Good  Judgment:  Good  Impulse Control: Good   Risk Assessment: Danger to Self:  No Self-injurious Behavior: No Danger to Others: No Duty to Warn:no Physical Aggression / Violence:No  Access to Firearms a concern: No  Gang Involvement:No   Subjective:   Cassandra [name] participated from home, via video and consented to treatment. Therapist participated from home office. I discussed the limitations of evaluation and management by telemedicine and the availability of in person appointments. The patient expressed understanding and agreed to proceed. Cassandra [name] reviewed the events of the past week.     We reviewed numerous treatment approaches including CBT and Solution focused therapy. Psych-education regarding the [name] diagnosis of No diagnosis found. was provided during the session. We discussed Cassandra [name]'s treatment goals which include identifying and verbalizing needs/wants around fears, anxiety and concerns for self. This goal attends to self discovery, self awareness and self esteem.  Cassandra [name] provided verbal approval of the treatment plan.   Interventions: Psycho-education & Goal Setting.   Diagnosis:  Anxiety/MDD  Psychiatric Treatment: No , N/A   Treatment Plan:  Client  Abilities/Strengths Cassandra [name] is able to put herself in a position to make her life function without excess.  Resilient and strong in ability to take care of herself.    Support System: Does not currently feel like she has the support to be taken care of  Client Treatment Preferences Virtual  Treatment Level Weekly  Symptoms  fear   (Status: declined) sadness   (Status: declined)  Goals:   Cassandra [name] agreed to set goal of treatment goals which include identifying and verbalizing needs/wants around fears, anxiety and concerns for self. This goal attends to self discovery, self awareness and self esteem.  Cassandra [name] provided verbal approval of the treatment plan.   Treatment plan signed and available on s-drive:  Yes    Target Date: 07/09/23 Frequency: Weekly  Progress: 0 Modality: individual    Therapist will provide referrals for additional resources as appropriate.  Therapist will provide psycho-education regarding Terra's diagnosis and corresponding treatment approaches and interventions. Licensed Clinical Mental Health Counselor, Anselmo Pickler, [facility] will support the patient's ability to achieve the goals identified. will employ CBT, BA, Problem-solving, Solution Focused, Mindfulness,  coping skills, & other evidenced-based practices will be used to promote progress towards healthy functioning to help manage decrease symptoms associated with her diagnosis.   Reduce overall level, frequency, and intensity of the feelings of depression, anxiety and fear evidenced by decreased negative thoughts and crippling fear of launching.   Verbally express understanding of the relationship between feelings of depression, anxiety and their impact on thinking patterns and behaviors. Verbalize an understanding of the role that distorted thinking plays in creating fears, excessive worry, and ruminations.    Cassandra [name] participated in the creation of the treatment  plan)   Anselmo Pickler, [facility]

## 2023-06-11 ENCOUNTER — Other Ambulatory Visit: Payer: Self-pay | Admitting: Family Medicine

## 2023-06-12 NOTE — Telephone Encounter (Signed)
 ERx

## 2023-06-17 ENCOUNTER — Ambulatory Visit (INDEPENDENT_AMBULATORY_CARE_PROVIDER_SITE_OTHER): Payer: BC Managed Care – PPO | Admitting: Licensed Clinical Social Worker

## 2023-06-17 DIAGNOSIS — F419 Anxiety disorder, unspecified: Secondary | ICD-10-CM

## 2023-06-17 DIAGNOSIS — F331 Major depressive disorder, recurrent, moderate: Secondary | ICD-10-CM

## 2023-06-17 NOTE — Progress Notes (Signed)
    [facility] Counselor/Therapist Progress Note  Patient ID: Cassandra [name], MRN: [id number]    Date: 06/17/23  Time Spent: 12:10  pm - 1:10 pm : 60 Minutes  Treatment Type: Individual Therapy.  Reported Symptoms: Feeling of concern about finances but finding the ability to regulate more palatable   Mental Status Exam: Appearance:  Well Groomed     Behavior: Appropriate and Sharing  Motor: Normal  Speech/Language:  Clear and Coherent  Affect: Appropriate  Mood: normal  Thought process: normal  Thought content:   WNL  Sensory/Perceptual disturbances:   WNL  Orientation: oriented to person, place, and time/date  Attention: Good  Concentration: Good  Memory: WNL  Fund of knowledge:  Good  Insight:   Good  Judgment:  Good  Impulse Control: Good   Risk Assessment: Danger to Self:  No Self-injurious Behavior: No Danger to Others: No Duty to Warn:no Physical Aggression / Violence:No  Access to Firearms a concern: No  Gang Involvement:No   Subjective:   Cassandra [name] participated from home, via video, and consented to treatment. I discussed the limitations of evaluation and management by telemedicine and the availability of in person appointments. The patient expressed understanding and agreed to proceed.  Therapist participated from home office.  [name] reviewed the events of the past week.      Interventions: Narrative  Diagnosis:  Major depressive disorder, recurrent episode, moderate (HCC)  Anxiety  Psychiatric Treatment: No , None  Treatment Plan:  Client Abilities/Strengths Cassandra [name] open, receptive and sounds/responds in a healthy mental space within sessions.    Support System: Family and Passenger Transport Manager of Needs Cassandra [name] would like to continue to work on self development in order to manage depression and anxiety.   We are exploring new spaces and  challenging to relinquish control.  She is open to taking a train ride to [place] to explore.  Possibly 07/03/23  Treatment Level Weekly  Symptoms  Sadness   (Status: declined) She was able to maintain by utilizing her tool/strategies for mood enhancement.  Fear   (Status: maintained)  Goals:   Cassandra [name] goal of treatment  includes identifying and verbalizing needs/wants around fears, anxiety and concerns for self. This goal attends to self discovery, self awareness and self esteem.  Cassandra [name] provided verbal approval of the treatment plan.    Target Date: 07/09/23 Frequency: Weekly  Progress: 0 Modality: individual    Therapist will provide referrals for additional resources as appropriate.  Therapist will provide psycho-education regarding Cassandra [name]'s diagnosis and corresponding treatment approaches and interventions. Licensed Clinical Mental Health Counselor, Tawni Louder, [facility] will support the patient's ability to achieve the goals identified. will employ CBT, BA, Problem-solving, Solution Focused, Mindfulness,  coping skills, & other evidenced-based practices will be used to promote progress towards healthy functioning to help manage decrease symptoms associated with her diagnosis.   Reduce overall level, frequency, and intensity of the feelings of depression, anxiety and panic evidenced by increased overall mood by finding value and joy.  Plans to journal at least 1x per week and utilize the emotion wheel.   Verbally express understanding of the relationship between feelings of depression, anxiety and their impact on thinking patterns and behaviors. Verbalize an understanding of the role that distorted thinking plays in creating fears, excessive worry, and ruminations.  Cassandra [name] participated in the creation of the treatment plan)    Jenaro Souder, LCMHC

## 2023-06-24 ENCOUNTER — Ambulatory Visit: Payer: BC Managed Care – PPO | Admitting: Licensed Clinical Social Worker

## 2023-06-24 DIAGNOSIS — F331 Major depressive disorder, recurrent, moderate: Secondary | ICD-10-CM | POA: Diagnosis not present

## 2023-06-24 DIAGNOSIS — F419 Anxiety disorder, unspecified: Secondary | ICD-10-CM | POA: Diagnosis not present

## 2023-06-24 NOTE — Progress Notes (Signed)
[facility] Counselor/Therapist Progress Note  Patient ID: MARIEANNE RUEHLE, MRN: [id number]    Date: 06/24/23  Time Spent: 10:09  am - 11:03 am : 53 Minutes  Treatment Type: Individual Therapy.  Reported Symptoms: feeling proud of self and accomplished with winning a merit award, nervous and anxious about family get together  Mental Status Exam: Appearance:  Casual     Behavior: Appropriate and Sharing  Motor: Normal  Speech/Language:  Normal Rate  Affect: Appropriate  Mood: normal  Thought process: normal  Thought content:   WNL  Sensory/Perceptual disturbances:   WNL  Orientation: oriented to person, place, and time/date  Attention: Good  Concentration: Good  Memory: WNL  Fund of knowledge:  Good  Insight:   Good  Judgment:  Good  Impulse Control: Good   Risk Assessment: Danger to Self:  No Self-injurious Behavior: No Danger to Others: No Duty to Warn:no Physical Aggression / Violence:No  Access to Firearms a concern: No  Gang Involvement:No   Subjective:   Celine Mans participated from home, via video, and consented to treatment. I discussed the limitations of evaluation and management by telemedicine and the availability of in person appointments. The patient expressed understanding and agreed to proceed.  Therapist participated from home office.  Lanasia reviewed the events of the past week. 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 of her [place].       Interventions: Narrative  Diagnosis:  MDD, Anxiety  Psychiatric Treatment: No , None  Treatment Plan:  Client Abilities/Strengths Retaj maintains her motivation  Support System: Scout, sessions, and immediate family(partners)  Merchant navy officer of Needs Kindal would like to work on getting her truck repaired to gain a sense of freedom and apply for the new scout job.      Treatment Level Weekly  Symptoms  Anxious   (Status: maintained) Worried   (Status: maintained)  Goals:   Anniah experiences symptoms of anxiety about an upcoming outing with her mom, estranged aunt, cousin and son.  Mom's behavior can be unpredictable and unsettling.     Target Date: 07/09/23 Frequency: Weekly  Progress: 0 Modality: individual    Therapist will provide referrals for additional resources as appropriate.  Therapist will provide psycho-education regarding Jamine's diagnosis and corresponding treatment approaches and interventions. Licensed Clinical Mental Health Counselor, Anselmo Pickler, [facility] will support the patient's ability to achieve the goals identified. will employ CBT, BA, Problem-solving, Solution Focused, Mindfulness,  coping skills, & other evidenced-based practices will be used to promote progress towards healthy functioning to help manage decrease symptoms associated with her diagnosis.   Reduce overall level, frequency, and intensity of the feelings of depression, anxiety and panic evidenced increased self awareness and decreased  fear and anxiety. Verbally express understanding of the relationship between feelings of depression, anxiety and their impact on thinking patterns and behaviors. Verbalize an understanding of the role that distorted thinking plays in creating fears, excessive worry, and ruminations.  Toni Amend participated in the creation of the treatment plan)    Anselmo Pickler, [facility]

## 2023-07-05 ENCOUNTER — Ambulatory Visit: Payer: BC Managed Care – PPO | Admitting: Licensed Clinical Social Worker

## 2023-07-13 ENCOUNTER — Ambulatory Visit: Payer: BC Managed Care – PPO | Admitting: Licensed Clinical Social Worker

## 2023-07-13 DIAGNOSIS — F331 Major depressive disorder, recurrent, moderate: Secondary | ICD-10-CM

## 2023-07-13 DIAGNOSIS — F419 Anxiety disorder, unspecified: Secondary | ICD-10-CM | POA: Diagnosis not present

## 2023-07-13 NOTE — Progress Notes (Signed)
 [facility] Counselor/Therapist Progress Note  Patient ID: Cassandra [name], MRN: [id number]    Date: 07/13/23  Time Spent: 2:06  pm - 3:01 pm : 55 Minutes  Treatment Type: Individual Therapy.  Reported Symptoms: feeling down, birthday coming but focused on lack of funds and discord with spouse about money  Mental Status Exam: Appearance:  Casual     Behavior: Appropriate and Sharing  Motor: Normal  Speech/Language:  Clear and Coherent  Affect: Appropriate  Mood: normal  Thought process: normal  Thought content:   WNL  Sensory/Perceptual disturbances:   WNL  Orientation: oriented to person, place, and time/date  Attention: Good  Concentration: Good  Memory: WNL  Fund of knowledge:  Good  Insight:   Good  Judgment:  Good  Impulse Control: Good   Risk Assessment: Danger to Self:  No Self-injurious Behavior: No Danger to Others: No Duty to Warn:no Physical Aggression / Violence:No  Access to Firearms a concern: No  Gang Involvement:No   Subjective:   Cassandra [name] participated from home, via video, and consented to treatment. I discussed the limitations of evaluation and management by telemedicine and the availability of in person appointments. The patient expressed understanding and agreed to proceed.  Therapist participated from home office.  Cassandra [name] reviewed the events of the past week.      Interventions: Cognitive Behavioral Therapy and Solution-Oriented/Positive Psychology  Diagnosis:  MDD and Anxiety  Psychiatric Treatment: No , N/A  Treatment Plan:  Client Abilities/Strengths Cassandra [name] highly motivated to   Support System: Family, Friends, Facilities Manager, Sessions  Merchant [employer] Officer of Needs Cassandra [name] would like to focus on her trust issues   Treatment Level Weekly  Symptoms  Worried   (Status: declined) Anxious   (Status: declined)  Goals:   Cassandra [name] experiences symptoms of worry and  anxiety that has increased with concerns over finances.     Target Date: 07/13/23 Frequency: Weekly  Progress: 65% Modality: individual    Therapist will provide referrals for additional resources as appropriate.  Therapist will provide psycho-education regarding Cassandra [name]'s diagnosis and corresponding treatment approaches and interventions. Licensed Clinical Mental Health Counselor, Tawni Louder, [facility] will support the patient's ability to achieve the goals identified. will employ CBT, BA, Problem-solving, Solution Focused, Mindfulness,  coping skills, & other evidenced-based practices will be used to promote progress towards healthy functioning to help manage decrease symptoms associated with her diagnosis.   Reduce overall level, frequency, and intensity of the feelings of depression, anxiety and panic evidenced by decreased anxiety and worry mainly due to finances and lack of support from spouse.   Verbally express understanding of the relationship between feelings of depression, anxiety and their impact on thinking patterns and behaviors. Verbalize an understanding of the role that distorted thinking plays in creating fears, excessive worry, and ruminations.  Cassandra [name] participated in the creation of the treatment plan)     Cassandra [name], LCMHC

## 2023-07-20 ENCOUNTER — Ambulatory Visit: Payer: BC Managed Care – PPO | Admitting: Licensed Clinical Social Worker

## 2023-07-26 ENCOUNTER — Ambulatory Visit: Payer: BC Managed Care – PPO | Admitting: Licensed Clinical Social Worker

## 2023-07-26 DIAGNOSIS — F419 Anxiety disorder, unspecified: Secondary | ICD-10-CM

## 2023-07-26 DIAGNOSIS — F331 Major depressive disorder, recurrent, moderate: Secondary | ICD-10-CM | POA: Diagnosis not present

## 2023-07-26 NOTE — Progress Notes (Signed)
   [facility] Counselor/Therapist Progress Note  Patient ID: Cassandra [name], MRN: [id number]    Date: 07/26/23  Time Spent: 9:02  am - 9:57 am : 55 Minutes  Treatment Type: Individual Therapy.  Reported Symptoms: scared and anxious about recent write up at work  Mental Status Exam: Appearance:  Casual     Behavior: Sharing and Rationalizing  Motor: Restlestness  Speech/Language:  Clear and Coherent  Affect: Appropriate  Mood: normal  Thought process: normal  Thought content:   WNL  Sensory/Perceptual disturbances:   WNL  Orientation: oriented to person, place, and time/date  Attention: Good  Concentration: Good  Memory: WNL  Fund of knowledge:  Good  Insight:   Good  Judgment:  Good  Impulse Control: Good   Risk Assessment: Danger to Self:  No Self-injurious Behavior: No Danger to Others: No Duty to Warn:no Physical Aggression / Violence:No  Access to Firearms a concern: No  Gang Involvement:No   Subjective:   Celine Mans participated from home, via video, and consented to treatment. I discussed the limitations of evaluation and management by telemedicine and the availability of in person appointments. The patient expressed understanding and agreed to proceed.  Therapist participated from home office.  Zaylah reviewed the events of the past week.      Interventions: Solution-Oriented/Positive Psychology  Diagnosis:  MDD and Anxiety   Psychiatric Treatment: No , N/A  Treatment Plan:  Client Abilities/Strengths Chella takes ownerships and responsibilities for actions and is processing behaviors/reflective.    Support System: Scouts, Family  Merchant navy officer of Needs Laketia would like to consider a new job option as she has recently been written up about her performance and attendance.  Feels threatened from the current job.     Treatment Level Weekly  Symptoms  Fearful   (Status:  declined) Anxious   (Status: declined)  Goals:   Eleri will learn to manage negative self talk and fear in order to be more present and connected with society and more comfortable to recognize what she can control.  This aides in more informed decision making to boost confidence.    Target Date: 09/06/23 Frequency: Weekly  Progress: 0 Modality: individual    Therapist will provide referrals for additional resources as appropriate.  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 Licensed Clinical Mental Health Counselor, Anselmo Pickler, [facility] will support the patient's ability to achieve the goals identified. will employ CBT, BA, Problem-solving, Solution Focused, Mindfulness,  coping skills, & other evidenced-based practices will be used to promote progress towards healthy functioning to help manage decrease symptoms associated with her diagnosis.   Reduce overall level, frequency, and intensity of the feelings of depression, anxiety and panic evidenced by decreased negative self talk and confidence.   Verbally express understanding of the relationship between feelings of depression, anxiety and their impact on thinking patterns and behaviors. Verbalize an understanding of the role that distorted thinking plays in creating fears, excessive worry, and ruminations.  Toni Amend participated in the creation of the treatment plan)   Anselmo Pickler, [facility]

## 2023-08-03 ENCOUNTER — Encounter: Payer: Self-pay | Admitting: Family Medicine

## 2023-08-03 ENCOUNTER — Ambulatory Visit (INDEPENDENT_AMBULATORY_CARE_PROVIDER_SITE_OTHER): Payer: BC Managed Care – PPO | Admitting: Family Medicine

## 2023-08-03 VITALS — BP 126/84 | HR 95 | Temp 98.0°F | Ht 67.5 in | Wt 286.1 lb

## 2023-08-03 DIAGNOSIS — F331 Major depressive disorder, recurrent, moderate: Secondary | ICD-10-CM

## 2023-08-03 MED ORDER — BUPROPION HCL ER (SR) 100 MG PO TB12: 100.0000 mg | ORAL_TABLET | Freq: Every day | ORAL | 3 refills | Status: DC

## 2023-08-03 NOTE — Progress Notes (Signed)
 Ph: [phone] Fax: [phone]   Patient ID: Cassandra [name], female    DOB: 19-Feb-1976, 48 y.o.   MRN: [id number]  This visit was conducted in person.  BP 126/84   Pulse 95   Temp 98 F (36.7 C) (Oral)   Ht 5' 7.5" (1.715 m)   Wt 286 lb 2 oz (129.8 kg)   LMP 07/10/2020   SpO2 96%   BMI 44.15 kg/m    CC: 3 mo mood f/u visit  Subjective:   HPI: Cassandra [name] is a 48 y.o. female presenting on 08/03/2023 for Medical Management of Chronic Issues (Here for 3 mo mood f/u.)   Sees ID yearly on [employer].  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   See prior note for details. Mood deteriorated since death of close friend Jul 28, 2022. She was on celexa 20mg  daily - last visit we transitioned to lexapro 20mg  daily and referred to counselor. Seeing counselor every 1-2 weeks. This has helped. Overall doing better.  Predominant trouble now in focusing/concentration.  Notes more difficulty at work due to this.  Continues working from home - more trouble multitasking.   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     08/03/2023    9:31 AM 05/03/2023   11:11 AM 09/03/2022    9:20 AM 11/21/2021    9:21 AM 09/02/2021   10:05 AM  Depression screen PHQ 2/9  Decreased Interest 0 1 0 1 0  Down, Depressed, Hopeless 0 1 1 1  0  PHQ - 2 Score 0 2 1 2  0  Altered sleeping 0 2  1   Tired, decreased energy 1 2  1    Change in appetite 1 2  1    Feeling bad or failure about yourself  1 1  1    Trouble concentrating 2 3  2    Moving slowly or fidgety/restless 0 2  0   Suicidal thoughts 0 2  0   PHQ-9 Score 5 16  8    Difficult doing work/chores Somewhat difficult Very difficult  Somewhat difficult        08/03/2023    9:31 AM 05/03/2023   11:11 AM 11/21/2021    9:22 AM 11/19/2020    4:14 PM  GAD 7 : Generalized Anxiety Score  Nervous, Anxious, on Edge 1 1 1 1   Control/stop  worrying 1 2 1 1   Worry too much - different things 1 2 1  0  Trouble relaxing 1 2 1  0  Restless 1 2 1  0  Easily annoyed or irritable  1 1 1   Afraid - awful might happen 1 1 1  0  Total GAD 7 Score  11 7 3   Anxiety Difficulty Somewhat difficult Somewhat difficult Somewhat difficult        Relevant past medical, surgical, family and social history reviewed and updated as indicated. Interim medical history since our last visit reviewed. Allergies and medications reviewed and updated. Outpatient Medications Prior to Visit  Medication Sig Dispense Refill   Accu-Chek FastClix Lancets MISC 1 each by Does not apply route as directed. Use as directed to check blood sugar twice daily as needed. E11.8 102 each 3   amLODipine (NORVASC) 5 MG tablet Take 1 tablet (5 mg total) by mouth daily. 90 tablet 4   BIKTARVY 50-200-25 MG TABS tablet Take 1 tablet by mouth at bedtime. 90 tablet 4   colestipol (  COLESTID) 1 g tablet TAKE 2 TABLETS (2 G TOTAL) BY MOUTH EVERY MONDAY, WEDNESDAY, AND FRIDAY. 270 tablet 0   cyanocobalamin (VITAMIN B12) 1000 MCG tablet Take 1 tablet (1,000 mcg total) by mouth daily.     cyclobenzaprine (FLEXERIL) 5 MG tablet Take 1 tablet (5 mg total) by mouth 2 (two) times daily as needed (headache (sedation precautions)). 30 tablet 3   diphenhydrAMINE (BENADRYL) 25 MG tablet Take 25 mg by mouth at bedtime as needed for allergies or sleep.     escitalopram (LEXAPRO) 20 MG tablet Take 1 tablet (20 mg total) by mouth daily. 90 tablet 4   glucose blood (ACCU-CHEK GUIDE) test strip 1 each by Other route as needed for other. Use as instructed to check blood sugar 100 each 0   lisinopril (ZESTRIL) 10 MG tablet Take 1 tablet (10 mg total) by mouth daily. 90 tablet 4   loratadine (CLARITIN) 10 MG tablet Take 10 mg by mouth daily as needed for allergies.     Multiple Vitamin (MULTIVITAMIN ADULT) TABS Take 1 tablet by mouth daily.     Peppermint Oil (IBGARD PO) Take 2 tablets by mouth daily as  needed (diarrhea).     Semaglutide, 1 MG/DOSE, (OZEMPIC, 1 MG/DOSE,) 4 MG/3ML SOPN INJECT 1 MG ONCE A WEEK AS DIRECTED 9 mL 3   No facility-administered medications prior to visit.     Per HPI unless specifically indicated in ROS section below Review of Systems  Objective:  BP 126/84   Pulse 95   Temp 98 F (36.7 C) (Oral)   Ht 5' 7.5" (1.715 m)   Wt 286 lb 2 oz (129.8 kg)   LMP 07/10/2020   SpO2 96%   BMI 44.15 kg/m   Wt Readings from Last 3 Encounters:  08/03/23 286 lb 2 oz (129.8 kg)  05/03/23 284 lb 6 oz (129 kg)  11/06/22 292 lb 12.8 oz (132.8 kg)      Physical Exam Vitals and nursing note reviewed.  Constitutional:      Appearance: Normal appearance. She is not ill-appearing.  Neurological:     Mental Status: She is alert.  Psychiatric:        Mood and Affect: Mood normal.        Behavior: Behavior normal.        Assessment & Plan:   Problem List Items Addressed This Visit     MDD (major depressive disorder), recurrent episode, moderate (HCC) - Primary   Chronic, improvement noted with change in antidepressant to lexapro 20mg  daily as well as establishing with counselor. PHQ9/GAD7 scores are improved as well.  Predominant trouble with difficulty concentrating.  Will trial wellbutrin SR 100mg  daily, reviewed side effects vs possible benefits of medication.  Consider ADHD evaluation if no improvement noted with above.  RTC 3 mo f/u visit.       Relevant Medications   buPROPion ER (WELLBUTRIN SR) 100 MG 12 hr tablet     Meds ordered this encounter  Medications   buPROPion ER (WELLBUTRIN SR) 100 MG 12 hr tablet    Sig: Take 1 tablet (100 mg total) by mouth daily.    Dispense:  30 tablet    Refill:  3    No orders of the defined types were placed in this encounter.   Patient Instructions  Trial wellbutrin SR 100mg  daily in am in addition to lexapro.  Return in 3 months for diabetes and mood follow up visit.  Good to see you today.  Follow up  plan: Return in about 3 months (around 10/31/2023) for follow up visit.  Eustaquio Boyden, MD

## 2023-08-03 NOTE — Patient Instructions (Addendum)
 Trial wellbutrin SR 100mg  daily in am in addition to lexapro.  Return in 3 months for diabetes and mood follow up visit.  Good to see you today.

## 2023-08-03 NOTE — Assessment & Plan Note (Addendum)
 Chronic, improvement noted with change in antidepressant to lexapro 20mg  daily as well as establishing with counselor. PHQ9/GAD7 scores are improved as well.  Predominant trouble with difficulty concentrating.  Will trial wellbutrin SR 100mg  daily, reviewed side effects vs possible benefits of medication.  Consider ADHD evaluation if no improvement noted with above.  RTC 3 mo f/u visit.

## 2023-08-04 ENCOUNTER — Other Ambulatory Visit: Payer: Self-pay

## 2023-08-07 ENCOUNTER — Other Ambulatory Visit: Payer: Self-pay | Attending: Medical Genetics

## 2023-08-09 ENCOUNTER — Ambulatory Visit: Payer: BC Managed Care – PPO | Admitting: Licensed Clinical Social Worker

## 2023-08-09 DIAGNOSIS — F331 Major depressive disorder, recurrent, moderate: Secondary | ICD-10-CM

## 2023-08-09 DIAGNOSIS — F419 Anxiety disorder, unspecified: Secondary | ICD-10-CM

## 2023-08-09 NOTE — Progress Notes (Signed)
 A user error has taken place: encounter opened in error, closed for administrative reasons. Patient joined session no camera no sound and exited but did not return to session.  Waited  [facility] Counselor/Therapist Progress Note

## 2023-08-23 ENCOUNTER — Encounter: Payer: Self-pay | Admitting: Family Medicine

## 2023-08-26 ENCOUNTER — Other Ambulatory Visit: Payer: Self-pay | Admitting: Family Medicine

## 2023-08-27 NOTE — Telephone Encounter (Signed)
 Message from pharmacy: REQUEST FOR 90 DAYS PRESCRIPTION.    Last filled:  08/03/23, #30 Last OV:  08/03/23, 3 mo mood f/u Next OV:  11/02/23,

## 2023-09-02 ENCOUNTER — Encounter: Payer: Self-pay | Admitting: Infectious Diseases

## 2023-09-02 ENCOUNTER — Ambulatory Visit: Payer: BC Managed Care – PPO | Attending: Infectious Diseases | Admitting: Infectious Diseases

## 2023-09-02 ENCOUNTER — Other Ambulatory Visit: Payer: Self-pay

## 2023-09-02 ENCOUNTER — Other Ambulatory Visit
Admission: RE | Admit: 2023-09-02 | Discharge: 2023-09-02 | Disposition: A | Discharge status: Home / Self Care | Source: Ambulatory Visit | Attending: Infectious Diseases | Admitting: Infectious Diseases

## 2023-09-02 VITALS — BP 137/89 | HR 86 | Temp 97.7°F | Ht 68.0 in | Wt 286.0 lb

## 2023-09-02 DIAGNOSIS — E538 Deficiency of other specified B group vitamins: Secondary | ICD-10-CM | POA: Insufficient documentation

## 2023-09-02 DIAGNOSIS — Z23 Encounter for immunization: Secondary | ICD-10-CM | POA: Insufficient documentation

## 2023-09-02 DIAGNOSIS — Z113 Encounter for screening for infections with a predominantly sexual mode of transmission: Secondary | ICD-10-CM | POA: Insufficient documentation

## 2023-09-02 DIAGNOSIS — F17210 Nicotine dependence, cigarettes, uncomplicated: Secondary | ICD-10-CM | POA: Diagnosis not present

## 2023-09-02 DIAGNOSIS — Z79899 Other long term (current) drug therapy: Secondary | ICD-10-CM | POA: Diagnosis not present

## 2023-09-02 DIAGNOSIS — B20 Human immunodeficiency virus [HIV] disease: Secondary | ICD-10-CM | POA: Diagnosis not present

## 2023-09-02 DIAGNOSIS — I10 Essential (primary) hypertension: Secondary | ICD-10-CM | POA: Insufficient documentation

## 2023-09-02 DIAGNOSIS — E119 Type 2 diabetes mellitus without complications: Secondary | ICD-10-CM | POA: Diagnosis not present

## 2023-09-02 DIAGNOSIS — Z7985 Long-term (current) use of injectable non-insulin antidiabetic drugs: Secondary | ICD-10-CM | POA: Diagnosis not present

## 2023-09-02 LAB — COMPREHENSIVE METABOLIC PANEL WITH GFR
ALT: 17 U/L (ref 0–44)
AST: 19 U/L (ref 15–41)
Albumin: 3.9 g/dL (ref 3.5–5.0)
Alkaline Phosphatase: 74 U/L (ref 38–126)
Anion gap: 7 (ref 5–15)
BUN: 14 mg/dL (ref 6–20)
CO2: 27 mmol/L (ref 22–32)
Calcium: 8.8 mg/dL — ABNORMAL LOW (ref 8.9–10.3)
Chloride: 104 mmol/L (ref 98–111)
Creatinine, Ser: 0.96 mg/dL (ref 0.44–1.00)
GFR, Estimated: >60 mL/min (ref >60)
Glucose, Bld: 102 mg/dL — ABNORMAL HIGH (ref 70–99)
Potassium: 4.2 mmol/L (ref 3.5–5.1)
Sodium: 138 mmol/L (ref 135–145)
Total Bilirubin: 1 mg/dL (ref 0.0–1.2)
Total Protein: 7.5 g/dL (ref 6.5–8.1)

## 2023-09-02 LAB — CHLAMYDIA/NGC RT PCR (ARMC ONLY)
Chlamydia Tr: NOT DETECTED
Chlamydia Tr: NOT DETECTED
N gonorrhoeae: NOT DETECTED
N gonorrhoeae: NOT DETECTED

## 2023-09-02 NOTE — Progress Notes (Signed)
 NAME: Cassandra [name]  DOB: 1975/08/01  MRN: [id number]  Date/Time: 09/02/2023 12:00 PM   Subjective:  Here for follow up of HIV Last seen March 2024 No significant change in her medical conditions   She is 100% adherent to HAART-on Biktarvy Last Vl < 20 and Cd4 920 on 09/03/22 Weight loss on ozempic 409>811 Had hysterectomy/b salpingectomy in March 2022 Foot surgery June 2022 Doing well   Medical history HIV diagnosed 2013 Nadir Cd4 >200 OI none HAARt history: first regimen Atripla 2nd regimen Biktarvy since Sept 2018  Acquired thru heterosexual contact. Husband HIV positive and on Rx   Past medical history Allergies to animal dander Gestational diabetes mellitus Diabetes mellitus GERD Hypertension Obesity Varicose veins Migraine Endometrial ablation for menorrhagia Hysterectomy Gall stones Depression  Tubular adenoma colon.  Colonoscopy done in January 2020.   Past Surgical History:  Procedure Laterality Date   BIOPSY  07/04/2018   Procedure: BIOPSY;  Surgeon: Tressia Danas, MD;  Location: [facility];  Service: Gastroenterology;;   CESAREAN SECTION  [phone]   x2   CHOLECYSTECTOMY  2018   COLONOSCOPY WITH PROPOFOL N/A 07/04/2018   for chronic diarrhea after cholecystectomy started on colestipol with benefit ([place]). Thought she has IBS-D. 2 TA, rpt 5 yrs   CYSTOSCOPY N/A 08/20/2020   Procedure: CYSTOSCOPY;  Surgeon: [place] Bing, MD;  Location: [facility];  Service: Gynecology;  Laterality: N/A;   ENDOMETRIAL BIOPSY  01/25/2020       HYSTERECTOMY ABDOMINAL WITH SALPINGECTOMY Bilateral 08/20/2020   done for heavy bleed and pelvic pain - pathology showed CIN1 of cervix Vergie Living, Billey Gosling, MD)   HYSTEROSCOPY WITH D & C N/A 01/05/2018   [place] for heavy bleeding, IUD removed - DILATATION AND CURETTAGE /HYSTEROSCOPY WITH MINERVA ABLATION; Reva Bores, MD   INTRAUTERINE DEVICE INSERTION     Mirena   POLYPECTOMY  07/04/2018   Procedure:  POLYPECTOMY;  Surgeon: Tressia Danas, MD;  Location: [facility];  Service: Gastroenterology;;   TOE SURGERY Left 2022   TOTAL LAPAROSCOPIC HYSTERECTOMY WITH SALPINGECTOMY Bilateral 08/20/2020   Procedure: ATTEMPTED TOTAL LAPAROSCOPIC HYSTERECTOMY WITH SALPINGECTOMY;  Surgeon: [place] Bing, MD;  Location: [facility] OR;  Service: Gynecology;  Laterality: Bilateral;   TUBAL LIGATION     WISDOM TOOTH EXTRACTION     x 4     SH Current smoker Occasional alcohol Lives with her husband Collene Gobble female partner  and 2 sons    Family History  Problem Relation Age of Onset   Hypertension Mother    Hypertension Father    Appendicitis Father    Atrial fibrillation Father    Diabetes Maternal Grandmother        s/p amputation   Hyperlipidemia Maternal Grandmother    Hypertension Maternal Grandmother    Stroke Maternal Grandmother    Hyperlipidemia Maternal Grandfather    Hypertension Maternal Grandfather    Skin cancer Maternal Grandfather    Diabetes Maternal Grandfather    Seizures Paternal Grandmother 48       deceased   Coronary artery disease Paternal Grandfather 32       MI   Breast cancer Neg Hx    Colon cancer Neg Hx    Colon polyps Neg Hx    Esophageal cancer Neg Hx    Rectal cancer Neg Hx    Stomach cancer Neg Hx    Allergies  Allergen Reactions   Metformin And Related Diarrhea   Nickel Rash   ? Current Outpatient Medications  Medication Sig Dispense Refill  Accu-Chek FastClix Lancets MISC 1 each by Does not apply route as directed. Use as directed to check blood sugar twice daily as needed. E11.8 102 each 3   amLODipine (NORVASC) 5 MG tablet Take 1 tablet (5 mg total) by mouth daily. 90 tablet 4   BIKTARVY 50-200-25 MG TABS tablet Take 1 tablet by mouth at bedtime. 90 tablet 4   colestipol (COLESTID) 1 g tablet TAKE 2 TABLETS (2 G TOTAL) BY MOUTH EVERY MONDAY, WEDNESDAY, AND FRIDAY. 270 tablet 0   cyanocobalamin (VITAMIN B12) 1000 MCG tablet Take 1 tablet (1,000 mcg  total) by mouth daily.     cyclobenzaprine (FLEXERIL) 5 MG tablet Take 1 tablet (5 mg total) by mouth 2 (two) times daily as needed (headache (sedation precautions)). 30 tablet 3   diphenhydrAMINE (BENADRYL) 25 MG tablet Take 25 mg by mouth at bedtime as needed for allergies or sleep.     escitalopram (LEXAPRO) 20 MG tablet Take 1 tablet (20 mg total) by mouth daily. 90 tablet 4   glucose blood (ACCU-CHEK GUIDE) test strip 1 each by Other route as needed for other. Use as instructed to check blood sugar 100 each 0   lisinopril (ZESTRIL) 10 MG tablet Take 1 tablet (10 mg total) by mouth daily. 90 tablet 4   loratadine (CLARITIN) 10 MG tablet Take 10 mg by mouth daily as needed for allergies.     Multiple Vitamin (MULTIVITAMIN ADULT) TABS Take 1 tablet by mouth daily.     Peppermint Oil (IBGARD PO) Take 2 tablets by mouth daily as needed (diarrhea).     Semaglutide, 1 MG/DOSE, (OZEMPIC, 1 MG/DOSE,) 4 MG/3ML SOPN INJECT 1 MG ONCE A WEEK AS DIRECTED 9 mL 3   No current facility-administered medications for this visit.    REVIEW OF SYSTEMS:  Const: negative fever, negative chills, 30 pound weight loss Eyes: negative diplopia or visual changes, negative eye pain ENT: negative coryza, negative sore throat Resp: negative cough, hemoptysis, dyspnea Cards: negative for chest pain, palpitations, lower extremity edema GU: negative for frequency, dysuria and hematuria Skin: negative for rash and pruritus Heme: negative for easy bruising and gum/nose bleeding MS: negative for myalgias, arthralgias, back pain and muscle weakness Neurolo:none  Psych:stress   Objective:  VITALS: BP 137/89   Pulse 86   Temp 97.7 F (36.5 C) (Temporal)   Ht 5\' 8"  (1.727 m)   Wt 286 lb (129.7 kg)   LMP 07/10/2020   BMI 43.49 kg/m      PHYSICAL EXAM:  General:well Oral cavity no lesions Neck supple No lymphadenopathy Lungs: Clear to auscultation bilaterally. No Wheezing or Rhonchi. No rales. Heart: Regular rate  and rhythm, no murmur, rub or gallop. Abdomen: Soft, non-tender,not distended. Bowel sounds normal. No masses Scar healed well Extremities: Extremities normal, atraumatic, no cyanosis. No edema. No clubbing Skin: No rashes or lesions. Not Jaundiced Lymph: Cervical, supraclavicular normal. Neurologic: Grossly non-focal Pertinent Labs Health maintenance:  Vaccination  Vaccine Date last given comment  Influenza 05/18/19   Hepatitis B 3/10, 5/19 and 02/16/19   Hepatitis A 08/16/18 and 02/16/19   Prevnar-PCV-13    Pneumovac-PPSV-23 01/04/15   TdaP 12/14/12   HPV    Shingrix ( zoster vaccine)     ______________________  Labs Lab Result  Date comment  HIV VL  <20 09/02/21   CD4 1060 3/23   Genotype     [id number] Neg 08/16/2018   HIV antibody     RPR  nonreactive 09/02/21   Quantiferon Gold  negative 09/02/21   Hep C ab Neg 03/30/18   Hepatitis B-ab,ag,c Sag-Neg, Sab neg Cab neg 03/30/18   Hepatitis A-IgM, IgG /T Neg 03/30/18   Lipid TC 170, HDL 41, LDL 108 TGL 99 11/26/17   GC/CHL neg 01/25/20   PAP High risk HPV + 01/25/20          Preventive  Procedure Result  Date comment  colonoscopy Tubular adenoma 07/03/18   Mammogram Normal 11/26/17   Dental exam     Opthal       Impression/Recommendation 48 y.o. female with a history of HIV, hypertension , DM              ? ?HIV under good control.  On Biktarvy.  100% adherent.  Viral load less than 20 and CD4 is 1178.pt is interested in cabenuva 80 pound weight loss since starting ozempic ( semaglutide  HTN  on  triple therapy- amlodipine+ HCTZ= ACEI  DM started on ozempic    B12 deficiency on supplement ( she has been on long term PPI which can cause it)   Health maintenance updated-  Partner notification done  Smoker    Prevnar 20 vaccine given today  Follow up 9-12 months

## 2023-09-03 ENCOUNTER — Other Ambulatory Visit
Admission: RE | Admit: 2023-09-03 | Discharge: 2023-09-03 | Disposition: A | Discharge status: Home / Self Care | Source: Ambulatory Visit | Attending: Infectious Diseases | Admitting: Infectious Diseases

## 2023-09-03 ENCOUNTER — Other Ambulatory Visit
Admission: RE | Admit: 2023-09-03 | Discharge: 2023-09-03 | Disposition: A | Discharge status: Home / Self Care | Payer: Self-pay | Source: Ambulatory Visit | Attending: Medical Genetics | Admitting: Medical Genetics

## 2023-09-03 DIAGNOSIS — Z21 Asymptomatic human immunodeficiency virus [HIV] infection status: Secondary | ICD-10-CM | POA: Diagnosis not present

## 2023-09-03 DIAGNOSIS — Z006 Encounter for examination for normal comparison and control in clinical research program: Secondary | ICD-10-CM | POA: Insufficient documentation

## 2023-09-03 LAB — T-HELPER CELLS CD4/CD8 %
% CD 4 Pos. Lymph.: 50.8 % (ref 30.8–58.5)
Absolute CD 4 Helper: 864 /uL (ref 359–1519)
Basophils Absolute: 0 10*3/uL (ref 0.0–0.2)
Basos: 1 %
CD3+CD4+ Cells/CD3+CD8+ Cells Bld: 2.06 (ref 0.92–3.72)
CD3+CD8+ Cells # Bld: 420 /uL (ref 109–897)
CD3+CD8+ Cells NFr Bld: 24.7 % (ref 12.0–35.5)
EOS (ABSOLUTE): 0.2 10*3/uL (ref 0.0–0.4)
Eos: 3 %
Hematocrit: 42.8 % (ref 34.0–46.6)
Hemoglobin: 14.6 g/dL (ref 11.1–15.9)
Immature Grans (Abs): 0 10*3/uL (ref 0.0–0.1)
Immature Granulocytes: 0 %
Lymphocytes Absolute: 1.7 10*3/uL (ref 0.7–3.1)
Lymphs: 26 %
MCH: 33.6 pg — ABNORMAL HIGH (ref 26.6–33.0)
MCHC: 34.1 g/dL (ref 31.5–35.7)
MCV: 98 fL — ABNORMAL HIGH (ref 79–97)
Monocytes Absolute: 0.4 10*3/uL (ref 0.1–0.9)
Monocytes: 6 %
Neutrophils Absolute: 4.1 10*3/uL (ref 1.4–7.0)
Neutrophils: 64 %
Platelets: 317 10*3/uL (ref 150–450)
RBC: 4.35 x10E6/uL (ref 3.77–5.28)
RDW: 10.8 % — ABNORMAL LOW (ref 11.7–15.4)
WBC: 6.4 10*3/uL (ref 3.4–10.8)

## 2023-09-03 LAB — CBC WITH DIFFERENTIAL/PLATELET
Abs Immature Granulocytes: 0.02 10*3/uL (ref 0.00–0.07)
Basophils Absolute: 0.1 10*3/uL (ref 0.0–0.1)
Basophils Relative: 1 %
Eosinophils Absolute: 0.2 10*3/uL (ref 0.0–0.5)
Eosinophils Relative: 3 %
HCT: 41.4 % (ref 36.0–46.0)
Hemoglobin: 14.1 g/dL (ref 12.0–15.0)
Immature Granulocytes: 0 %
Lymphocytes Relative: 38 %
Lymphs Abs: 2.7 10*3/uL (ref 0.7–4.0)
MCH: 32.8 pg (ref 26.0–34.0)
MCHC: 34.1 g/dL (ref 30.0–36.0)
MCV: 96.3 fL (ref 80.0–100.0)
Monocytes Absolute: 0.5 10*3/uL (ref 0.1–1.0)
Monocytes Relative: 6 %
Neutro Abs: 3.7 10*3/uL (ref 1.7–7.7)
Neutrophils Relative %: 52 %
Platelets: 313 10*3/uL (ref 150–400)
RBC: 4.3 MIL/uL (ref 3.87–5.11)
RDW: 11.1 % — ABNORMAL LOW (ref 11.5–15.5)
WBC: 7.1 10*3/uL (ref 4.0–10.5)
nRBC: 0 % (ref 0.0–0.2)

## 2023-09-03 LAB — HIV-1 RNA QUANT-NO REFLEX-BLD
HIV 1 RNA Quant: <20 {copies}/mL
LOG10 HIV-1 RNA: UNDETERMINED {Log_copies}/mL

## 2023-09-03 LAB — RPR: RPR Ser Ql: NONREACTIVE

## 2023-09-07 LAB — QUANTIFERON-TB GOLD PLUS (RQFGPL)
QuantiFERON Mitogen Value: >10 [IU]/mL
QuantiFERON Nil Value: 0.02 [IU]/mL
QuantiFERON TB1 Ag Value: 0.04 [IU]/mL
QuantiFERON TB2 Ag Value: 0.03 [IU]/mL

## 2023-09-07 LAB — QUANTIFERON-TB GOLD PLUS: QuantiFERON-TB Gold Plus: NEGATIVE

## 2023-09-13 ENCOUNTER — Telehealth: Payer: Self-pay

## 2023-09-13 ENCOUNTER — Other Ambulatory Visit (HOSPITAL_COMMUNITY): Payer: Self-pay

## 2023-09-13 NOTE — Telephone Encounter (Signed)
 Patient aware she will need to use medical benefits for her cabenuva. She will hold off on starting it for now.  Darcella Shiffman Jonathon Resides, CMA

## 2023-09-13 NOTE — Telephone Encounter (Signed)
 Patient informed of labs results and verbalized understanding.  Elynore Dolinski Jonathon Resides, CMA

## 2023-09-13 NOTE — Telephone Encounter (Signed)
-----   Message from Lynn Ito sent at 09/10/2023  7:17 PM EDT ----- You can let her know that her labs look good ----- Message ----- From: Interface, Lab In [place] Sent: 09/03/2023   2:02 PM EDT To: Lynn Ito, MD

## 2023-09-13 NOTE — Telephone Encounter (Signed)
 Pharmacy Patient Advocate Encounter  Insurance verification completed.   The patient is insured through [employer] test claim for [employer]. Currently a quantity of is a 30 day supply and Medication is not covered under the pharmacy benefits, However it will need a PA through Medical Benefits .   This test claim was processed through [facility]- copay amounts may vary at other pharmacies due to pharmacy/plan contracts, or as the patient moves through the different stages of their insurance plan.

## 2023-09-15 ENCOUNTER — Encounter: Payer: Self-pay | Admitting: Family Medicine

## 2023-09-15 DIAGNOSIS — R519 Headache, unspecified: Secondary | ICD-10-CM

## 2023-09-15 DIAGNOSIS — H93A1 Pulsatile tinnitus, right ear: Secondary | ICD-10-CM

## 2023-09-16 ENCOUNTER — Telehealth: Admitting: Nurse Practitioner

## 2023-09-21 LAB — GENECONNECT MOLECULAR SCREEN: Genetic Analysis Overall Interpretation: NEGATIVE

## 2023-10-01 ENCOUNTER — Encounter: Payer: Self-pay | Admitting: Family Medicine

## 2023-10-01 ENCOUNTER — Ambulatory Visit (INDEPENDENT_AMBULATORY_CARE_PROVIDER_SITE_OTHER): Admitting: Family Medicine

## 2023-10-01 VITALS — BP 124/86 | HR 82 | Temp 98.6°F | Ht 68.0 in | Wt 289.2 lb

## 2023-10-01 DIAGNOSIS — R519 Headache, unspecified: Secondary | ICD-10-CM | POA: Diagnosis not present

## 2023-10-01 DIAGNOSIS — G43109 Migraine with aura, not intractable, without status migrainosus: Secondary | ICD-10-CM | POA: Diagnosis not present

## 2023-10-01 DIAGNOSIS — H93A1 Pulsatile tinnitus, right ear: Secondary | ICD-10-CM | POA: Diagnosis not present

## 2023-10-01 MED ORDER — CYCLOBENZAPRINE HCL 5 MG PO TABS: 5.0000 mg | ORAL_TABLET | Freq: Two times a day (BID) | ORAL | 1 refills | Status: AC | PRN

## 2023-10-01 MED ORDER — KETOROLAC TROMETHAMINE 30 MG/ML IJ SOLN
30.0000 mg | Freq: Once | INTRAMUSCULAR | Status: AC
  Administered 2023-10-01: 30 mg via INTRAMUSCULAR

## 2023-10-01 NOTE — Progress Notes (Signed)
 Ph: [phone] Fax: [phone]   Patient ID: Cassandra [name], female    DOB: [birth year], 48 y.o.   MRN: [id number]  This visit was conducted in person.  BP 124/86   Pulse 82   Temp 98.6 F (37 C) (Oral)   Ht 5\' 8"  (1.727 m)   Wt 289 lb 4 oz (131.2 kg)   LMP 07/10/2020   SpO2 97%   BMI 43.98 kg/m   Hearing Screening   500Hz  1000Hz  2000Hz  4000Hz   Right ear 20 20 20 20   Left ear 20 20 20 20    CC: headache, R ear whooshing sound  Subjective:   HPI: Cassandra [name] is a 48 y.o. female presenting on 10/01/2023 for Ear Problem (C/o "whooshing" sound for over 1 wk. Also, c/o HA for for 1 wk. )   1 wk h/o R sided and frontal HA described as pressure pain, occasionally sharp stabbing pain, today headache has worsened. + photo/phonophobia, activity limiting.  No nausea.  This feels similar to previous migraines.   Normally headaches managed with tylenol  or ibuprofen , this time with limited relief. Has been taking pain meds regularly this past week.   Initially thought sinus headache - treating with sinus allergy medicine (decongestant) without benefit.   Endorses h/o migraines, last was 5+ yrs ago. More frequently gets intermittent headaches.   Several weeks of intermittent whooshing sound to right ear. Mostly noted when laying supine, improves when turning to left side. Currently not present.   No recent respiratory infection.  No ear pain, drainage from ear, hearing changes.  No sinus congestion, ST, PNDrainage, fevers or chills.  No new medicines, supplements or vitamins.  No slurred speech, confusion, unilateral facial or body weakness or numbness, double vision or other vision changes.   Last eye exam up to date 08/2023 - WNL. Considering Lasik refractive surgery.      Relevant past medical, surgical, family and social history reviewed and updated as indicated. Interim medical history since our last visit reviewed. Allergies and medications reviewed and  updated. Outpatient Medications Prior to Visit  Medication Sig Dispense Refill   Accu-Chek FastClix Lancets MISC 1 each by Does not apply route as directed. Use as directed to check blood sugar twice daily as needed. E11.8 102 each 3   amLODipine  (NORVASC ) 5 MG tablet Take 1 tablet (5 mg total) by mouth daily. 90 tablet 4   BIKTARVY  50-200-25 MG TABS tablet Take 1 tablet by mouth at bedtime. 90 tablet 4   colestipol  (COLESTID ) 1 g tablet TAKE 2 TABLETS (2 G TOTAL) BY MOUTH EVERY MONDAY, WEDNESDAY, AND FRIDAY. 270 tablet 0   cyanocobalamin  (VITAMIN B12) 1000 MCG tablet Take 1 tablet (1,000 mcg total) by mouth daily.     diphenhydrAMINE  (BENADRYL ) 25 MG tablet Take 25 mg by mouth at bedtime as needed for allergies or sleep.     escitalopram  (LEXAPRO ) 20 MG tablet Take 1 tablet (20 mg total) by mouth daily. 90 tablet 4   glucose blood (ACCU-CHEK GUIDE) test strip 1 each by Other route as needed for other. Use as instructed to check blood sugar 100 each 0   lisinopril  (ZESTRIL ) 10 MG tablet Take 1 tablet (10 mg total) by mouth daily. 90 tablet 4   loratadine (CLARITIN) 10 MG tablet Take 10 mg by mouth daily as needed for allergies.     Multiple Vitamin (MULTIVITAMIN ADULT) TABS Take 1 tablet by mouth daily.     Peppermint Oil (IBGARD PO) Take 2  tablets by mouth daily as needed (diarrhea).     Semaglutide , 1 MG/DOSE, (OZEMPIC , 1 MG/DOSE,) 4 MG/3ML SOPN INJECT 1 MG ONCE A WEEK AS DIRECTED 9 mL 3   cyclobenzaprine  (FLEXERIL ) 5 MG tablet Take 1 tablet (5 mg total) by mouth 2 (two) times daily as needed (headache (sedation precautions)). 30 tablet 3   No facility-administered medications prior to visit.     Per HPI unless specifically indicated in ROS section below Review of Systems  Objective:  BP 124/86   Pulse 82   Temp 98.6 F (37 C) (Oral)   Ht 5\' 8"  (1.727 m)   Wt 289 lb 4 oz (131.2 kg)   LMP 07/10/2020   SpO2 97%   BMI 43.98 kg/m   Wt Readings from Last 3 Encounters:  10/01/23 289  lb 4 oz (131.2 kg)  09/02/23 286 lb (129.7 kg)  08/03/23 286 lb 2 oz (129.8 kg)      Physical Exam Vitals and nursing note reviewed.  Constitutional:      Appearance: Normal appearance. She is not ill-appearing.     Comments: Uncomfortable when fluorescent light is on  HENT:     Head: Normocephalic and atraumatic.     Right Ear: Tympanic membrane, ear canal and external ear normal. There is no impacted cerumen.     Left Ear: Tympanic membrane, ear canal and external ear normal. There is no impacted cerumen.     Mouth/Throat:     Mouth: Mucous membranes are moist.     Pharynx: Oropharynx is clear. No oropharyngeal exudate or posterior oropharyngeal erythema.  Eyes:     General:        Right eye: No discharge.        Left eye: No discharge.     Extraocular Movements: Extraocular movements intact.     Conjunctiva/sclera: Conjunctivae normal.     Pupils: Pupils are equal, round, and reactive to light.  Neck:     Thyroid : No thyroid  mass or thyromegaly.     Vascular: No carotid bruit.     Comments: No carotid or vertebral bruits appreciated Cardiovascular:     Rate and Rhythm: Normal rate and regular rhythm.     Pulses: Normal pulses.     Heart sounds: Normal heart sounds. No murmur heard. Pulmonary:     Effort: Pulmonary effort is normal. No respiratory distress.     Breath sounds: Normal breath sounds. No wheezing, rhonchi or rales.  Musculoskeletal:     Cervical back: Normal range of motion and neck supple. No rigidity or tenderness.     Right lower leg: No edema.     Left lower leg: No edema.  Lymphadenopathy:     Cervical: No cervical adenopathy.  Skin:    General: Skin is warm and dry.     Findings: No rash.  Neurological:     General: No focal deficit present.     Mental Status: She is alert.     Cranial Nerves: Cranial nerves 2-12 are intact.     Sensory: Sensation is intact. No sensory deficit.     Motor: Motor function is intact.     Coordination: Coordination  is intact. Romberg sign negative. Finger-Nose-Finger Test normal.     Gait: Gait is intact.     Comments:  CN 2-12 intact FTN intact EOMI No pronator drift No ataxia  Psychiatric:        Mood and Affect: Mood normal.        Behavior: Behavior  normal.       Results for orders placed or performed during the hospital encounter of 09/03/23  CBC with Differential/Platelet   Collection Time: 09/03/23  1:47 PM  Result Value Ref Range   WBC 7.1 4.0 - 10.5 K/uL   RBC 4.30 3.87 - 5.11 MIL/uL   Hemoglobin 14.1 12.0 - 15.0 g/dL   HCT 40.9 81.1 - 91.4 %   MCV 96.3 80.0 - 100.0 fL   MCH 32.8 26.0 - 34.0 pg   MCHC 34.1 30.0 - 36.0 g/dL   RDW 78.2 (L) 95.6 - 21.3 %   Platelets 313 150 - 400 K/uL   nRBC 0.0 0.0 - 0.2 %   Neutrophils Relative % 52 %   Neutro Abs 3.7 1.7 - 7.7 K/uL   Lymphocytes Relative 38 %   Lymphs Abs 2.7 0.7 - 4.0 K/uL   Monocytes Relative 6 %   Monocytes Absolute 0.5 0.1 - 1.0 K/uL   Eosinophils Relative 3 %   Eosinophils Absolute 0.2 0.0 - 0.5 K/uL   Basophils Relative 1 %   Basophils Absolute 0.1 0.0 - 0.1 K/uL   Immature Granulocytes 0 %   Abs Immature Granulocytes 0.02 0.00 - 0.07 K/uL    Assessment & Plan:   Problem List Items Addressed This Visit     Right-sided headache - Primary   Symptoms most consistent with recurrent migraine however without identified trigger.  Treat as migraine with toradol , flexeril , update if ongoing for further evaluation. Reviewed red flags to seek ER care.       Relevant Medications   cyclobenzaprine  (FLEXERIL ) 5 MG tablet   Migraine   Presumed migraine without identified trigger - treat with IM Toradol  30mg  today, refilled flexeril  muscle relaxant to use abortively.  Reassuring, non-focal neurological exam. Update if not improved over weekend. See below.       Relevant Medications   cyclobenzaprine  (FLEXERIL ) 5 MG tablet   Pulsatile tinnitus of right ear   TMs clear. Reassuring exam without carotid or vertebral aa  bruits.  Hearing is intact.  Nonfocal neurological exam, no neurological symptoms present.  She will update me next week and if ongoing, will consider brain imaging vs ENT evaluation.         Meds ordered this encounter  Medications   cyclobenzaprine  (FLEXERIL ) 5 MG tablet    Sig: Take 1 tablet (5 mg total) by mouth 2 (two) times daily as needed (headache (sedation precautions)).    Dispense:  30 tablet    Refill:  1   ketorolac  (TORADOL ) 30 MG/ML injection 30 mg    No orders of the defined types were placed in this encounter.   Patient Instructions  Toradol  shot today. Don't take any ibuprofen  for the rest of the day.  May use flexeril  muscle relaxant as needed for migraine.  Rest over weekend with plenty of fluids.  Update me next week with symptoms - if ongoing, we may proceed with imaging study.  Let me know if ongoing whooshing to right ear.   Follow up plan: No follow-ups on file.  Claire Crick, MD

## 2023-10-01 NOTE — Patient Instructions (Signed)
 Toradol  shot today. Don't take any ibuprofen  for the rest of the day.  May use flexeril  muscle relaxant as needed for migraine.  Rest over weekend with plenty of fluids.  Update me next week with symptoms - if ongoing, we may proceed with imaging study.  Let me know if ongoing whooshing to right ear.

## 2023-10-02 DIAGNOSIS — H93A1 Pulsatile tinnitus, right ear: Secondary | ICD-10-CM | POA: Insufficient documentation

## 2023-10-02 NOTE — Assessment & Plan Note (Addendum)
 TMs clear. Reassuring exam without carotid or vertebral aa bruits.  Hearing is intact.  Nonfocal neurological exam, no neurological symptoms present.  She will update me next week and if ongoing, will consider brain imaging vs ENT evaluation.

## 2023-10-02 NOTE — Assessment & Plan Note (Addendum)
 Presumed migraine without identified trigger - treat with IM Toradol  30mg  today, refilled flexeril  muscle relaxant to use abortively.  Reassuring, non-focal neurological exam. Update if not improved over weekend. See below.

## 2023-10-02 NOTE — Assessment & Plan Note (Signed)
 Symptoms most consistent with recurrent migraine however without identified trigger.  Treat as migraine with toradol , flexeril , update if ongoing for further evaluation. Reviewed red flags to seek ER care.

## 2023-10-06 ENCOUNTER — Other Ambulatory Visit: Payer: Self-pay | Admitting: Infectious Diseases

## 2023-10-06 DIAGNOSIS — B20 Human immunodeficiency virus [HIV] disease: Secondary | ICD-10-CM

## 2023-10-08 NOTE — Telephone Encounter (Signed)
 Urgent ENT referral placed for [facility] evaluation of pulsatile tinnitus.

## 2023-10-08 NOTE — Addendum Note (Signed)
 Addended by: Claire Crick on: 10/08/2023 09:24 AM   Modules accepted: Orders

## 2023-10-21 DIAGNOSIS — J302 Other seasonal allergic rhinitis: Secondary | ICD-10-CM | POA: Diagnosis not present

## 2023-10-21 DIAGNOSIS — H93A3 Pulsatile tinnitus, bilateral: Secondary | ICD-10-CM | POA: Diagnosis not present

## 2023-10-21 DIAGNOSIS — H9311 Tinnitus, right ear: Secondary | ICD-10-CM | POA: Diagnosis not present

## 2023-10-21 DIAGNOSIS — J301 Allergic rhinitis due to pollen: Secondary | ICD-10-CM | POA: Diagnosis not present

## 2023-10-21 DIAGNOSIS — G4452 New daily persistent headache (NDPH): Secondary | ICD-10-CM | POA: Diagnosis not present

## 2023-10-22 ENCOUNTER — Other Ambulatory Visit: Payer: Self-pay | Admitting: Student

## 2023-10-22 DIAGNOSIS — H93A3 Pulsatile tinnitus, bilateral: Secondary | ICD-10-CM

## 2023-10-25 ENCOUNTER — Encounter: Payer: Self-pay | Admitting: Family Medicine

## 2023-10-25 NOTE — Telephone Encounter (Signed)
Printed form and placed in Dr. Synthia Innocent box.

## 2023-10-26 ENCOUNTER — Encounter: Payer: Self-pay | Admitting: Student

## 2023-10-27 NOTE — Telephone Encounter (Signed)
 See 10/27/23 phn note in pt's son's, Mikka Kissner [[id number]] chart.

## 2023-10-28 DIAGNOSIS — R519 Headache, unspecified: Secondary | ICD-10-CM | POA: Diagnosis not present

## 2023-10-28 DIAGNOSIS — H53149 Visual discomfort, unspecified: Secondary | ICD-10-CM | POA: Diagnosis not present

## 2023-10-28 DIAGNOSIS — Z1331 Encounter for screening for depression: Secondary | ICD-10-CM | POA: Diagnosis not present

## 2023-10-28 DIAGNOSIS — J301 Allergic rhinitis due to pollen: Secondary | ICD-10-CM | POA: Diagnosis not present

## 2023-10-28 DIAGNOSIS — F40298 Other specified phobia: Secondary | ICD-10-CM | POA: Diagnosis not present

## 2023-11-02 ENCOUNTER — Encounter: Payer: Self-pay | Admitting: Family Medicine

## 2023-11-02 ENCOUNTER — Ambulatory Visit (INDEPENDENT_AMBULATORY_CARE_PROVIDER_SITE_OTHER): Payer: BC Managed Care – PPO | Admitting: Family Medicine

## 2023-11-02 VITALS — BP 118/86 | HR 78 | Temp 98.3°F | Ht 68.0 in | Wt 283.2 lb

## 2023-11-02 DIAGNOSIS — K219 Gastro-esophageal reflux disease without esophagitis: Secondary | ICD-10-CM

## 2023-11-02 DIAGNOSIS — Z7985 Long-term (current) use of injectable non-insulin antidiabetic drugs: Secondary | ICD-10-CM

## 2023-11-02 DIAGNOSIS — R1013 Epigastric pain: Secondary | ICD-10-CM | POA: Insufficient documentation

## 2023-11-02 DIAGNOSIS — E538 Deficiency of other specified B group vitamins: Secondary | ICD-10-CM | POA: Diagnosis not present

## 2023-11-02 DIAGNOSIS — Z9109 Other allergy status, other than to drugs and biological substances: Secondary | ICD-10-CM | POA: Insufficient documentation

## 2023-11-02 DIAGNOSIS — J301 Allergic rhinitis due to pollen: Secondary | ICD-10-CM | POA: Diagnosis not present

## 2023-11-02 DIAGNOSIS — E1169 Type 2 diabetes mellitus with other specified complication: Secondary | ICD-10-CM | POA: Diagnosis not present

## 2023-11-02 DIAGNOSIS — F331 Major depressive disorder, recurrent, moderate: Secondary | ICD-10-CM

## 2023-11-02 DIAGNOSIS — H93A1 Pulsatile tinnitus, right ear: Secondary | ICD-10-CM

## 2023-11-02 LAB — CBC WITH DIFFERENTIAL/PLATELET
Basophils Absolute: 0 10*3/uL (ref 0.0–0.1)
Basophils Relative: 0.4 % (ref 0.0–3.0)
Eosinophils Absolute: 0.2 10*3/uL (ref 0.0–0.7)
Eosinophils Relative: 1.7 % (ref 0.0–5.0)
HCT: 41.9 % (ref 36.0–46.0)
Hemoglobin: 14.2 g/dL (ref 12.0–15.0)
Lymphocytes Relative: 26.7 % (ref 12.0–46.0)
Lymphs Abs: 2.7 10*3/uL (ref 0.7–4.0)
MCHC: 33.8 g/dL (ref 30.0–36.0)
MCV: 97.2 fl (ref 78.0–100.0)
Monocytes Absolute: 0.6 10*3/uL (ref 0.1–1.0)
Monocytes Relative: 6.4 % (ref 3.0–12.0)
Neutro Abs: 6.5 10*3/uL (ref 1.4–7.7)
Neutrophils Relative %: 64.8 % (ref 43.0–77.0)
Platelets: 296 10*3/uL (ref 150.0–400.0)
RBC: 4.31 Mil/uL (ref 3.87–5.11)
RDW: 12.1 % (ref 11.5–15.5)
WBC: 10 10*3/uL (ref 4.0–10.5)

## 2023-11-02 LAB — COMPREHENSIVE METABOLIC PANEL WITH GFR
ALT: 35 U/L (ref 0–35)
AST: 24 U/L (ref 0–37)
Albumin: 4.1 g/dL (ref 3.5–5.2)
Alkaline Phosphatase: 67 U/L (ref 39–117)
BUN: 15 mg/dL (ref 6–23)
CO2: 32 meq/L (ref 19–32)
Calcium: 9.3 mg/dL (ref 8.4–10.5)
Chloride: 102 meq/L (ref 96–112)
Creatinine, Ser: 1.03 mg/dL (ref 0.40–1.20)
GFR: 65.3 mL/min (ref >60.00)
Glucose, Bld: 76 mg/dL (ref 70–99)
Potassium: 4.2 meq/L (ref 3.5–5.1)
Sodium: 141 meq/L (ref 135–145)
Total Bilirubin: 0.6 mg/dL (ref 0.2–1.2)
Total Protein: 7.3 g/dL (ref 6.0–8.3)

## 2023-11-02 LAB — POCT GLYCOSYLATED HEMOGLOBIN (HGB A1C): Hemoglobin A1C: 5.6 % (ref 4.0–5.6)

## 2023-11-02 LAB — LIPASE: Lipase: 24 U/L (ref 11.0–59.0)

## 2023-11-02 LAB — VITAMIN B12: Vitamin B-12: 683 pg/mL (ref 211–911)

## 2023-11-02 MED ORDER — OMEPRAZOLE 20 MG PO CPDR: 20.0000 mg | DELAYED_RELEASE_CAPSULE | Freq: Every day | ORAL | Status: DC

## 2023-11-02 MED ORDER — OMEPRAZOLE 40 MG PO CPDR
40.0000 mg | DELAYED_RELEASE_CAPSULE | Freq: Every day | ORAL | 3 refills | Status: DC
Start: 2023-11-02 — End: 2024-01-28

## 2023-11-02 NOTE — Progress Notes (Addendum)
 Ph: [phone] Fax: [phone]   Patient ID: Cassandra [name], female    DOB: April 14, 1976, 48 y.o.   MRN: [id number]  This visit was conducted in person.  BP 118/86   Pulse 78   Temp 98.3 F (36.8 C) (Oral)   Ht 5\' 8"  (1.727 m)   Wt 283 lb 4 oz (128.5 kg)   LMP 07/10/2020   SpO2 96%   BMI 43.07 kg/m    Chief Complaint  Patient presents with   Medical Management of Chronic Issues    Here for 3 mo DM f/u.    Subjective:   Discussed the use of AI scribe software for clinical note transcription with the patient, who gave verbal consent to proceed.  History of Present Illness   Cassandra [name] is a 48 year old female with diabetes, depression, and migraines.  She experiences right-sided frontal headaches with a history of migraines - these are improving. Current treatment includes nortriptyline 10 mg nightly, with plans to increase to 20 mg. Pulsatile tinnitus is present in the right ear. An MRI of the brain is pending. She saw ENT and neurology for this. She has been referred to physical therapy and has significant environmental allergies.  She recently started experiencing sharp, stabbing epigastric pain, nausea, and vomiting - this began a couple of weeks ago. She takes over-the-counter Prilosec 20 mg for acid reflux but limits its use due to past B12 deficiency. 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  For depression, she takes Lexapro  20 mg daily. She is not currently seeing a Veterinary surgeon. Her diabetes is managed with Ozempic  1 mg weekly, with no symptoms of hyperglycemia or hypoglycemia. Her foot exam is due today.      DM - compliant with antihyperglycemic regimen which includes: ozempic  1mg  weekly - with relatively new epigastric pain, worsening reflux, nausea and vomiting, full feeling in abdomen Denies low sugars or hypoglycemic symptoms. Denies paresthesias, blurry vision. Last  diabetic eye exam fall 2024 - records requested from [facility] in [place]. Glucometer brand: accuchek. Last foot exam: today. DSME: ?. Lab Results  Component Value Date   HGBA1C 5.6 11/02/2023   Diabetic Foot Exam - Simple   Simple Foot Form Diabetic Foot exam was performed with the following findings: Yes 11/02/2023  8:15 AM  Visual Inspection No deformities, no ulcerations, no other skin breakdown bilaterally: Yes Sensation Testing Intact to touch and monofilament testing bilaterally: Yes Pulse Check Posterior Tibialis and Dorsalis pulse intact bilaterally: Yes Comments No claudication    Lab Results  Component Value Date   MICROALBUR <0.7 04/26/2023        Relevant past medical, surgical, family and social history reviewed and updated as indicated. Interim medical history since our last visit reviewed. Allergies and medications reviewed and updated. Outpatient Medications Prior to Visit  Medication Sig Dispense Refill   Accu-Chek FastClix Lancets MISC 1 each by Does not apply route as directed. Use as directed to check blood sugar twice daily as needed. E11.8 102 each 3   amLODipine  (NORVASC ) 5 MG tablet Take 1 tablet (5 mg total) by mouth daily. 90 tablet 4   BIKTARVY  50-200-25 MG TABS tablet Take 1 tablet by mouth at bedtime. 90 tablet 4   cetirizine (ZYRTEC) 10 MG tablet Take 10 mg by mouth at bedtime.     colestipol  (COLESTID ) 1 g tablet TAKE 2 TABLETS (2 G TOTAL) BY MOUTH EVERY MONDAY, WEDNESDAY, AND  FRIDAY. 270 tablet 0   cyanocobalamin  (VITAMIN B12) 1000 MCG tablet Take 1 tablet (1,000 mcg total) by mouth daily.     cyclobenzaprine  (FLEXERIL ) 5 MG tablet Take 1 tablet (5 mg total) by mouth 2 (two) times daily as needed (headache (sedation precautions)). 30 tablet 1   EPINEPHrine 0.3 mg/0.3 mL IJ SOAJ injection Inject into the muscle once as needed.     escitalopram  (LEXAPRO ) 20 MG tablet Take 1 tablet (20 mg total) by mouth daily. 90 tablet 4   glucose blood  (ACCU-CHEK GUIDE) test strip 1 each by Other route as needed for other. Use as instructed to check blood sugar 100 each 0   lisinopril  (ZESTRIL ) 10 MG tablet Take 1 tablet (10 mg total) by mouth daily. 90 tablet 4   Multiple Vitamin (MULTIVITAMIN ADULT) TABS Take 1 tablet by mouth daily.     nortriptyline (PAMELOR) 10 MG capsule Take nortriptyline 10 mg nightly for 1 week, then increase 20 mg nightly and continue this dose.     Peppermint Oil (IBGARD PO) Take 2 tablets by mouth daily as needed (diarrhea).     predniSONE  (STERAPRED UNI-PAK 48 TAB) 5 MG (48) TBPK tablet Take by mouth.     Semaglutide , 1 MG/DOSE, (OZEMPIC , 1 MG/DOSE,) 4 MG/3ML SOPN INJECT 1 MG ONCE A WEEK AS DIRECTED 9 mL 3   diphenhydrAMINE  (BENADRYL ) 25 MG tablet Take 25 mg by mouth at bedtime as needed for allergies or sleep.     loratadine (CLARITIN) 10 MG tablet Take 10 mg by mouth daily as needed for allergies.     No facility-administered medications prior to visit.     Per HPI unless specifically indicated in ROS section below Review of Systems  Objective:  BP 118/86   Pulse 78   Temp 98.3 F (36.8 C) (Oral)   Ht 5\' 8"  (1.727 m)   Wt 283 lb 4 oz (128.5 kg)   LMP 07/10/2020   SpO2 96%   BMI 43.07 kg/m   Wt Readings from Last 3 Encounters:  11/02/23 283 lb 4 oz (128.5 kg)  10/01/23 289 lb 4 oz (131.2 kg)  09/02/23 286 lb (129.7 kg)   Physical Exam Vitals and nursing note reviewed.  Constitutional:      Appearance: Normal appearance. She is not ill-appearing.  Eyes:     Extraocular Movements: Extraocular movements intact.     Conjunctiva/sclera: Conjunctivae normal.     Pupils: Pupils are equal, round, and reactive to light.  Neck:     Thyroid : No thyroid  mass, thyromegaly or thyroid  tenderness.  Cardiovascular:     Rate and Rhythm: Normal rate and regular rhythm.     Pulses: Normal pulses.     Heart sounds: Normal heart sounds. No murmur heard. Pulmonary:     Effort: Pulmonary effort is normal. No  respiratory distress.     Breath sounds: Normal breath sounds. No wheezing, rhonchi or rales.  Abdominal:     General: Bowel sounds are normal. There is no distension.     Palpations: Abdomen is soft. There is no mass.     Tenderness: There is abdominal tenderness (mild-mod) in the epigastric area. There is no right CVA tenderness, left CVA tenderness, guarding or rebound. Negative signs include Murphy's sign.     Hernia: No hernia is present.  Musculoskeletal:     Cervical back: Normal range of motion and neck supple.     Right lower leg: No edema.     Left lower leg: No  edema.  Skin:    General: Skin is warm and dry.     Findings: No rash.  Neurological:     Mental Status: She is alert.  Psychiatric:        Mood and Affect: Mood normal.        Behavior: Behavior normal.        Results   DIAGNOSTIC Allergy testing: Positive for dust mites, cat dander, dog dander, cockroaches, mold       Results for orders placed or performed in visit on 11/02/23  POCT glycosylated hemoglobin (Hb A1C)   Collection Time: 11/02/23  8:17 AM  Result Value Ref Range   Hemoglobin A1C 5.6 4.0 - 5.6 %   HbA1c POC (<> result, manual entry)     HbA1c, POC (prediabetic range)     HbA1c, POC (controlled diabetic range)        11/02/2023    8:09 AM 08/03/2023    9:31 AM 05/03/2023   11:11 AM 09/03/2022    9:20 AM 11/21/2021    9:21 AM  Depression screen PHQ 2/9  Decreased Interest 1 0 1 0 1  Down, Depressed, Hopeless 1 0 1 1 1   PHQ - 2 Score 2 0 2 1 2   Altered sleeping 2 0 2  1  Tired, decreased energy 1 1 2  1   Change in appetite 1 1 2  1   Feeling bad or failure about yourself  2 1 1  1   Trouble concentrating 2 2 3  2   Moving slowly or fidgety/restless 2 0 2  0  Suicidal thoughts 1 0 2  0  PHQ-9 Score 13 5 16  8   Difficult doing work/chores Somewhat difficult Somewhat difficult Very difficult  Somewhat difficult       11/02/2023    8:09 AM 08/03/2023    9:31 AM 05/03/2023   11:11 AM  11/21/2021    9:22 AM  GAD 7 : Generalized Anxiety Score  Nervous, Anxious, on Edge 1 1 1 1   Control/stop worrying 1 1 2 1   Worry too much - different things 1 1 2 1   Trouble relaxing 2 1 2 1   Restless 2 1 2 1   Easily annoyed or irritable 2  1 1   Afraid - awful might happen 1 1 1 1   Total GAD 7 Score 10  11 7   Anxiety Difficulty Somewhat difficult Somewhat difficult Somewhat difficult Somewhat difficult   Assessment & Plan:      Type 2 diabetes mellitus Managed with Ozempic  1 mg weekly. Concern for pancreatitis due to epigastric pain, nausea, and vomiting. - Check blood work for pancreatitis. - Request ophthalmologist records from [place]. - Perform foot exam.  Gastroesophageal reflux disease (GERD) Possible exacerbation of GERD symptoms. Prescribed omeprazole  40 mg daily for three weeks, then as needed. Concern for B12 deficiency with prolonged use. - Prescribe omeprazole  40 mg daily for three weeks, then as needed. - Update B12 levels - she continues daily oral replacement.  Depression Managed with Lexapro  20 mg daily. Wellbutrin  SR discontinued due to side effects. Plans to find a new counselor. - Continue Lexapro  20 mg daily. - Encourage finding a new counselor. - recently started nortriptyline for migraine prevention  Migraine Recurrent migraines with tension component. On nortriptyline 10 mg nightly, plan to increase to 20 mg. Awaiting MRI results. - Continue nortriptyline 10 mg nightly, increase to 20 mg as planned. - Await MRI brain results.  Environmental allergies High sensitivity to dust mites, cat dander,  dog dander, cockroaches, and mold. Allergy shots recommended by ENT. - Start allergy shots twice a week for three months, then once a week. - Carry EpiPen for potential allergic reactions.      Problem List Items Addressed This Visit     Type 2 diabetes mellitus with other specified complication (HCC) - Primary   See above. Great control on Ozempic  -  awaiting eval for pancreatitis. Requested latest eye exam Foot exam today.       Relevant Orders   POCT glycosylated hemoglobin (Hb A1C) (Completed)   MDD (major depressive disorder), recurrent episode, moderate (HCC)   Chronic, stable period on current regimen Recent addition of nortriptyline.  Encouraged finding new counselor      Relevant Medications   nortriptyline (PAMELOR) 10 MG capsule   GERD (gastroesophageal reflux disease)   Start omeprazole  40mg  daily x 3 wks then PRN      Relevant Medications   omeprazole  (PRILOSEC) 40 MG capsule   Vitamin B12 deficiency   Update b12 levels on oral daily replacement.      Relevant Orders   Vitamin B12   Pulsatile tinnitus of right ear   Pending brain MRI via ENT.       Epigastric pain   On ozempic  and colestipol  Check lipase, CBC, CMP.       Relevant Orders   Comprehensive metabolic panel with GFR   CBC with Differential/Platelet   Lipase   Environmental allergies   Allergy testing through ENT: Positive for dust mites, cat dander, dog dander, cockroaches, mold  Discussing allergy shots.         Meds ordered this encounter  Medications   DISCONTD: omeprazole  (PRILOSEC) 20 MG capsule    Sig: Take 1 capsule (20 mg total) by mouth daily.   omeprazole  (PRILOSEC) 40 MG capsule    Sig: Take 1 capsule (40 mg total) by mouth daily. For 3 weeks then as needed    Dispense:  30 capsule    Refill:  3    Orders Placed This Encounter  Procedures   Comprehensive metabolic panel with GFR   CBC with Differential/Platelet   Lipase   Vitamin B12   POCT glycosylated hemoglobin (Hb A1C)    Patient Instructions  We will request records from [facility] doctor in [facility] today   Today, we discussed your ongoing headaches, gastrointestinal symptoms, and overall management of your diabetes, depression, and migraines. We reviewed your current medications and made some adjustments to better manage your symptoms. We also  discussed your environmental allergies and the need for allergy shots.  YOUR PLAN:  -TYPE 2 DIABETES MELLITUS: Type 2 diabetes is a condition where your body does not use insulin  properly, leading to high blood sugar levels. Your diabetes is managed with Ozempic  1 mg weekly. Due to your recent epigastric pain, nausea, and vomiting, we will check your blood work for pancreatitis. We will also request your ophthalmologist records from [facility] and perform a foot exam today.  -GASTROESOPHAGEAL REFLUX DISEASE (GERD): GERD is a condition where stomach acid frequently flows back into the tube connecting your mouth and stomach, causing irritation. Your symptoms have worsened, so we prescribed omeprazole  40 mg daily for three weeks, then as needed. We will also monitor your B12 levels due to the potential for deficiency with prolonged use of this medication.  -DEPRESSION: Depression is a mood disorder that causes persistent feelings of sadness and loss of interest. You are currently taking Lexapro  20 mg daily. We encourage you  to find a new counselor to help manage your depression. You have also been started on nortriptyline which may help mood.   -MIGRAINE: Migraines are severe headaches often accompanied by nausea, vomiting, and sensitivity to light and sound. You are taking nortriptyline 10 mg nightly, and we plan to increase it to 20 mg. We are also awaiting the results of your MRI brain scan.  -ENVIRONMENTAL ALLERGIES: Environmental allergies are reactions to substances in your surroundings, such as dust mites, pet dander, and mold. You have been recommended to start allergy shots twice a week for three months, then once a week. Please carry an EpiPen for potential allergic reactions.  INSTRUCTIONS: Please follow up with the blood work for pancreatitis and ensure we receive your ophthalmologist records from [place]. Continue with the prescribed medications and monitor your symptoms. Schedule an  appointment with a new counselor for your depression. Start your allergy shots as recommended and carry your EpiPen at all times. We will review your MRI results once they are available.  Follow up plan: Return in about 3 months (around 02/02/2024) for follow up visit.  Claire Crick, MD

## 2023-11-02 NOTE — Assessment & Plan Note (Signed)
 Allergy testing through ENT: Positive for dust mites, cat dander, dog dander, cockroaches, mold  Discussing allergy shots.

## 2023-11-02 NOTE — Assessment & Plan Note (Signed)
 Update b12 levels on oral daily replacement.

## 2023-11-02 NOTE — Assessment & Plan Note (Signed)
 Chronic, stable period on current regimen Recent addition of nortriptyline.  Encouraged finding new counselor

## 2023-11-02 NOTE — Assessment & Plan Note (Signed)
 Start omeprazole  40mg  daily x 3 wks then PRN

## 2023-11-02 NOTE — Assessment & Plan Note (Signed)
 On ozempic  and colestipol  Check lipase, CBC, CMP.

## 2023-11-02 NOTE — Assessment & Plan Note (Signed)
 See above. Great control on Ozempic  - awaiting eval for pancreatitis. Requested latest eye exam Foot exam today.

## 2023-11-02 NOTE — Assessment & Plan Note (Signed)
 Pending brain MRI via ENT.

## 2023-11-02 NOTE — Patient Instructions (Addendum)
 We will request records from [facility] doctor in [facility] today   Today, we discussed your ongoing headaches, gastrointestinal symptoms, and overall management of your diabetes, depression, and migraines. We reviewed your current medications and made some adjustments to better manage your symptoms. We also discussed your environmental allergies and the need for allergy shots.  YOUR PLAN:  -TYPE 2 DIABETES MELLITUS: Type 2 diabetes is a condition where your body does not use insulin  properly, leading to high blood sugar levels. Your diabetes is managed with Ozempic  1 mg weekly. Due to your recent epigastric pain, nausea, and vomiting, we will check your blood work for pancreatitis. We will also request your ophthalmologist records from [facility] and perform a foot exam today.  -GASTROESOPHAGEAL REFLUX DISEASE (GERD): GERD is a condition where stomach acid frequently flows back into the tube connecting your mouth and stomach, causing irritation. Your symptoms have worsened, so we prescribed omeprazole  40 mg daily for three weeks, then as needed. We will also monitor your B12 levels due to the potential for deficiency with prolonged use of this medication.  -DEPRESSION: Depression is a mood disorder that causes persistent feelings of sadness and loss of interest. You are currently taking Lexapro  20 mg daily. We encourage you to find a new counselor to help manage your depression. You have also been started on nortriptyline which may help mood.   -MIGRAINE: Migraines are severe headaches often accompanied by nausea, vomiting, and sensitivity to light and sound. You are taking nortriptyline 10 mg nightly, and we plan to increase it to 20 mg. We are also awaiting the results of your MRI brain scan.  -ENVIRONMENTAL ALLERGIES: Environmental allergies are reactions to substances in your surroundings, such as dust mites, pet dander, and mold. You have been recommended to start allergy shots twice a week for  three months, then once a week. Please carry an EpiPen for potential allergic reactions.  INSTRUCTIONS: Please follow up with the blood work for pancreatitis and ensure we receive your ophthalmologist records from [place]. Continue with the prescribed medications and monitor your symptoms. Schedule an appointment with a new counselor for your depression. Start your allergy shots as recommended and carry your EpiPen at all times. We will review your MRI results once they are available.

## 2023-11-04 ENCOUNTER — Ambulatory Visit: Payer: Self-pay | Admitting: Family Medicine

## 2023-11-05 ENCOUNTER — Ambulatory Visit
Admission: RE | Admit: 2023-11-05 | Discharge: 2023-11-05 | Disposition: A | Discharge status: Home / Self Care | Source: Ambulatory Visit | Attending: Student | Admitting: Student

## 2023-11-05 DIAGNOSIS — R9082 White matter disease, unspecified: Secondary | ICD-10-CM | POA: Diagnosis not present

## 2023-11-05 DIAGNOSIS — H93A3 Pulsatile tinnitus, bilateral: Secondary | ICD-10-CM

## 2023-11-05 MED ORDER — GADOPICLENOL 0.5 MMOL/ML IV SOLN
10.0000 mL | Freq: Once | INTRAVENOUS | Status: AC | PRN
  Administered 2023-11-05: 10 mL via INTRAVENOUS

## 2023-11-09 DIAGNOSIS — J301 Allergic rhinitis due to pollen: Secondary | ICD-10-CM | POA: Diagnosis not present

## 2023-11-12 DIAGNOSIS — J301 Allergic rhinitis due to pollen: Secondary | ICD-10-CM | POA: Diagnosis not present

## 2023-11-16 DIAGNOSIS — J301 Allergic rhinitis due to pollen: Secondary | ICD-10-CM | POA: Diagnosis not present

## 2023-11-18 DIAGNOSIS — J302 Other seasonal allergic rhinitis: Secondary | ICD-10-CM | POA: Diagnosis not present

## 2023-11-18 DIAGNOSIS — H93A1 Pulsatile tinnitus, right ear: Secondary | ICD-10-CM | POA: Diagnosis not present

## 2023-11-19 DIAGNOSIS — J301 Allergic rhinitis due to pollen: Secondary | ICD-10-CM | POA: Diagnosis not present

## 2023-11-23 DIAGNOSIS — J301 Allergic rhinitis due to pollen: Secondary | ICD-10-CM | POA: Diagnosis not present

## 2023-11-26 DIAGNOSIS — J301 Allergic rhinitis due to pollen: Secondary | ICD-10-CM | POA: Diagnosis not present

## 2023-11-30 DIAGNOSIS — J301 Allergic rhinitis due to pollen: Secondary | ICD-10-CM | POA: Diagnosis not present

## 2023-12-02 DIAGNOSIS — F40298 Other specified phobia: Secondary | ICD-10-CM | POA: Diagnosis not present

## 2023-12-02 DIAGNOSIS — R519 Headache, unspecified: Secondary | ICD-10-CM | POA: Diagnosis not present

## 2023-12-02 DIAGNOSIS — H53149 Visual discomfort, unspecified: Secondary | ICD-10-CM | POA: Diagnosis not present

## 2023-12-03 ENCOUNTER — Encounter: Payer: Self-pay | Admitting: Family Medicine

## 2023-12-03 ENCOUNTER — Other Ambulatory Visit: Payer: Self-pay | Admitting: Unknown Physician Specialty

## 2023-12-03 DIAGNOSIS — H93A1 Pulsatile tinnitus, right ear: Secondary | ICD-10-CM

## 2023-12-03 NOTE — Telephone Encounter (Addendum)
 Is this for patient or for her son? Has son's name and DOB on top.  I filled out for patient.

## 2023-12-03 NOTE — Telephone Encounter (Signed)
 Printed [employer] Pre-Participation Physical form and placed in Dr Talmadge box.

## 2023-12-06 NOTE — Telephone Encounter (Signed)
Form is in basket on Lisa's desk pending response from pt.

## 2023-12-06 NOTE — Telephone Encounter (Signed)
Placed form at front office. Made copy to scan.  

## 2023-12-07 DIAGNOSIS — J301 Allergic rhinitis due to pollen: Secondary | ICD-10-CM | POA: Diagnosis not present

## 2023-12-12 ENCOUNTER — Other Ambulatory Visit: Payer: Self-pay | Admitting: Family Medicine

## 2023-12-12 DIAGNOSIS — E1169 Type 2 diabetes mellitus with other specified complication: Secondary | ICD-10-CM

## 2023-12-14 ENCOUNTER — Inpatient Hospital Stay
Admission: RE | Admit: 2023-12-14 | Discharge: 2023-12-14 | Disposition: A | Discharge status: Home / Self Care | Source: Ambulatory Visit | Attending: Unknown Physician Specialty | Admitting: Unknown Physician Specialty

## 2023-12-14 DIAGNOSIS — H93A1 Pulsatile tinnitus, right ear: Secondary | ICD-10-CM | POA: Diagnosis not present

## 2023-12-14 NOTE — Telephone Encounter (Signed)
 ERx

## 2023-12-14 NOTE — Telephone Encounter (Signed)
 Forms picked up by patient

## 2023-12-15 DIAGNOSIS — J301 Allergic rhinitis due to pollen: Secondary | ICD-10-CM | POA: Diagnosis not present

## 2023-12-23 ENCOUNTER — Emergency Department (HOSPITAL_COMMUNITY)

## 2023-12-23 ENCOUNTER — Encounter (HOSPITAL_COMMUNITY): Payer: Self-pay

## 2023-12-23 ENCOUNTER — Other Ambulatory Visit: Payer: Self-pay

## 2023-12-23 ENCOUNTER — Emergency Department (HOSPITAL_COMMUNITY)
Admission: EM | Admit: 2023-12-23 | Discharge: 2023-12-23 | Disposition: A | Discharge status: Home / Self Care | Attending: Emergency Medicine | Admitting: Emergency Medicine

## 2023-12-23 DIAGNOSIS — M25562 Pain in left knee: Secondary | ICD-10-CM | POA: Diagnosis not present

## 2023-12-23 DIAGNOSIS — E119 Type 2 diabetes mellitus without complications: Secondary | ICD-10-CM | POA: Diagnosis not present

## 2023-12-23 DIAGNOSIS — W010XXA Fall on same level from slipping, tripping and stumbling without subsequent striking against object, initial encounter: Secondary | ICD-10-CM | POA: Diagnosis not present

## 2023-12-23 DIAGNOSIS — Z21 Asymptomatic human immunodeficiency virus [HIV] infection status: Secondary | ICD-10-CM | POA: Diagnosis not present

## 2023-12-23 DIAGNOSIS — S8992XA Unspecified injury of left lower leg, initial encounter: Secondary | ICD-10-CM | POA: Diagnosis not present

## 2023-12-23 MED ORDER — HYDROCODONE-ACETAMINOPHEN 5-325 MG PO TABS
1.0000 | ORAL_TABLET | Freq: Once | ORAL | Status: AC
  Administered 2023-12-23: 1 via ORAL
  Filled 2023-12-23: qty 1

## 2023-12-23 NOTE — ED Triage Notes (Signed)
 Pt arrived via POV c/o fall injury to left knee. Pt reports hearing multiple popping sounds when she slipped and fell while at [facility]. Pt unable to bear weight on LLE at this time.

## 2023-12-23 NOTE — ED Provider Notes (Signed)
 [place] EMERGENCY DEPARTMENT AT [facility] Provider Note   CSN: [id number] Arrival date & time: 12/23/23  1818     Patient presents with: Cassandra [name] is a 48 y.o. female. She has history of type 2 diabetes, HIV, L1 compression fracture.  Presents to ER complaining of left knee pain today after fall.  She was at [facility] and slipped and mud, trying to stop herself from falling and twisted the knee and is having pain along the medial and posterior aspect.  She is not able to bear weight since the injury.  This happened this evening, she took Aleve around 6:30 PM without relief.  She denies head injury or loss of consciousness, did not get dizzy prior to falling.    Fall       Prior to Admission medications   Medication Sig Start Date End Date Taking? Authorizing Provider  Accu-Chek FastClix Lancets MISC 1 each by Does not apply route as directed. Use as directed to check blood sugar twice daily as needed. E11.8 10/03/19   [name], MD  amLODipine  (NORVASC ) 5 MG tablet Take 1 tablet (5 mg total) by mouth daily. 05/03/23   [name], MD  BIKTARVY  50-200-25 MG TABS tablet Take 1 tablet by mouth at bedtime. 10/08/23   Fayette Bodily, MD  cetirizine (ZYRTEC) 10 MG tablet Take 10 mg by mouth at bedtime. 10/27/23   [provider]  colestipol  (COLESTID ) 1 g tablet TAKE 2 TABLETS (2 G TOTAL) BY MOUTH EVERY MONDAY, WEDNESDAY, AND FRIDAY. 06/14/23   [name], MD  cyanocobalamin  (VITAMIN B12) 1000 MCG tablet Take 1 tablet (1,000 mcg total) by mouth daily. 05/03/23   [name], MD  cyclobenzaprine  (FLEXERIL ) 5 MG tablet Take 1 tablet (5 mg total) by mouth 2 (two) times daily as needed (headache (sedation precautions)). 10/01/23   [name], MD  EPINEPHrine 0.3 mg/0.3 mL IJ SOAJ injection Inject into the muscle once as needed. 10/28/23   [provider]  escitalopram  (LEXAPRO ) 20 MG tablet Take 1 tablet (20 mg  total) by mouth daily. 05/03/23   [name], MD  glucose blood (ACCU-CHEK GUIDE) test strip 1 each by Other route as needed for other. Use as instructed to check blood sugar 01/17/19   [name], MD  lisinopril  (ZESTRIL ) 10 MG tablet Take 1 tablet (10 mg total) by mouth daily. 05/03/23   [name], MD  Multiple Vitamin (MULTIVITAMIN ADULT) TABS Take 1 tablet by mouth daily. 05/25/22   [name], MD  nortriptyline (PAMELOR) 10 MG capsule Take nortriptyline 10 mg nightly for 1 week, then increase 20 mg nightly and continue this dose. 10/28/23   [provider]  omeprazole  (PRILOSEC) 40 MG capsule Take 1 capsule (40 mg total) by mouth daily. For 3 weeks then as needed 11/02/23   [name], MD  Peppermint Oil (IBGARD PO) Take 2 tablets by mouth daily as needed (diarrhea).    [provider]  predniSONE  (STERAPRED UNI-PAK 48 TAB) 5 MG (48) TBPK tablet Take by mouth. 10/21/23   [provider]  Semaglutide , 1 MG/DOSE, (OZEMPIC , 1 MG/DOSE,) 4 MG/3ML SOPN INJECT 1 MG ONCE A WEEK AS DIRECTED 12/14/23   [name], MD    Allergies: Metformin  and related and Nickel    Review of Systems  Updated Vital Signs BP (!) 129/96 (BP Location: Right Arm)   Pulse 99   Temp 98 F (36.7 C) (Oral)   Resp 18   Ht 5'  8 (1.727 m)   Wt 128.5 kg   LMP 07/10/2020   SpO2 99%   BMI 43.07 kg/m   Physical Exam Vitals and nursing note reviewed.  Constitutional:      General: She is not in acute distress.    Appearance: She is well-developed.  HENT:     Head: Normocephalic and atraumatic.  Eyes:     Conjunctiva/sclera: Conjunctivae normal.  Cardiovascular:     Rate and Rhythm: Normal rate and regular rhythm.     Heart sounds: No murmur heard. Pulmonary:     Effort: Pulmonary effort is normal. No respiratory distress.     Breath sounds: Normal breath sounds.  Abdominal:     Palpations: Abdomen is soft.     Tenderness: There is no abdominal  tenderness.  Musculoskeletal:        General: No swelling.     Cervical back: Neck supple.     Comments: Left knee is not swollen, patient can bend to 90 degrees without difficulty.  Negative anterior posterior drawer.  No severe laxity on varus or valgus stress on exam.  DP/PT pulses are intact.  Skin:    General: Skin is warm and dry.     Capillary Refill: Capillary refill takes less than 2 seconds.  Neurological:     General: No focal deficit present.     Mental Status: She is alert.  Psychiatric:        Mood and Affect: Mood normal.     (all labs ordered are listed, but only abnormal results are displayed) Labs Reviewed - No data to display  EKG: None  Radiology: DG Knee Complete 4 Views Left Result Date: 12/23/2023 CLINICAL DATA:  Fall, injury. EXAM: LEFT KNEE - COMPLETE 4+ VIEW COMPARISON:  None Available. FINDINGS: No evidence of fracture, dislocation, or joint effusion. No evidence of arthropathy or other focal bone abnormality. Soft tissues are unremarkable. IMPRESSION: Negative. Electronically Signed   By: Greig Pique M.D.   On: 12/23/2023 19:37     Procedures   Medications Ordered in the ED - No data to display                                  Medical Decision Making Differential diagnosis includes but not limited to fracture, sprain, strain, contusion, cartilaginous injury, effusion, other ED course: Patient here for left knee pain after slipping in the mud and falling today, felt a twisting and popping sensation in the left knee is having medial and posterior knee pain, she is good pulses, discussed likely soft tissue injury in light of normal x-rays.  Discussed with her we will treat her with knee immobilizer, crutches to limit weightbearing, RICE therapy and orthopedic follow-up.  She is given strict return precautions.  Amount and/or Complexity of Data Reviewed Radiology: ordered and independent interpretation performed.    Details: Fracture or dislocation  or joint effusion left knee I agree with radiology read  Risk Prescription drug management.        Final diagnoses:  None    ED Discharge Orders     None          Suellen Sherran LABOR, PA-C 12/23/23 2054    Elnor Savant A, DO 12/24/23 1550

## 2023-12-23 NOTE — Discharge Instructions (Addendum)
 Today for knee injury.  Her x-ray does not show any broken bone or dislocation but I suspect you have a sprain or possible cartilage injury given how much pain you are having, the popping sensation you have out and your inability to put weight on your knee.  Do not apply a knee immobilizer, give you crutches have you limit your weightbearing and you can take over-the-counter Tylenol  and ibuprofen  as needed for discomfort, use ice, keep the leg elevated when you are sitting down and follow-up with orthopedics.  Come back to the ER for new or worsening symptoms.  I gave you the information for the doctor on-call but also gave information for local orthopedics.

## 2023-12-27 DIAGNOSIS — S82832A Other fracture of upper and lower end of left fibula, initial encounter for closed fracture: Secondary | ICD-10-CM | POA: Diagnosis not present

## 2023-12-27 DIAGNOSIS — S83412A Sprain of medial collateral ligament of left knee, initial encounter: Secondary | ICD-10-CM | POA: Diagnosis not present

## 2023-12-27 DIAGNOSIS — M25462 Effusion, left knee: Secondary | ICD-10-CM | POA: Diagnosis not present

## 2023-12-29 DIAGNOSIS — S83412A Sprain of medial collateral ligament of left knee, initial encounter: Secondary | ICD-10-CM | POA: Diagnosis not present

## 2023-12-29 DIAGNOSIS — M25462 Effusion, left knee: Secondary | ICD-10-CM | POA: Diagnosis not present

## 2024-01-03 ENCOUNTER — Ambulatory Visit (INDEPENDENT_AMBULATORY_CARE_PROVIDER_SITE_OTHER): Admitting: Family Medicine

## 2024-01-03 ENCOUNTER — Encounter: Payer: Self-pay | Admitting: Family Medicine

## 2024-01-03 VITALS — BP 112/82 | HR 88 | Temp 97.9°F | Ht 68.0 in | Wt 281.4 lb

## 2024-01-03 DIAGNOSIS — S8992XA Unspecified injury of left lower leg, initial encounter: Secondary | ICD-10-CM | POA: Diagnosis not present

## 2024-01-03 DIAGNOSIS — I83892 Varicose veins of left lower extremities with other complications: Secondary | ICD-10-CM | POA: Diagnosis not present

## 2024-01-03 NOTE — Patient Instructions (Signed)
 Continue treatment pan  with knee immobilizer  Will await ortho recommendations tomorrow.  I will also refer you to vein doctor for evaluation of varicosities.

## 2024-01-03 NOTE — Progress Notes (Signed)
 Ph: [phone] Fax: [phone]   Patient ID: Cassandra [name], female    DOB: 1975/08/26, 48 y.o.   MRN: [id number]  This visit was conducted in person.  BP 112/82   Pulse 88   Temp 97.9 F (36.6 C) (Oral)   Ht 5' 8 (1.727 m)   Wt 281 lb 6 oz (127.6 kg)   LMP 07/10/2020   SpO2 94%   BMI 42.78 kg/m    CC: ER f/u visit  Subjective:   HPI: BETTYJEAN [name] is a 48 y.o. female presenting on 01/03/2024 for Hospitalization Follow-up (Patient accompanied by husband Laurier. Patient went to Emergency room on 12/20/23 for cut on Left leg / fracture. Due to a fall)   On 12/20/2023 - nicked varicose vein [place] camp. Significant bleeding, had trouble fully stopping bleed.   DOI: 12/23/2023.  ER visit 12/23/2023 for L knee pain after fall - slipped on mud, twisted knee.  Xray reassuringly negative for fracture.   Saw [facility] in f/u 12/27/2023 - dx L knee sprain of MCL and nondisplaced proximal fibular fracture with concerns for possible ACL or medial meniscal tear. Rec conservative treatment including knee immobilizer, crutches to remain non-weight bearing, MRI last week showing ACL tear and high grade contusion of MCL complex, as well as ramp lesion to medial meniscus. To see orthopedist tomorrow to discuss plan.   Managing pain with alternating tylenol /ibuprofen  with benefit.   DG Knee Complete 4 Views Left CLINICAL DATA:  Fall, injury.  EXAM: LEFT KNEE - COMPLETE 4+ VIEW  COMPARISON:  None Available.  FINDINGS: No evidence of fracture, dislocation, or joint effusion. No evidence of arthropathy or other focal bone abnormality. Soft tissues are unremarkable.  IMPRESSION: Negative.  Electronically Signed   By: Greig Pique M.D.   On: 12/23/2023 19:37   Preop evaluation: This is not a high risk surgery (intraperitoneal, intrathoracic, or suprainguinal vascular) No history of ischemic heart disease, congestive heart failure, cerebrovascular disease,  preoperative insulin  use, or chronic kidney disease with preop creatinine >2.  Revised cardiac risk index score = 0      Relevant past medical, surgical, family and social history reviewed and updated as indicated. Interim medical history since our last visit reviewed. Allergies and medications reviewed and updated. Outpatient Medications Prior to Visit  Medication Sig Dispense Refill   Accu-Chek FastClix Lancets MISC 1 each by Does not apply route as directed. Use as directed to check blood sugar twice daily as needed. E11.8 102 each 3   amLODipine  (NORVASC ) 5 MG tablet Take 1 tablet (5 mg total) by mouth daily. 90 tablet 4   BIKTARVY  50-200-25 MG TABS tablet Take 1 tablet by mouth at bedtime. 90 tablet 4   cetirizine (ZYRTEC) 10 MG tablet Take 10 mg by mouth at bedtime.     colestipol  (COLESTID ) 1 g tablet TAKE 2 TABLETS (2 G TOTAL) BY MOUTH EVERY MONDAY, WEDNESDAY, AND FRIDAY. 270 tablet 0   cyanocobalamin  (VITAMIN B12) 1000 MCG tablet Take 1 tablet (1,000 mcg total) by mouth daily.     cyclobenzaprine  (FLEXERIL ) 5 MG tablet Take 1 tablet (5 mg total) by mouth 2 (two) times daily as needed (headache (sedation precautions)). 30 tablet 1   EPINEPHrine 0.3 mg/0.3 mL IJ SOAJ injection Inject into the muscle once as needed.     escitalopram  (LEXAPRO ) 20 MG tablet Take 1 tablet (20 mg total) by mouth daily. 90 tablet 4   glucose blood (ACCU-CHEK GUIDE) test strip 1 each  by Other route as needed for other. Use as instructed to check blood sugar 100 each 0   lisinopril  (ZESTRIL ) 10 MG tablet Take 1 tablet (10 mg total) by mouth daily. 90 tablet 4   Multiple Vitamin (MULTIVITAMIN ADULT) TABS Take 1 tablet by mouth daily.     omeprazole  (PRILOSEC) 40 MG capsule Take 1 capsule (40 mg total) by mouth daily. For 3 weeks then as needed 30 capsule 3   Peppermint Oil (IBGARD PO) Take 2 tablets by mouth daily as needed (diarrhea).     Semaglutide , 1 MG/DOSE, (OZEMPIC , 1 MG/DOSE,) 4 MG/3ML SOPN INJECT 1 MG  ONCE A WEEK AS DIRECTED 9 mL 0   nortriptyline (PAMELOR) 10 MG capsule Take nortriptyline 10 mg nightly for 1 week, then increase 20 mg nightly and continue this dose.     predniSONE  (STERAPRED UNI-PAK 48 TAB) 5 MG (48) TBPK tablet Take by mouth.     No facility-administered medications prior to visit.     Per HPI unless specifically indicated in ROS section below Review of Systems  Objective:  BP 112/82   Pulse 88   Temp 97.9 F (36.6 C) (Oral)   Ht 5' 8 (1.727 m)   Wt 281 lb 6 oz (127.6 kg)   LMP 07/10/2020   SpO2 94%   BMI 42.78 kg/m   Wt Readings from Last 3 Encounters:  01/03/24 281 lb 6 oz (127.6 kg)  12/23/23 283 lb 4.7 oz (128.5 kg)  11/02/23 283 lb 4 oz (128.5 kg)      Physical Exam Vitals and nursing note reviewed.  Constitutional:      Appearance: Normal appearance. She is not ill-appearing.     Comments: Ambulates with crutches  Musculoskeletal:        General: Swelling and tenderness present. Normal range of motion.     Right lower leg: No edema.     Left lower leg: No edema.     Comments:  Wearing knee immobilizer Swollen varicose veins to L>R LE  Skin:    General: Skin is warm and dry.  Neurological:     Mental Status: She is alert.       Results for orders placed or performed in visit on 11/02/23  POCT glycosylated hemoglobin (Hb A1C)   Collection Time: 11/02/23  8:17 AM  Result Value Ref Range   Hemoglobin A1C 5.6 4.0 - 5.6 %   HbA1c POC (<> result, manual entry)     HbA1c, POC (prediabetic range)     HbA1c, POC (controlled diabetic range)    Comprehensive metabolic panel with GFR   Collection Time: 11/02/23  8:35 AM  Result Value Ref Range   Sodium 141 135 - 145 mEq/L   Potassium 4.2 3.5 - 5.1 mEq/L   Chloride 102 96 - 112 mEq/L   CO2 32 19 - 32 mEq/L   Glucose, Bld 76 70 - 99 mg/dL   BUN 15 6 - 23 mg/dL   Creatinine, Ser 8.96 0.40 - 1.20 mg/dL   Total Bilirubin 0.6 0.2 - 1.2 mg/dL   Alkaline Phosphatase 67 39 - 117 U/L   AST 24 0 -  37 U/L   ALT 35 0 - 35 U/L   Total Protein 7.3 6.0 - 8.3 g/dL   Albumin  4.1 3.5 - 5.2 g/dL   GFR 34.69 >39.99 mL/min   Calcium  9.3 8.4 - 10.5 mg/dL  CBC with Differential/Platelet   Collection Time: 11/02/23  8:35 AM  Result Value Ref Range  WBC 10.0 4.0 - 10.5 K/uL   RBC 4.31 3.87 - 5.11 Mil/uL   Hemoglobin 14.2 12.0 - 15.0 g/dL   HCT 58.0 63.9 - 53.9 %   MCV 97.2 78.0 - 100.0 fl   MCHC 33.8 30.0 - 36.0 g/dL   RDW 87.8 88.4 - 84.4 %   Platelets 296.0 150.0 - 400.0 K/uL   Neutrophils Relative % 64.8 43.0 - 77.0 %   Lymphocytes Relative 26.7 12.0 - 46.0 %   Monocytes Relative 6.4 3.0 - 12.0 %   Eosinophils Relative 1.7 0.0 - 5.0 %   Basophils Relative 0.4 0.0 - 3.0 %   Neutro Abs 6.5 1.4 - 7.7 K/uL   Lymphs Abs 2.7 0.7 - 4.0 K/uL   Monocytes Absolute 0.6 0.1 - 1.0 K/uL   Eosinophils Absolute 0.2 0.0 - 0.7 K/uL   Basophils Absolute 0.0 0.0 - 0.1 K/uL  Lipase   Collection Time: 11/02/23  8:35 AM  Result Value Ref Range   Lipase 24.0 11.0 - 59.0 U/L  Vitamin B12   Collection Time: 11/02/23  8:35 AM  Result Value Ref Range   Vitamin B-12 683 211 - 911 pg/mL    Assessment & Plan:   Problem List Items Addressed This Visit     Bleeding from varicose veins of left lower extremity   Episode of varicose vein bleed after nicking leg. Has not been using compression stockings.  Requests VVS eval - referral placed.       Relevant Orders   Ambulatory referral to Vascular Surgery   Injury of left knee - Primary   L knee injury after fall.  Possible proximal fibular fracture by xrays at ortho office. MRI showing ACL tear as well as high grade contusion with tear of MCL and medial meniscus.  Will await ortho recommendations tomorrow.         No orders of the defined types were placed in this encounter.   Orders Placed This Encounter  Procedures   Ambulatory referral to Vascular Surgery    Referral Priority:   Routine    Referral Type:   Surgical    Referral Reason:    Specialty Services Required    Requested Specialty:   Vascular Surgery    Number of Visits Requested:   1    Patient Instructions  Continue treatment pan  with knee immobilizer  Will await ortho recommendations tomorrow.  I will also refer you to vein doctor for evaluation of varicosities.   Follow up plan: Return if symptoms worsen or fail to improve.  Anton Blas, MD

## 2024-01-03 NOTE — Assessment & Plan Note (Signed)
 Episode of varicose vein bleed after nicking leg. Has not been using compression stockings.  Requests VVS eval - referral placed.

## 2024-01-03 NOTE — Assessment & Plan Note (Signed)
 L knee injury after fall.  Possible proximal fibular fracture by xrays at ortho office. MRI showing ACL tear as well as high grade contusion with tear of MCL and medial meniscus.  Will await ortho recommendations tomorrow.

## 2024-01-04 DIAGNOSIS — M25462 Effusion, left knee: Secondary | ICD-10-CM | POA: Diagnosis not present

## 2024-01-11 DIAGNOSIS — S83412D Sprain of medial collateral ligament of left knee, subsequent encounter: Secondary | ICD-10-CM | POA: Diagnosis not present

## 2024-01-14 DIAGNOSIS — S83412D Sprain of medial collateral ligament of left knee, subsequent encounter: Secondary | ICD-10-CM | POA: Diagnosis not present

## 2024-01-25 DIAGNOSIS — S83242D Other tear of medial meniscus, current injury, left knee, subsequent encounter: Secondary | ICD-10-CM | POA: Insufficient documentation

## 2024-01-25 DIAGNOSIS — S83412D Sprain of medial collateral ligament of left knee, subsequent encounter: Secondary | ICD-10-CM | POA: Diagnosis not present

## 2024-01-25 DIAGNOSIS — S83512D Sprain of anterior cruciate ligament of left knee, subsequent encounter: Secondary | ICD-10-CM | POA: Diagnosis not present

## 2024-01-26 DIAGNOSIS — S83412D Sprain of medial collateral ligament of left knee, subsequent encounter: Secondary | ICD-10-CM | POA: Diagnosis not present

## 2024-01-27 ENCOUNTER — Other Ambulatory Visit (INDEPENDENT_AMBULATORY_CARE_PROVIDER_SITE_OTHER): Payer: Self-pay | Admitting: Nurse Practitioner

## 2024-01-27 DIAGNOSIS — I83892 Varicose veins of left lower extremities with other complications: Secondary | ICD-10-CM

## 2024-01-28 ENCOUNTER — Other Ambulatory Visit: Payer: Self-pay | Admitting: Family Medicine

## 2024-01-28 DIAGNOSIS — S83412D Sprain of medial collateral ligament of left knee, subsequent encounter: Secondary | ICD-10-CM | POA: Diagnosis not present

## 2024-01-28 DIAGNOSIS — K219 Gastro-esophageal reflux disease without esophagitis: Secondary | ICD-10-CM

## 2024-02-03 ENCOUNTER — Ambulatory Visit: Payer: Self-pay

## 2024-02-03 NOTE — Telephone Encounter (Signed)
 Will see patient then Agree with ER and UC precautions

## 2024-02-03 NOTE — Telephone Encounter (Signed)
 FYI Only or Action Required?: FYI only for provider.  Patient was last seen in primary care on 01/03/2024 by [name], MD.  Called Nurse Triage reporting Emesis.  Symptoms began today.  Interventions attempted: Nothing.  Symptoms are: unchanged.  Triage Disposition: See PCP When Office is Open (Within 3 Days)  Patient/caregiver understands and will follow disposition?: Yes     Patient is calling stating she has been throwing up all morning until nothing is really coming up anymore, no other problems.  Reason for Disposition  [1] MILD vomiting with diarrhea AND [2] present > 5 days  Answer Assessment - Initial Assessment Questions 1. VOMITING SEVERITY: How many times have you vomited in the past 24 hours?      4-5 2. ONSET: When did the vomiting begin?      This am 3. FLUIDS: What fluids or food have you vomited up today? Have you been able to keep any fluids down?     no 4. ABDOMEN PAIN: Are your having any abdomen pain? If Yes : How bad is it and what does it feel like? (e.g., crampy, dull, intermittent, constant)      Top of stomach, moderate  5. DIARRHEA: Is there any diarrhea? If Yes, ask: How many times today?      no 6. CONTACTS: Is there anyone else in the family with the same symptoms?      no 7. CAUSE: What do you think is causing your vomiting?     unknown 8. HYDRATION STATUS: Any signs of dehydration? (e.g., dry mouth [not only dry lips], too weak to stand) When did you last urinate?     no 9. OTHER SYMPTOMS: Do you have any other symptoms? (e.g., fever, headache, vertigo, vomiting blood or coffee grounds, recent head injury)     denies 10. PREGNANCY: Is there any chance you are pregnant? When was your last menstrual period?       na  Protocols used: Vomiting-A-AH

## 2024-02-04 ENCOUNTER — Encounter: Payer: Self-pay | Admitting: Family Medicine

## 2024-02-04 ENCOUNTER — Ambulatory Visit (INDEPENDENT_AMBULATORY_CARE_PROVIDER_SITE_OTHER): Admitting: Family Medicine

## 2024-02-04 VITALS — BP 128/68 | HR 93 | Temp 98.0°F | Ht 68.0 in | Wt 282.2 lb

## 2024-02-04 DIAGNOSIS — K219 Gastro-esophageal reflux disease without esophagitis: Secondary | ICD-10-CM | POA: Diagnosis not present

## 2024-02-04 DIAGNOSIS — S83412D Sprain of medial collateral ligament of left knee, subsequent encounter: Secondary | ICD-10-CM | POA: Diagnosis not present

## 2024-02-04 DIAGNOSIS — R1013 Epigastric pain: Secondary | ICD-10-CM | POA: Diagnosis not present

## 2024-02-04 MED ORDER — FAMOTIDINE 20 MG PO TABS: 20.0000 mg | ORAL_TABLET | Freq: Two times a day (BID) | ORAL | 0 refills | Status: AC

## 2024-02-04 NOTE — Patient Instructions (Addendum)
 Continue your omeprazole   Add famotidine  (prescription pepcid ) 20 mg twice daily (am and pm)  Consider elevating the head of your bed or using a wedge   If symptoms worsen (nausea/ vomiting / abdominal pain) call asap or if severe go to the hospital   Hold your next semaglutide  dose   Follow up with Dr KANDICE in 1-2 weeks

## 2024-02-04 NOTE — Assessment & Plan Note (Signed)
 Intermittent  Associated with heartburn and vomiting at times Some tenderness today  See a/p for GERD  Normal lipase in may   Recommend  Continue omeprazole  40 mg daily  Add famotidine  20 mg bid Hold glp-1 1 week Follow up with pcp   Call back and Er precautions noted in detail today

## 2024-02-04 NOTE — Assessment & Plan Note (Signed)
 Despite omeprazole  40 mg daily pt has symptoms 2-3 times per week/ worse at night  Yesterday became severe (like the past) with vomiting through the day  Also some epigastric pain / improved today Improved now -reassuring exam and vitals  Reviewed last pcp noted  Reviewed labs from may-including normal lipase  Noted pt is on ozempic  (do wonder if this is producing some gastroparesis)   Instructed to continue omeprazole  40 mg daily  Avoid triggers including carbonation (handout given), avoid nsaids (she is)  Add famotidine  20 mg bid for extra acid control Elevate head of bed if able  Hold GLP dose 1 week Monitor closely Follow up with pcp 1 week or sooner  Ultimately may need GI referral   Call back and Er precautions noted in detail today

## 2024-02-04 NOTE — Progress Notes (Signed)
 Subjective:    Patient ID: Cassandra [name], female    DOB: December 13, 1975, 48 y.o.   MRN: [id number]  HPI  Wt Readings from Last 3 Encounters:  02/04/24 282 lb 4 oz (128 kg)  01/03/24 281 lb 6 oz (127.6 kg)  12/23/23 283 lb 4.7 oz (128.5 kg)   42.92 kg/m  Vitals:   02/04/24 1154  BP: 128/68  Pulse: 93  Temp: 98 F (36.7 C)  SpO2: 96%   48 yo pt of Dr KANDICE presents with c/o Nausea and vomiting   Pmx notable for  GERD-on omeprazole  40 mg  Chronic diarrhea -on colestid  in past / recently not a problem  Smoking occasionally  DM2 on semaglutide   HIV -treated    Struggling with acid reflux -on omeprazole  40  Was so bad that she was vomiting in beginning  Still has flare 2-3 times per week but generally not vomiting  Wakes her up - severe burning   Burning in throat   Took her ozempic  on Saturday night   Is eating less in general Tries to avoid pizza  Also dark colored drinks like cola   Some abdominal discomfort  Upper abdomen  Went to ER for abd pIN (2024)- ? Diverticular ?  No blood in stool No dark colored stool     Lab Results  Component Value Date   HGBA1C 5.6 11/02/2023   HGBA1C 5.6 04/26/2023   HGBA1C 5.1 05/25/2022    . 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   Migraine 06/30/2019   Vitamin B12 deficiency 12/31/2018   GERD (gastroesophageal reflux  disease) 12/27/2018   Polyp of descending colon    Chronic diarrhea 04/04/2018   Closed compression fracture of L1 vertebra (HCC) 07/05/2016   Lumbar pain with radiation down both legs 12/20/2015   Health maintenance examination 05/25/2014   Right-sided headache 11/27/2013   Ulnar neuropathy 06/28/2013   Hypertension 09/14/2012   HIV (human immunodeficiency virus infection) (HCC) 01/04/2012   Chronic venous insufficiency of lower extremity 01/01/2012   MDD (major depressive disorder), recurrent episode, moderate (HCC) 09/02/2011   Body mass index (BMI) of 40.0 to 44.9 in adult [facility])    Ex-smoker    Type 2 diabetes mellitus with other specified complication (HCC)    Past Medical History:  Diagnosis Date   Allergy    Allergy to animal dander    cats and dogs   Anxiety    Arthritis    Back   Complication of anesthesia    Pt does not want an epidural, have had reactions to that medication   Constipation    alternating from constipation to diarrhea   COVID-19 07/12/2020   Depression 2017   Diabetes mellitus without complication (HCC) 02/27/2014   Type II   Gallstones 06/2016   by xray -  surgery to remove   Generalized headaches    frequent   GERD (gastroesophageal reflux disease) 2020   History of abnormal Pap smear    remote   History of gestational diabetes    first 2 pregnancies   History of kidney stones    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HCC) 12/31/2011   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    Hypertension    IBS (irritable bowel syndrome)    Morbid obesity (HCC)    Periodontal disease 08/2011   currently getting dental work   Tobacco use    Past Surgical History:  Procedure Laterality Date   ABDOMINAL HYSTERECTOMY  March 2022   BIOPSY  07/04/2018   Procedure: BIOPSY;  Surgeon: Eda Iha, MD;  Location: [facility];  Service: Gastroenterology;;   CESAREAN SECTION  [phone]   x2   CHOLECYSTECTOMY  2018   COLONOSCOPY WITH PROPOFOL  N/A 07/04/2018   for  chronic diarrhea after cholecystectomy started on colestipol  with benefit ([place]). Thought she has IBS-D. 2 TA, rpt 5 yrs   CYSTOSCOPY N/A 08/20/2020   Procedure: CYSTOSCOPY;  Surgeon: Izell Harari, MD;  Location: [facility] OR;  Service: Gynecology;  Laterality: N/A;   ENDOMETRIAL BIOPSY  01/25/2020       HYSTERECTOMY ABDOMINAL WITH SALPINGECTOMY Bilateral 08/20/2020   done for heavy bleed and pelvic pain - pathology showed CIN1 of cervix Cassandra [name], Harari, MD)   HYSTEROSCOPY WITH D & C N/A 01/05/2018   [facility] for heavy bleeding, IUD removed - DILATATION AND CURETTAGE /HYSTEROSCOPY WITH MINERVA ABLATION; Fredirick Cassandra RAMAN, MD   INTRAUTERINE DEVICE INSERTION     Mirena   POLYPECTOMY  07/04/2018   Procedure: POLYPECTOMY;  Surgeon: Eda Iha, MD;  Location: [facility];  Service: Gastroenterology;;   TOE SURGERY Left 2022   TOTAL LAPAROSCOPIC HYSTERECTOMY WITH SALPINGECTOMY Bilateral 08/20/2020   Procedure: ATTEMPTED TOTAL LAPAROSCOPIC HYSTERECTOMY WITH SALPINGECTOMY;  Surgeon: Izell Harari, MD;  Location[facility];  Service: Gynecology;  Laterality: Bilateral;   TUBAL LIGATION  2009   WISDOM TOOTH EXTRACTION     x 4   Social History   Tobacco Use   Smoking status: Some Days    Current packs/day: 0.10    Average packs/day: 0.1 packs/day for 23.0 years (2.3 ttl pk-yrs)    Types: Cigarettes   Smokeless tobacco: Never   Tobacco comments:    1 every now and then  Vaping Use   Vaping status: Never Used  Substance Use Topics   Alcohol use: Yes    Alcohol/week: 0.0 standard drinks of alcohol    Comment: occasional   Drug use: No   Family History  Problem Relation Age of Onset   Hypertension Mother    Depression Mother    Hypertension Father    Appendicitis Father    Atrial fibrillation Father    Hearing loss Father    Diabetes Maternal Grandmother        s/p amputation   Hyperlipidemia Maternal Grandmother    Hypertension Maternal Grandmother    Stroke Maternal  Grandmother    Hyperlipidemia Maternal Grandfather    Hypertension Maternal Grandfather    Skin cancer Maternal Grandfather    Diabetes Maternal Grandfather    Cancer Maternal Grandfather    COPD Maternal Grandfather    Seizures Paternal Grandmother 88       deceased   Early death Paternal Grandmother    Coronary artery disease Paternal Grandfather 50       MI   Early death  Paternal Grandfather    ADD / ADHD Brother    ADD / ADHD Son    Breast cancer Neg Hx    Colon cancer Neg Hx    Colon polyps Neg Hx    Esophageal cancer Neg Hx    Rectal cancer Neg Hx    Stomach cancer Neg Hx    Allergies  Allergen Reactions   Metformin  And Related Diarrhea   Nickel Rash   Current Outpatient Medications on File Prior to Visit  Medication Sig Dispense Refill   Accu-Chek FastClix Lancets MISC 1 each by Does not apply route as directed. Use as directed to check blood sugar twice daily as needed. E11.8 102 each 3   amLODipine  (NORVASC ) 5 MG tablet Take 1 tablet (5 mg total) by mouth daily. 90 tablet 4   BIKTARVY  50-200-25 MG TABS tablet Take 1 tablet by mouth at bedtime. 90 tablet 4   cetirizine (ZYRTEC) 10 MG tablet Take 10 mg by mouth at bedtime.     colestipol  (COLESTID ) 1 g tablet TAKE 2 TABLETS (2 G TOTAL) BY MOUTH EVERY MONDAY, WEDNESDAY, AND FRIDAY. 270 tablet 0   cyanocobalamin  (VITAMIN B12) 1000 MCG tablet Take 1 tablet (1,000 mcg total) by mouth daily.     cyclobenzaprine  (FLEXERIL ) 5 MG tablet Take 1 tablet (5 mg total) by mouth 2 (two) times daily as needed (headache (sedation precautions)). 30 tablet 1   EPINEPHrine 0.3 mg/0.3 mL IJ SOAJ injection Inject into the muscle once as needed.     escitalopram  (LEXAPRO ) 20 MG tablet Take 1 tablet (20 mg total) by mouth daily. 90 tablet 4   glucose blood (ACCU-CHEK GUIDE) test strip 1 each by Other route as needed for other. Use as instructed to check blood sugar 100 each 0   lisinopril  (ZESTRIL ) 10 MG tablet Take 1 tablet (10 mg total) by  mouth daily. 90 tablet 4   omeprazole  (PRILOSEC) 40 MG capsule TAKE 1 CAPSULE (40 MG TOTAL) BY MOUTH DAILY. FOR 3 WEEKS THEN AS NEEDED 90 capsule 0   Semaglutide , 1 MG/DOSE, (OZEMPIC , 1 MG/DOSE,) 4 MG/3ML SOPN INJECT 1 MG ONCE A WEEK AS DIRECTED 9 mL 0   No current facility-administered medications on file prior to visit.    Review of Systems  Constitutional:  Positive for fatigue. Negative for activity change, appetite change, fever and unexpected weight change.  HENT:  Negative for congestion, ear pain, rhinorrhea, sinus pressure and sore throat.   Eyes:  Negative for pain, redness and visual disturbance.  Respiratory:  Negative for cough, shortness of breath and wheezing.   Cardiovascular:  Negative for chest pain and palpitations.  Gastrointestinal:  Positive for nausea and vomiting. Negative for abdominal pain, blood in stool, constipation and diarrhea.       [place]  Epigastric pain    All symptoms are improved today  Endocrine: Negative for polydipsia and polyuria.  Genitourinary:  Negative for dysuria, frequency and urgency.  Musculoskeletal:  Negative for arthralgias, back pain and myalgias.  Skin:  Negative for pallor and rash.  Allergic/Immunologic: Negative for environmental allergies.  Neurological:  Negative for dizziness, syncope and headaches.  Hematological:  Negative for adenopathy. Does not bruise/bleed easily.  Psychiatric/Behavioral:  Negative for decreased concentration and dysphoric mood. The patient is not nervous/anxious.        Objective:   Physical Exam Constitutional:      General: She is not in acute distress.    Appearance: Normal appearance. She is well-developed. She is obese. She  is not ill-appearing or diaphoretic.  HENT:     Head: Normocephalic and atraumatic.  Eyes:     General: No scleral icterus.    Conjunctiva/sclera: Conjunctivae normal.     Pupils: Pupils are equal, round, and reactive to light.  Cardiovascular:     Rate and Rhythm:  Normal rate and regular rhythm.     Heart sounds: Normal heart sounds.  Pulmonary:     Effort: Pulmonary effort is normal. No respiratory distress.     Breath sounds: Normal breath sounds. No wheezing or rales.  Abdominal:     General: Abdomen is protuberant. Bowel sounds are normal. There is no distension.     Palpations: Abdomen is soft. There is no fluid wave, hepatomegaly, splenomegaly, mass or pulsatile mass.     Tenderness: There is abdominal tenderness in the right upper quadrant, epigastric area and left upper quadrant. There is no right CVA tenderness, left CVA tenderness, guarding or rebound.  Musculoskeletal:     Cervical back: Normal range of motion and neck supple.  Lymphadenopathy:     Cervical: No cervical adenopathy.  Skin:    General: Skin is warm and dry.     Coloration: Skin is not jaundiced or pale.     Findings: No bruising or erythema.  Neurological:     Mental Status: She is alert.  Psychiatric:     Comments: Blunted affect            Assessment & Plan:   Problem List Items Addressed This Visit       Digestive   GERD (gastroesophageal reflux disease) - Primary   Despite omeprazole  40 mg daily pt has symptoms 2-3 times per week/ worse at night  Yesterday became severe (like the past) with vomiting through the day  Also some epigastric pain / improved today Improved now -reassuring exam and vitals  Reviewed last pcp noted  Reviewed labs from may-including normal lipase  Noted pt is on ozempic  (do wonder if this is producing some gastroparesis)   Instructed to continue omeprazole  40 mg daily  Avoid triggers including carbonation (handout given), avoid nsaids (she is)  Add famotidine  20 mg bid for extra acid control Elevate head of bed if able  Hold GLP dose 1 week Monitor closely Follow up with pcp 1 week or sooner  Ultimately may need GI referral   Call back and Er precautions noted in detail today        Relevant Medications   famotidine   (PEPCID ) 20 MG tablet     Other   Epigastric pain   Intermittent  Associated with heartburn and vomiting at times Some tenderness today  See a/p for GERD  Normal lipase in may   Recommend  Continue omeprazole  40 mg daily  Add famotidine  20 mg bid Hold glp-1 1 week Follow up with pcp   Call back and Er precautions noted in detail today

## 2024-02-09 ENCOUNTER — Ambulatory Visit (INDEPENDENT_AMBULATORY_CARE_PROVIDER_SITE_OTHER): Payer: Self-pay

## 2024-02-09 ENCOUNTER — Ambulatory Visit (INDEPENDENT_AMBULATORY_CARE_PROVIDER_SITE_OTHER): Admitting: Obstetrics and Gynecology

## 2024-02-09 ENCOUNTER — Other Ambulatory Visit (HOSPITAL_COMMUNITY)
Admission: RE | Admit: 2024-02-09 | Discharge: 2024-02-09 | Disposition: A | Discharge status: Home / Self Care | Source: Ambulatory Visit | Attending: Obstetrics and Gynecology | Admitting: Obstetrics and Gynecology

## 2024-02-09 ENCOUNTER — Encounter: Payer: Self-pay | Admitting: Obstetrics and Gynecology

## 2024-02-09 ENCOUNTER — Ambulatory Visit (INDEPENDENT_AMBULATORY_CARE_PROVIDER_SITE_OTHER): Payer: Self-pay | Admitting: Nurse Practitioner

## 2024-02-09 ENCOUNTER — Encounter (INDEPENDENT_AMBULATORY_CARE_PROVIDER_SITE_OTHER): Payer: Self-pay | Admitting: Nurse Practitioner

## 2024-02-09 VITALS — BP 118/80 | HR 86 | Ht 68.0 in | Wt 280.0 lb

## 2024-02-09 VITALS — BP 129/84 | HR 82 | Ht 68.0 in | Wt 288.0 lb

## 2024-02-09 DIAGNOSIS — M542 Cervicalgia: Secondary | ICD-10-CM | POA: Diagnosis not present

## 2024-02-09 DIAGNOSIS — N870 Mild cervical dysplasia: Secondary | ICD-10-CM | POA: Insufficient documentation

## 2024-02-09 DIAGNOSIS — N941 Unspecified dyspareunia: Secondary | ICD-10-CM | POA: Diagnosis not present

## 2024-02-09 DIAGNOSIS — N952 Postmenopausal atrophic vaginitis: Secondary | ICD-10-CM | POA: Diagnosis not present

## 2024-02-09 DIAGNOSIS — I83892 Varicose veins of left lower extremities with other complications: Secondary | ICD-10-CM | POA: Diagnosis not present

## 2024-02-09 DIAGNOSIS — Z1331 Encounter for screening for depression: Secondary | ICD-10-CM | POA: Diagnosis not present

## 2024-02-09 DIAGNOSIS — N898 Other specified noninflammatory disorders of vagina: Secondary | ICD-10-CM

## 2024-02-09 DIAGNOSIS — S83242D Other tear of medial meniscus, current injury, left knee, subsequent encounter: Secondary | ICD-10-CM

## 2024-02-09 DIAGNOSIS — R519 Headache, unspecified: Secondary | ICD-10-CM | POA: Diagnosis not present

## 2024-02-09 DIAGNOSIS — G8929 Other chronic pain: Secondary | ICD-10-CM | POA: Diagnosis not present

## 2024-02-09 DIAGNOSIS — Z90710 Acquired absence of both cervix and uterus: Secondary | ICD-10-CM

## 2024-02-09 DIAGNOSIS — M545 Low back pain, unspecified: Secondary | ICD-10-CM | POA: Diagnosis not present

## 2024-02-09 DIAGNOSIS — I10 Essential (primary) hypertension: Secondary | ICD-10-CM | POA: Diagnosis not present

## 2024-02-09 DIAGNOSIS — S83412D Sprain of medial collateral ligament of left knee, subsequent encounter: Secondary | ICD-10-CM | POA: Diagnosis not present

## 2024-02-09 MED ORDER — FLUCONAZOLE 150 MG PO TABS: 150.0000 mg | ORAL_TABLET | ORAL | 0 refills | Status: DC

## 2024-02-09 MED ORDER — MOMETASONE FUROATE 0.1 % EX OINT: TOPICAL_OINTMENT | CUTANEOUS | 1 refills | Status: AC

## 2024-02-09 NOTE — Progress Notes (Signed)
 Patient presents for Annual.  Hysterectomy  Last pap: 01/25/2020  Mammogram: 02/05/2022 would like in [place] area STD Screening: Declines  CC: pain with intercourse. And vaginal dryness  Pt consents to female student in exam room.

## 2024-02-09 NOTE — Progress Notes (Signed)
 Obstetrics and Gynecology New Patient Evaluation  Appointment Date: 02/09/2024  OBGYN Clinic: [facility] for [facility]   Primary Care Provider: [name]  Referring Provider: [name], MD  Chief Complaint: vaginal dryness, dyspaurenia for approximately 1 year.   History of Present Illness: Cassandra [name] is a 48 y.o.  [phone] (Patient's last menstrual period was 07/10/2020.), seen for the above chief complaint. 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   For approximately the past year, she's had vaginal dryness. She also notes hot flashes, night sweats. No itching, irritation or discharge.   Review of Systems: Pertinent items are noted in HPI.   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   Migraine 06/30/2019   Vitamin B12 deficiency 12/31/2018   GERD (gastroesophageal reflux disease) 12/27/2018   Polyp of descending colon    Chronic diarrhea 04/04/2018   Closed compression fracture of L1 vertebra (HCC) 07/05/2016   Lumbar pain with radiation down both legs 12/20/2015   Health maintenance examination 05/25/2014   Right-sided headache 11/27/2013   Ulnar neuropathy 06/28/2013   Hypertension 09/14/2012   HIV (human immunodeficiency virus infection) (HCC) 01/04/2012   Chronic venous insufficiency of lower extremity 01/01/2012   MDD (major depressive disorder), recurrent episode, moderate (HCC) 09/02/2011   Body mass index (BMI) of 40.0 to 44.9 in adult [facility])    Ex-smoker    Type 2 diabetes mellitus with other  specified complication (HCC)    Past Medical History:  Past Medical History:  Diagnosis Date   Allergy    Allergy to animal dander    cats and dogs   Anxiety    Arthritis    Back   Complication of anesthesia    Pt does not want an epidural, have had reactions to that medication   Constipation    alternating from constipation to diarrhea   COVID-19 07/12/2020   Depression 2017   Diabetes mellitus without complication (HCC) 02/27/2014   Type II   Gallstones 06/2016   by xray - surgery to remove   Generalized headaches    frequent   GERD (gastroesophageal reflux disease) 2020   History of abnormal Pap smear    remote   History of gestational diabetes    first 2 pregnancies   History of kidney stones    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HCC) 12/31/2011   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    Hypertension    IBS (irritable bowel syndrome)    Morbid obesity (HCC)    Periodontal disease 08/2011   currently getting dental work   Tobacco use    Past Surgical History:  Past Surgical History:  Procedure Laterality Date   ABDOMINAL HYSTERECTOMY  March 2022   BIOPSY  07/04/2018   Procedure: BIOPSY;  Surgeon: Eda Iha, MD;  Location: [facility];  Service: Gastroenterology;;   CESAREAN SECTION  [phone]   x2   CHOLECYSTECTOMY  2018   COLONOSCOPY WITH PROPOFOL  N/A 07/04/2018   for chronic diarrhea after cholecystectomy started on colestipol  with benefit ([facility]). Thought she has  IBS-D. 2 TA, rpt 5 yrs   CYSTOSCOPY N/A 08/20/2020   Procedure: CYSTOSCOPY;  Surgeon: Izell Harari, MD;  Location: [facility] OR;  Service: Gynecology;  Laterality: N/A;   ENDOMETRIAL BIOPSY  01/25/2020       HYSTERECTOMY ABDOMINAL WITH SALPINGECTOMY Bilateral 08/20/2020   done for heavy bleed and pelvic pain - pathology showed CIN1 of cervix Eleanora, Harari, MD)   HYSTEROSCOPY WITH D & C N/A 01/05/2018   [place] for heavy bleeding, IUD removed - DILATATION AND CURETTAGE /HYSTEROSCOPY WITH  MINERVA ABLATION; Fredirick Glenys RAMAN, MD   INTRAUTERINE DEVICE INSERTION     Mirena   POLYPECTOMY  07/04/2018   Procedure: POLYPECTOMY;  Surgeon: Eda Iha, MD;  Location: [facility];  Service: Gastroenterology;;   TOE SURGERY Left 2022   TOTAL LAPAROSCOPIC HYSTERECTOMY WITH SALPINGECTOMY Bilateral 08/20/2020   Procedure: ATTEMPTED TOTAL LAPAROSCOPIC HYSTERECTOMY WITH SALPINGECTOMY;  Surgeon: Izell Harari, MD;  Location: [facility] OR;  Service: Gynecology;  Laterality: Bilateral;   TUBAL LIGATION  2009   WISDOM TOOTH EXTRACTION     x 4   Past Obstetrical History:  OB History  Gravida Para Term Preterm AB Living  2 2 2  0 0 2  SAB IAB Ectopic Multiple Live Births  0 0 0 0 2    # Outcome Date GA Lbr Len/2nd Weight Sex Type Anes PTL Lv  2 Term 2009    M CS-Unspec   LIV  1 Term 1999    M CS-Unspec   LIV    Past Gynecological History: As per HPI.  Social History:  Social History   Socioeconomic History   Marital status: Married    Spouse name: Not on file   Number of children: 2   Years of education: Not on file   Highest education level: Associate degree: occupational, Scientist, product/process development, or vocational program  Occupational History   Occupation: CSR  Tobacco Use   Smoking status: Some Days    Current packs/day: 0.10    Average packs/day: 0.1 packs/day for 23.0 years (2.3 ttl pk-yrs)    Types: Cigarettes   Smokeless tobacco: Never   Tobacco comments:    1 every now and then  Vaping Use   Vaping status: Never Used  Substance and Sexual Activity   Alcohol use: Yes    Alcohol/week: 0.0 standard drinks of alcohol    Comment: occasional   Drug use: No   Sexual activity: Yes    Partners: Male    Birth control/protection: Surgical  Other Topics Concern   Not on file  Social History Narrative   Lives with husband and 2 children, no pets   Occupation: call center rep   Edu: some college   Activity: no regular exercise.  Tries to walk around building.   Diet: 1 mt dew in am,  rest water, fruits/vegetables daily    Social Drivers of Health   Financial Resource Strain: Medium Risk (10/28/2023)   Received from [facility]   Overall Financial Resource Strain (CARDIA)    Difficulty of Paying Living Expenses: Somewhat hard  Food Insecurity: Food Insecurity Present (10/28/2023)   Received from [facility]   Hunger Vital Sign    Within the past 12 months, you worried that your food would run out before you got the money to buy more.: Sometimes true    Within the past 12 months, the food you bought just didn't last and you didn't have money to get more.: Sometimes true  Transportation Needs: No  Transportation Needs (10/28/2023)   Received from [facility] - Transportation    In the past 12 months, has lack of transportation kept you from medical appointments or from getting medications?: No    Lack of Transportation (Non-Medical): No  Physical Activity: Insufficiently Active (08/02/2023)   Exercise Vital Sign    Days of Exercise per Week: 1 day    Minutes of Exercise per Session: 20 min  Stress: Stress Concern Present (08/02/2023)   [employer] of Occupational Health - Occupational Stress Questionnaire    Feeling of Stress : To some extent  Social Connections: Moderately Integrated (08/02/2023)   Social Connection and Isolation Panel    Frequency of Communication with Friends and Family: Once a week    Frequency of Social Gatherings with Friends and Family: Once a week    Attends Religious Services: 1 to 4 times per year    Active Member of [employer] or Organizations: Yes    Attends Engineer, structural: More than 4 times per year    Marital Status: Married  Catering manager Violence: Not on file   Family History:  Family History  Problem Relation Age of Onset   Hypertension Mother    Depression Mother    Hypertension Father    Appendicitis Father    Atrial fibrillation Father     Hearing loss Father    Diabetes Maternal Grandmother        s/p amputation   Hyperlipidemia Maternal Grandmother    Hypertension Maternal Grandmother    Stroke Maternal Grandmother    Hyperlipidemia Maternal Grandfather    Hypertension Maternal Grandfather    Skin cancer Maternal Grandfather    Diabetes Maternal Grandfather    Cancer Maternal Grandfather    COPD Maternal Grandfather    Seizures Paternal Grandmother 27       deceased   Early death Paternal Grandmother    Coronary artery disease Paternal Grandfather 31       MI   Early death Paternal Grandfather    ADD / ADHD Brother    ADD / ADHD Son    Breast cancer Neg Hx    Colon cancer Neg Hx    Colon polyps Neg Hx    Esophageal cancer Neg Hx    Rectal cancer Neg Hx    Stomach cancer Neg Hx    Medications Dreanna M. Saulsbury had no medications administered during this visit. Current Outpatient Medications  Medication Sig Dispense Refill   Accu-Chek FastClix Lancets MISC 1 each by Does not apply route as directed. Use as directed to check blood sugar twice daily as needed. E11.8 102 each 3   amLODipine  (NORVASC ) 5 MG tablet Take 1 tablet (5 mg total) by mouth daily. 90 tablet 4   BIKTARVY  50-200-25 MG TABS tablet Take 1 tablet by mouth at bedtime. 90 tablet 4   cetirizine (ZYRTEC) 10 MG tablet Take 10 mg by mouth at bedtime.     colestipol  (COLESTID ) 1 g tablet TAKE 2 TABLETS (2 G TOTAL) BY MOUTH EVERY MONDAY, WEDNESDAY, AND FRIDAY. 270 tablet 0   cyanocobalamin  (VITAMIN B12) 1000 MCG tablet Take 1 tablet (1,000 mcg total) by mouth daily.     cyclobenzaprine  (FLEXERIL ) 5 MG tablet Take 1 tablet (5 mg total) by mouth 2 (two) times daily as needed (headache (sedation precautions)). 30 tablet 1   EPINEPHrine 0.3 mg/0.3 mL IJ SOAJ injection Inject into the muscle once as needed.     escitalopram  (LEXAPRO )  20 MG tablet Take 1 tablet (20 mg total) by mouth daily. 90 tablet 4   famotidine  (PEPCID ) 20 MG tablet Take 1 tablet (20 mg  total) by mouth 2 (two) times daily. 60 tablet 0   glucose blood (ACCU-CHEK GUIDE) test strip 1 each by Other route as needed for other. Use as instructed to check blood sugar 100 each 0   lisinopril  (ZESTRIL ) 10 MG tablet Take 1 tablet (10 mg total) by mouth daily. 90 tablet 4   omeprazole  (PRILOSEC) 40 MG capsule TAKE 1 CAPSULE (40 MG TOTAL) BY MOUTH DAILY. FOR 3 WEEKS THEN AS NEEDED 90 capsule 0   Semaglutide , 1 MG/DOSE, (OZEMPIC , 1 MG/DOSE,) 4 MG/3ML SOPN INJECT 1 MG ONCE A WEEK AS DIRECTED 9 mL 0   No current facility-administered medications for this visit.   Allergies Metformin  and related and Nickel  Physical Exam:  BP 129/84   Pulse 82   Ht 5' 8 (1.727 m)   Wt 288 lb (130.6 kg)   LMP 07/10/2020   BMI 43.79 kg/m  Body mass index is 43.79 kg/m. General appearance: Well nourished, well developed female in no acute distress.  Neck:  Supple, normal appearance, and no thyromegaly  Cardiovascular: normal s1 and s2.  No murmurs, rubs or gallops. Respiratory:  Clear to auscultation bilateral. Normal respiratory effort Abdomen: positive bowel sounds and no masses, hernias; diffusely non tender to palpation, non distended Breasts: breasts appear normal, no suspicious masses, no skin or nipple changes or axillary nodes, and normal palpation. Neuro/Psych:  Normal mood and affect.  Skin:  Warm and dry.  Lymphatic:  No inguinal lymphadenopathy.   Pelvic exam chaperoned by CMA Pelvic exam: is limited by body habitus EGBUS: mild atrophy with bilateral erythema, nttp to labia majora Vagina: within normal limits and with no blood or discharge in the vault Cervix: surgically absent Cuff: wnl Adnexa:  normal adnexa and no mass, fullness, tenderness Rectovaginal: deferred  Laboratory: none  Radiology: none  Assessment: patient stable  Plan: 1. History of hysterectomy (Primary)  2. Vaginal dryness ?perimenopause. Labs today, as well as swabs . She hasn't anything OTC. Will tried  with diflucan  x 2 and mometasone  0.1% ointment and RTC in 6 weeks. Pt to do at bedtime x2wks and then 2-3x/week. If resolved then consider vaginal estrogen vs exp management, since this seems more like irritation than vaginal atrophy.   3. Dyspareunia in female  4. Perimenopausal atrophic vaginitis - Cytology - PAP - [facility]SH - Estradiol  - Cervicovaginal ancillary only( [place])  5. Dysplasia of cervix, low grade (CIN 1) - Cytology - PAP  RTC 6-8wks for repeat pelvic.    Future Appointments  Date Time Provider Department Center  02/09/2024  2:30 PM Brown, Fallon E, NP [facility] None  02/16/2024 12:30 PM [name], MD [facility] [id number]  08/31/2024 11:30 AM Fayette Bodily, MD [facility] None    Bebe Izell Raddle MD Attending Center for [facility])

## 2024-02-10 LAB — ESTRADIOL: Estradiol: <5 pg/mL

## 2024-02-10 LAB — CERVICOVAGINAL ANCILLARY ONLY
Bacterial Vaginitis (gardnerella): POSITIVE — AB
Candida Glabrata: NEGATIVE
Candida Vaginitis: NEGATIVE
Comment: NEGATIVE
Comment: NEGATIVE
Comment: NEGATIVE

## 2024-02-10 LAB — FOLLICLE STIMULATING HORMONE: FSH: 113 m[IU]/mL

## 2024-02-11 ENCOUNTER — Ambulatory Visit: Payer: Self-pay | Admitting: Obstetrics and Gynecology

## 2024-02-11 MED ORDER — METRONIDAZOLE 500 MG PO TABS: 500.0000 mg | ORAL_TABLET | Freq: Two times a day (BID) | ORAL | 0 refills | Status: DC

## 2024-02-13 ENCOUNTER — Encounter: Payer: Self-pay | Admitting: Family Medicine

## 2024-02-14 DIAGNOSIS — S83412D Sprain of medial collateral ligament of left knee, subsequent encounter: Secondary | ICD-10-CM | POA: Diagnosis not present

## 2024-02-14 LAB — CYTOLOGY - PAP
Comment: NEGATIVE
Diagnosis: NEGATIVE
High risk HPV: NEGATIVE

## 2024-02-14 NOTE — Telephone Encounter (Signed)
 Looks like this was to follow up on GI symptoms. That needs to be in office right?

## 2024-02-16 ENCOUNTER — Encounter: Payer: Self-pay | Admitting: Family Medicine

## 2024-02-16 ENCOUNTER — Ambulatory Visit: Admitting: Family Medicine

## 2024-02-16 ENCOUNTER — Telehealth (INDEPENDENT_AMBULATORY_CARE_PROVIDER_SITE_OTHER): Admitting: Family Medicine

## 2024-02-16 VITALS — BP 124/86 | HR 96 | Temp 98.5°F | Ht 68.0 in | Wt 280.0 lb

## 2024-02-16 DIAGNOSIS — R1013 Epigastric pain: Secondary | ICD-10-CM

## 2024-02-16 DIAGNOSIS — Z860101 Personal history of adenomatous and serrated colon polyps: Secondary | ICD-10-CM | POA: Diagnosis not present

## 2024-02-16 DIAGNOSIS — E1169 Type 2 diabetes mellitus with other specified complication: Secondary | ICD-10-CM | POA: Diagnosis not present

## 2024-02-16 DIAGNOSIS — K219 Gastro-esophageal reflux disease without esophagitis: Secondary | ICD-10-CM | POA: Diagnosis not present

## 2024-02-16 NOTE — Assessment & Plan Note (Signed)
 She is also due for colonoscopy.

## 2024-02-16 NOTE — Telephone Encounter (Signed)
Seen today virtually

## 2024-02-16 NOTE — Assessment & Plan Note (Addendum)
 No recent vomiting Reviewed dietary measures to control reflux.  Continue pepcid , increase omeprazole  to 40mg  BID. Refer to GI for further evaluation of newly uncontrolled reflux despite daily PPI, with epigastric pain.

## 2024-02-16 NOTE — Assessment & Plan Note (Addendum)
 Intermittent. Descriptive of biliary colic however s/p cholecystectomy 2018.  ?gastritis - increase omeprazole  to 40mg  BID, continue pepcid , continue to hold ozempic .  She will return to office for labwork on Friday (CBC, CMP, lipase).  Will also refer to GI for further evaluation - requests GI office in [place].  Update if new or worsening symptoms.

## 2024-02-16 NOTE — Progress Notes (Signed)
 Ph: [phone] Fax: [phone]   Patient ID: Cassandra [name], female    DOB: Oct 10, 1975, 48 y.o.   MRN: [id number]  Virtual visit completed through MyChart, a video enabled telemedicine application. Due to national recommendations of social distancing due to COVID-19, a virtual visit is felt to be most appropriate for this patient at this time. Reviewed limitations, risks, security and privacy concerns of performing a virtual visit and the availability of in person appointments. I also reviewed that there may be a patient responsible charge related to this service. The patient agreed to proceed.   Patient location: home Provider location:  at [facility], office Persons participating in this virtual visit: patient, provider   If any vitals were documented, they were collected by patient at home unless specified below.    BP 124/86   Pulse 96   Temp 98.5 F (36.9 C) (Oral)   Ht 5' 8 (1.727 m)   Wt 280 lb (127 kg)   LMP 07/10/2020   BMI 42.57 kg/m    CC: reflux and abd pain f/u Subjective:   HPI: Cassandra [name] is a 48 y.o. female presenting on 02/16/2024 for Gastroesophageal Reflux (F/U reflux. )   Saw Dr Randeen 02/04/2024, note reviewed.  Epigastric burning symptoms despite omeprazole  40mg  daily, associated with vomiting initially. Treated with continued PPI, adding pepcid  20mg  BID, and holding Ozempic  x 1 wk.   Ongoing intermittent upper epigastric pain described as burning, associated with fatigue, slowly building over time, then dies down over time - this feels similar to prior gallstones.  She notes pizza, tomatoes, dark sodas, sweet tea trigger symptoms.  No fevers/chills, sharp stabbing pain, boring pain to the back, yellowing of skin or eyes, diarrhea or constipation. No recent vomiting.   She has continued omeprazole  40mg  daily and has continued pepcid  at least once a day with benefit. Continues to hold Ozempic .   H/o cholecystectomy 2018 for  gallstones.  H/o bile salt diarrhea but has not recently needed colestipol .   Last saw Dr Eda LBGI 2021 for chronic diarrhea after cholecystectomy that did improve. Requests referral local to [place] if needed  COLONOSCOPY WITH PROPOFOL  07/04/2018 - for chronic diarrhea after cholecystectomy started on colestipol  with benefit ([facility]). Thought she has IBS-D. 2 TA, rpt 5 yrs     Relevant past medical, surgical, family and social history reviewed and updated as indicated. Interim medical history since our last visit reviewed. Allergies and medications reviewed and updated. Outpatient Medications Prior to Visit  Medication Sig Dispense Refill   Accu-Chek FastClix Lancets MISC 1 each by Does not apply route as directed. Use as directed to check blood sugar twice daily as needed. E11.8 102 each 3   amLODipine  (NORVASC ) 5 MG tablet Take 1 tablet (5 mg total) by mouth daily. 90 tablet 4   BIKTARVY  50-200-25 MG TABS tablet Take 1 tablet by mouth at bedtime. 90 tablet 4   cetirizine (ZYRTEC) 10 MG tablet Take 10 mg by mouth at bedtime.     colestipol  (COLESTID ) 1 g tablet TAKE 2 TABLETS (2 G TOTAL) BY MOUTH EVERY MONDAY, WEDNESDAY, AND FRIDAY. 270 tablet 0   cyanocobalamin  (VITAMIN B12) 1000 MCG tablet Take 1 tablet (1,000 mcg total) by mouth daily.     cyclobenzaprine  (FLEXERIL ) 5 MG tablet Take 1 tablet (5 mg total) by mouth 2 (two) times daily as needed (headache (sedation precautions)). 30 tablet 1   EPINEPHrine 0.3 mg/0.3 mL IJ SOAJ injection Inject into the muscle once  as needed.     escitalopram  (LEXAPRO ) 20 MG tablet Take 1 tablet (20 mg total) by mouth daily. 90 tablet 4   famotidine  (PEPCID ) 20 MG tablet Take 1 tablet (20 mg total) by mouth 2 (two) times daily. 60 tablet 0   fluconazole  (DIFLUCAN ) 150 MG tablet Take 1 tablet (150 mg total) by mouth every 3 (three) days. 2 tablet 0   glucose blood (ACCU-CHEK GUIDE) test strip 1 each by Other route as needed for other. Use as instructed  to check blood sugar 100 each 0   lisinopril  (ZESTRIL ) 10 MG tablet Take 1 tablet (10 mg total) by mouth daily. 90 tablet 4   metroNIDAZOLE  (FLAGYL ) 500 MG tablet Take 1 tablet (500 mg total) by mouth 2 (two) times daily. 14 tablet 0   mometasone  (ELOCON ) 0.1 % ointment Apply liberally to vulva at bedtime x 2 weeks and then 2-3x/week until seen in the office 45 g 1   omeprazole  (PRILOSEC) 40 MG capsule TAKE 1 CAPSULE (40 MG TOTAL) BY MOUTH DAILY. FOR 3 WEEKS THEN AS NEEDED 90 capsule 0   Semaglutide , 1 MG/DOSE, (OZEMPIC , 1 MG/DOSE,) 4 MG/3ML SOPN INJECT 1 MG ONCE A WEEK AS DIRECTED (Patient not taking: Reported on 02/16/2024) 9 mL 0   No facility-administered medications prior to visit.     Per HPI unless specifically indicated in ROS section below Review of Systems Objective:  BP 124/86   Pulse 96   Temp 98.5 F (36.9 C) (Oral)   Ht 5' 8 (1.727 m)   Wt 280 lb (127 kg)   LMP 07/10/2020   BMI 42.57 kg/m   Wt Readings from Last 3 Encounters:  02/16/24 280 lb (127 kg)  02/09/24 280 lb (127 kg)  02/09/24 288 lb (130.6 kg)       Physical exam: Gen: alert, NAD, not ill appearing Pulm: speaks in complete sentences without increased work of breathing Psych: normal mood, normal thought content      Lab Results  Component Value Date   NA 141 11/02/2023   CL 102 11/02/2023   K 4.2 11/02/2023   CO2 32 11/02/2023   BUN 15 11/02/2023   CREATININE 1.03 11/02/2023   GFR 65.30 11/02/2023   CALCIUM  9.3 11/02/2023   ALBUMIN  4.1 11/02/2023   GLUCOSE 76 11/02/2023    Lab Results  Component Value Date   ALT 35 11/02/2023   AST 24 11/02/2023   ALKPHOS 67 11/02/2023   BILITOT 0.6 11/02/2023    Lab Results  Component Value Date   LIPASE 24.0 11/02/2023    Lab Results  Component Value Date   WBC 10.0 11/02/2023   HGB 14.2 11/02/2023   HCT 41.9 11/02/2023   MCV 97.2 11/02/2023   PLT 296.0 11/02/2023    Lab Results  Component Value Date   HGBA1C 5.6 11/02/2023    Assessment  & Plan:   Epigastric pain Assessment & Plan: Intermittent. Descriptive of biliary colic however s/p cholecystectomy 2018.  ?gastritis - increase omeprazole  to 40mg  BID, continue pepcid , continue to hold ozempic .  She will return to office for labwork on Friday (CBC, CMP, lipase).  Will also refer to GI for further evaluation - requests GI office in [place].  Update if new or worsening symptoms.   Orders: -     Ambulatory referral to Gastroenterology; Future -     Comprehensive metabolic panel with GFR; Future -     CBC with Differential/Platelet; Future -     Lipase; Future  Gastroesophageal reflux disease, unspecified whether esophagitis present Assessment & Plan: No recent vomiting Reviewed dietary measures to control reflux.  Continue pepcid , increase omeprazole  to 40mg  BID. Refer to GI for further evaluation of newly uncontrolled reflux despite daily PPI, with epigastric pain.   Orders: -     Ambulatory referral to Gastroenterology; Future -     Comprehensive metabolic panel with GFR; Future -     CBC with Differential/Platelet; Future -     Lipase; Future  Type 2 diabetes mellitus with other specified complication, without long-term current use of insulin  [facility]) Assessment & Plan: Traditionally great control, latest A1c 5.6% (10/2023)  Continue to hold ozempic  pending labwork and possibly GI eval.    History of adenomatous polyp of colon Assessment & Plan: She is also due for colonoscopy.   Orders: -     Ambulatory referral to Gastroenterology; Future    I discussed the assessment and treatment plan with the patient. The patient was provided an opportunity to ask questions and all were answered. The patient agreed with the plan and demonstrated an understanding of the instructions. The patient was advised to call back or seek an in-person evaluation if the symptoms worsen or if the condition fails to improve as anticipated.  Follow up plan: No follow-ups on  file.  Anton Blas, MD

## 2024-02-16 NOTE — Assessment & Plan Note (Addendum)
 Traditionally great control, latest A1c 5.6% (10/2023)  Continue to hold ozempic  pending labwork and possibly GI eval.

## 2024-02-18 ENCOUNTER — Other Ambulatory Visit (INDEPENDENT_AMBULATORY_CARE_PROVIDER_SITE_OTHER)

## 2024-02-18 DIAGNOSIS — K219 Gastro-esophageal reflux disease without esophagitis: Secondary | ICD-10-CM

## 2024-02-18 DIAGNOSIS — S83412D Sprain of medial collateral ligament of left knee, subsequent encounter: Secondary | ICD-10-CM | POA: Diagnosis not present

## 2024-02-18 DIAGNOSIS — R1013 Epigastric pain: Secondary | ICD-10-CM

## 2024-02-18 DIAGNOSIS — M25562 Pain in left knee: Secondary | ICD-10-CM | POA: Diagnosis not present

## 2024-02-18 NOTE — Addendum Note (Signed)
 Addended by: HOPE VEVA PARAS on: 02/18/2024 04:07 PM   Modules accepted: Orders

## 2024-02-19 LAB — COMPREHENSIVE METABOLIC PANEL WITH GFR
AG Ratio: 1.4 (calc) (ref 1.0–2.5)
ALT: 117 U/L — ABNORMAL HIGH (ref 6–29)
AST: 67 U/L — ABNORMAL HIGH (ref 10–35)
Albumin: 4.3 g/dL (ref 3.6–5.1)
Alkaline phosphatase (APISO): 205 U/L — ABNORMAL HIGH (ref 31–125)
BUN/Creatinine Ratio: 9 (calc) (ref 6–22)
BUN: 10 mg/dL (ref 7–25)
CO2: 26 mmol/L (ref 20–32)
Calcium: 9.7 mg/dL (ref 8.6–10.2)
Chloride: 103 mmol/L (ref 98–110)
Creat: 1.12 mg/dL — ABNORMAL HIGH (ref 0.50–0.99)
Globulin: 3 g/dL (ref 1.9–3.7)
Glucose, Bld: 98 mg/dL (ref 65–99)
Potassium: 4.5 mmol/L (ref 3.5–5.3)
Sodium: 140 mmol/L (ref 135–146)
Total Bilirubin: 0.5 mg/dL (ref 0.2–1.2)
Total Protein: 7.3 g/dL (ref 6.1–8.1)
eGFR: 61 mL/min/1.73m2 (ref >=60)

## 2024-02-19 LAB — CBC WITH DIFFERENTIAL/PLATELET
Absolute Lymphocytes: 2723 {cells}/uL (ref 850–3900)
Absolute Monocytes: 518 {cells}/uL (ref 200–950)
Basophils Absolute: 37 {cells}/uL (ref 0–200)
Basophils Relative: 0.5 %
Eosinophils Absolute: 241 {cells}/uL (ref 15–500)
Eosinophils Relative: 3.3 %
HCT: 41.7 % (ref 35.0–45.0)
Hemoglobin: 14.2 g/dL (ref 11.7–15.5)
MCH: 33.6 pg — ABNORMAL HIGH (ref 27.0–33.0)
MCHC: 34.1 g/dL (ref 32.0–36.0)
MCV: 98.8 fL (ref 80.0–100.0)
MPV: 10.8 fL (ref 7.5–12.5)
Monocytes Relative: 7.1 %
Neutro Abs: 3781 {cells}/uL (ref 1500–7800)
Neutrophils Relative %: 51.8 %
Platelets: 306 Thousand/uL (ref 140–400)
RBC: 4.22 Million/uL (ref 3.80–5.10)
RDW: 11.5 % (ref 11.0–15.0)
Total Lymphocyte: 37.3 %
WBC: 7.3 Thousand/uL (ref 3.8–10.8)

## 2024-02-19 LAB — LIPASE: Lipase: 21 U/L (ref 7–60)

## 2024-02-20 ENCOUNTER — Encounter (INDEPENDENT_AMBULATORY_CARE_PROVIDER_SITE_OTHER): Payer: Self-pay | Admitting: Nurse Practitioner

## 2024-02-20 NOTE — Progress Notes (Signed)
 Subjective:    Patient ID: Cassandra [name], female    DOB: 1975-10-30, 47 y.o.   MRN: [id number] Chief Complaint  Patient presents with   New Patient (Initial Visit)    onsult. 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 Cassandra [name].      Patient presents today for evaluation of varicose veins.  The patient recently had significant bleeding from a varicosity that required stitches.  This varicosity still remains somewhat prominent.  She does have some superficial varicosities that are painful and tender with palpation.  She has engaged in conservative therapy however she recently has suffered an accident and tore one of her ligaments in her knee.  She is currently undergoing physical therapy but no require surgery.  Today noninvasive study showed no evidence of DVT or superficial thrombophlebitis bilaterally.  No evidence of deep venous insufficiency noted bilaterally.    Review of Systems  Musculoskeletal:  Positive for arthralgias.  Hematological:  Bruises/bleeds easily.  All other systems reviewed and are negative.      Objective:   Physical Exam Vitals reviewed.  HENT:     Head: Normocephalic.  Cardiovascular:     Rate and Rhythm: Normal rate.  Pulmonary:     Effort: Pulmonary effort is normal.  Skin:    General: Skin is warm and dry.  Neurological:     Mental Status: She is alert and oriented to person, place, and time.     Gait: Gait abnormal.  Psychiatric:        Mood and Affect: Mood normal.        Behavior: Behavior normal.        Thought Content: Thought content normal.        Judgment: Judgment normal.     BP 118/80   Pulse 86   Ht 5' 8 (1.727 m)   Wt 280 lb (127 kg)   LMP 07/10/2020   BMI 42.57 kg/m   Past Medical History:  Diagnosis Date   Allergy    Allergy to animal dander    cats and dogs   Anxiety    Arthritis    Back   Complication of anesthesia    Pt does not want an epidural, have had reactions to that medication    Constipation    alternating from constipation to diarrhea   COVID-19 07/12/2020   Depression 2017   Diabetes mellitus without complication (HCC) 02/27/2014   Type II   Gallstones 06/2016   by xray - surgery to remove   Generalized headaches    frequent   GERD (gastroesophageal reflux disease) 2020   History of abnormal Pap smear    remote   History of gestational diabetes    first 2 pregnancies   History of kidney stones    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HCC) 12/31/2011   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    Hypertension    IBS (irritable bowel syndrome)    Morbid obesity (HCC)    Periodontal disease 08/2011   currently getting dental work   Tobacco use     Social History   Socioeconomic History   Marital status: Married    Spouse name: Not on file   Number of children: 2   Years of education: Not on file   Highest education level: Associate degree: occupational, Scientist, product/process development, or vocational program  Occupational History   Occupation: CSR  Tobacco Use   Smoking status: Some Days    Current packs/day: 0.10  Average packs/day: 0.1 packs/day for 23.0 years (2.3 ttl pk-yrs)    Types: Cigarettes   Smokeless tobacco: Never   Tobacco comments:    1 every now and then  Vaping Use   Vaping status: Never Used  Substance and Sexual Activity   Alcohol use: Yes    Alcohol/week: 0.0 standard drinks of alcohol    Comment: occasional   Drug use: No   Sexual activity: Yes    Partners: Male    Birth control/protection: Surgical  Other Topics Concern   Not on file  Social History Narrative   Lives with husband and 2 children, no pets   Occupation: call center rep   Edu: some college   Activity: no regular exercise.  Tries to walk around building.   Diet: 1 mt dew in am, rest water, fruits/vegetables daily    Social Drivers of Health   Financial Resource Strain: Medium Risk (02/13/2024)   Overall Financial Resource Strain (CARDIA)    Difficulty of Paying Living Expenses:  Somewhat hard  Food Insecurity: Food Insecurity Present (02/13/2024)   Hunger Vital Sign    Worried About Running Out of Food in the Last Year: Often true    Ran Out of Food in the Last Year: Sometimes true  Transportation Needs: No Transportation Needs (02/13/2024)   PRAPARE - Administrator, Civil Service (Medical): No    Lack of Transportation (Non-Medical): No  Physical Activity: Insufficiently Active (02/13/2024)   Exercise Vital Sign    Days of Exercise per Week: 1 day    Minutes of Exercise per Session: 10 min  Stress: Stress Concern Present (02/13/2024)   [employer] of Occupational Health - Occupational Stress Questionnaire    Feeling of Stress: To some extent  Social Connections: Moderately Integrated (02/13/2024)   Social Connection and Isolation Panel    Frequency of Communication with Friends and Family: Once a week    Frequency of Social Gatherings with Friends and Family: Once a week    Attends Religious Services: 1 to 4 times per year    Active Member of [employer] or Organizations: Yes    Attends Banker Meetings: More than 4 times per year    Marital Status: Married  Catering manager Violence: Not on file    Past Surgical History:  Procedure Laterality Date   ABDOMINAL HYSTERECTOMY  March 2022   BIOPSY  07/04/2018   Procedure: BIOPSY;  Surgeon: Eda Iha, MD;  Location: [facility];  Service: Gastroenterology;;   CESAREAN SECTION  [phone]   x2   CHOLECYSTECTOMY  2018   COLONOSCOPY WITH PROPOFOL  N/A 07/04/2018   for chronic diarrhea after cholecystectomy started on colestipol  with benefit ([facility]). Thought she has IBS-D. 2 TA, rpt 5 yrs   CYSTOSCOPY N/A 08/20/2020   Procedure: CYSTOSCOPY;  Surgeon: Izell Harari, MD;  Location: [facility];  Service: Gynecology;  Laterality: N/A;   ENDOMETRIAL BIOPSY  01/25/2020       HYSTERECTOMY ABDOMINAL WITH SALPINGECTOMY Bilateral 08/20/2020   done for heavy bleed and pelvic pain - pathology  showed CIN1 of cervix Eleanora, Harari, MD)   HYSTEROSCOPY WITH D & C N/A 01/05/2018   [facility] for heavy bleeding, IUD removed - DILATATION AND CURETTAGE /HYSTEROSCOPY WITH MINERVA ABLATION; Fredirick Glenys RAMAN, MD   INTRAUTERINE DEVICE INSERTION     Mirena   POLYPECTOMY  07/04/2018   Procedure: POLYPECTOMY;  Surgeon: Eda Iha, MD;  Location: [facility];  Service: Gastroenterology;;   TOE SURGERY Left 2022  TOTAL LAPAROSCOPIC HYSTERECTOMY WITH SALPINGECTOMY Bilateral 08/20/2020   Procedure: ATTEMPTED TOTAL LAPAROSCOPIC HYSTERECTOMY WITH SALPINGECTOMY;  Surgeon: Izell Harari, MD;  Location: [facility] OR;  Service: Gynecology;  Laterality: Bilateral;   TUBAL LIGATION  2009   WISDOM TOOTH EXTRACTION     x 4    Family History  Problem Relation Age of Onset   Hypertension Mother    Depression Mother    Hypertension Father    Appendicitis Father    Atrial fibrillation Father    Hearing loss Father    Diabetes Maternal Grandmother        s/p amputation   Hyperlipidemia Maternal Grandmother    Hypertension Maternal Grandmother    Stroke Maternal Grandmother    Hyperlipidemia Maternal Grandfather    Hypertension Maternal Grandfather    Skin cancer Maternal Grandfather    Diabetes Maternal Grandfather    Cancer Maternal Grandfather    COPD Maternal Grandfather    Seizures Paternal Grandmother 75       deceased   Early death Paternal Grandmother    Coronary artery disease Paternal Grandfather 59       MI   Early death Paternal Grandfather    ADD / ADHD Brother    ADD / ADHD Son    Breast cancer Neg Hx    Colon cancer Neg Hx    Colon polyps Neg Hx    Esophageal cancer Neg Hx    Rectal cancer Neg Hx    Stomach cancer Neg Hx     Allergies  Allergen Reactions   Metformin  And Related Diarrhea   Nickel Rash       Latest Ref Rng & Units 02/18/2024    4:08 PM 11/02/2023    8:35 AM 09/03/2023    1:47 PM  CBC  WBC 3.8 - 10.8 Thousand/uL 7.3  10.0  7.1   Hemoglobin 11.7 -  15.5 g/dL 85.7  85.7  85.8   Hematocrit 35.0 - 45.0 % 41.7  41.9  41.4   Platelets 140 - 400 Thousand/uL 306  296.0  313       CMP     Component Value Date/Time   NA 140 02/18/2024 1608   K 4.5 02/18/2024 1608   CL 103 02/18/2024 1608   CO2 26 02/18/2024 1608   GLUCOSE 98 02/18/2024 1608   BUN 10 02/18/2024 1608   CREATININE 1.12 (H) 02/18/2024 1608   CALCIUM  9.7 02/18/2024 1608   PROT 7.3 02/18/2024 1608   ALBUMIN  4.1 11/02/2023 0835   AST 67 (H) 02/18/2024 1608   ALT 117 (H) 02/18/2024 1608   ALKPHOS 67 11/02/2023 0835   BILITOT 0.5 02/18/2024 1608   GFR 65.30 11/02/2023 0835   EGFR 61 02/18/2024 1608   GFRNONAA >60 09/02/2023 1138     No results found.     Assessment & Plan:   1. 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  Patient should undergo injection sclerotherapy to treat the residual varicosities.  The risks, benefits and alternative therapies were reviewed in detail with the patient.  All questions were answered.  The patient agrees to proceed with sclerotherapy at their convenience.   2. Primary hypertension Continue antihypertensive medications as already ordered, these medications have been reviewed and there are no changes at this time.  3. Other tear  of medial meniscus, current injury, left knee, subsequent encounter The patient may potentially be undergoing knee surgery.  We would want the patient to undergo her knee surgery prior to doing any schlerotherapy.  The patient will contact us  to let us  know once she knows whether or not she will require surgery.   Current Outpatient Medications on File Prior to Visit  Medication Sig Dispense Refill   Accu-Chek FastClix Lancets MISC 1 each by Does not apply route as directed.  Use as directed to check blood sugar twice daily as needed. E11.8 102 each 3   amLODipine  (NORVASC ) 5 MG tablet Take 1 tablet (5 mg total) by mouth daily. 90 tablet 4   BIKTARVY  50-200-25 MG TABS tablet Take 1 tablet by mouth at bedtime. 90 tablet 4   cetirizine (ZYRTEC) 10 MG tablet Take 10 mg by mouth at bedtime.     colestipol  (COLESTID ) 1 g tablet TAKE 2 TABLETS (2 G TOTAL) BY MOUTH EVERY MONDAY, WEDNESDAY, AND FRIDAY. 270 tablet 0   cyanocobalamin  (VITAMIN B12) 1000 MCG tablet Take 1 tablet (1,000 mcg total) by mouth daily.     cyclobenzaprine  (FLEXERIL ) 5 MG tablet Take 1 tablet (5 mg total) by mouth 2 (two) times daily as needed (headache (sedation precautions)). 30 tablet 1   EPINEPHrine 0.3 mg/0.3 mL IJ SOAJ injection Inject into the muscle once as needed.     escitalopram  (LEXAPRO ) 20 MG tablet Take 1 tablet (20 mg total) by mouth daily. 90 tablet 4   famotidine  (PEPCID ) 20 MG tablet Take 1 tablet (20 mg total) by mouth 2 (two) times daily. 60 tablet 0   fluconazole  (DIFLUCAN ) 150 MG tablet Take 1 tablet (150 mg total) by mouth every 3 (three) days. 2 tablet 0   glucose blood (ACCU-CHEK GUIDE) test strip 1 each by Other route as needed for other. Use as instructed to check blood sugar 100 each 0   lisinopril  (ZESTRIL ) 10 MG tablet Take 1 tablet (10 mg total) by mouth daily. 90 tablet 4   mometasone  (ELOCON ) 0.1 % ointment Apply liberally to vulva at bedtime x 2 weeks and then 2-3x/week until seen in the office 45 g 1   omeprazole  (PRILOSEC) 40 MG capsule TAKE 1 CAPSULE (40 MG TOTAL) BY MOUTH DAILY. FOR 3 WEEKS THEN AS NEEDED 90 capsule 0   Semaglutide , 1 MG/DOSE, (OZEMPIC , 1 MG/DOSE,) 4 MG/3ML SOPN INJECT 1 MG ONCE A WEEK AS DIRECTED (Patient not taking: Reported on 02/16/2024) 9 mL 0   No current facility-administered medications on file prior to visit.    There are no Patient Instructions on file for this visit. No follow-ups on file.   Loyalty Brashier E Jwan Hornbaker, NP

## 2024-02-21 ENCOUNTER — Ambulatory Visit: Payer: Self-pay | Admitting: Family Medicine

## 2024-02-21 ENCOUNTER — Other Ambulatory Visit

## 2024-02-21 DIAGNOSIS — R1013 Epigastric pain: Secondary | ICD-10-CM

## 2024-02-21 DIAGNOSIS — R1011 Right upper quadrant pain: Secondary | ICD-10-CM

## 2024-02-21 DIAGNOSIS — R7401 Elevation of levels of liver transaminase levels: Secondary | ICD-10-CM

## 2024-02-22 ENCOUNTER — Ambulatory Visit
Admission: RE | Admit: 2024-02-22 | Discharge: 2024-02-22 | Disposition: A | Discharge status: Home / Self Care | Source: Ambulatory Visit | Attending: Family Medicine | Admitting: Family Medicine

## 2024-02-22 ENCOUNTER — Ambulatory Visit: Admitting: Family Medicine

## 2024-02-22 DIAGNOSIS — S83242D Other tear of medial meniscus, current injury, left knee, subsequent encounter: Secondary | ICD-10-CM | POA: Diagnosis not present

## 2024-02-22 DIAGNOSIS — R1013 Epigastric pain: Secondary | ICD-10-CM | POA: Diagnosis not present

## 2024-02-22 DIAGNOSIS — S83512D Sprain of anterior cruciate ligament of left knee, subsequent encounter: Secondary | ICD-10-CM | POA: Diagnosis not present

## 2024-02-22 DIAGNOSIS — S83412D Sprain of medial collateral ligament of left knee, subsequent encounter: Secondary | ICD-10-CM | POA: Diagnosis not present

## 2024-02-22 DIAGNOSIS — R7401 Elevation of levels of liver transaminase levels: Secondary | ICD-10-CM | POA: Insufficient documentation

## 2024-02-22 DIAGNOSIS — R1011 Right upper quadrant pain: Secondary | ICD-10-CM | POA: Insufficient documentation

## 2024-02-22 DIAGNOSIS — Z9049 Acquired absence of other specified parts of digestive tract: Secondary | ICD-10-CM | POA: Diagnosis not present

## 2024-02-22 DIAGNOSIS — K8050 Calculus of bile duct without cholangitis or cholecystitis without obstruction: Secondary | ICD-10-CM | POA: Diagnosis not present

## 2024-02-28 ENCOUNTER — Ambulatory Visit: Payer: Self-pay | Admitting: Family Medicine

## 2024-03-01 ENCOUNTER — Other Ambulatory Visit

## 2024-03-04 ENCOUNTER — Other Ambulatory Visit: Payer: Self-pay | Admitting: Family Medicine

## 2024-03-04 DIAGNOSIS — E1169 Type 2 diabetes mellitus with other specified complication: Secondary | ICD-10-CM

## 2024-03-06 NOTE — Telephone Encounter (Signed)
 Ozempic  Last filled:  12/14/23, #9 mL Last OV:  01/03/24, L knee injury Next OV:  none

## 2024-03-07 DIAGNOSIS — S83242D Other tear of medial meniscus, current injury, left knee, subsequent encounter: Secondary | ICD-10-CM | POA: Diagnosis not present

## 2024-03-08 DIAGNOSIS — M23632 Other spontaneous disruption of medial collateral ligament of left knee: Secondary | ICD-10-CM | POA: Diagnosis not present

## 2024-03-08 DIAGNOSIS — G8918 Other acute postprocedural pain: Secondary | ICD-10-CM | POA: Diagnosis not present

## 2024-03-08 DIAGNOSIS — S83512A Sprain of anterior cruciate ligament of left knee, initial encounter: Secondary | ICD-10-CM | POA: Diagnosis not present

## 2024-03-08 DIAGNOSIS — S83242A Other tear of medial meniscus, current injury, left knee, initial encounter: Secondary | ICD-10-CM | POA: Diagnosis not present

## 2024-03-08 HISTORY — PX: KNEE SURGERY: SHX244

## 2024-03-09 NOTE — Telephone Encounter (Signed)
 Ozempic  is currently on hold due to GI symptoms.  Plz call for update on GI symptoms - if better, would suggest restarting ozempic  at lower 0.5mg  dose to see if better tolerated.

## 2024-03-09 NOTE — Telephone Encounter (Signed)
 Noted. ERx lower ozepmic 0.5mg  weekly dose

## 2024-03-09 NOTE — Telephone Encounter (Signed)
 Spoke with Cassandra [name] asking about GI sxs due to previously taking Ozempic . Cassandra [name] states sxs have resolved, she wants to restart Ozempic  and agrees to 0.5 mg dose.   Also, Cassandra [name] wants to make Dr KANDICE know she had L knee surgery.

## 2024-03-13 DIAGNOSIS — S83242D Other tear of medial meniscus, current injury, left knee, subsequent encounter: Secondary | ICD-10-CM | POA: Diagnosis not present

## 2024-03-21 DIAGNOSIS — S83242D Other tear of medial meniscus, current injury, left knee, subsequent encounter: Secondary | ICD-10-CM | POA: Diagnosis not present

## 2024-03-22 DIAGNOSIS — Z4889 Encounter for other specified surgical aftercare: Secondary | ICD-10-CM | POA: Diagnosis not present

## 2024-03-29 DIAGNOSIS — S83242D Other tear of medial meniscus, current injury, left knee, subsequent encounter: Secondary | ICD-10-CM | POA: Diagnosis not present

## 2024-04-11 ENCOUNTER — Encounter: Payer: Self-pay | Admitting: Obstetrics and Gynecology

## 2024-04-14 ENCOUNTER — Ambulatory Visit: Admitting: Obstetrics and Gynecology

## 2024-04-14 DIAGNOSIS — S83242D Other tear of medial meniscus, current injury, left knee, subsequent encounter: Secondary | ICD-10-CM | POA: Diagnosis not present

## 2024-04-14 NOTE — Progress Notes (Signed)
 NATARA MONFORT                                          MRN: [id number]   04/14/2024   The [facility] Quality Team Specialist reviewed this patient medical record for the purposes of chart review for care gap closure. The following were reviewed: chart review for care gap closure-kidney health evaluation for diabetes:eGFR  and uACR.    [facility] Quality Team

## 2024-04-20 ENCOUNTER — Encounter: Payer: Self-pay | Admitting: Pharmacist

## 2024-04-20 NOTE — Progress Notes (Signed)
 Pharmacy Quality Measure Review  This patient is appearing on a report for being at risk of failing the Kidney Health Evaluation for Patients with Diabetes measure this calendar year.   Last documented UACR: Never   Pharmacy Quality Measure Review  This patient is appearing on the insurance-providing list for being at risk of failing the adherence measure for Statin Use in Persons with Diabetes (SUPD) medications this calendar year.   No hx statin trial. LDL sig elevated.  Lab Results  Component Value Date   LDLCALC 120 (H) 04/26/2023    Last routine PCP visit 11/02/23 with plan for 62-month chronic disease f/u.  No f/u scheduled.  Has since seen provider for acute visit and hospital f/u though no chronic disease/DM follow up Fwd to PCP/scheduling pool for consideration of future f/u

## 2024-04-21 DIAGNOSIS — S83242D Other tear of medial meniscus, current injury, left knee, subsequent encounter: Secondary | ICD-10-CM | POA: Diagnosis not present

## 2024-04-22 ENCOUNTER — Other Ambulatory Visit: Payer: Self-pay | Admitting: Family Medicine

## 2024-04-22 DIAGNOSIS — K219 Gastro-esophageal reflux disease without esophagitis: Secondary | ICD-10-CM

## 2024-04-24 NOTE — Telephone Encounter (Signed)
 Below is from note with [facility] on 8/29. Don't see where patient followed up ok to refill?   Instructed to continue omeprazole  40 mg daily  Avoid triggers including carbonation (handout given), avoid nsaids (she is)  Add famotidine  20 mg bid for extra acid control Elevate head of bed if able  Hold GLP dose 1 week Monitor closely Follow up with pcp 1 week or sooner  Ultimately may need GI referral

## 2024-04-26 DIAGNOSIS — S83242D Other tear of medial meniscus, current injury, left knee, subsequent encounter: Secondary | ICD-10-CM | POA: Diagnosis not present

## 2024-05-03 ENCOUNTER — Ambulatory Visit: Admitting: Family Medicine

## 2024-05-10 DIAGNOSIS — S83242D Other tear of medial meniscus, current injury, left knee, subsequent encounter: Secondary | ICD-10-CM | POA: Diagnosis not present

## 2024-05-11 ENCOUNTER — Encounter: Payer: Self-pay | Admitting: *Deleted

## 2024-05-12 ENCOUNTER — Encounter: Payer: Self-pay | Admitting: Family Medicine

## 2024-05-12 ENCOUNTER — Other Ambulatory Visit (HOSPITAL_COMMUNITY)
Admission: RE | Admit: 2024-05-12 | Discharge: 2024-05-12 | Disposition: A | Source: Ambulatory Visit | Attending: Obstetrics and Gynecology | Admitting: Obstetrics and Gynecology

## 2024-05-12 ENCOUNTER — Ambulatory Visit: Admitting: Family Medicine

## 2024-05-12 ENCOUNTER — Ambulatory Visit (INDEPENDENT_AMBULATORY_CARE_PROVIDER_SITE_OTHER): Admitting: Obstetrics and Gynecology

## 2024-05-12 VITALS — BP 121/86 | HR 91 | Wt 298.4 lb

## 2024-05-12 VITALS — BP 120/68 | HR 92 | Temp 97.5°F | Ht 68.0 in | Wt 294.0 lb

## 2024-05-12 DIAGNOSIS — R7401 Elevation of levels of liver transaminase levels: Secondary | ICD-10-CM | POA: Diagnosis not present

## 2024-05-12 DIAGNOSIS — G43109 Migraine with aura, not intractable, without status migrainosus: Secondary | ICD-10-CM

## 2024-05-12 DIAGNOSIS — I10 Essential (primary) hypertension: Secondary | ICD-10-CM

## 2024-05-12 DIAGNOSIS — E1169 Type 2 diabetes mellitus with other specified complication: Secondary | ICD-10-CM

## 2024-05-12 DIAGNOSIS — Z0001 Encounter for general adult medical examination with abnormal findings: Secondary | ICD-10-CM | POA: Diagnosis not present

## 2024-05-12 DIAGNOSIS — F331 Major depressive disorder, recurrent, moderate: Secondary | ICD-10-CM

## 2024-05-12 DIAGNOSIS — N898 Other specified noninflammatory disorders of vagina: Secondary | ICD-10-CM | POA: Diagnosis not present

## 2024-05-12 DIAGNOSIS — Z87891 Personal history of nicotine dependence: Secondary | ICD-10-CM

## 2024-05-12 DIAGNOSIS — Z21 Asymptomatic human immunodeficiency virus [HIV] infection status: Secondary | ICD-10-CM

## 2024-05-12 DIAGNOSIS — Z8742 Personal history of other diseases of the female genital tract: Secondary | ICD-10-CM

## 2024-05-12 DIAGNOSIS — E538 Deficiency of other specified B group vitamins: Secondary | ICD-10-CM | POA: Diagnosis not present

## 2024-05-12 DIAGNOSIS — R400 Somnolence: Secondary | ICD-10-CM | POA: Insufficient documentation

## 2024-05-12 DIAGNOSIS — R1013 Epigastric pain: Secondary | ICD-10-CM | POA: Diagnosis not present

## 2024-05-12 DIAGNOSIS — Z6841 Body Mass Index (BMI) 40.0 and over, adult: Secondary | ICD-10-CM

## 2024-05-12 DIAGNOSIS — Z23 Encounter for immunization: Secondary | ICD-10-CM

## 2024-05-12 DIAGNOSIS — K529 Noninfective gastroenteritis and colitis, unspecified: Secondary | ICD-10-CM

## 2024-05-12 DIAGNOSIS — G4733 Obstructive sleep apnea (adult) (pediatric): Secondary | ICD-10-CM

## 2024-05-12 DIAGNOSIS — S8992XD Unspecified injury of left lower leg, subsequent encounter: Secondary | ICD-10-CM

## 2024-05-12 DIAGNOSIS — K219 Gastro-esophageal reflux disease without esophagitis: Secondary | ICD-10-CM

## 2024-05-12 DIAGNOSIS — I872 Venous insufficiency (chronic) (peripheral): Secondary | ICD-10-CM

## 2024-05-12 DIAGNOSIS — Z860101 Personal history of adenomatous and serrated colon polyps: Secondary | ICD-10-CM

## 2024-05-12 HISTORY — DX: Obstructive sleep apnea (adult) (pediatric): G47.33

## 2024-05-12 LAB — BASIC METABOLIC PANEL WITH GFR
BUN: 17 mg/dL (ref 6–23)
CO2: 33 meq/L — ABNORMAL HIGH (ref 19–32)
Calcium: 9 mg/dL (ref 8.4–10.5)
Chloride: 102 meq/L (ref 96–112)
Creatinine, Ser: 0.99 mg/dL (ref 0.40–1.20)
GFR: 68.23 mL/min (ref >60.00)
Glucose, Bld: 82 mg/dL (ref 70–99)
Potassium: 4.2 meq/L (ref 3.5–5.1)
Sodium: 141 meq/L (ref 135–145)

## 2024-05-12 LAB — LIPID PANEL
Cholesterol: 175 mg/dL (ref 0–200)
HDL: 47.4 mg/dL (ref >39.00)
LDL Cholesterol: 106 mg/dL — ABNORMAL HIGH (ref 0–99)
NonHDL: 127.49
Total CHOL/HDL Ratio: 4
Triglycerides: 105 mg/dL (ref 0.0–149.0)
VLDL: 21 mg/dL (ref 0.0–40.0)

## 2024-05-12 LAB — HEPATIC FUNCTION PANEL
ALT: 21 U/L (ref 0–35)
AST: 19 U/L (ref 0–37)
Albumin: 4 g/dL (ref 3.5–5.2)
Alkaline Phosphatase: 88 U/L (ref 39–117)
Bilirubin, Direct: 0.1 mg/dL (ref 0.0–0.3)
Total Bilirubin: 0.6 mg/dL (ref 0.2–1.2)
Total Protein: 6.6 g/dL (ref 6.0–8.3)

## 2024-05-12 LAB — POCT GLYCOSYLATED HEMOGLOBIN (HGB A1C): Hemoglobin A1C: 5.5 % (ref 4.0–5.6)

## 2024-05-12 LAB — IBC PANEL
Iron: 78 ug/dL (ref 42–145)
Saturation Ratios: 23.6 % (ref 20.0–50.0)
TIBC: 330.4 ug/dL (ref 250.0–450.0)
Transferrin: 236 mg/dL (ref 212.0–360.0)

## 2024-05-12 LAB — MICROALBUMIN / CREATININE URINE RATIO
Creatinine,U: 95.6 mg/dL
Microalb Creat Ratio: UNDETERMINED mg/g (ref 0.0–30.0)
Microalb, Ur: <0.7 mg/dL

## 2024-05-12 LAB — LIPASE: Lipase: 26 U/L (ref 11.0–59.0)

## 2024-05-12 LAB — VITAMIN B12: Vitamin B-12: 1137 pg/mL — ABNORMAL HIGH (ref 211–911)

## 2024-05-12 LAB — TSH: TSH: 0.92 u[IU]/mL (ref 0.35–5.50)

## 2024-05-12 MED ORDER — OMEPRAZOLE 40 MG PO CPDR: 40.0000 mg | DELAYED_RELEASE_CAPSULE | Freq: Every day | ORAL | 3 refills | Status: AC

## 2024-05-12 MED ORDER — LISINOPRIL 10 MG PO TABS: 10.0000 mg | ORAL_TABLET | Freq: Every day | ORAL | 3 refills | Status: AC

## 2024-05-12 MED ORDER — COLESTIPOL HCL 1 G PO TABS: 2.0000 g | ORAL_TABLET | ORAL | 3 refills | Status: AC

## 2024-05-12 MED ORDER — ESCITALOPRAM OXALATE 20 MG PO TABS: 20.0000 mg | ORAL_TABLET | Freq: Every day | ORAL | 3 refills | Status: AC

## 2024-05-12 MED ORDER — AMLODIPINE BESYLATE 5 MG PO TABS: 5.0000 mg | ORAL_TABLET | Freq: Every day | ORAL | 3 refills | Status: AC

## 2024-05-12 MED ORDER — OZEMPIC (0.25 OR 0.5 MG/DOSE) 2 MG/3ML ~~LOC~~ SOPN: 0.5000 mg | PEN_INJECTOR | SUBCUTANEOUS | 11 refills | Status: AC

## 2024-05-12 NOTE — Assessment & Plan Note (Signed)
 Remains well controlled. Continue ozempic  0.5mg  weekly.  Dose titration limited by GI upset. Update lipase.  Await GI eval.

## 2024-05-12 NOTE — Progress Notes (Signed)
 Obstetrics and Gynecology New Patient Evaluation  Appointment Date: 05/12/2024  OBGYN Clinic: [facility]   Primary Care Provider: [name]  Referring Provider: [name], MD  Chief Complaint: f/u vaginal dryness, dyspaurenia   History of Present Illness: Cassandra [name] is a 48 y.o.  [phone] (Patient's last menstrual period was 07/10/2020.), seen for the above chief complaint. 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   Patient seen 9/3 for vaginal dryness and dyspaurenia x 1 year. Patient feels well. Still with+vaginal dryness  Pap and hpv negative and swab showed BV and FSH and estradiol  not c/w menopause. Vulvar irritation and erythema noted so flagyl  and multi course diflucan  along with mometasone  ointment, which she states she stopped two weeks ago.  Review of Systems: Pertinent items are noted in HPI.   Patient Active Problem List   Diagnosis Date Noted   Other tear of medial meniscus, current injury, left knee, subsequent encounter 01/25/2024   Bleeding from varicose veins of left lower extremity 01/03/2024   Injury of left knee 01/03/2024   Epigastric pain 11/02/2023   Environmental allergies 11/02/2023   Pulsatile tinnitus of right ear 10/02/2023   Ganglion cyst of dorsum of left wrist 11/21/2021   Dysplasia of cervix, low grade (CIN 1) 09/26/2020   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   Migraine 06/30/2019   Vitamin B12 deficiency 12/31/2018   GERD (gastroesophageal reflux disease) 12/27/2018   History of adenomatous polyp of colon    Chronic diarrhea 04/04/2018   Closed compression fracture of L1 vertebra (HCC) 07/05/2016   Lumbar pain with radiation down both legs 12/20/2015   Health maintenance examination 05/25/2014   Right-sided headache 11/27/2013   Ulnar neuropathy 06/28/2013   Hypertension 09/14/2012   HIV (human immunodeficiency  virus infection) (HCC) 01/04/2012   Chronic venous insufficiency of lower extremity 01/01/2012   MDD (major depressive disorder), recurrent episode, moderate (HCC) 09/02/2011   Body mass index (BMI) of 40.0 to 44.9 in adult [facility])    Ex-smoker    Type 2 diabetes mellitus with other specified complication (HCC)    Past Medical History:  Past Medical History:  Diagnosis Date   Allergy    Allergy to animal dander    cats and dogs   Anxiety    Arthritis    Back   Complication of anesthesia    Pt does not want an epidural, have had reactions to that medication   Constipation    alternating from constipation to diarrhea   COVID-19 07/12/2020   Depression 2017   Diabetes mellitus without complication (HCC) 02/27/2014   Type II   Gallstones 06/2016   by xray - surgery to remove   Generalized headaches    frequent   GERD (gastroesophageal reflux disease) 2020   History of abnormal Pap smear    remote   History of gestational diabetes    first 2 pregnancies   History of kidney stones    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HCC) 12/31/2011   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    Hypertension    IBS (irritable bowel syndrome)    Morbid obesity (HCC)    Periodontal disease 08/2011   currently getting dental work   Tobacco use    Past Surgical History:  Past Surgical History:  Procedure Laterality Date   ABDOMINAL HYSTERECTOMY  March 2022   BIOPSY  07/04/2018   Procedure: BIOPSY;  Surgeon:  Eda Iha, MD;  Location: [facility];  Service: Gastroenterology;;   CESAREAN SECTION  [phone]   x2   CHOLECYSTECTOMY  2018   COLONOSCOPY WITH PROPOFOL  N/A 07/04/2018   for chronic diarrhea after cholecystectomy started on colestipol  with benefit ([place]). Thought she has IBS-D. 2 TA, rpt 5 yrs   CYSTOSCOPY N/A 08/20/2020   Procedure: CYSTOSCOPY;  Surgeon: Izell Harari, MD;  Location: [facility] OR;  Service: Gynecology;  Laterality: N/A;   ENDOMETRIAL BIOPSY  01/25/2020        HYSTERECTOMY ABDOMINAL WITH SALPINGECTOMY Bilateral 08/20/2020   done for heavy bleed and pelvic pain - pathology showed CIN1 of cervix Eleanora, Harari, MD)   HYSTEROSCOPY WITH D & C N/A 01/05/2018   [place] for heavy bleeding, IUD removed - DILATATION AND CURETTAGE /HYSTEROSCOPY WITH MINERVA ABLATION; Fredirick Glenys RAMAN, MD   INTRAUTERINE DEVICE INSERTION     Mirena   KNEE SURGERY Left 03/08/2024   POLYPECTOMY  07/04/2018   Procedure: POLYPECTOMY;  Surgeon: Eda Iha, MD;  Location: [facility];  Service: Gastroenterology;;   TOE SURGERY Left 2022   TOTAL LAPAROSCOPIC HYSTERECTOMY WITH SALPINGECTOMY Bilateral 08/20/2020   Procedure: ATTEMPTED TOTAL LAPAROSCOPIC HYSTERECTOMY WITH SALPINGECTOMY;  Surgeon: Izell Harari, MD;  Location: [facility] OR;  Service: Gynecology;  Laterality: Bilateral;   TUBAL LIGATION  2009   WISDOM TOOTH EXTRACTION     x 4   Past Obstetrical History:  OB History  Gravida Para Term Preterm AB Living  2 2 2  0 0 2  SAB IAB Ectopic Multiple Live Births  0 0 0 0 2    # Outcome Date GA Lbr Len/2nd Weight Sex Type Anes PTL Lv  2 Term 2009    M CS-Unspec   LIV  1 Term 1999    M CS-Unspec   LIV   Past Gynecological History: As per HPI.  Social History:  Social History   Socioeconomic History   Marital status: Married    Spouse name: Not on file   Number of children: 2   Years of education: Not on file   Highest education level: Associate degree: occupational, scientist, product/process development, or vocational program  Occupational History   Occupation: CSR  Tobacco Use   Smoking status: Some Days    Current packs/day: 0.10    Average packs/day: 0.1 packs/day for 23.0 years (2.3 ttl pk-yrs)    Types: Cigarettes   Smokeless tobacco: Never   Tobacco comments:    1 every now and then  Vaping Use   Vaping status: Never Used  Substance and Sexual Activity   Alcohol use: Yes    Alcohol/week: 0.0 standard drinks of alcohol    Comment: occasional   Drug use: No   Sexual  activity: Yes    Partners: Male    Birth control/protection: Surgical  Other Topics Concern   Not on file  Social History Narrative   Lives with husband and 2 children, no pets   Occupation: call center rep   Edu: some college   Activity: no regular exercise.  Tries to walk around building.   Diet: 1 mt dew in am, rest water, fruits/vegetables daily    Social Drivers of Health   Financial Resource Strain: Medium Risk (05/12/2024)   Overall Financial Resource Strain (CARDIA)    Difficulty of Paying Living Expenses: Somewhat hard  Food Insecurity: Food Insecurity Present (05/12/2024)   Hunger Vital Sign    Worried About Running Out of Food in the Last Year: Sometimes true    Ran  Out of Food in the Last Year: Sometimes true  Transportation Needs: Unmet Transportation Needs (05/12/2024)   PRAPARE - Administrator, Civil Service (Medical): Yes    Lack of Transportation (Non-Medical): No  Physical Activity: Insufficiently Active (05/12/2024)   Exercise Vital Sign    Days of Exercise per Week: 2 days    Minutes of Exercise per Session: 30 min  Stress: No Stress Concern Present (05/12/2024)   [employer] of Occupational Health - Occupational Stress Questionnaire    Feeling of Stress: Only a little  Recent Concern: Stress - Stress Concern Present (02/13/2024)   [employer] of Occupational Health - Occupational Stress Questionnaire    Feeling of Stress: To some extent  Social Connections: Moderately Integrated (05/12/2024)   Social Connection and Isolation Panel    Frequency of Communication with Friends and Family: Once a week    Frequency of Social Gatherings with Friends and Family: Once a week    Attends Religious Services: 1 to 4 times per year    Active Member of [employer] or Organizations: Yes    Attends Engineer, Structural: More than 4 times per year    Marital Status: Married  Catering Manager Violence: Not on file   Family History:  Family History   Problem Relation Age of Onset   Hypertension Mother    Depression Mother    Hypertension Father    Appendicitis Father    Atrial fibrillation Father    Hearing loss Father    Diabetes Maternal Grandmother        s/p amputation   Hyperlipidemia Maternal Grandmother    Hypertension Maternal Grandmother    Stroke Maternal Grandmother    Hyperlipidemia Maternal Grandfather    Hypertension Maternal Grandfather    Skin cancer Maternal Grandfather    Diabetes Maternal Grandfather    Cancer Maternal Grandfather    COPD Maternal Grandfather    Seizures Paternal Grandmother 69       deceased   Early death Paternal Grandmother    Coronary artery disease Paternal Grandfather 75       MI   Early death Paternal Grandfather    ADD / ADHD Brother    ADD / ADHD Son    Breast cancer Neg Hx    Colon cancer Neg Hx    Colon polyps Neg Hx    Esophageal cancer Neg Hx    Rectal cancer Neg Hx    Stomach cancer Neg Hx    Medications Cassie M. Trevor had no medications administered during this visit. Current Outpatient Medications  Medication Sig Dispense Refill   Accu-Chek FastClix Lancets MISC 1 each by Does not apply route as directed. Use as directed to check blood sugar twice daily as needed. E11.8 102 each 3   amLODipine  (NORVASC ) 5 MG tablet Take 1 tablet (5 mg total) by mouth daily. 90 tablet 4   BIKTARVY  50-200-25 MG TABS tablet Take 1 tablet by mouth at bedtime. 90 tablet 4   cetirizine (ZYRTEC) 10 MG tablet Take 10 mg by mouth at bedtime.     colestipol  (COLESTID ) 1 g tablet TAKE 2 TABLETS (2 G TOTAL) BY MOUTH EVERY MONDAY, WEDNESDAY, AND FRIDAY. 270 tablet 0   cyanocobalamin  (VITAMIN B12) 1000 MCG tablet Take 1 tablet (1,000 mcg total) by mouth daily.     cyclobenzaprine  (FLEXERIL ) 5 MG tablet Take 1 tablet (5 mg total) by mouth 2 (two) times daily as needed (headache (sedation precautions)). 30 tablet 1  EPINEPHrine 0.3 mg/0.3 mL IJ SOAJ injection Inject into the muscle once as  needed.     escitalopram  (LEXAPRO ) 20 MG tablet Take 1 tablet (20 mg total) by mouth daily. 90 tablet 4   famotidine  (PEPCID ) 20 MG tablet Take 1 tablet (20 mg total) by mouth 2 (two) times daily. 60 tablet 0   glucose blood (ACCU-CHEK GUIDE) test strip 1 each by Other route as needed for other. Use as instructed to check blood sugar 100 each 0   lisinopril  (ZESTRIL ) 10 MG tablet Take 1 tablet (10 mg total) by mouth daily. 90 tablet 4   mometasone  (ELOCON ) 0.1 % ointment Apply liberally to vulva at bedtime x 2 weeks and then 2-3x/week until seen in the office 45 g 1   omeprazole  (PRILOSEC) 40 MG capsule TAKE 1 CAPSULE (40 MG TOTAL) BY MOUTH DAILY. FOR 3 WEEKS THEN AS NEEDED 90 capsule 0   Semaglutide ,0.25 or 0.5MG /DOS, (OZEMPIC , 0.25 OR 0.5 MG/DOSE,) 2 MG/3ML SOPN Inject 0.5 mg into the skin once a week. 3 mL 6   fluconazole  (DIFLUCAN ) 150 MG tablet Take 1 tablet (150 mg total) by mouth every 3 (three) days. 2 tablet 0   metroNIDAZOLE  (FLAGYL ) 500 MG tablet Take 1 tablet (500 mg total) by mouth 2 (two) times daily. 14 tablet 0   No current facility-administered medications for this visit.   Allergies Metformin  and related and Nickel  Physical Exam:  BP 121/86   Pulse 91   Wt 298 lb 6.4 oz (135.4 kg)   LMP 07/10/2020   BMI 45.37 kg/m  Body mass index is 45.37 kg/m. General appearance: Well nourished, well developed female in no acute distress.  Respiratory:   Normal respiratory effort Neuro/Psych:  Normal mood and affect.  Skin:  Warm and dry.  Lymphatic:  No inguinal lymphadenopathy.   Pelvic exam chaperoned by CMA Pelvic exam: is limited by body habitus EGBUS: wnl Vagina: wnl, white/clear d/c in vault Cervix: surgically absent Cuff: wnl Adnexa:  normal adnexa and no mass, fullness, tenderness Rectovaginal: deferred  Laboratory: none  Radiology: none  Assessment: patient stable  Plan: 1. History of hysterectomy (Primary)  2. Vaginal dryness Follow up bv and yeast  swab. I told her if positive and after re-treatment, consideration for 2x/week vaginal estrogen is reasonable. I also d/w her re: OTC vaginal moisturizers.   3. Dyspareunia in female  4. Perimenopausal atrophic vaginitis   5. Dysplasia of cervix, low grade (CIN 1) No need for further paps  RTC based on swab results   Future Appointments  Date Time Provider Department Center  05/12/2024 12:30 PM [name], MD [facility] [id number]  08/31/2024 11:30 AM Fayette Bodily, MD [facility] None    Bebe Izell Raddle MD Attending [facility])

## 2024-05-12 NOTE — Assessment & Plan Note (Signed)
 Chronic, stable period on lexapro  - continue.

## 2024-05-12 NOTE — Assessment & Plan Note (Signed)
 On biktarvy , appreciate [facility] ID care.

## 2024-05-12 NOTE — Assessment & Plan Note (Signed)
 Saw [facility] clinic neurology, now on aimovig with improvement.

## 2024-05-12 NOTE — Assessment & Plan Note (Signed)
Continue compression stocking use. 

## 2024-05-12 NOTE — Assessment & Plan Note (Signed)
 Continue b12 daily replacement, update levels.

## 2024-05-12 NOTE — Assessment & Plan Note (Addendum)
 Update labwork today.  Recent RUQ US  showing hepatic steatosis, s/p cholcystectomy.   Fibrosis 4 Score = .93 (Low risk)      Interpretation for patients with NAFLD          <1.30       -  F0-F1 (Low risk)          1.30-2.67 -  Indeterminate           >2.67      -  F3-F4 (High risk)     Validated for ages 73-65

## 2024-05-12 NOTE — Patient Instructions (Addendum)
 Flu shot today  Labs today I will sign you up for home sleep test through [facility] for evaluation of possible sleep apnea Return in 4 months for follow up visit.

## 2024-05-12 NOTE — Assessment & Plan Note (Signed)
 Preventative protocols reviewed and updated unless pt declined. Discussed healthy diet and lifestyle.

## 2024-05-12 NOTE — Assessment & Plan Note (Addendum)
 Weight gain noted.  We had to drop ozempic  dose due to GI upset on 1mg  dose.  Encourage healthy diet and lifestyle choices to affect sustainable weight loss.

## 2024-05-12 NOTE — Assessment & Plan Note (Signed)
 Continued some day smoker - encouraged full cessation.

## 2024-05-12 NOTE — Assessment & Plan Note (Signed)
 Bile salt diarrhea managed with colestipol  MWF.

## 2024-05-12 NOTE — Assessment & Plan Note (Signed)
 Stable period on omeprazole  40mg  daily with pepcid  PRN

## 2024-05-12 NOTE — Assessment & Plan Note (Signed)
 L ACL/MCL injury s/p reconstruction 03/08/2024, continues recovering well.

## 2024-05-12 NOTE — Assessment & Plan Note (Signed)
 Chronic, stable. Continue current regimen.

## 2024-05-12 NOTE — Assessment & Plan Note (Addendum)
 Ongoing, intermittent but overall improved on omeprazole  + pepcid  and decreased dose of ozempic  - pending GI eva/ 07/2024.  Update labs today including lipase in GLP1 use.

## 2024-05-12 NOTE — Assessment & Plan Note (Addendum)
 At increased risk of OSA with ESS = 11 Increased neck circumference  Endorses symptoms of sleep apnea - daytime somnolence and fatigue, nonrestorative sleep, snoring.  Will order HST through [facility].s

## 2024-05-12 NOTE — Assessment & Plan Note (Signed)
 Due for colonoscopy. Upcoming GI appt early next year.

## 2024-05-12 NOTE — Progress Notes (Signed)
 Ph: [phone] Fax: [phone]   Patient ID: Cassandra [name], female    DOB: 09-May-1976, 48 y.o.   MRN: [id number]  This visit was conducted in person.  BP 120/68   Pulse 92   Temp (!) 97.5 F (36.4 C) (Oral)   Ht 5' 8 (1.727 m)   Wt 294 lb (133.4 kg)   LMP 07/10/2020   SpO2 96%   BMI 44.70 kg/m   BP Readings from Last 3 Encounters:  05/12/24 120/68  05/12/24 121/86  02/16/24 124/86   CC: CPE Subjective:   HPI: PHELAN GOERS is a 48 y.o. female presenting on 05/12/2024 for No chief complaint on file.   Sees ID yearly   S/p L knee ACL/MCL cadaveric reconstruction 03/08/2024 ([place]).   Continues using compression stockings. Has seen [facility] for CVI.   Continues working from home. 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   GERD with epigastric pain - managed with prilosec 40mg  daily with pepcid  20mg  PRN.  Transaminitis - due for rpt labs. GI appt scheduled for 07/17/2024 ([place]).  Lipase was normal 02/2024.  She notes pain worsens on days she takes ozempic  shot.  Worsened gassiness, burping, postprandial fullness.  S/p cholecystectomy 2018.   DM - metformin  intolerance. Continues ozempic  0.5mg  weekly.  HLD - on colestipol  about once a week - for loose stools/diarrhea.   Vit B12 deficiency - continues oral replacement 1000mcg daily  Saw neurology - started on Aimovig for migraines with significant improvement.   Daytime somnolence, fatigue, snorer. Some non-restorative. H/o morning headaches, not recently. No PNdyspnea, no witnessed apnea.  ESS = 11    Preventative: COLONOSCOPY WITH PROPOFOL  07/04/2018 for chronic diarrhea after cholecystectomy started on colestipol  with benefit ([place]). Thought she has IBS-D. 2 TA, rpt 5 yrs - upcoming GI appt 07/2024 Well woman with Dr. Izell - s/p hysterectomy 08/2019, pap obtained during hysterectomy CIN1 - latest pap 02/2024 normal, told no further paps needed.   Mammogram 01/2022 Birads1 @ [facility]. Upcoming appt 06/17/2023.  Flu yearly COVID vaccine Moderna 05/2020, 06/2020, no booster Tdap 12/2012, 04/2023 Pneumovax23 2016  Prevnar-20 08/2022 Seat belt use discussed   Sunscreen use discussed, no changing moles on skin.  Sleep - averaging 6-8 hours/night  Smoker - quit 2017, occ intermittent smoking with increased stress Alcohol - rare  Dentist - due  Eye exam - yearly Bowel - no constipation   Lives with husband and 2 children, no pets Occupation: call center rep Edu: some college Activity: walking regularly - recovering after recent foot surgery  Diet: 1 mt dew in am, rest water, fruits/vegetables daily      Relevant past medical, surgical, family and social history reviewed and updated as indicated. Interim medical history since our last visit reviewed. Allergies and medications reviewed and updated. Outpatient Medications Prior to Visit  Medication Sig Dispense Refill   Accu-Chek FastClix Lancets MISC 1 each by Does not apply route as directed. Use as directed to check blood sugar twice daily as needed. E11.8 102 each 3   BIKTARVY  50-200-25 MG TABS tablet Take 1 tablet by mouth at bedtime. 90 tablet 4   cetirizine (ZYRTEC) 10 MG tablet Take 10 mg by mouth at bedtime.     cyanocobalamin  (VITAMIN B12) 1000 MCG tablet Take 1 tablet (1,000 mcg total) by mouth daily.     cyclobenzaprine  (FLEXERIL ) 5 MG tablet Take 1 tablet (5 mg total) by mouth 2 (two) times daily as  needed (headache (sedation precautions)). 30 tablet 1   EPINEPHrine 0.3 mg/0.3 mL IJ SOAJ injection Inject into the muscle once as needed.     famotidine  (PEPCID ) 20 MG tablet Take 1 tablet (20 mg total) by mouth 2 (two) times daily. 60 tablet 0   glucose blood (ACCU-CHEK GUIDE) test strip 1 each by Other route as needed for other. Use as instructed to check blood sugar 100 each 0   mometasone  (ELOCON ) 0.1 % ointment Apply liberally to vulva at bedtime x 2 weeks and then  2-3x/week until seen in the office 45 g 1   amLODipine  (NORVASC ) 5 MG tablet Take 1 tablet (5 mg total) by mouth daily. 90 tablet 4   colestipol  (COLESTID ) 1 g tablet TAKE 2 TABLETS (2 G TOTAL) BY MOUTH EVERY MONDAY, WEDNESDAY, AND FRIDAY. 270 tablet 0   escitalopram  (LEXAPRO ) 20 MG tablet Take 1 tablet (20 mg total) by mouth daily. 90 tablet 4   lisinopril  (ZESTRIL ) 10 MG tablet Take 1 tablet (10 mg total) by mouth daily. 90 tablet 4   omeprazole  (PRILOSEC) 40 MG capsule TAKE 1 CAPSULE (40 MG TOTAL) BY MOUTH DAILY. FOR 3 WEEKS THEN AS NEEDED 90 capsule 0   Semaglutide ,0.25 or 0.5MG /DOS, (OZEMPIC , 0.25 OR 0.5 MG/DOSE,) 2 MG/3ML SOPN Inject 0.5 mg into the skin once a week. 3 mL 6   No facility-administered medications prior to visit.     Per HPI unless specifically indicated in ROS section below Review of Systems  Constitutional:  Negative for activity change, appetite change, chills, fatigue, fever and unexpected weight change.  HENT:  Negative for hearing loss.   Eyes:  Negative for visual disturbance.  Respiratory:  Negative for cough, chest tightness, shortness of breath and wheezing.   Cardiovascular:  Negative for chest pain, palpitations and leg swelling.  Gastrointestinal:  Positive for abdominal pain (see HPI). Negative for abdominal distention, blood in stool, constipation, diarrhea, nausea and vomiting.  Genitourinary:  Negative for difficulty urinating and hematuria.  Musculoskeletal:  Negative for arthralgias, myalgias and neck pain.  Skin:  Negative for rash.  Neurological:  Negative for dizziness, seizures, syncope and headaches.  Hematological:  Negative for adenopathy. Does not bruise/bleed easily.  Psychiatric/Behavioral:  Negative for dysphoric mood. The patient is not nervous/anxious.     Objective:  BP 120/68   Pulse 92   Temp (!) 97.5 F (36.4 C) (Oral)   Ht 5' 8 (1.727 m)   Wt 294 lb (133.4 kg)   LMP 07/10/2020   SpO2 96%   BMI 44.70 kg/m   Wt Readings  from Last 3 Encounters:  05/12/24 294 lb (133.4 kg)  05/12/24 298 lb 6.4 oz (135.4 kg)  02/16/24 280 lb (127 kg)      Physical Exam Vitals and nursing note reviewed.  Constitutional:      Appearance: Normal appearance. She is not ill-appearing.  HENT:     Head: Normocephalic and atraumatic.     Right Ear: Tympanic membrane, ear canal and external ear normal. There is no impacted cerumen.     Left Ear: Tympanic membrane, ear canal and external ear normal. There is no impacted cerumen.     Mouth/Throat:     Mouth: Mucous membranes are moist.     Pharynx: Oropharynx is clear. No oropharyngeal exudate or posterior oropharyngeal erythema.  Eyes:     General:        Right eye: No discharge.        Left eye: No discharge.  Extraocular Movements: Extraocular movements intact.     Conjunctiva/sclera: Conjunctivae normal.     Pupils: Pupils are equal, round, and reactive to light.  Neck:     Thyroid : No thyroid  mass or thyromegaly.     Comments: Neck circ: 39.5 cm  Cardiovascular:     Rate and Rhythm: Normal rate and regular rhythm.     Pulses: Normal pulses.     Heart sounds: Normal heart sounds. No murmur heard. Pulmonary:     Effort: Pulmonary effort is normal. No respiratory distress.     Breath sounds: Normal breath sounds. No wheezing, rhonchi or rales.  Abdominal:     General: Bowel sounds are normal. There is no distension.     Palpations: Abdomen is soft. There is no mass.     Tenderness: There is no abdominal tenderness. There is no guarding or rebound.     Hernia: No hernia is present.  Musculoskeletal:     Cervical back: Normal range of motion and neck supple. No rigidity.     Right lower leg: No edema.     Left lower leg: No edema.     Comments: Wearing left knee brace  Lymphadenopathy:     Cervical: No cervical adenopathy.  Skin:    General: Skin is warm and dry.     Findings: No rash.  Neurological:     General: No focal deficit present.     Mental Status:  She is alert. Mental status is at baseline.  Psychiatric:        Mood and Affect: Mood normal.        Behavior: Behavior normal.       Results for orders placed or performed in visit on 05/12/24  HgB A1c   Collection Time: 05/12/24 12:24 PM  Result Value Ref Range   Hemoglobin A1C 5.5 4.0 - 5.6 %   HbA1c POC (<> result, manual entry)     HbA1c, POC (prediabetic range)     HbA1c, POC (controlled diabetic range)     Lab Results  Component Value Date   WBC 7.3 02/18/2024   HGB 14.2 02/18/2024   HCT 41.7 02/18/2024   MCV 98.8 02/18/2024   PLT 306 02/18/2024    Lab Results  Component Value Date   ALT 117 (H) 02/18/2024   AST 67 (H) 02/18/2024   ALKPHOS 67 11/02/2023   BILITOT 0.5 02/18/2024    Lab Results  Component Value Date   NA 140 02/18/2024   CL 103 02/18/2024   K 4.5 02/18/2024   CO2 26 02/18/2024   BUN 10 02/18/2024   CREATININE 1.12 (H) 02/18/2024   EGFR 61 02/18/2024   CALCIUM  9.7 02/18/2024   ALBUMIN  4.1 11/02/2023   GLUCOSE 98 02/18/2024     Assessment & Plan:   Problem List Items Addressed This Visit     Encounter for general adult medical examination with abnormal findings - Primary (Chronic)   Preventative protocols reviewed and updated unless pt declined. Discussed healthy diet and lifestyle.       Type 2 diabetes mellitus with other specified complication (HCC)   Remains well controlled. Continue ozempic  0.5mg  weekly.  Dose titration limited by GI upset. Update lipase.  Await GI eval.       Relevant Medications   lisinopril  (ZESTRIL ) 10 MG tablet   Semaglutide ,0.25 or 0.5MG /DOS, (OZEMPIC , 0.25 OR 0.5 MG/DOSE,) 2 MG/3ML SOPN   Other Relevant Orders   HgB A1c (Completed)   Microalbumin / creatinine urine ratio  Basic metabolic panel with GFR   Vitamin B12   Lipid panel   Ambulatory referral to Sleep Studies   MDD (major depressive disorder), recurrent episode, moderate (HCC)   Chronic, stable period on lexapro  - continue.        Relevant Medications   escitalopram  (LEXAPRO ) 20 MG tablet   Body mass index (BMI) of 40.0 to 44.9 in adult (HCC)   Weight gain noted.  We had to drop ozempic  dose due to GI upset on 1mg  dose.  Encourage healthy diet and lifestyle choices to affect sustainable weight loss.       Relevant Medications   Semaglutide ,0.25 or 0.5MG /DOS, (OZEMPIC , 0.25 OR 0.5 MG/DOSE,) 2 MG/3ML SOPN   Other Relevant Orders   Ambulatory referral to Sleep Studies   Ex-smoker   Continued some day smoker - encouraged full cessation.       Chronic venous insufficiency of lower extremity   Continue compression stocking use.       Relevant Medications   amLODipine  (NORVASC ) 5 MG tablet   colestipol  (COLESTID ) 1 g tablet   lisinopril  (ZESTRIL ) 10 MG tablet   HIV (human immunodeficiency virus infection) (HCC)   On biktarvy , appreciate [facility] ID care.       Hypertension   Chronic, stable. Continue current regimen.       Relevant Medications   amLODipine  (NORVASC ) 5 MG tablet   colestipol  (COLESTID ) 1 g tablet   lisinopril  (ZESTRIL ) 10 MG tablet   Other Relevant Orders   Ambulatory referral to Sleep Studies   Chronic diarrhea   Bile salt diarrhea managed with colestipol  MWF.       History of adenomatous polyp of colon   Due for colonoscopy. Upcoming GI appt early next year.       GERD (gastroesophageal reflux disease)   Stable period on omeprazole  40mg  daily with pepcid  PRN      Relevant Medications   omeprazole  (PRILOSEC) 40 MG capsule   Vitamin B12 deficiency   Continue b12 daily replacement, update levels.       Relevant Orders   Vitamin B12   Migraine   Saw [facility] neurology, now on aimovig with improvement.       Relevant Medications   amLODipine  (NORVASC ) 5 MG tablet   colestipol  (COLESTID ) 1 g tablet   escitalopram  (LEXAPRO ) 20 MG tablet   lisinopril  (ZESTRIL ) 10 MG tablet   Epigastric pain   Ongoing, intermittent but overall improved on omeprazole  + pepcid  and  decreased dose of ozempic  - pending GI eva/ 07/2024.  Update labs today including lipase in GLP1 use.       Relevant Orders   Lipase   Injury of left knee   L ACL/MCL injury s/p reconstruction 03/08/2024, continues recovering well.       Transaminitis   Update labwork today.  Recent RUQ US  showing hepatic steatosis, s/p cholcystectomy.   Fibrosis 4 Score = .93 (Low risk)      Interpretation for patients with NAFLD          <1.30       -  F0-F1 (Low risk)          1.30-2.67 -  Indeterminate           >2.67      -  F3-F4 (High risk)     Validated for ages 65-65      Relevant Orders   TSH   IBC panel   Daytime somnolence   At increased risk of OSA  with ESS = 11 Increased neck circumference  Endorses symptoms of sleep apnea - daytime somnolence and fatigue, nonrestorative sleep, snoring.  Will order HST through [facility]       Relevant Orders   Ambulatory referral to Sleep Studies   Other Visit Diagnoses       Need for influenza vaccination       Relevant Orders   Flu vaccine trivalent PF, 6mos and older(Flulaval,Afluria,Fluarix,Fluzone) (Completed)        Meds ordered this encounter  Medications   amLODipine  (NORVASC ) 5 MG tablet    Sig: Take 1 tablet (5 mg total) by mouth daily.    Dispense:  90 tablet    Refill:  3   colestipol  (COLESTID ) 1 g tablet    Sig: Take 2 tablets (2 g total) by mouth every Monday, Wednesday, and Friday.    Dispense:  90 tablet    Refill:  3   escitalopram  (LEXAPRO ) 20 MG tablet    Sig: Take 1 tablet (20 mg total) by mouth daily.    Dispense:  90 tablet    Refill:  3   lisinopril  (ZESTRIL ) 10 MG tablet    Sig: Take 1 tablet (10 mg total) by mouth daily.    Dispense:  90 tablet    Refill:  3   omeprazole  (PRILOSEC) 40 MG capsule    Sig: Take 1 capsule (40 mg total) by mouth daily.    Dispense:  90 capsule    Refill:  3   Semaglutide ,0.25 or 0.5MG /DOS, (OZEMPIC , 0.25 OR 0.5 MG/DOSE,) 2 MG/3ML SOPN    Sig: Inject 0.5 mg  into the skin once a week.    Dispense:  3 mL    Refill:  11    Orders Placed This Encounter  Procedures   Flu vaccine trivalent PF, 6mos and older(Flulaval,Afluria,Fluarix,Fluzone)   Microalbumin / creatinine urine ratio   Basic metabolic panel with GFR   Vitamin B12   Lipid panel   Lipase   TSH   IBC panel   Ambulatory referral to Sleep Studies    Referral Priority:   Routine    Referral Type:   Consultation    Referral Reason:   Specialty Services Required    Number of Visits Requested:   1   HgB A1c    Patient Instructions  Flu shot today  Labs today I will sign you up for home sleep test through [facility] for evaluation of possible sleep apnea Return in 4 months for follow up visit.   Follow up plan: Return in about 4 months (around 09/10/2024) for follow up visit.  Anton Blas, MD

## 2024-05-13 LAB — HEPATITIS PANEL, ACUTE
Hep A IgM: NONREACTIVE
Hep B C IgM: NONREACTIVE
Hepatitis B Surface Ag: NONREACTIVE
Hepatitis C Ab: NONREACTIVE

## 2024-05-15 LAB — CERVICOVAGINAL ANCILLARY ONLY
Bacterial Vaginitis (gardnerella): POSITIVE — AB
Candida Glabrata: NEGATIVE
Candida Vaginitis: NEGATIVE
Comment: NEGATIVE
Comment: NEGATIVE
Comment: NEGATIVE

## 2024-05-16 ENCOUNTER — Ambulatory Visit: Payer: Self-pay | Admitting: Obstetrics and Gynecology

## 2024-05-16 MED ORDER — VITAMIN B-12 1000 MCG PO TABS: 1000.0000 ug | ORAL_TABLET | ORAL | Status: AC

## 2024-05-16 MED ORDER — METRONIDAZOLE 0.75 % VA GEL: 1.0000 | Freq: Every day | VAGINAL | 0 refills | Status: AC

## 2024-05-16 NOTE — Addendum Note (Signed)
 Addended by: [name] on: 05/16/2024 10:07 AM   Modules accepted: Orders

## 2024-05-31 DIAGNOSIS — S83242D Other tear of medial meniscus, current injury, left knee, subsequent encounter: Secondary | ICD-10-CM | POA: Diagnosis not present

## 2024-06-07 DIAGNOSIS — S83242D Other tear of medial meniscus, current injury, left knee, subsequent encounter: Secondary | ICD-10-CM | POA: Diagnosis not present

## 2024-06-13 ENCOUNTER — Telehealth: Payer: Self-pay | Admitting: Family Medicine

## 2024-06-13 NOTE — Telephone Encounter (Signed)
 Plz notify - received notice from [employer] that they have approved HST through [facility]. This has been approved through 08/05/2024 - ie she needs to schedule before then.

## 2024-06-14 NOTE — Telephone Encounter (Signed)
 Called patient she has received and completed test.  No further action needed at this time.

## 2024-06-16 ENCOUNTER — Encounter

## 2024-06-26 ENCOUNTER — Encounter: Payer: Self-pay | Admitting: Family Medicine

## 2024-06-26 ENCOUNTER — Ambulatory Visit: Payer: Self-pay | Admitting: Family Medicine

## 2024-06-26 DIAGNOSIS — G4733 Obstructive sleep apnea (adult) (pediatric): Secondary | ICD-10-CM

## 2024-06-28 NOTE — Telephone Encounter (Signed)
 This was scanned in as an overnight oximetry pulse test but it was a home sleep test - can we change or rescan with correct name/into correct location in chart? (Procedures section)

## 2024-07-10 ENCOUNTER — Encounter

## 2024-08-11 ENCOUNTER — Encounter

## 2024-08-31 ENCOUNTER — Ambulatory Visit: Admitting: Infectious Diseases

## 2024-09-11 ENCOUNTER — Ambulatory Visit: Admitting: Family Medicine
